# Patient Record
Sex: Male | Born: 1935 | Hispanic: No | Marital: Married | Smoking: Former smoker
Health system: Southern US, Community
[De-identification: ages and names within clinical notes are randomized; demographics above are authoritative.]

## PROBLEM LIST (undated history)

## (undated) ENCOUNTER — Emergency Department (HOSPITAL_COMMUNITY): Admission: EM | Discharge status: Absent without leave | Payer: Medicare Other

## (undated) DIAGNOSIS — M1990 Unspecified osteoarthritis, unspecified site: Secondary | ICD-10-CM

## (undated) DIAGNOSIS — G459 Transient cerebral ischemic attack, unspecified: Secondary | ICD-10-CM

## (undated) DIAGNOSIS — G4733 Obstructive sleep apnea (adult) (pediatric): Secondary | ICD-10-CM

## (undated) DIAGNOSIS — J449 Chronic obstructive pulmonary disease, unspecified: Secondary | ICD-10-CM

## (undated) DIAGNOSIS — J310 Chronic rhinitis: Secondary | ICD-10-CM

## (undated) DIAGNOSIS — B029 Zoster without complications: Secondary | ICD-10-CM

## (undated) DIAGNOSIS — E119 Type 2 diabetes mellitus without complications: Secondary | ICD-10-CM

## (undated) DIAGNOSIS — I2510 Atherosclerotic heart disease of native coronary artery without angina pectoris: Secondary | ICD-10-CM

## (undated) DIAGNOSIS — H903 Sensorineural hearing loss, bilateral: Secondary | ICD-10-CM

## (undated) DIAGNOSIS — J302 Other seasonal allergic rhinitis: Secondary | ICD-10-CM

## (undated) DIAGNOSIS — K219 Gastro-esophageal reflux disease without esophagitis: Secondary | ICD-10-CM

## (undated) DIAGNOSIS — K5790 Diverticulosis of intestine, part unspecified, without perforation or abscess without bleeding: Secondary | ICD-10-CM

## (undated) DIAGNOSIS — E785 Hyperlipidemia, unspecified: Secondary | ICD-10-CM

## (undated) DIAGNOSIS — K449 Diaphragmatic hernia without obstruction or gangrene: Secondary | ICD-10-CM

## (undated) DIAGNOSIS — N400 Enlarged prostate without lower urinary tract symptoms: Secondary | ICD-10-CM

## (undated) DIAGNOSIS — I639 Cerebral infarction, unspecified: Secondary | ICD-10-CM

## (undated) DIAGNOSIS — N3281 Overactive bladder: Secondary | ICD-10-CM

## (undated) DIAGNOSIS — I10 Essential (primary) hypertension: Secondary | ICD-10-CM

## (undated) DIAGNOSIS — D649 Anemia, unspecified: Secondary | ICD-10-CM

## (undated) HISTORY — DX: Unspecified osteoarthritis, unspecified site: M19.90

## (undated) HISTORY — DX: Diverticulosis of intestine, part unspecified, without perforation or abscess without bleeding: K57.90

## (undated) HISTORY — DX: Anemia, unspecified: D64.9

## (undated) HISTORY — PX: JOINT REPLACEMENT: SHX530

## (undated) HISTORY — DX: Atherosclerotic heart disease of native coronary artery without angina pectoris: I25.10

## (undated) HISTORY — DX: Chronic rhinitis: J31.0

## (undated) HISTORY — DX: Hyperlipidemia, unspecified: E78.5

## (undated) HISTORY — DX: Zoster without complications: B02.9

## (undated) HISTORY — PX: BACK SURGERY: SHX140

## (undated) HISTORY — DX: Obstructive sleep apnea (adult) (pediatric): G47.33

## (undated) HISTORY — DX: Overactive bladder: N32.81

## (undated) HISTORY — DX: Diaphragmatic hernia without obstruction or gangrene: K44.9

## (undated) HISTORY — DX: Benign prostatic hyperplasia without lower urinary tract symptoms: N40.0

## (undated) HISTORY — PX: OTHER SURGICAL HISTORY: SHX169

## (undated) HISTORY — DX: Type 2 diabetes mellitus without complications: E11.9

## (undated) HISTORY — DX: Chronic obstructive pulmonary disease, unspecified: J44.9

## (undated) HISTORY — DX: Sensorineural hearing loss, bilateral: H90.3

## (undated) HISTORY — DX: Transient cerebral ischemic attack, unspecified: G45.9

## (undated) HISTORY — DX: Gastro-esophageal reflux disease without esophagitis: K21.9

## (undated) HISTORY — DX: Other seasonal allergic rhinitis: J30.2

## (undated) HISTORY — PX: INNER EAR SURGERY: SHX679

## (undated) HISTORY — DX: Essential (primary) hypertension: I10

## (undated) HISTORY — DX: Cerebral infarction, unspecified: I63.9

---

## 2000-02-16 ENCOUNTER — Encounter: Admission: RE | Admit: 2000-02-16 | Discharge: 2000-02-16 | Payer: Self-pay | Admitting: Family Medicine

## 2000-02-16 ENCOUNTER — Encounter: Payer: Self-pay | Admitting: Family Medicine

## 2001-11-26 ENCOUNTER — Ambulatory Visit (HOSPITAL_COMMUNITY): Admission: RE | Admit: 2001-11-26 | Discharge: 2001-11-26 | Payer: Self-pay | Admitting: Orthopedic Surgery

## 2002-05-02 HISTORY — PX: OTHER SURGICAL HISTORY: SHX169

## 2002-08-19 ENCOUNTER — Ambulatory Visit: Admission: RE | Admit: 2002-08-19 | Discharge: 2002-08-19 | Payer: Self-pay | Admitting: Gastroenterology

## 2002-08-19 ENCOUNTER — Encounter: Payer: Self-pay | Admitting: Gastroenterology

## 2002-09-19 ENCOUNTER — Encounter: Admission: RE | Admit: 2002-09-19 | Discharge: 2002-09-19 | Payer: Self-pay | Admitting: Internal Medicine

## 2002-09-19 ENCOUNTER — Encounter (HOSPITAL_BASED_OUTPATIENT_CLINIC_OR_DEPARTMENT_OTHER): Payer: Self-pay | Admitting: Internal Medicine

## 2002-10-14 ENCOUNTER — Encounter: Payer: Self-pay | Admitting: Neurosurgery

## 2002-10-16 ENCOUNTER — Inpatient Hospital Stay (HOSPITAL_COMMUNITY): Admission: RE | Admit: 2002-10-16 | Discharge: 2002-10-18 | Payer: Self-pay | Admitting: Neurosurgery

## 2002-10-16 ENCOUNTER — Encounter: Payer: Self-pay | Admitting: Neurosurgery

## 2002-12-16 ENCOUNTER — Encounter (INDEPENDENT_AMBULATORY_CARE_PROVIDER_SITE_OTHER): Payer: Self-pay | Admitting: *Deleted

## 2002-12-16 ENCOUNTER — Ambulatory Visit (HOSPITAL_COMMUNITY): Admission: RE | Admit: 2002-12-16 | Discharge: 2002-12-16 | Payer: Self-pay | Admitting: Gastroenterology

## 2003-07-01 DIAGNOSIS — I2510 Atherosclerotic heart disease of native coronary artery without angina pectoris: Secondary | ICD-10-CM

## 2003-07-01 HISTORY — DX: Atherosclerotic heart disease of native coronary artery without angina pectoris: I25.10

## 2003-07-01 HISTORY — PX: CORONARY ANGIOPLASTY WITH STENT PLACEMENT: SHX49

## 2003-07-30 ENCOUNTER — Inpatient Hospital Stay (HOSPITAL_COMMUNITY): Admission: AD | Admit: 2003-07-30 | Discharge: 2003-08-01 | Payer: Self-pay | Admitting: Cardiovascular Disease

## 2004-05-02 HISTORY — PX: OTHER SURGICAL HISTORY: SHX169

## 2004-08-23 ENCOUNTER — Inpatient Hospital Stay (HOSPITAL_COMMUNITY): Admission: RE | Admit: 2004-08-23 | Discharge: 2004-08-27 | Payer: Self-pay | Admitting: Orthopedic Surgery

## 2004-12-31 ENCOUNTER — Encounter: Admission: RE | Admit: 2004-12-31 | Discharge: 2004-12-31 | Payer: Self-pay | Admitting: Internal Medicine

## 2006-07-12 ENCOUNTER — Ambulatory Visit: Payer: Self-pay | Admitting: Pulmonary Disease

## 2006-07-17 ENCOUNTER — Ambulatory Visit: Payer: Self-pay | Admitting: Pulmonary Disease

## 2006-07-17 ENCOUNTER — Ambulatory Visit (HOSPITAL_BASED_OUTPATIENT_CLINIC_OR_DEPARTMENT_OTHER): Admission: RE | Admit: 2006-07-17 | Discharge: 2006-07-17 | Payer: Self-pay | Admitting: Pulmonary Disease

## 2006-08-01 ENCOUNTER — Ambulatory Visit: Payer: Self-pay | Admitting: Pulmonary Disease

## 2006-08-02 ENCOUNTER — Ambulatory Visit: Payer: Self-pay | Admitting: Pulmonary Disease

## 2006-08-17 ENCOUNTER — Encounter (HOSPITAL_COMMUNITY): Admission: RE | Admit: 2006-08-17 | Discharge: 2006-11-15 | Payer: Self-pay | Admitting: Pulmonary Disease

## 2006-09-18 ENCOUNTER — Ambulatory Visit: Payer: Self-pay | Admitting: Pulmonary Disease

## 2006-11-09 ENCOUNTER — Ambulatory Visit: Payer: Self-pay | Admitting: Pulmonary Disease

## 2006-12-01 DIAGNOSIS — G459 Transient cerebral ischemic attack, unspecified: Secondary | ICD-10-CM

## 2006-12-01 HISTORY — DX: Transient cerebral ischemic attack, unspecified: G45.9

## 2006-12-06 ENCOUNTER — Encounter (HOSPITAL_BASED_OUTPATIENT_CLINIC_OR_DEPARTMENT_OTHER): Payer: Self-pay | Admitting: Internal Medicine

## 2006-12-06 ENCOUNTER — Inpatient Hospital Stay (HOSPITAL_COMMUNITY): Admission: AD | Admit: 2006-12-06 | Discharge: 2006-12-08 | Payer: Self-pay | Admitting: Internal Medicine

## 2006-12-07 ENCOUNTER — Encounter (HOSPITAL_BASED_OUTPATIENT_CLINIC_OR_DEPARTMENT_OTHER): Payer: Self-pay | Admitting: Internal Medicine

## 2007-01-17 ENCOUNTER — Ambulatory Visit: Payer: Self-pay | Admitting: Pulmonary Disease

## 2007-03-13 DIAGNOSIS — J449 Chronic obstructive pulmonary disease, unspecified: Secondary | ICD-10-CM | POA: Insufficient documentation

## 2007-03-13 DIAGNOSIS — G4733 Obstructive sleep apnea (adult) (pediatric): Secondary | ICD-10-CM | POA: Insufficient documentation

## 2007-04-02 ENCOUNTER — Ambulatory Visit: Payer: Self-pay | Admitting: Pulmonary Disease

## 2007-04-05 ENCOUNTER — Inpatient Hospital Stay (HOSPITAL_COMMUNITY): Admission: EM | Admit: 2007-04-05 | Discharge: 2007-04-06 | Payer: Self-pay | Admitting: Emergency Medicine

## 2007-07-16 ENCOUNTER — Ambulatory Visit: Payer: Self-pay | Admitting: Pulmonary Disease

## 2008-01-11 ENCOUNTER — Ambulatory Visit: Payer: Self-pay | Admitting: Pulmonary Disease

## 2009-03-31 DIAGNOSIS — D509 Iron deficiency anemia, unspecified: Secondary | ICD-10-CM | POA: Diagnosis present

## 2009-03-31 DIAGNOSIS — E782 Mixed hyperlipidemia: Secondary | ICD-10-CM | POA: Diagnosis present

## 2009-03-31 DIAGNOSIS — K219 Gastro-esophageal reflux disease without esophagitis: Secondary | ICD-10-CM | POA: Insufficient documentation

## 2009-06-09 ENCOUNTER — Ambulatory Visit: Payer: Self-pay | Admitting: Pulmonary Disease

## 2009-09-10 ENCOUNTER — Inpatient Hospital Stay (HOSPITAL_COMMUNITY): Admission: EM | Admit: 2009-09-10 | Discharge: 2009-09-12 | Payer: Self-pay | Admitting: Emergency Medicine

## 2009-09-11 ENCOUNTER — Encounter (INDEPENDENT_AMBULATORY_CARE_PROVIDER_SITE_OTHER): Payer: Self-pay | Admitting: Gastroenterology

## 2009-12-04 ENCOUNTER — Ambulatory Visit: Payer: Self-pay | Admitting: Surgery

## 2009-12-04 ENCOUNTER — Ambulatory Visit: Payer: Self-pay | Admitting: Pulmonary Disease

## 2010-05-27 ENCOUNTER — Ambulatory Visit: Payer: Self-pay | Admitting: Cardiovascular Disease

## 2010-06-01 NOTE — Assessment & Plan Note (Signed)
Summary: 6 month f/u ///kp   Primary Provider/Referring Provider:  Dr. Leanna Battles  CC:  6 month follow up, pt states breathing is doing good. pt states he has no cough, pt has no complaints today, Pt states he uses his cpap 2-3 days out of the week, 5-6 hours a night, and pt states he has no problems with cpap machine.  History of Present Illness: I saw [name] in f/u today for his severe COPD and moderate OSA on CPAP 7 cm H2O.  His breathing has been stable.  He has occasional cough with clear sputum.  He does not have much wheeze.  He still gets winded with strenuous exertion or bending.  He denies chest pain, fever, or leg swelling.  His breathing gets worse in hot weather.  He uses albuterol two times a day.  He remains on prednisone 2.5 mg once daily.  He has not been using CPAP on a consistent basis. He wants to know if he is a candidate for an oral appliance.  Preventive Screening-Counseling & Management  Alcohol-Tobacco     Smoking Status: quit     Packs/Day: 1.0     Year Started: 1945     Year Quit: 1990  Current Medications (verified): 1)  Symbicort 160-4.5 Mcg/act  Aero (Budesonide-Formoterol Fumarate) .... 2 Inhalations Two Times A Day 2)  Spiriva Handihaler 18 Mcg  Caps (Tiotropium Bromide Monohydrate) .Marland Kitchen.. 1 Capsule Once Daily 3)  Singulair 10 Mg  Tabs (Montelukast Sodium) .... Take 1 Tablet By Mouth Once A Day 4)  Prednisone 5 Mg  Tabs (Prednisone) .... 1/2 By Mouth Daily 5)  Theophylline Cr 200 Mg  Tb12 (Theophylline) .... Take 1 Tablet By Mouth Once A Day 6)  Loratadine 10 Mg  Tabs (Loratadine) .... Take 1 Tablet By Mouth Once A Day 7)  Albuterol Sulfate (2.5 Mg/42ml) 0.083%  Nebu (Albuterol Sulfate) .Marland Kitchen.. 1 Vial Two Times A Day 8)  Actos 15 Mg  Tabs (Pioglitazone Hcl) .... Take 1 Tablet By Mouth Once A Day 9)  Lipitor 20 Mg  Tabs (Atorvastatin Calcium) .... Take 1 Tablet By Mouth Once A Day 10)  Cardizem La 180 Mg  Tb24 (Diltiazem Hcl Coated Beads) .... Take 1  Tablet By Mouth Once A Day 11)  Furosemide 20 Mg  Tabs (Furosemide) .... Take 1 Tablet By Mouth Once A Day 12)  Ferrex 150 150 Mg  Caps (Polysaccharide Iron Complex) .... 2 Tabs Two Times A Day 13)  Metformin Hcl 500 Mg  Tb24 (Metformin Hcl) .... Take 1 Tablet By Mouth Once A Day 14)  Adult Aspirin Low Strength 81 Mg  Tbdp (Aspirin) .... Take 1 Tablet By Mouth Once A Day 15)  Meloxicam 15 Mg  Tabs (Meloxicam) .... Take 1 Tablet By Mouth Once A Day 16)  Nitrostat 0.4 Mg  Subl (Nitroglycerin) .... As Needed 17)  Plavix 75 Mg  Tabs (Clopidogrel Bisulfate) .Marland Kitchen.. 1 By Mouth Daily 18)  Micardis 80 Mg Tabs (Telmisartan) .Marland Kitchen.. 1 By Mouth Daily 19)  Centrum Silver  Tabs (Multiple Vitamins-Minerals) .Marland Kitchen.. 1 By Mouth Daily 20)  Pantoprazole Sodium 40 Mg Tbec (Pantoprazole Sodium) .Marland Kitchen.. 1 By Mouth Daily 21)  Vitamin C 500 Mg Tabs (Ascorbic Acid) .Marland Kitchen.. 1 By Mouth Daily 22)  Flomax 0.4 Mg Caps (Tamsulosin Hcl) .... Once Daily  Allergies: 1)  ! Sulfa  Past History:  Past Medical History: Reviewed history from 06/09/2009 and no changes required. TIA 08/08, Dr. Antony Contras OSA, PSG 03/08 AHI 23  COPD      - PFT 07/31/05 FEV1 1.30(54%), FVC 2.89(79%), FEV1% 45, TLC 4.98(91%), DLCO 55% CAD, s/p stent 2005, Dr. Mertie Moores  Diastolic dysfunction, Echo 07/08 EF 55 to 60% Rhinitis HTN DM DJD Dyslipidemia Hiatal hernia Hemorrhoids Diverticulosis Overactive bladder  Past Surgical History: Reviewed history from 01/11/2008 and no changes required. L5 laminectomy Dr. Sherwood Gambler 2004 Right total knee 2006, Dr. Wynelle Link  Social History: Packs/Day:  1.0  Vital Signs:  Patient profile:   77 year old male Height:      68 inches O2 Sat:      94 % on Room air Pulse rate:   91 / minute Resp:     97.7 per minute BP sitting:   122 / 48  Vitals Entered By: Charma Igo (December 04, 2009 1:41 PM)  O2 Flow:  Room air CC: 6 month follow up, pt states breathing is doing good. pt states he has no cough, pt has  no complaints today, Pt states he uses his cpap 2-3 days out of the week, 5-6 hours a night, pt states he has no problems with cpap machine Comments meds and allergies updated Phone number updated Charma Igo  December 04, 2009 1:42 PM    Physical Exam  General:  normal appearance and thin.   Nose:  no deformity, discharge, inflammation, or lesions Mouth:  no deformity or lesions Neck:  no LAN Lungs:  prolonged exhalation, no wheezing Heart:  regular rhythm and normal rate.   Extremities:  no edema   Impression & Recommendations:  Problem # 1:  CHRONIC OBSTRUCTIVE PULMONARY DISEASE, SEVERE (ICD-496) He is stable on his current regimen.  Will see if he can taper off prednisone.  If he has more problems with his breathing off prednisone, then I have advised he could try using daliresp (roflumilast) first.  He is to continue his other inhaler regimen.   Problem # 2:  OBSTRUCTIVE SLEEP APNEA (ICD-327.23) He has not been able to adjust to CPAP therapy.  He will check with his dentist to see if he is a candidate for an oral appliance.  I advised him that he will need to have further sleep testing after he is fitted for his oral appliance to document efficacy of the device.  Medications Added to Medication List This Visit: 1)  Prednisone 5 Mg Tabs (Prednisone) .... 1/2 by mouth every other day for 2 weeks, then stop prednisone 2)  Flomax 0.4 Mg Caps (Tamsulosin hcl) .... Once daily  Complete Medication List: 1)  Symbicort 160-4.5 Mcg/act Aero (Budesonide-formoterol fumarate) .... 2 inhalations two times a day 2)  Spiriva Handihaler 18 Mcg Caps (Tiotropium bromide monohydrate) .Marland Kitchen.. 1 capsule once daily 3)  Singulair 10 Mg Tabs (Montelukast sodium) .... Take 1 tablet by mouth once a day 4)  Prednisone 5 Mg Tabs (Prednisone) .... 1/2 by mouth every other day for 2 weeks, then stop prednisone 5)  Theophylline Cr 200 Mg Tb12 (Theophylline) .... Take 1 tablet by mouth once a day 6)  Loratadine 10  Mg Tabs (Loratadine) .... Take 1 tablet by mouth once a day 7)  Albuterol Sulfate (2.5 Mg/92ml) 0.083% Nebu (Albuterol sulfate) .Marland Kitchen.. 1 vial two times a day 8)  Actos 15 Mg Tabs (Pioglitazone hcl) .... Take 1 tablet by mouth once a day 9)  Lipitor 20 Mg Tabs (Atorvastatin calcium) .... Take 1 tablet by mouth once a day 10)  Cardizem La 180 Mg Tb24 (Diltiazem hcl coated beads) .... Take 1  tablet by mouth once a day 11)  Furosemide 20 Mg Tabs (Furosemide) .... Take 1 tablet by mouth once a day 12)  Ferrex 150 150 Mg Caps (Polysaccharide iron complex) .... 2 tabs two times a day 13)  Metformin Hcl 500 Mg Tb24 (Metformin hcl) .... Take 1 tablet by mouth once a day 14)  Adult Aspirin Low Strength 81 Mg Tbdp (Aspirin) .... Take 1 tablet by mouth once a day 15)  Meloxicam 15 Mg Tabs (Meloxicam) .... Take 1 tablet by mouth once a day 16)  Nitrostat 0.4 Mg Subl (Nitroglycerin) .... As needed 17)  Plavix 75 Mg Tabs (Clopidogrel bisulfate) .Marland Kitchen.. 1 by mouth daily 18)  Micardis 80 Mg Tabs (Telmisartan) .Marland Kitchen.. 1 by mouth daily 19)  Centrum Silver Tabs (Multiple vitamins-minerals) .Marland Kitchen.. 1 by mouth daily 20)  Pantoprazole Sodium 40 Mg Tbec (Pantoprazole sodium) .Marland Kitchen.. 1 by mouth daily 21)  Vitamin C 500 Mg Tabs (Ascorbic acid) .Marland Kitchen.. 1 by mouth daily 22)  Flomax 0.4 Mg Caps (Tamsulosin hcl) .... Once daily  Other Orders: Est. Patient Level III [id number])  Patient Instructions: 1)  check with your dentist about an oral appliance (mandibular advancement device) for sleep apnea 2)  Change prednisone to 2.5 mg every other day for 2 weeks, and if okay then stop prednisone 3)  Will decide if you are a candidate for Daliresp for treatment of you COPD 4)  Follow up in 6 months

## 2010-06-01 NOTE — Assessment & Plan Note (Signed)
Summary: rov/apc   Primary Provider/Referring Provider:  Dr. Leanna Battles  CC:  COPD. OSA. The patient states he is having no breathing problems..  History of Present Illness: I saw [name] in f/u today for his severe COPD and moderate OSA on CPAP 7 cm H2O.  I have not seen [name] since 2009.  He continues on his regimen of prednisone and theophylline with his inhalers.  He has been doing reasonably well.  He has occasional cough with clear sputum.  He gets occasional wheeze.  His breathing is usually not much of a problem, but he will get winded with heavy exertion.  He uses his CPAP a few days per week.  He will sometimes forget to use it, and will sometimes take it off in the middle of the night.  He is using a full face mask.  He sometimes feels the pressure is too much.   Current Medications (verified): 1)  Symbicort 160-4.5 Mcg/act  Aero (Budesonide-Formoterol Fumarate) .... 2 Inhalations Two Times A Day 2)  Spiriva Handihaler 18 Mcg  Caps (Tiotropium Bromide Monohydrate) .Marland Kitchen.. 1 Capsule Once Daily 3)  Singulair 10 Mg  Tabs (Montelukast Sodium) .... Take 1 Tablet By Mouth Once A Day 4)  Loratadine 10 Mg  Tabs (Loratadine) .... Take 1 Tablet By Mouth Once A Day 5)  Albuterol Sulfate (2.5 Mg/33ml) 0.083%  Nebu (Albuterol Sulfate) .Marland Kitchen.. 1 Vial Two Times A Day 6)  Theophylline Cr 200 Mg  Tb12 (Theophylline) .... Take 1 Tablet By Mouth Once A Day 7)  Prednisone 5 Mg  Tabs (Prednisone) .... 1/2 By Mouth Daily 8)  Actos 15 Mg  Tabs (Pioglitazone Hcl) .... Take 1 Tablet By Mouth Once A Day 9)  Lipitor 20 Mg  Tabs (Atorvastatin Calcium) .... Take 1 Tablet By Mouth Once A Day 10)  Cardizem La 180 Mg  Tb24 (Diltiazem Hcl Coated Beads) .... Take 1 Tablet By Mouth Once A Day 11)  Furosemide 20 Mg  Tabs (Furosemide) .... Take 1 Tablet By Mouth Once A Day 12)  Ferrex 150 150 Mg  Caps (Polysaccharide Iron Complex) .... 2 Tabs Two Times A Day 13)  Metformin Hcl 500 Mg  Tb24 (Metformin Hcl) ....  Take 1 Tablet By Mouth Once A Day 14)  Adult Aspirin Low Strength 81 Mg  Tbdp (Aspirin) .... Take 1 Tablet By Mouth Once A Day 15)  Meloxicam 15 Mg  Tabs (Meloxicam) .... Take 1 Tablet By Mouth Once A Day 16)  Metoclopramide Hcl 5 Mg  Tabs (Metoclopramide Hcl) .... Take 1 Tab By Mouth At Bedtime 17)  Nitrostat 0.4 Mg  Subl (Nitroglycerin) .... As Needed 18)  Plavix 75 Mg  Tabs (Clopidogrel Bisulfate) .Marland Kitchen.. 1 By Mouth Daily 19)  Micardis 80 Mg Tabs (Telmisartan) .Marland Kitchen.. 1 By Mouth Daily 20)  Centrum Silver  Tabs (Multiple Vitamins-Minerals) .Marland Kitchen.. 1 By Mouth Daily 21)  Pantoprazole Sodium 40 Mg Tbec (Pantoprazole Sodium) .Marland Kitchen.. 1 By Mouth Daily 22)  Avodart 0.5 Mg Caps (Dutasteride) .Marland Kitchen.. 1 By Mouth Daily 23)  Vitamin C 500 Mg Tabs (Ascorbic Acid) .Marland Kitchen.. 1 By Mouth Daily  Allergies (verified): 1)  ! Sulfa  Past History:  Past Medical History: TIA 08/08, Dr. Antony Contras OSA, PSG 03/08 AHI 23 COPD      - PFT 07/31/05 FEV1 1.30(54%), FVC 2.89(79%), FEV1% 45, TLC 4.98(91%), DLCO 55% CAD, s/p stent 2005, Dr. Mertie Moores  Diastolic dysfunction, Echo 07/08 EF 55 to 60% Rhinitis HTN DM DJD Dyslipidemia Hiatal hernia  Hemorrhoids Diverticulosis Overactive bladder  Past Surgical History: Reviewed history from 01/11/2008 and no changes required. L5 laminectomy Dr. Sherwood Gambler 2004 Right total knee 2006, Dr. Wynelle Link  Vital Signs:  Patient profile:   77 year old male Height:      68 inches (172.72 cm) Weight:      162 pounds (73.64 kg) BMI:     24.72 O2 Sat:      95 % on Room air Temp:     98.0 degrees F (36.67 degrees C) oral Pulse rate:   75 / minute BP sitting:   114 / 68  (left arm) Cuff size:   regular  Vitals Entered By: Francesca Jewett CMA (June 09, 2009 4:21 PM)  O2 Sat at Rest %:  95 O2 Flow:  Room air  Physical Exam  General:  normal appearance and thin.   Nose:  no deformity, discharge, inflammation, or lesions Mouth:  no deformity or lesions Neck:  no LAN Lungs:   prolonged exhalation, no wheezing Heart:  regular rhythm and normal rate.   Abdomen:  soft Extremities:  no edema   Impression & Recommendations:  Problem # 1:  CHRONIC OBSTRUCTIVE PULMONARY DISEASE, SEVERE (ICD-496) He is doing well on his current inhaler regimen with chronic prednisone and theophylline.  He reports having a recent chest xray from his primary.  I have asked him to forward a copy for my review.  Problem # 2:  OBSTRUCTIVE SLEEP APNEA (ICD-327.23) Will arrange for auto titration to determine if he needs adjustment in his pressure setting.  Medications Added to Medication List This Visit: 1)  Prednisone 5 Mg Tabs (Prednisone) .... 1/2 by mouth daily 2)  Micardis 80 Mg Tabs (Telmisartan) .Marland Kitchen.. 1 by mouth daily 3)  Centrum Silver Tabs (Multiple vitamins-minerals) .Marland Kitchen.. 1 by mouth daily 4)  Pantoprazole Sodium 40 Mg Tbec (Pantoprazole sodium) .Marland Kitchen.. 1 by mouth daily 5)  Avodart 0.5 Mg Caps (Dutasteride) .Marland Kitchen.. 1 by mouth daily 6)  Vitamin C 500 Mg Tabs (Ascorbic acid) .Marland Kitchen.. 1 by mouth daily  Complete Medication List: 1)  Symbicort 160-4.5 Mcg/act Aero (Budesonide-formoterol fumarate) .... 2 inhalations two times a day 2)  Spiriva Handihaler 18 Mcg Caps (Tiotropium bromide monohydrate) .Marland Kitchen.. 1 capsule once daily 3)  Singulair 10 Mg Tabs (Montelukast sodium) .... Take 1 tablet by mouth once a day 4)  Prednisone 5 Mg Tabs (Prednisone) .... 1/2 by mouth daily 5)  Theophylline Cr 200 Mg Tb12 (Theophylline) .... Take 1 tablet by mouth once a day 6)  Loratadine 10 Mg Tabs (Loratadine) .... Take 1 tablet by mouth once a day 7)  Albuterol Sulfate (2.5 Mg/63ml) 0.083% Nebu (Albuterol sulfate) .Marland Kitchen.. 1 vial two times a day 8)  Actos 15 Mg Tabs (Pioglitazone hcl) .... Take 1 tablet by mouth once a day 9)  Lipitor 20 Mg Tabs (Atorvastatin calcium) .... Take 1 tablet by mouth once a day 10)  Cardizem La 180 Mg Tb24 (Diltiazem hcl coated beads) .... Take 1 tablet by mouth once a day 11)  Furosemide  20 Mg Tabs (Furosemide) .... Take 1 tablet by mouth once a day 12)  Ferrex 150 150 Mg Caps (Polysaccharide iron complex) .... 2 tabs two times a day 13)  Metformin Hcl 500 Mg Tb24 (Metformin hcl) .... Take 1 tablet by mouth once a day 14)  Adult Aspirin Low Strength 81 Mg Tbdp (Aspirin) .... Take 1 tablet by mouth once a day 15)  Meloxicam 15 Mg Tabs (Meloxicam) .... Take 1 tablet  by mouth once a day 16)  Metoclopramide Hcl 5 Mg Tabs (Metoclopramide hcl) .... Take 1 tab by mouth at bedtime 17)  Nitrostat 0.4 Mg Subl (Nitroglycerin) .... As needed 18)  Plavix 75 Mg Tabs (Clopidogrel bisulfate) .Marland Kitchen.. 1 by mouth daily 19)  Micardis 80 Mg Tabs (Telmisartan) .Marland Kitchen.. 1 by mouth daily 20)  Centrum Silver Tabs (Multiple vitamins-minerals) .Marland Kitchen.. 1 by mouth daily 21)  Pantoprazole Sodium 40 Mg Tbec (Pantoprazole sodium) .Marland Kitchen.. 1 by mouth daily 22)  Avodart 0.5 Mg Caps (Dutasteride) .Marland Kitchen.. 1 by mouth daily 23)  Vitamin C 500 Mg Tabs (Ascorbic acid) .Marland Kitchen.. 1 by mouth daily  Other Orders: Est. Patient Level III [id number]) DME Referral (DME)  Patient Instructions: 1)  Will check CPAP pressure  2)  Follow up in 6 months   Immunization History:  Influenza Immunization History:    Influenza:  historical (02/13/2009)  Pneumovax Immunization History:    Pneumovax:  historical (02/13/2009)

## 2010-06-17 ENCOUNTER — Ambulatory Visit (INDEPENDENT_AMBULATORY_CARE_PROVIDER_SITE_OTHER): Payer: Medicare Other | Admitting: Pulmonary Disease

## 2010-06-17 ENCOUNTER — Telehealth (INDEPENDENT_AMBULATORY_CARE_PROVIDER_SITE_OTHER): Payer: Self-pay | Admitting: *Deleted

## 2010-06-17 ENCOUNTER — Encounter: Payer: Self-pay | Admitting: Pulmonary Disease

## 2010-06-17 DIAGNOSIS — J449 Chronic obstructive pulmonary disease, unspecified: Secondary | ICD-10-CM

## 2010-06-17 DIAGNOSIS — G4733 Obstructive sleep apnea (adult) (pediatric): Secondary | ICD-10-CM

## 2010-06-18 ENCOUNTER — Encounter: Payer: Self-pay | Admitting: Pulmonary Disease

## 2010-06-21 ENCOUNTER — Telehealth: Payer: Self-pay | Admitting: Pulmonary Disease

## 2010-06-23 NOTE — Progress Notes (Signed)
Summary: cpap supplier  Phone Note Call from Patient Call back at Home Phone [phone]   Caller: Patient Call For: sood Summary of Call: pt called w/ information for dr sood's nurse. he says that he uses [place] [facility] (cpap supplier). no call back needed per pt- just FYI as requested Initial call taken by: Cooper Render, CNA,  June 17, 2010 3:12 PM  Follow-up for Phone Call        info for dr sood    Maryann Conners [facility]  June 17, 2010 4:26 PM   Additional Follow-up for Phone Call Additional follow up Details #1::        Will send order to [facility] to arrange for CPAP mask fit and get CPAP download. Additional Follow-up by: [name] MD,  June 17, 2010 4:36 PM    Additional Follow-up for Phone Call Additional follow up Details #2::    Called, spoke with pt.  he is aware of above per VS and verbalized understanding. Follow-up by: Raymondo Band RN,  June 17, 2010 5:08 PM

## 2010-06-23 NOTE — Assessment & Plan Note (Signed)
Summary: rov//kp   Primary Provider/Referring Provider:  Dr. Leanna Battles  CC:  Follow up, c/o prod cough yellow in am, increase sob worse on am , and cpap compliant averages 5-6 hrs per night.  History of Present Illness: 77 yo male with severe COPD and moderate OSA on CPAP 7 cm H2O.  He has been using daliresp for about one month.  He initially had some trouble with diarrhea, but this resolved.  He feels this has helped.  He still has cough with clear to yellow sputum.  His wife says he is wheezing more.  He gets winded with strenuous exertion, but is okay at an even pace.  He does not do much activity.  He is using CPAP more.  He is not sure if he is getting enough pressure.  He has not received new supplies for some time.  He has been getting itching, and saw dermatology for this.   Preventive Screening-Counseling & Management  Alcohol-Tobacco     Smoking Status: quit     Packs/Day: 1.0     Year Started: 1945     Year Quit: 1990     Pack years: 23  Current Medications (verified): 1)  Symbicort 160-4.5 Mcg/act  Aero (Budesonide-Formoterol Fumarate) .... 2 Inhalations Two Times A Day 2)  Spiriva Handihaler 18 Mcg  Caps (Tiotropium Bromide Monohydrate) .Marland Kitchen.. 1 Capsule Once Daily 3)  Singulair 10 Mg  Tabs (Montelukast Sodium) .... Take 1 Tablet By Mouth Once A Day 4)  Loratadine 10 Mg  Tabs (Loratadine) .... Take 1 Tablet By Mouth Once A Day 5)  Albuterol Sulfate (2.5 Mg/45ml) 0.083%  Nebu (Albuterol Sulfate) .Marland Kitchen.. 1 Vial Two Times A Day 6)  Actos 15 Mg  Tabs (Pioglitazone Hcl) .... Take 1 Tablet By Mouth Once A Day 7)  Lipitor 20 Mg  Tabs (Atorvastatin Calcium) .... Take 1 Tablet By Mouth Once A Day 8)  Cardizem La 180 Mg  Tb24 (Diltiazem Hcl Coated Beads) .... Take 1 Tablet By Mouth Once A Day 9)  Furosemide 20 Mg  Tabs (Furosemide) .... Take 1 Tablet By Mouth Once A Day 10)  Ferrex 150 150 Mg  Caps (Polysaccharide Iron Complex) .... 2 Tabs Two Times A Day 11)  Adult Aspirin  Low Strength 81 Mg  Tbdp (Aspirin) .... Take 1 Tablet By Mouth Once A Day 12)  Nitrostat 0.4 Mg  Subl (Nitroglycerin) .... As Needed 13)  Plavix 75 Mg  Tabs (Clopidogrel Bisulfate) .Marland Kitchen.. 1 By Mouth Daily 14)  Micardis 80 Mg Tabs (Telmisartan) .Marland Kitchen.. 1 By Mouth Daily 15)  Centrum Silver  Tabs (Multiple Vitamins-Minerals) .Marland Kitchen.. 1 By Mouth Daily 16)  Pantoprazole Sodium 40 Mg Tbec (Pantoprazole Sodium) .Marland Kitchen.. 1 By Mouth Daily 17)  Vitamin C 500 Mg Tabs (Ascorbic Acid) .Marland Kitchen.. 1 By Mouth Daily 18)  Flomax 0.4 Mg Caps (Tamsulosin Hcl) .... Once Daily 19)  Daliresp 500 Mcg Tabs (Roflumilast) .Marland Kitchen.. 1 Once Daily 20)  Janumet 50-500 Mg Tabs (Sitagliptin-Metformin Hcl) .Marland Kitchen.. 1 Two Times A Day  Allergies (verified): 1)  ! Sulfa  Past History:  Past Medical History: Reviewed history from 06/09/2009 and no changes required. TIA 08/08, Dr. Antony Contras OSA, PSG 03/08 AHI 23 COPD      - PFT 07/31/05 FEV1 1.30(54%), FVC 2.89(79%), FEV1% 45, TLC 4.98(91%), DLCO 55% CAD, s/p stent 2005, Dr. Mertie Moores  Diastolic dysfunction, Echo 07/08 EF 55 to 60% Rhinitis HTN DM DJD Dyslipidemia Hiatal hernia Hemorrhoids Diverticulosis Overactive bladder  Past Surgical History: Reviewed  history from 01/11/2008 and no changes required. L5 laminectomy Dr. Sherwood Gambler 2004 Right total knee 2006, Dr. Wynelle Link  Vital Signs:  Patient profile:   77 year old male Height:      67 inches Weight:      147 pounds BMI:     23.11 O2 Sat:      95 % on Room air Temp:     97.9 degrees F oral Pulse rate:   93 / minute BP sitting:   104 / 48  (left arm)  Vitals Entered By: Stefani Dama RCP, LPN (February 16, [id number] 2:11 PM)  O2 Flow:  Room air CC: Follow up, c/o prod cough yellow in am, increase sob worse on am , cpap compliant averages 5-6 hrs per night Comments Medications reviewed with patient Stefani Dama RCP, LPN  February 16, [id number] 2:14 PM    Physical Exam  General:  normal appearance and thin.   Nose:  no  deformity, discharge, inflammation, or lesions Mouth:  no deformity or lesions Neck:  no JVD.   Lungs:  prolonged exhalation, b/l expiratory wheezing Heart:  regular rhythm and normal rate.   Extremities:  no edema Neurologic:  normal CN II-XII.   Cervical Nodes:  no significant adenopathy   Impression & Recommendations:  Problem # 1:  CHRONIC OBSTRUCTIVE PULMONARY DISEASE, SEVERE (ICD-496) He has recurrent symptoms of wheezing, dyspnea, and sputum production.  I will give him an additional taper of prednisone, and then determine whether he needs to restart chronic low dose prednisone again.  He is to continue daliresp.  He may need to change from symbicort to nebulized budesonide if he continues to have trouble.  He is to continue with spiriva.  Problem # 2:  OBSTRUCTIVE SLEEP APNEA (ICD-327.23)  He will contact me about which home care company he gets his CPAP supplies from.  Will arrange for mask refit, and get CPAP download.  Will then decide if he needs to have adjustment to his CPAP settings.  Medications Added to Medication List This Visit: 1)  Daliresp 500 Mcg Tabs (Roflumilast) .Marland Kitchen.. 1 once daily 2)  Janumet 50-500 Mg Tabs (Sitagliptin-metformin hcl) .Marland Kitchen.. 1 two times a day 3)  Prednisone 5 Mg Tabs (Prednisone) .... 4 pills for 2 days, 3 pills for 2 days, 2 pills for 2 days, 1 pill for 2 days, then 1 pill every other day for 4 doses  Complete Medication List: 1)  Symbicort 160-4.5 Mcg/act Aero (Budesonide-formoterol fumarate) .... 2 inhalations two times a day 2)  Spiriva Handihaler 18 Mcg Caps (Tiotropium bromide monohydrate) .Marland Kitchen.. 1 capsule once daily 3)  Singulair 10 Mg Tabs (Montelukast sodium) .... Take 1 tablet by mouth once a day 4)  Loratadine 10 Mg Tabs (Loratadine) .... Take 1 tablet by mouth once a day 5)  Albuterol Sulfate (2.5 Mg/54ml) 0.083% Nebu (Albuterol sulfate) .Marland Kitchen.. 1 vial two times a day 6)  Actos 15 Mg Tabs (Pioglitazone hcl) .... Take 1 tablet by mouth once a  day 7)  Lipitor 20 Mg Tabs (Atorvastatin calcium) .... Take 1 tablet by mouth once a day 8)  Cardizem La 180 Mg Tb24 (Diltiazem hcl coated beads) .... Take 1 tablet by mouth once a day 9)  Furosemide 20 Mg Tabs (Furosemide) .... Take 1 tablet by mouth once a day 10)  Ferrex 150 150 Mg Caps (Polysaccharide iron complex) .... 2 tabs two times a day 11)  Adult Aspirin Low Strength 81 Mg Tbdp (Aspirin) .... Take 1 tablet by mouth  once a day 12)  Nitrostat 0.4 Mg Subl (Nitroglycerin) .... As needed 13)  Plavix 75 Mg Tabs (Clopidogrel bisulfate) .Marland Kitchen.. 1 by mouth daily 14)  Micardis 80 Mg Tabs (Telmisartan) .Marland Kitchen.. 1 by mouth daily 15)  Centrum Silver Tabs (Multiple vitamins-minerals) .Marland Kitchen.. 1 by mouth daily 16)  Pantoprazole Sodium 40 Mg Tbec (Pantoprazole sodium) .Marland Kitchen.. 1 by mouth daily 17)  Vitamin C 500 Mg Tabs (Ascorbic acid) .Marland Kitchen.. 1 by mouth daily 18)  Flomax 0.4 Mg Caps (Tamsulosin hcl) .... Once daily 19)  Daliresp 500 Mcg Tabs (Roflumilast) .Marland Kitchen.. 1 once daily 20)  Janumet 50-500 Mg Tabs (Sitagliptin-metformin hcl) .Marland Kitchen.. 1 two times a day 21)  Prednisone 5 Mg Tabs (Prednisone) .... 4 pills for 2 days, 3 pills for 2 days, 2 pills for 2 days, 1 pill for 2 days, then 1 pill every other day for 4 doses  Other Orders: Est. Patient Level III [id number])  Patient Instructions: 1)  Prednisone 5 mg pills: 4 pills for 2 days, 3 pills for 2 days, 2 pills for 2 days, 1 pill for 2 days, then 1 pill every other day for 4 doses, then stop prednisone 2)  Call with name of your home care company for your CPAP machine 3)  Follow up in 6 months

## 2010-06-29 NOTE — Progress Notes (Signed)
Summary: Need new DME for CPAP  Phone Note From Other Clinic   Summary of Call: Received message from [facility] Patient.  [name] is no longer a patient with their service.  Will place a request to have him re-established with a DME for his OSA. Initial call taken by: [name] MD,  June 21, 2010 5:01 PM

## 2010-07-07 ENCOUNTER — Encounter: Payer: Self-pay | Admitting: Pulmonary Disease

## 2010-07-13 NOTE — Letter (Signed)
Summary: CMN for CPAP Supplies/[facility]  CMN for CPAP Supplies/[facility]   Imported By: Phillis Knack 07/09/2010 11:15:45  _____________________________________________________________________  External Attachment:    Type:   Image     Comment:   External Document

## 2010-07-20 LAB — CBC
Hemoglobin: 12.3 g/dL — ABNORMAL LOW (ref 13.0–17.0)
Hemoglobin: 14.2 g/dL (ref 13.0–17.0)
MCHC: 33.8 g/dL (ref 30.0–36.0)
MCHC: 33.9 g/dL (ref 30.0–36.0)
MCV: 83.7 fL (ref 78.0–100.0)
RBC: 4.32 MIL/uL (ref 4.22–5.81)
RBC: 5.02 MIL/uL (ref 4.22–5.81)
RDW: 13.9 % (ref 11.5–15.5)
RDW: 14 % (ref 11.5–15.5)
RDW: 14.2 % (ref 11.5–15.5)
WBC: 16.8 10*3/uL — ABNORMAL HIGH (ref 4.0–10.5)

## 2010-07-20 LAB — DIFFERENTIAL
Basophils Absolute: 0.1 10*3/uL (ref 0.0–0.1)
Basophils Relative: 1 % (ref 0–1)
Lymphocytes Relative: 4 % — ABNORMAL LOW (ref 12–46)
Monocytes Relative: 5 % (ref 3–12)
Neutro Abs: 14.1 10*3/uL — ABNORMAL HIGH (ref 1.7–7.7)
Neutrophils Relative %: 84 % — ABNORMAL HIGH (ref 43–77)

## 2010-07-20 LAB — COMPREHENSIVE METABOLIC PANEL
Alkaline Phosphatase: 90 U/L (ref 39–117)
Calcium: 10.2 mg/dL (ref 8.4–10.5)
Chloride: 100 mEq/L (ref 96–112)
Creatinine, Ser: 1.42 mg/dL (ref 0.4–1.5)
Glucose, Bld: 161 mg/dL — ABNORMAL HIGH (ref 70–99)
Potassium: 3.9 mEq/L (ref 3.5–5.1)
Sodium: 136 mEq/L (ref 135–145)
Total Bilirubin: 0.7 mg/dL (ref 0.3–1.2)
Total Protein: 6.7 g/dL (ref 6.0–8.3)

## 2010-07-20 LAB — BASIC METABOLIC PANEL
BUN: 15 mg/dL (ref 6–23)
CO2: 29 mEq/L (ref 19–32)
Calcium: 9.1 mg/dL (ref 8.4–10.5)
Creatinine, Ser: 1.03 mg/dL (ref 0.4–1.5)
GFR calc non Af Amer: >60 mL/min (ref >60)
Glucose, Bld: 171 mg/dL — ABNORMAL HIGH (ref 70–99)

## 2010-07-20 LAB — GLUCOSE, CAPILLARY
Glucose-Capillary: 118 mg/dL — ABNORMAL HIGH (ref 70–99)
Glucose-Capillary: 136 mg/dL — ABNORMAL HIGH (ref 70–99)
Glucose-Capillary: 154 mg/dL — ABNORMAL HIGH (ref 70–99)
Glucose-Capillary: 172 mg/dL — ABNORMAL HIGH (ref 70–99)
Glucose-Capillary: 187 mg/dL — ABNORMAL HIGH (ref 70–99)

## 2010-07-20 LAB — URINALYSIS, ROUTINE W REFLEX MICROSCOPIC
Hgb urine dipstick: NEGATIVE
Nitrite: NEGATIVE
Protein, ur: NEGATIVE mg/dL
Urobilinogen, UA: 0.2 mg/dL (ref 0.0–1.0)

## 2010-07-20 LAB — TYPE AND SCREEN
ABO/RH(D): B POS
Antibody Screen: NEGATIVE

## 2010-07-20 LAB — POCT CARDIAC MARKERS

## 2010-09-14 NOTE — Assessment & Plan Note (Signed)
[facility]                             PULMONARY OFFICE NOTE   NAME:AVVADerec, Herz                        MRN:          [id number]  DATE:01/17/2007                            DOB:          1928-07-07    I saw Mr. Mcgranahan today in followup for his moderate obstructive sleep  apnea and severe COPD.   Since his last visit with me he had what sounds like a TIA. He was  evaluated by Dr. Antony Contras for this and his workup per the patient  was essentially unremarkable for this. He says that his breathing is  reasonably stable at the present time although he was started on  prednisone at 5 mg a day recently. He continues to use his Symbicort,  Spiriva, Singulair, and theophylline as well as his nebulizer. He says  that his breathing seems to be worse in the morning. He uses his Spiriva  in the morning and he uses his nebulizers 3 times a day with the last  one being before he goes to bed. He also uses his Symbicort twice a day  with his last 2 puffs being before he goes to bed. With regards to his  sleep apnea, he says this does help some although sometimes he will wake  up in the middle of the night and the mask has come off, although he is  not sure exactly why that is. Otherwise, he does not have any problems  using his CPAP mask. He says his baseline peak flows are usually around  300 although last week they were running close to the 220 to 240.   CURRENT MEDICATIONS:  1. Albuterol nebulizer t.i.d.  2. Symbicort 160/4.5 two puffs b.i.d.  3. Spiriva 1 puff daily.  4. Actos 50 mg daily.  5. Singulair 10 mg daily.  6. Benicar 40 mg daily.  7. Lipitor 20 mg daily.  8. Aggrenox 25/200 q.h.s.  9. Loratadine 10 mg daily.  10.Cardizem LA 180 mg daily.  11.Lasix 20 mg daily.  12.Ferrex 300 mg daily.  13.Vitamin C 500 mg daily.  14.Theophylline 200 mg daily.  15.Centrum Silver once daily.  16.Metformin 500 mg daily.  17.Vesicare 5 mg daily.  18.Aspirin  81 mg daily.  19.Meloxicam.  20.Reglan 5 mg q.h.s.   PHYSICAL EXAMINATION:  VITAL SIGNS:  He is 149 pounds, temperature 98.1,  blood pressure 120/68, heart rate is 73. Oxygen saturation is 97% on  room air.  HEENT:  There is no sinus tenderness, no oral lesions.  HEART:  S1, S2.  CHEST:  Prolonged expiratory phase but no wheezing or rales.  ABDOMEN:  Soft, nontender,  EXTREMITIES:  No edema.   IMPRESSION:  1. Severe chronic obstructive pulmonary disease. He is essentially on      maximal medical therapy. He may also have an asthmatic component,      possibly with underlying allergic component. To further assess      this, I suggested that he undergo further allergy testing with a      RAST test as well as an IgE and a  CBC with differential. He would      like to have this arranged through his primary care physician.      Depending upon the results of this, it may be possible that he      could benefit from the addition of Xolair to his pulmonary regimen.      Otherwise, I am not sure if there is any further additional      therapies that would be of benefit for him. What I have also      advised him to do is try and see if he can alter the dose schedule      for his inhaler regimen to see if this helps better with his      nocturnal and early morning symptoms. I have also advised him that      he could try using his prednisone at 5 mg every other day to see if      he can maintain his symptoms status while reducing the adverse side      effects from his prednisone.  2. Obstructive sleep apnea. I have encouraged him to maintain his      compliance with his CPAP machine. I have also asked him to check to      see if he notices that his breathing is worse on the nights that      the CPAP machine comes off in the middle of the night and if this      is the case then many of his morning breathing problems may      actually be related to his sleep apnea.   I will followup with him in  approximately 2 months.     [name], MD  Electronically Signed    VS/MedQ  DD: 01/17/2007  DT: 01/18/2007  Job #: [id number]   cc:   Ermalene Searing. Philip Aspen, M.D.

## 2010-09-14 NOTE — Assessment & Plan Note (Signed)
[facility]                             PULMONARY OFFICE NOTE   NAME:AVVABuckley, Segovia                          MRN:          [id number]  DATE:09/05/2006                            DOB:          1928-06-18    I received the auto CPAP download for Mr. Kras from [facility]  Patient, this showed that he had an average daily usage of about four-  and-a-half hours with 17/18 days with use of his machine and that his  95th percentile pressures setting was 8 with the reduction in his apnea-  hypopnea index to 2.8.  I will therefore establish him on a fixed  pressure setting for his CPAP at 7, and then follow up with him to  monitor his clinical response.     [name], MD  Electronically Signed    VS/MedQ  DD: 09/05/2006  DT: 09/05/2006  Job #: [id number]   cc:   Ermalene Searing. Philip Aspen, M.D.

## 2010-09-14 NOTE — H&P (Signed)
[name], [name]                 ACCOUNT NO.:  [id number]   MEDICAL RECORD NO.:  [id number]          PATIENT TYPE:  INP   LOCATION:  [id number]                         FACILITY:  [facility]   PHYSICIAN:  Thayer Headings, M.D. DATE OF BIRTH:  [birth year]   DATE OF ADMISSION:  04/05/2007  DATE OF DISCHARGE:                              HISTORY & PHYSICAL   [name] is a 77 year old gentleman with a history of coronary artery  disease.  He is status post PTCA and stenting of his right coronary  artery.  He is admitted with episodes of chest pain.   [name] has a history of CAD and is status post PTCA and stenting of  his right coronary artery 3 years ago.  He had a stress Cardiolite study  last year which was unremarkable.   He had some intermittent episodes of chest discomfort, but they have all  been fairly atypical.  This morning he had sudden development of chest  pressure while eating.  The pain lasted for about 15 minutes.  He took  an old nitroglycerin, but did not get any relief.  The pain eventually  subside subsided on its own.  He does not think it was related to the  food he was eating and he did not feel like the food had gotten stuck.  He continues to work out, in fact worked out this past Monday and did  not have any episodes of chest pain.  He denies any syncope or  presyncope.  He denies any orthopnea or PND, nausea, vomiting,  diaphoresis.  There is no radiation of pain.   CURRENT MEDICATIONS:  1. Albuterol 0.83 mg/mL three to four times a day.  2. Symbicort 160 mg/4.5 mg twice a day.  3. Actos 15 mg a day.  4. Singulair 10 mg a day.  5. Nexium 40 mg once a day.  6. Benicar 40 mg once a day.  7. Lipitor 20 mg a day.  8. Aggrenox 25 mg/200 mg twice a day.  9. Loratadine 10 mg a day.  10.Cardizem LA 180 mg a day.  11.Furosemide 20 mg a day.  12.Ferrex 150 twice a day.  13.Vitamin C twice a day.  14.Theophylline 200 mg a day.  15.Prednisone 2.5 mg a day.  16.Spiriva 18 mcg at night.  17.Metoclopram 5 mg at night.   HE IS ALLERGIC TO SULFA.   PAST MEDICAL HISTORY:  1. Status post PTCA and stenting of his right coronary artery.  He      stented it using a 3 x 28-mm Taxus stent.  The area was post      dilated using a 3.25 x 18-mm Quantum Maverick inflated up to 16      atmospheres.  2. Hyperlipidemia.  3. Diabetes mellitus.  4. History of a question of TIA.  5. Anemia.  6. COPD.  7. Allergies.   SOCIAL HISTORY:  The patient used to smoke, but quit over 15 years ago.  He does not drink alcohol.   FAMILY HISTORY:  Unremarkable.   REVIEW OF SYSTEMS:  Reviewed and  is essentially negative, except as  noted in the HPI.   PHYSICAL EXAMINATION:  He is an elderly gentleman in no acute distress.  He is alert and oriented times three and his mood and affect are normal.  His temperature is 98.3.  His blood pressure is 152/70, heart rate 75.  HEENT: Exam reveals 2+ carotids, no bruits, no JVD, no thyromegaly.  LUNGS: Clear to auscultation.  HEART:  Regular rate, S1-S2.  ABDOMINAL EXAM:  Reveals good bowel sounds, nontender.  EXTREMITIES:  He has no clubbing, cyanosis or edema.  NEURO EXAM:  Nonfocal.   His EKG reveals normal sinus rhythm.  Has no ST or T-wave changes.  Laboratory data is pending.   IMPRESSION:  Chest pain.  The patient has a history of coronary artery  disease.  He had numerous minor episodes of chest pain and now presents  with episodes of chest tightness.   PLAN:  1. I am inclined to schedule him for heart catheterization.  2. We will collect cardiac enzymes.  3. We will not start heparin or Integrilin at this point, but we will      wait until his enzymes back.  4. All his other medical problems remain fairly stable.           ______________________________  Thayer Headings, M.D.     PJN/MEDQ  D:  04/05/2007  T:  04/05/2007  Job:  [id number]   cc:   Valetta Fuller, Dr.

## 2010-09-14 NOTE — Assessment & Plan Note (Signed)
[facility]                             PULMONARY OFFICE NOTE   NAME:[name], [name]                        MRN:          [id number]  DATE:09/18/2006                            DOB:          [birth year]    I saw [name] today in followup for his moderately obstructive sleep  apnea, central apnea with sleep onset, chronic rhinitis, and severe COPD  with emphysema.  He had resumed theophylline on the request of his  primary physician.  He says that his peak flows in the morning have been  around 180.  He has been noticing increasing shortness of breath for the  last shortness of breath for the last several days with wheezing.  He  says that the Symbicort and Spiriva seem to be helping some and he does  feel that since being restarted on the theophylline this has helped to  some degree as well.  He is currently using a nasal mask with his CPAP  machine and does not have any snoring.  He says that he has had  decreased nasal congestion and decreased coughing.  He is using his  nebulizer about 3 times a day.   CURRENT MEDICATIONS:  1. Albuterol nebulizer and Atrovent nebulizer 3 times per day.  2. Symbicort 160/4.5 two puffs b.i.d.  3. Spiriva 1 puff daily.  4. Actos 15 mg daily.  5. Singulair 10 mg nightly.  6. Nexium 40 mg daily.  7. Benicar 40 mg daily.  8. Lipitor 20 mg daily.  9. Plavix 75 mg daily.  10.Loratadine 10 mg daily.  11.Cardizem LA 180 mg daily.  12.Furosemide 20 mg daily.  13.Ferrex 150 mg b.i.d.  14.Vitamin C 500 mg b.i.d.  15.Theophylline 200 mg daily.  16.Metformin 500 mg daily.  17.VESIcare 5 mg daily.  18.Aspirin 81 mg daily.   PHYSICAL EXAMINATION:  He is 157 pounds, temperature is 97.7, blood  pressure 132/68, heart rate is 90, oxygen saturation is 95% on room air.  HEENT:  There was no sinus tenderness, there was a clear nasal  discharge, there was no oral lesions.  No lymphadenopathy.  HEART:  With S1-S2.  CHEST:   Decreased breath sounds, prolonged expiratory phase but no  wheezing.  ABDOMEN:  Thin, soft, nontender.  EXTREMITIES:  No edema.   IMPRESSION:  1. 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  I      will continue him on his current regimen of Symbicort and Spiriva,      theophylline, Singulair, and albuterol as needed.  I will also have      him undergo pulmonary rehab.  He does appear to have a slight      exacerbation and I will give him a tapering course of prednisone      over the next 6 days.  He may also have some degree of vocal cord      dysfunction and this will be further assessed at his next followup.  2. Chronic rhinitis.  He is to continue on his nasal regimen.  3. Moderate obstructive sleep apnea.  He  seems to be doing reasonably      well on his current setting of continuous positive airway pressure      at 7 cm of water.  4. I will follow up with him in approximately 6-8 weeks.     [name], MD  Electronically Signed    VS/MedQ  DD: 11/06/2006  DT: 11/06/2006  Job #: [id number]

## 2010-09-14 NOTE — Procedures (Signed)
DUPLEX DEEP VENOUS EXAM - LOWER EXTREMITY   INDICATION:  Left lower extremity edema.   HISTORY:  Edema:  Left.  Trauma/Surgery:  No.  Pain:  No.  PE:  No.  Previous DVT:  No.  Anticoagulants:  No.  Other:   DUPLEX EXAM:                CFV   SFV   PopV  PTV    GSV                R  L  R  L  R  L  R   L  R  L  Thrombosis    o  o     o     o      o     o  Spontaneous   +  +     +     +      +     +  Phasic        +  +     +     +      +     +  Augmentation  +  +     +     +      +     +  Compressible  +  +     +     +      +     +  Competent     +  +     +     +      +     +   Legend:  + - yes  o - no  p - partial  D - decreased   IMPRESSION:  1. Left lower extremity appears to be negative for deep venous      thrombosis.  2. Preliminary report called to Elby Showers at Dr. Shon Baton office.    _____________________________  Clayton Bibles. Leia Alf, MD   NT/MEDQ  D:  12/04/2009  T:  12/04/2009  Job:  [id number]

## 2010-09-14 NOTE — Discharge Summary (Signed)
[name], [name]                 ACCOUNT NO.:  [id number]   MEDICAL RECORD NO.:  [id number]          PATIENT TYPE:  INP   LOCATION:  [id number]                         FACILITY:  [facility]   PHYSICIAN:  Thayer Headings, M.D. DATE OF BIRTH:  [birth year]   DATE OF ADMISSION:  04/05/2007  DATE OF DISCHARGE:  04/06/2007                               DISCHARGE SUMMARY   DISCHARGE DIAGNOSES:  1. Chest pain - most likely noncardiac.  2. History of coronary artery disease - status post PTCA and stenting      of his right coronary artery.  3. Hypertension.  4. History of stroke.  5. History of COPD.  6. Diabetes mellitus.  7. Hyperlipidemia.   DISCHARGE MEDICATIONS:  1. Benicar 40 mg a day.  2. Aspirin 81 mg a day.  3. Aggrenox 25 mg/200 mg twice a day.  4. Prednisone 2.5 mg a day and as further directed by Dr. Philip Aspen.  5. Metformin 500 mg a day or as otherwise directed by Dr. Philip Aspen.  6. Actos 15 mg a day.  7. Nexium 40 mg a day.  8. Vesicare 5 mg once a day.  9. Meloxicam 15 mg once a day.  10.Ferrex 150 mg twice a day.  11.Vitamin C twice a day.  12.Spiriva 18 mcg once a day.  13.Nitrolingual spray as needed for chest pain.  14.Symbicort twice a day.  15.Albuterol nebulizer four times a day.  16.Singulair 10 mg a day.  17.Lipitor 20 mg a day.  18.Loratadine 10 mg a day.  19.Cardizem LA 180 mg a day.  20.Furosemide 20 mg a day.  21.Theophylline 200 mg once a day.  22.Metoclopramide 5 mg a day.   DISPOSITION:  The patient will see Dr. Acie Fredrickson on Monday for a stress  Cardiolite study.  He is to call if he has any further episodes of chest  pain.  We have written him a new prescription for Nitrolingual spray.   HISTORY OF PRESENT ILLNESS:  Mr. [name] is a 77 year old gentleman with a  history of coronary artery disease.  He is admitted through the  emergency room on December 4 with episodes of chest pain.   HOSPITAL COURSE:  CHEST PAIN.  The patient ruled out for myocardial  infarction with serial CPK and MBs.  His EKG was normal on admission and  was normal with a following.  He did not have any further episodes.  To  have some mild indigestion like sensation this in the middle of the  night and felt more like indigestion and only lasted for 10 seconds or  so.  We will ambulate the patient and will discharge him if he is  stable.  Will see him again as noted on Monday.  All his other medical  problems remained stable.           ______________________________  Thayer Headings, M.D.     PJN/MEDQ  D:  04/06/2007  T:  04/06/2007  Job:  [id number]   cc:   Ermalene Searing. Philip Aspen, M.D.

## 2010-09-14 NOTE — Assessment & Plan Note (Signed)
[facility]ALTHC[facility]                             PULMONARY OFFICE NOTE   NAME:[name], [name]                        MRN:          [id number]  DATE:04/02/2007                            DOB:          1929-01-21    PULMONARY FOLLOWUP VISIT   I saw [name] today in followup for his moderate obstructive sleep  apnea and severe COPD.  He says that his symptoms have been relatively  stable since his last visit with me.  He continues to use his nebulizer  at least 3 times a day, and says this does help.  He was to have further  laboratory tests to look into the possibility of an asthmatic component,  but I am not sure if he ever actually had this done.  He continues to  use prednisone at 5 mg every other day.  He says that he has not had any  problems with the use of his CPAP, although he says what happens is that  he sometimes will wake up in the middle of the night and find that it  has come off.  Then, when he does this, he will put it back on.   CURRENT MEDICATIONS:  1. Albuterol nebulizer t.i.d.  2. Symbicort 160/4.5 two puffs b.i.d.  3. Spiriva 1 puff daily.  4. Actos 50 mg daily.  5. Singulair 10 mg daily.  6. Benicar 40 mg daily.  7. Lipitor 20 mg daily.  8. Aggrenox 25/200 nightly.  9. Loratadine 10 mg daily.  10.Cardizem LA 180 mg daily.  11.Lasix 20 mg daily.  12.Ferrex 300 mg daily.  13.Vitamin C 500 mg daily.  14.Theophylline 200 mg daily.  15.Centrum Silver once daily.  16.Metformin 500 mg daily.  17.Vesicare 40 mg daily.  18.Aspirin 81 mg daily.  19.Prednisone 5 mg every other day.   PHYSICAL EXAM:  He is 155 pounds, temperature 98.1, blood pressure  116/58, heart rate is 80.  Oxygen saturation is 96% on room air.  HEENT:  There is no sinus tenderness.  No oral lesions.  No  lymphadenopathy.  HEART:  S1, S2.  CHEST:  There was prolonged expiratory phase, but no wheezing.  ABDOMEN:  Soft and nontender.  EXTREMITIES:  No edema.   IMPRESSION:  1. Severe chronic obstructive pulmonary disease.  I will continue him      on his current inhaler regimen of Spiriva, Symbicort, and      albuterol.  I have also asked him to look into having further      assessment with a RAST test, a serum IgE level, and a CBC with      differential to determine if in fact he may have an allergic      component and an asthmatic component to his disease, and if that is      the case, then possible consideration will be to start him on      Xolair.  In addition, I have also advised him to try and decrease      the dose of his prednisone to 2.5 mg every other  day with the hope      of eventually being able to taper him off of this completely.  2. Moderate obstructive sleep apnea.  I have encouraged him to      maintain his compliance with CPAP.  In addition, I have also      encouraged him to maintain a diet, exercise, and weight reduction      program.   I will follow up with him in about 4 to 6 months, but will call him when  I am able to review the results of his laboratory tests.     [name], MD  Electronically Signed    VS/MedQ  DD: 04/03/2007  DT: 04/03/2007  Job #: [id number]   cc:   Ermalene Searing. Philip Aspen, M.D.

## 2010-09-14 NOTE — Assessment & Plan Note (Signed)
[facility]                             PULMONARY OFFICE NOTE   NAME:[name], Robert DELOERA                        MRN:          [id number]  DATE:11/09/2006                            DOB:          06-Oct-1928    I saw [name] today in followup for his sleep apnea and COPD.  He is  doing reasonably well on his current regimen with regards to his COPD.  He says he has had some good days and some bad days.  He did reasonably  okay with his pulmonary rehab and [facility]  He says that he does get dyspneic if he tries to  increase the intensity level of his exercise, which he is doing mostly  on a treadmill, but that if he maintains a low intensity exercise level  he is able to tolerate this reasonably well.  He says that his peak  flows have been running in the 200s range whereas previously they were  300, but has been consistently 200.  He does have intermittent episodes  of coughing with clear sputum production.  He denies any significant  wheezing or chest pain.  He also has not had any problems with fevers or  sweats.  With regards to his sleep apnea, he says he has noticed a  difference as far as how he is sleeping at night as well as how he feels  during the day.  He will have episodes where he will inadvertently  remove his mask at night, but then when he wakes up he remembers to put  it back on.  Otherwise he is not having any difficulty with the dryness  in his nose or his mouth or nasal congestion.  His medication list was  reviewed.   PHYSICAL EXAMINATION:  He is 154 pounds, temperature is 98.2, blood  pressure is 112/54, heart rate is 98.  Oxygen saturation is 95% on room  air.  HEENT:  There is no sinus tenderness, no oral lesions, no  lymphadenopathy.  HEART:  S1, S2.  CHEST:  No wheezing or rales.  ABDOMEN:  Thin, soft, nontender.  EXTREMITIES:  No edema.   IMPRESSION:  1. Moderate obstructive  sleep apnea.  He seems to be doing quite well      on his current setting of CPAP of  7 cm of water and I would have      him continue on this.  2. Severe chronic obstructive pulmonary disease.  I would encourage      him to continue his exercise program.  I will continue him on his      current regimen of albuterol and Atrovent nebulizer as well as      Symbicort 160/4.5, Spiriva, Singulair and theophylline.  Again, I      will defer measurement of his theophylline levels to Dr. Philip Aspen.      I have advised him to monitor his peak flows and if he notices a      significant decline that he should seek further medical  attention.  3. I will follow up with him in approximately 4 to 6 months.     [name], MD  Electronically Signed   VS/MedQ  DD: 11/09/2006  DT: 11/10/2006  Job #: [id number]   cc:   Ermalene Searing. Philip Aspen, M.D.

## 2010-09-14 NOTE — H&P (Signed)
[name], [name]                 ACCOUNT NO.:  [id number]   MEDICAL RECORD NO.:  [id number]          PATIENT TYPE:  INP   LOCATION:  4710                         FACILITY:  [facility]   PHYSICIAN:  Ermalene Searing. Philip Aspen, M.D.DATE OF BIRTH:  1928-06-02   DATE OF ADMISSION:  12/06/2006  DATE OF DISCHARGE:                              HISTORY & PHYSICAL   CHIEF COMPLAINT:  Dizziness with vomiting.   HISTORY OF PRESENT ILLNESS:  The patient is a 77 year old man who is  well-known to me.  He felt fine earlier this morning as he had breakfast  with his son who is a physician with our practice.  After breakfast and  taking his usual medications, he developed marked lightheadedness  followed by nausea and vomiting.  He denied recent fever, chills, vision  change, headaches, chest pain, palpitations, constipation, or diarrhea.  He had been seen in our office on November 27, 2006 with vague abdominal  pain and a normal abdominal exam with a CBC showing white blood cell  count 15.6 with a left shift, so he was empirically started on Augmentin  for 10 days.  He also at that time had minimal rectal bleeding, so his  aspirin was on hold; however, he continued his Plavix treatment.   He presented to our office and had several episodes of bilious vomiting,  so he is admitted to the hospital for further evaluation.   PAST MEDICAL HISTORY:  1. Otherwise significant for asthma which is moderate and persistent.  2. Seasonal allergic rhinitis.  3. Gastroesophageal reflux disease with hiatal hernia.  4. In 2004 esophageal stricture dilatation.  5. Coronary artery disease status post 2005 PTCA and stent of the      right coronary artery.  6. Hyperlipidemia.  7. Diabetes mellitus, type 2 which has been under good control with      most recent hemoglobin A1c level 7.2% in May 2008.  8. Anemia with 2006 workup for GI blood loss that included EGD,      colonoscopy, and small-bowel follow-through unremarkable.  9.  Bilateral sensory neural hearing loss.  10.Osteoarthritis of the hips, knees, and lumbar spine.  11.Benign prostatic hypertrophy with overactive bladder.   MEDICATIONS PRIOR TO ADMISSION:  1. Spiriva 18 mcg inhalation daily.  2. Singulair 10 mg p.o. daily.  3. Albuterol nebulizer treatments as needed.  4. Symbicort 160/4.5 1 to 2 inhalations twice daily.  5. Theo-Dur 200 mg daily.  6. Actos 15 mg p.o. daily.  7. Glucophage XR 500 mg daily.  8. Lipitor 20 mg daily.  9. Nu-Iron 150 twice daily.  10.Nexium 40 mg daily.  11.Vesicare 5 mg daily.  12.Enteric-coated aspirin 325 mg daily on hold.  13.Plavix 75 mg p.o. daily.  14.Mobic 15 mg p.o. daily p.r.n. pain.  15.Claritin 10 mg p.o. daily.  16.Benicar 40 mg p.o. daily.  17.Lasix 20 mg p.o. daily.  18.Cardizem CD 180 mg p.o. daily.  19.Reglan 5-10 mg p.o. q.h.s. p.r.n. bloating.   ALLERGIES:  SULFA DRUGS have been associated with a rash.   DRUG INTOLERANCES:  Guaifenesin, labetalol has been associated  with low  blood pressures, and Flomax.   PAST SURGICAL HISTORY:  1. He had a remote right ear operation with probable metallic implant.  2. In 2002 he had functional sinus surgery.  3. In 2003 he had right knee arthroscopy.  4. In August 2048 colonoscopy.  5. In June 2004 he had L4-5 level laminectomy.  6. In March 2005 he had PTCA and stent procedure of the right coronary      artery.   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  He has three sons  and one daughter.   FAMILY HISTORY:  Notable for close relatives with diabetes mellitus and  asthma.   REVIEW OF SYSTEMS:  See history of present illness.   PHYSICAL EXAMINATION:  VITAL SIGNS:  Supine blood pressure was 142/72  while standing blood pressure was 122/62, pulse 90, respirations 20,  temperature 97.8.  GENERAL:  He is a well-nourished, well-developed man who was initially in  no apparent distress, but had several episodes of vomiting in our  office.  HEENT:  Pupils were equal, round and reactive to light.  Extraocular  movements were intact.  NECK:  Supple without jugular venous distention or carotid bruit.  CHEST:  Bilateral scattered wheezing which is his baseline exam.  CARDIOVASCULAR:  Heart had a regular rate and rhythm without significant  murmur or gallop.  ABDOMEN:  Normal bowel sounds and no hepatosplenomegaly or tenderness.  EXTREMITIES: Without cyanosis, clubbing, or edema.  NEUROLOGICAL EXAM:  He was alert and oriented x3, cranial nerves II-XII  were significant for decreased hearing bilaterally.  He had intact  finger-to-nose testing bilaterally.  He had a wide-based gait that  required standby assistance.  He was unable to do a tandem gait.  A  modified Nylen/Barany test was normal.   LABORATORY STUDIES:  White blood cell count 13.2 with 71% granulocytes,  hemoglobin 12, hematocrit 37, platelets 289.  Serum sodium 136,  potassium 3.8, chloride 100, carbon dioxide 24, BUN 18, creatinine 1.0,  glucose 166.  A CT scan of the head without contrast and EKG were  pending at the time of dictation.   IMPRESSION AND PLAN:  1. Dizziness:  This is of uncertain etiology.  His abrupt onset could      be due to an acute stroke.  His lack of vertigo argues against      benign positional vertigo.  A transient arrhythmia is possible.  He      is borderline orthostatic by blood pressure change with position      changes and his symptoms could be aggravated by his blood pressure      medications.  I plan to admit him to a telemetry bed and obtain a      CT scan of the head initially as noncontrast to rule out CNS bleed.      We will also check his 12-lead EKG and give him Zofran to help with      the nausea.  He will be continued on proton pump inhibitor      treatment.  A neurology consultant  will be asked to evaluate his      case.  2. Vomiting:  This may be secondary to his significant dizziness or      other CNS process.  Alternatively, this could  be due to a primary      GI process.  A urinary tract infection is a remote possibility.  We      will check a urinalysis and urine culture as well as abdomen x-rays      and a chest x-ray.  3. Diabetes mellitus, type 2:  This has been under fairly good      control.  While he has an acute illness with unclear intravascular      volume status, we will hold his Glucophage treatment.  We will add      sliding scale insulin as needed to try to maintain euglycemia.  4. Coronary artery disease:  This appears to be fairly stable with      lack of chest discomfort or worsening dyspnea on exertion.  Again,      we will check an EKG and also a troponin I test and CPK level.           ______________________________  Ermalene Searing. Philip Aspen, M.D.     DGP/MEDQ  D:  12/06/2006  T:  12/07/2006  Job:  [id number]   cc:   Thayer Headings, M.D.  Jeryl Columbia, M.D.  Nelida Gores, M.D.

## 2010-09-14 NOTE — Consult Note (Signed)
NAMEJONMARC, KOLKMAN                 ACCOUNT NO.:  [id number]   MEDICAL RECORD NO.:  [id number]          PATIENT TYPE:  INP   LOCATION:  4710                         FACILITY:  [facility]   PHYSICIAN:  Pramod P. Leonie Man, MD    DATE OF BIRTH:  06-25-1928   DATE OF CONSULTATION:  12/06/2006  DATE OF DISCHARGE:                                 CONSULTATION   REFERRING PHYSICIAN:  Leanna Battles, MD   REASON FOR REFERRAL:  Stroke.   HISTORY OF PRESENT ILLNESS:  Mr. Massaquoi is a 77 year old [place] male  who developed sudden onset of nausea with some dizziness and gait ataxia  starting at around 9:30 a.m. today.  The patient called his primary  physician, Dr. Leanna Battles, who evaluate the patient in his office  and did not find any focal deficits, but the patient was clearly off  balance and not able to walk well; hence, the patient was referred to  [facility] for further evaluation as a direct admit.  The  patient has not yet had a CT scan of the head.  He is feeling slightly  better with improved balance; the mild nausea persists.  He denies any  headache, slurred speech, blurred vision, diplopia or focal extremity  weakness and numbness.  There is no prior history of stroke, TIA,  seizures or significant neurological problems.   PAST MEDICAL HISTORY:  1. COPD.  2. Hypertension.  3. Diabetes.  4. Hyperlipidemia.  5. Sleep apnea.  6. Coronary artery disease.  7. Gastroesophageal reflux disease.   PAST SURGICAL HISTORY:  1. Lumbar laminectomy.  2. Knee surgery.  3. Right ear surgery 30 years ago.   SOCIAL HISTORY:  The patient is a retired Network engineer.  He used to smoke  for 40 pack-years, but quit 15 years ago.  He is independent in  activities of daily living.   FAMILY HISTORY:  Not significant for anybody with strokes or  neurological problems.   HOME MEDICATIONS:  Singulair, Lipitor, Nu-Iron, Vesicare, albuterol,  Claritin, Protonix, Benicar, Symbicort, Cardizem,  Reglan, Plavix,  aspirin, presently on hold, Theo-Dur, Ambien.   REVIEW OF SYSTEMS:  Positive only for some nausea and dizziness, gait  difficulties.  Negative chest pain, fever, cough, shortness of breath or  diarrhea.   PHYSICAL EXAM:  GENERAL:  Exam reveals an elderly Asian [place] male who  is comfortable, not in distress.  VITAL SIGNS:  He is afebrile at present with pulse rate of 91 per  minute, regular sinus, respiratory 22 per minute, blood pressure 160/90,  SATs of 96% on room air.  HEAD:  Nontraumatic.  NECK:  Supple without bruit.  EENT:  Exam is deferred, but he does have decreased hearing as per his  son.  CARDIAC:  Regular heart sounds.  LUNGS:  Clear to auscultation.  NEUROLOGICAL:  The patient is pleasant, awake, alert, cooperative.  There is no aphasia, apraxia or dysarthria.  He follows commands well.  Eye movements are full range without nystagmus.  There is mild left  nasolabial fold asymmetry.  Tongue is midline.  Palatal movements  are  normal.  Motor system exam reveals no upper extremity drift.  He has  slightly diminished fine finger movements on the left.  He does not  orbit the upper extremities; however, he has weakness of the left grip.  There is mild weakness of left hip flexors and ankle dorsiflexors, 4+/5.  Deep tendon reflexes are all brisk and easily elicited.  Plantars are  downgoing.  There is no objective sensory loss.  Finger-to-nose  coordination is accurate.  The patient is able to get out of a bed  without assistance.  He does not have significant truncal ataxia and not  lean in either direction.  He is slightly unsteady with standing on left  foot unsupported and has mild weakness of left ankle dorsiflexors when  he stands on his heels.   DATA REVIEW:  No CT scan or labs are available at the present time.   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   PLAN:  I would recommend a CT scan of the head and subsequently MRI scan  of the brain if it is feasible.  The patient does have some concern  about metal in his ear, so this need to be addressed before MRI is done.  Check carotid ultrasound and transcranial Doppler studies, 2-D  echocardiogram, fasting lipid profile, hemoglobin A1c and homocysteine.  Continue Plavix and replace the aspirin with Aggrenox because of  superiority in stroke prevention.  Physical and Occupational Therapy  consults.  I had a long discussion with the patient as well as his son,  Dr. Berneta Sages, about his condition, plan for evaluation and treatment  and answered questions.   Thank you for the referral.           ______________________________  Kathie Rhodes. Leonie Man, MD     PPS/MEDQ  D:  12/06/2006  T:  12/07/2006  Job:  [id number]

## 2010-09-17 NOTE — Op Note (Signed)
NAME:  [name], [name]                           ACCOUNT NO.:  [id number]   MEDICAL RECORD NO.:  [id number]                   PATIENT TYPE:  AMB   LOCATION:  DFTL                                 FACILITY:  [facility]   PHYSICIAN:  Jeryl Columbia, M.D.                 DATE OF BIRTH:  1928-11-23   DATE OF PROCEDURE:  08/19/2002  DATE OF DISCHARGE:                                 OPERATIVE REPORT   PROCEDURE:  Esophagogastroduodenoscopy with biopsy and Savary dilatation.   ENDOSCOPIST:  Jeryl Columbia, M.D.   INDICATIONS FOR PROCEDURE:  Upper tract symptoms, dysphagia, iron  deficiency.  A consent was signed after the risks, benefits, methods and  options were thoroughly discussed in the office.   MEDICINES USED:  Demerol 50 mg, Versed 2.5 mg.   DESCRIPTION OF PROCEDURE:  The video endoscope was inserted by direct  vision.  The esophagus was normal.  In the distal esophagus was a moderate-  sized hiatal hernia with a widely patent thin widely patent ring.  No signs  of Barrett's or esophagitis were seen.  The scope was passed into the  stomach.  _________ normal, antrum normal.  The pylorus into the duodenal  bulb pertinent for some bulb erosions.  Around the C-loop appeared normal,  the second portion of the duodenum.  The scope was withdrawn back to the  bulb.  A good look there confirmed the erosions.  Ruled out any significant  ulcer.  The scope was withdrawn back to the stomach and retroflexed.  The  angularis, cardia, and fundus were pertinent for the hiatal hernia being  confirmed in the cardia.  The lesser and greater curves were normal in  retroflexed visualization.  Straight visualization of the stomach was  normal.  The scope was then slowly withdrawn back to about 18 cm.  No  additional esophageal findings were seen.  The scope was then advanced to  the antrum and we looked in the bulb one more time, which confirmed the  above findings.  I took one biopsy of the antrum for the  CLO test to rule  out Helicobacter, and then the Savary wire was advanced into the antrum and  under fluoroscopy guidance the customary J-loop of the wire was confirmed.  While the scope was withdrawn, we made sure to keep the wire in the proper  location, and went ahead and proceeded with a 16 mm Savary dilatation over  the wire under fluoroscopic guidance in the customary fashion.  There was no  resistance or heme on the dilator.  Once the dilator was confirmed in the  stomach, the wire and the dilator were removed in tandem.  The patient tolerated the procedure well.  There was no obvious immediate  complication.   ENDOSCOPIC ASSESSMENT:  1. Moderate hiatal hernia with a thin widely patent ring.  2. Bulb erosions.  3. Otherwise normal  esophagogastroscopy, status post CLO biopsy.  Rule out     Helicobacter.  4. Savary dilatation under fluoroscopy to 16 mm only.  One dilator done     without resistance or heme.   PLAN:  Continue pump inhibitors, care with aspirin.  See how the dilation  works.  Possibly increase dilation to b.i.d.  Possibly will need more  aggressive dilatation in the future.  Possibly will need more aggressive  workup for  his iron deficiency.  Supposedly he has done guaiacs and has had a CBC, and  will need to get those results from his wife, and get Dr. Ermalene Searing.  Paterson's opinion, to see whether we need to proceed with a colonoscopy or  small bowel follow-through, just to be sure.                                                Jeryl Columbia, M.D.    MEM/MEDQ  D:  08/19/2002  T:  08/19/2002  Job:  [id number]   cc:   Ermalene Searing. Philip Aspen, M.D.  [address]  [place]  [place] [address]  Fax: [phone]

## 2010-09-17 NOTE — Op Note (Signed)
[name], [name]                 ACCOUNT NO.:  [id number]   MEDICAL RECORD NO.:  [id number]          PATIENT TYPE:  INP   LOCATION:  0006                         [facility]ACILITY:  [facility]   PHYSICIAN:  Gaynelle Arabian, M.D.    DATE OF BIRTH:  [birth year]   DATE OF PROCEDURE:  08/23/2004  DATE OF DISCHARGE:                                 OPERATIVE REPORT   PREOPERATIVE DIAGNOSIS:  Osteoarthritis, right knee.   POSTOPERATIVE DIAGNOSIS:  Osteoarthritis, right knee.   PROCEDURE:  Right total knee arthroplasty.   SURGEON:  Dr. Wynelle Link.   ASSISTANT:  Alexzandrew L. Dara Lords, P.A.   ANESTHESIA:  Spinal.   ESTIMATED BLOOD LOSS:  Minimal.   DRAINS:  Hemovac x1.   TOURNIQUET TIME:  41 minutes at 300 mmHg.   COMPLICATIONS:  None.   CONDITION:  Stable to recovery.   BRIEF CLINICAL NOTE:  [name] is a 77 year old male with severe end-stage  osteoarthritis of the right knee with pain intractable to nonoperative  management including injections.  He presents now for right total knee  arthroplasty.   PROCEDURE IN DETAIL:  After successful administration of spinal anesthetic,  a tourniquet was placed high on the right thigh and the right lower  extremity prepped and draped in the usual sterile fashion.  The extremity  was wrapped in Esmarch, the knee flexed and the tourniquet inflated to 300  mmHg.  A standard midline incision was made with a 10 blade through the  subcutaneous tissue to the level of the extensor mechanism.  A fresh blade  was used to make a medial parapatellar arthrotomy in the soft tissue over  the proximal medial tibia and subperiosteally elevated the joint line with a  knife and the semimembranosus bursa with a Cobb elevator, soft tissue over  the proximal lateral tibia was also elevated with attention being paid to  avoid the patellar tendon and tibial tubercle.  The patella was everted and  the knee flexed to 90 degrees.  The anterior cruciate ligament and posterior  cruciate ligament were removed.  A drill was used to create a starting hole  in the distal femur.  The canal was irrigated.  The 5 degree right valgus  alignment guide was placed and referencing off his posterior condyles,  rotation was marked in the block pin to remove 10 mm off the distal femur.  The distal femoral resection was made with an oscillating saw.  The sizing  block is placed and a size 3 is the most appropriate.  Rotation is marked  off the epicondylar axis.  Size 3 cutting block is placed and the anterior,  posterior and chamber cuts are made.   The tibia is subluxed forward and the menisci are removed.  Extramedullary  tibial alignment guides are placed referencing proximally at the medial  aspect of the tibial tubercle and distally along the second metatarsal axis  and tibial crest.  The block is pinned to remove 10 mm off the non deficient  lateral side.  The tibial resection is made with an oscillating saw.  This  barely got  to the base of the medial tibial defect, thus we took an  additional 2 mm to get down just below the medial defect.  A size 3 is the  most appropriate tibial component and the proximal tibia is repaired with a  modular drill and key hole punch for a size 3.  The femoral preparation is  completed with the intercondylar cut.   The size 3 mobile-bearing tibial trial with a size 3 posterior stabilized  femoral trial and a 12.5 mm posterior stabilized rotating platform trial are  placed.  Full extension is achieved with excellent varus and valgus balance  throughout and full range of motion.  The patella is everted and thickness  measured to be 25 mm.  Free hand resection is taken down to 14 mm, 38  template is placed, low holes were drilled, trial patella is placed and it  tracks normally.  The osteophytes were then removed off the posterior femur  with the trial in place.  All trials were removed and the cut bony surfaces  are prepared with pulsatile  lavage.  Cement is mixed and once ready for  implantation, the size 3 mobile bearing tibial tray, size 3 posterior  stabilized femur and 38 patella are cemented into place and the patella is  held with the clamp.  Trial 12.5 inserts are placed with the knee held in  full extension.  All extruded cement removed.  Once the cement is fully  hardened and a permanent 12.5 mm posterior stabilized rotating platform  insert is placed into the tibial tray.  The wound is copiously irrigated  with saline solution and the extensor mechanism closed over a Hemovac drain  with interrupted #1 PDS.  Flexion against gravity is 135 degrees.  The  tourniquet was released with a total time of 41 minutes and minor bleeding  stopped with cautery.  The subcutaneous was then closed with interrupted 2-0  Vicryl, the subcuticular with a running 4-0 Monocryl.  The incisions were  cleaned and dried and Steri-Strips and a bulky sterile dressing applied.  He  was then awakened, placed into a knee immobilizer and transported to  recovery in stable condition.      FA/MEDQ  D:  08/23/2004  T:  08/23/2004  Job:  [id number]

## 2010-09-17 NOTE — Op Note (Signed)
[name], [name]                           ACCOUNT NO.:  [id number]   MEDICAL RECORD NO.:  [id number]                   PATIENT TYPE:  AMB   LOCATION:  ENDO                                 FACILITY:  [facility]   PHYSICIAN:  Jeryl Columbia, M.D.                 DATE OF BIRTH:  1928-10-20   DATE OF PROCEDURE:  12/16/2002  DATE OF DISCHARGE:                                 OPERATIVE REPORT   PROCEDURE:  Colonoscopy.   INDICATIONS:  Patient with iron deficiency.  Consent was signed after risks,  benefits, methods, and options thoroughly discussed in the office.   MEDICINES USED:  Demerol 50 mg, Versed 5 mg.   DESCRIPTION OF PROCEDURE:  Rectal inspection was pertinent for external  hemorrhoids, small.  Digital exam was negative.  The video pediatric  adjustable colonoscope was inserted, fairly easily advanced around the colon  to the cecum.  This did require rolling him on his back and some abdominal  pressures.  At the approximate level of the hepatic flexure a tiny polyp was  seen on insertion and was hot biopsied on insertion.  Other than an occasion  left and right diverticulum, no other abnormalities were seen.  The scope  was inserted a short way into the terminal ileum, which was pertinent for a  rare erosion, probable aspirin-induced, and a few cold biopsies were  obtained.  The cecum was identified by the appendiceal orifice and the  ileocecal valve.  The scope was slowly withdrawn.  The prep was adequate.  There was some liquid stool that required washing and suctioning.  On slow  withdrawal through the colon we withdrew back to the polyp seen on  insertion, and possibly there was some residual polypoid tissue, which was  cold biopsied x2.  There was another tiny questionable transverse polyp,  which was cold biopsied x2 as well.  They were all put in the same  container.  The scope was slowly withdrawn.  In the mid-descending, two  other questionable polyps were seen and  each were hot biopsied x1 and put in  a third container.  The scope was further withdrawn.  Other than the  occasional left-sided diverticula, no other abnormalities were seen.  Anorectal pull-through and retroflexion confirmed some hemorrhoids.  The  scope was reinserted a short way up the left side of the colon, air was  suctioned, the scope removed.  The patient tolerated the procedure well.  There was no obvious immediate complication.   ENDOSCOPIC DIAGNOSES:  1. Internal-external hemorrhoids.  2. Left and right occasional diverticula.  3. Four questionable polyps, two in the descending hot biopsied, two in the     transverse, one hot and cold biopsied, the other cold biopsied.  4. Terminal ileum minimal erosions, rare, cold biopsied, probably aspirin-     induced.  5. Otherwise within normal limits to the cecum and terminal  ileum.    PLAN:  Await pathology to determine future colonic screening.  Happy to see  back p.r.n.  If he remains guaiac-positive or iron-deficient, would proceed  with a small bowel follow-through next.                                               Jeryl Columbia, M.D.    MEM/MEDQ  D:  12/16/2002  T:  12/16/2002  Job:  [id number]   cc:   Ermalene Searing. Philip Aspen, M.D.  [address]  [place]  [place] [address]  Fax: [phone]

## 2010-09-17 NOTE — Assessment & Plan Note (Signed)
[facility]                             PULMONARY OFFICE NOTE   NAME:AVVATimmy, [name]                        MRN:          [id number]  DATE:07/12/2006                            DOB:          1928/06/23    REASON FOR CONSULTATION:  Dyspnea.   HISTORY OF PRESENT ILLNESS:  Mr. Joung is a delightful 77 year old  Asian/Indian male who presents for evaluation of dyspnea of a few years'  duration.  The patient could not quantitate exactly.  Noted to have the  diagnosis of asthma since the onset of dyspnea.  He states that he has  had difficulties on and off over the years with triggers with asthma.  He did not initially note a particular situation that would trigger  this.  He thinks there may have been some seasonal involvement in making  his symptoms worse.  Albuterol makes his symptoms better.  Prior to two  to three months ago, he had not been on any continuous inhalation  therapy, but mostly albuterol p.r.n. and Singulair for nasal symptoms.  Over the last three to four months, however, he has noted increased  difficulties with dyspnea, which seem to be now not triggered by  anything in particular and remain almost constant.  He feels that maybe  Symbicort and Spiriva, which were added approximately at that time, have  made the symptoms somewhat better.  He, however, remains perplexed by  his ongoing dyspnea.  He denies any fevers, chills or sweats but has had  some dry cough on occasion.  He denies any other symptomatology and has  had no chest pain.   PAST MEDICAL HISTORY:  1. Hypertension.  2. History of coronary artery disease with a stent placement  by Dr.      Wonda Cheng Nahser in the past.  3. He has had a diagnosis of asthma.  4. He is also an adult onset diabetic.  5. He has issues with chronic nasal allergies and sinus congestion.   PAST SURGICAL HISTORY:  1. Knee joint replacement in April 2006.  2. A cardiac stent in 2005.  3.  Colonoscopy.  4. Several endoscopies.  5. Spinal surgery for degenerative joint disease in June 2004.  6. Sigmoidoscopy in the past.   ALLERGIES:  SULFA DRUGS.   CURRENT MEDICATIONS:  1. Albuterol nebulization treatment which he is using t.i.d.  2. Symbicort 160/4.5 mg, two inhalations b.i.d.  3. Spiriva, one capsule inhaled daily.  4. Actos 15 mg daily.  5. Singulair 10 mg daily.  6. Nexium, one capsule daily.  7. Benicar 40 mg daily.  8. Lipitor 20 mg daily.  9. Plavix 75 mg daily.  10.Loratadine 10 mg daily p.r.n.  11.Cardizem LA 180 mg daily.  12.Furosemide 20 mg daily.  13.Ferrex 150 mg b.i.d.  14.Vitamin C 500 mg b.i.d.  15.Theophylline 200 mg daily.  16.Metformin 500 mg daily.  17.Vesicare 5 mg daily.  18.Aspirin 81 mg daily.  19.Cetaphil p.r.n.  20.Nitrostat p.r.n.  21.Ciprodex p.r.n.  22.The patient also takes Reglan 5 mg at bedtime.  23.A fiber supplement daily.  24.The patient is also currently completing a prednisone taper.  He      states that prednisone does help his dyspnea.   SOCIAL HISTORY:  He smoked 1/2 pack of cigarettes per day for  approximately 32 years.  He quit approximately 18 years ago.  He drinks  just a small amount of alcohol occasionally.  He is married and lives  with his wife.  He is a retired Glass blower/designer.  He has not had  any recent exotic travel.   FAMILY HISTORY:  Remarkable for asthma in his father and brother.   REVIEW OF SYSTEMS:  Remarkable for dyspnea, as noted above.  Also a  cough, as noted above.  The patient does have also issues with  significant gastroesophageal reflux and allergies.  He also has a  sensory neural hearing loss, which is mild.  The patient does have a  history of daytime somnolence, fatigue and snoring as well.   PHYSICAL EXAMINATION:  VITAL SIGNS:  Blood pressure 132/64, pulse 97,  oxygen saturation 100% on room air, temperature 97.7 degrees.  GENERAL:  This is a very pleasant, moderately-obese  gentleman, who is in  no acute distress.  HEENT:  Unremarkable.  NECK:  Supple, no adenopathy noted, no jugular venous distention, no  thyromegaly.  LUNGS:  Clear.  HEART:  A regular rate and rhythm.  No murmurs, rubs or gallops heard.  ABDOMEN:  Slightly protuberant but otherwise no significant issues.  EXTREMITIES:  The patient has 2+ edema.  NEUROLOGIC:  Grossly nonfocal.   DATA:  We did perform spirometry, which showed that the patient has an  FEV-I of 1.91 liters, or 77% of predicted, with an FVC of 3.21 liters,  or 101% predicted, FEV-I/FVC ratio 60%.  The patient has also diminished  mid-flows manifested by an FEF of 2575, which is 24% of predicted.  These are consistent with obstructive airway disease with small airway  component, consistent with the patient's history of asthma.  He likely  does have some chronic obstructive pulmonary disease associated with it  as well.   We did review the chest x-ray attached that the patient brought from his  primary care physician, which shows that the patient has some  peribronchial cuffing and some mild chronic obstructive pulmonary  disease changes.   IMPRESSION:  1. Dyspnea, which I believe is multifactorial:  I am concerned with      the patient's edema and appearance of volume overload.  The patient      also has significant persistent bronchial obstruction, and in      addition he appears to have some issues with potential sleep apnea,      which may be aggravating potential diastolic dysfunction and      further aggravate his volume overload issues.  2. Volume overload, as manifested by edema:  In this regard, I am also      concerned about the patient being on Actos, versus potential      diastolic dysfunction.  3. Possible obstructive sleep apnea.   PLAN:  1. To obtain a full set of PFTs.  The patient will have these on      followup in three to four weeks' time. 2. The patient will also undergo a sleep study.  3. I did  recommend that the patient stop Singulair and theophylline      and consider having Actos removed from his medication list.  4. I do want the patient to remain on the Symbicort  and Spiriva.  We      did reiterate the proper use of these inhalers for the patient.  5. I would like the patient to follow up with my partner, Dr. [name], as I will be leaving the practice.  His issues of potential      sleep apnea and followup with full PFTs will be done at that time,      to determine if any other interventions are necessary.  6. The patient is to call us prior to his follow-up appointment,      should any difficulties arise.     Renold Don, MD  Electronically Signed    CLG/MedQ  DD: 07/14/2006  DT: 07/16/2006  Job #: [id number]   cc:   Ermalene Searing. Philip Aspen, M.D.

## 2010-09-17 NOTE — Assessment & Plan Note (Signed)
[name]                             PULMONARY OFFICE NOTE   NAME:[name], [name]                        MRN:          [id number]  DATE:08/02/2006                            DOB:          [birth year]    I met [name] today for evaluation of his sleep difficulties as well as  his dyspnea.   He was seen by Dr. Patsey Berthold initially on July 12, 2006, and there was a  concern that he had COPD, volume overload and possible obstructive sleep  apnea.   Since then, he had undergone an overnight polysomnogram July 17, 2006,  and this showed evidence for moderate obstructive sleep apnea with an  apnea/hypopnea index of 24 and oxygen saturation of 89%. He did have a  significant positional component to his sleep apnea. He was also noted  to have central apneic events with sleep onset.   In addition, he had undergone pulmonary function tests on August 01, 2006,  and his post-bronchodilator FEV1 to [facility] ratio was 45%. The FEV1 was 1.3  liters which was 54% of predicted. The FVC was 2.89 liters which was 79%  of predicted. There was a significant bronchodilator response. His total  lung capacity was 4.98 which was 91% of predicted. His diffusing  capacity was 9.5 which was 55% of predicted but corrected for lung  volumes was 104% of predicted.   He says that he had stopped exercising recently due to symptoms of  dyspnea, and as a result, he had gained approximately 10 pounds. He says  that his wife had noticed that over that time period he started snoring  again. He tends to breath more through his mouth, and he may grind his  sleep while he is asleep. He has difficulty sleeping on his back. He  wakes up 4 to 5 times during the night to use the bathroom. He will  typically go to sleep at around 11:00 at night. He has no trouble  falling asleep. He wakes up at about 7 in the morning but still feels  somewhat tired during the day. He will typically take a nap in  the  afternoon which he says seems to help. His Epworth score today is 2/24.  He denies having any headaches in the morning. There is no history of  sleep walking, sleep talking, nightmares or night terrors, although he  used to have a history of sleep walking as a child. He does occasionally  get a jerking sensation in his leg when he is sleeping but denies any  history of restless leg syndrome. He denies any history of sleep  hallucinations, sleep paralysis or cataplexy. He is not currently using  anything to help him fall asleep at night. He drinks 2 cups of coffee in  the morning and will occasionally have a cup of tea in the afternoon.   His past medical history is significant for:  1. Hypertension.  2. Coronary artery disease status post stenting with Dr. Mertie Moores.  3. Asthma.  4. Diabetes.  5. Chronic rhinitis.  Past surgical history is significant for:  1. Total knee replacement in April of 2006.  2. Cardiac stenting in 2005.  3. Colonoscopy, endoscopy.  4. Spinal surgery in 2004.  5. Sigmoidoscopy.   HE HAS AN ALLERGY TO SULFA MEDICATIONS.   CURRENT MEDICATIONS:  1. Albuterol 3 times daily by nebulizer.  2. Symbicort 160/4.5 two puffs b.i.d.  3. Spiriva 1 puff daily.  4. Actos 15 mg daily.  5. Singulair 10 mg daily.  6. Nexium 40 mg daily.  7. Benicar 40 mg daily.  8. Lipitor 20 mg daily.  9. Plavix 75 mg daily.  10.Loratadine 10 mg daily.  11.Cardizem LA 180 mg daily.  12.Lasix 20 mg daily.  13.Ferrex 150 two tablets daily.  14.Vitamin C 500 mg 2 tablets daily.  15.Theophylline 200 mg daily.  16.Metformin 500 mg daily.  17.Vesicare 5 mg daily.  18.Aspirin 81 mg daily.  19.Metoclopramide 5 mg every night.   FAMILY HISTORY:  Significant for his father and brother with asthma.   SOCIAL HISTORY:  He smoked a half a pack of cigarettes a day for  approximately 32 years, quit 18 years ago. Has occasional alcohol use.  He is married and lives with his  wife. 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   REVIEW OF SYSTEMS:  He complains of dyspnea on exertion. Has  intermittent episodes of coughing as well as rhinorrhea with post-nasal  drip.   PHYSICAL EXAMINATION:  He is 5 feet 6 inches tall, 160 pounds.  Temperature 97.9, blood pressure 124/62, heart rate is 95, oxygen  saturation 96% on room air.  HEENT:  Pupils are reactive. Extraocular muscles are intact. There is no  sinus tenderness. He has clear nasal discharge. He has a Mallampati II  airway with decreased AP diameter of the oropharynx. There is no  lymphadenopathy. No thyromegaly.  HEART:  Was S1 and S2. Regular rate and rhythm.  CHEST:  Decreased breath sounds, prolonged expiratory phase but no  wheezing.  ABDOMEN:  Soft, nontender, positive bowel sounds.  EXTREMITIES:  There was minimal ankle edema.  NEUROLOGICAL:  No focal deficits were appreciated.   IMPRESSION:  1. Moderate obstructive sleep apnea as demonstrated by an      apnea/hypopnea index of 23.8 and oxygen saturation of 89%. I have      reviewed the results of his sleep study with him. I discussed the      adverse consequences of untreated sleep apnea including the risk of      hypertension, coronary disease, cerebrovascular disease, diabetes      and arrythmias. I discussed the importance with him of diet,      exercise and weight reduction as well as the avoidance of alcohol      and sedatives. Driving precautions were discussed with him as well.      I reviewed various treatment options for sleep apnea including CPAP      therapy, oral appliance and surgical intervention. At this time, I      recommend that he undergo CPAP therapy. He has opted to undergo an      auto titration over a two-week period to determine the pressure      setting for his CPAP. If he has difficulty with this, then we could     certainly refer him back to sleep lab for an in-lab CPAP titration      study.  2. Central apneic events with  sleep onset. This can be a normal  phenomenon, although if he is having difficulty with sleep      initiation or sleep maintenance after control of his sleep apnea,      then further interventions may be necessary for this.  3. Chronic rhinitis. I have instructed him on the use of nasal      irrigation. I have also given him a sample and prescription for      Veramyst which he is to use 2 sprays in each nostril once daily for      the next several weeks to see if this improves his symptoms.  4. Severe obstructive disease with emphysematous component with      significant bronchodilator response based on review of his      pulmonary function tests. I have suggested that he continue on his      current dose of Symbicort and Spiriva and that he can use his      albuterol on an as-needed basis. I have advised him to again      discontinue the use of his theophylline. I will continue him on his      Singulair for now, but the plan would be to possibly discontinue      this depending upon his symptom status. Additionally, I will make      arrangements for him to undergo pulmonary rehabilitation program.   I will follow up with him in approximately 4 to 6 weeks.     [name], MD  Electronically Signed    VS/MedQ  DD: 08/02/2006  DT: 08/02/2006  Job #: [id number]   cc:   Renold Don, MD  Ermalene Searing. Philip Aspen, M.D.

## 2010-09-17 NOTE — Discharge Summary (Signed)
[name], [name]                 ACCOUNT NO.:  [id number]   MEDICAL RECORD NO.:  [id number]          PATIENT TYPE:  INP   LOCATION:  4710                         FACILITY:  [facility]   PHYSICIAN:  Ermalene Searing. Philip Aspen, M.D.DATE OF BIRTH:  Jan 24, 1929   DATE OF ADMISSION:  12/06/2006  DATE OF DISCHARGE:  12/08/2006                               DISCHARGE SUMMARY   PERTINENT FINDINGS:  The patient is a 77 year old man who is well known  to me.  He felt fine early this morning as he had breakfast with his  son, who is a physician with our practice.  After breakfast and taking  his usual medications, he developed marked lightheadedness followed by  nausea and vomiting.  He denied recent fever, chills, vision change,  headaches, chest pain, palpitations, constipation, or diarrhea.  He had  been seen in our office on November 27, 2006, with vague abdominal pain and  a normal abdomen exam with a CBC significant for white blood cell count  of 15.6 with a left shift, so he was empirically started on Augmentin  for 10 days.  He also had minimal rectal bleeding at that time, so his  aspirin was on hold; however, he continued his Plavix treatment.  At his  office evaluation, he had several episodes of bilious vomiting, so he  was admitted to the hospital for further evaluation.   PAST MEDICAL HISTORY:  1. Asthma, which is moderate and persistent.  2. Seasonal allergic rhinitis.  3. Gastroesophageal reflux disease with hiatal hernia.  4. In 2004, esophageal stricture dilatation.  5. Coronary artery disease status post 2005 PTCA and stent of the      right coronary artery.  6. Hyperlipidemia.  7. Diabetes mellitus, type 2, which has been under good control with      most recent hemoglobin A1c level 7.2% in May 2008.  8. Anemia with 2006 workup for GI blood loss that included EGD,      colonoscopy, and small-bowel follow-through that were unremarkable.  9. 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  10.Osteoarthritis of the hips, knees, and lumbar spine.  11.Benign prostatic hypertrophy with an overactive bladder.   MEDICATIONS PRIOR TO ADMISSION:  1. Spiriva 18 mcg inhalation once daily.  2. Singulair 10 mg daily.  3. Albuterol nebulizer treatments as needed.  4. Symbicort 160/4.5 one to two inhalations twice daily.  5. Theo-Dur 200 mg daily.  6. Actos 15 mg daily.  7. Glucophage XR 500 mg daily.  8. Lipitor 20 mg daily.  9. Nu-Iron 150, take 1 tablet twice daily.  10.Nexium 40 mg daily.  11.Vesicare 5 mg daily.  12.Enteric-coated aspirin 325 mg, on hold.  13.Plavix 75 mg daily.  14.Mobic 15 mg daily p.r.n. pain.  15.Claritin 10 mg daily.  16.Benicar 40 mg daily.  17.Lasix 20 mg daily.  18.Cardizem CD 180 mg daily.  19.Reglan 5-10 mg p.o. nightly p.r.n. bloating   ALLERGIES:  SULFA DRUGS.   DRUG INTOLERANCES:  1. GUAIFENESIN.  2. LABETALOL has been associated with very low blood pressures.  3. FLOMAX.   INITIAL PHYSICAL EXAM:  VITAL SIGNS:  Blood pressure 142/72, pulse 90,  respirations 20, temperature 97.8.  GENERAL:  He is a well-nourished, well-developed man who was initially  in no apparent distress, but subsequently had several episodes of  bilious vomiting.  HEENT: Exam was within normal limits. Extraocular movements were intact.  NECK:  Supple without jugular venous distension or carotid bruit.  CHEST:  Bilateral minimal scattered wheezing which is his baseline exam.  HEART:  Regular rate and rhythm without significant murmur or gallop.  ABDOMEN:  Normal bowel sounds and no hepatosplenomegaly or tenderness.  EXTREMITIES:  Without cyanosis, clubbing, or edema.  NEUROLOGICAL EXAM:  He is alert and oriented x3. Cranial nerves II-XII  were significant for decreased hearing bilaterally.  He had intact  finger-to-nose testing bilaterally.  He had a  wide-based gait that  required standby assistance.  He was unable to do a tandem gait.  A  modified Barany-Nylen test was normal.   INITIAL LABORATORY STUDIES:  White blood cell count 13.2 with 71%  granulocytes, hemoglobin 12, hematocrit 37, platelets 289.  Serum sodium  136, potassium 3.8, chloride 100, carbon dioxide 24, BUN 18, creatinine  1.0, glucose 166.   A CT scan of the head without contrast showed no acute abnormalities.   EKG showed normal sinus rhythm.   HOSPITAL COURSE:  The patient was admitted to a medical bed with  telemetry.  He was seen by neurology consultants who felt that his  symptoms were due to small right cortical or brainstem infarct from  small vessel disease.  Since he did have metallic artifact in the right  ear from previous surgery, he recommended a carotid ultrasound exam,  transcranial Doppler studies, 2-D echocardiogram, and a CT angiogram of  the hand.   Imaging studies showed the following.   CT scan of the head without contrast showed no acute changes with  postoperative changes in the right mastoid air cells and a tiny metallic  density.  There were also signs of chronic sinus disease.   A carotid ultrasound exam showed no significant ICA stenosis and  antegrade vertebral artery flow.  There was severe plaque in the right  internal carotid artery and minimal plaque in the left internal carotid  artery.   A transthoracic echocardiogram showed normal left ventricular systolic  function with ejection fraction 55-60%, no regional wall motion  abnormalities, and aortic valve sclerosis without significant stenosis.   A CT angiogram of the head and neck showed the following.  The aortic  arch and proximal great vessels were grossly unremarkable.  The common  carotid arteries and subclavian arteries were widely patent as well as  the vertebral arteries with the left dominant.  There was no evidence  for significant carotid bifurcation stenosis in  the cervical internal  carotid arteries bilaterally.  Within the head, both vertebral arteries  contributed to the formation of the basilar artery.  There was no  evidence for basilar artery stenosis.  The carotid siphons were widely  patent.  There was mild cavernous calcification which appeared  nonstenotic.  There was no visible intracranial stenotic disease and no  abnormal post contrast enhancement.  The overall impression was  unmarkable MR angiography of the intracranial circulation.   COMPLICATIONS:  None.   CONDITION ON DISCHARGE:  On the day of discharge, he had  been walking  his room and eating with no difficulty.  He continued to have mild  dyspnea on exertion.  Most recent vital signs include blood pressure  153/63, pulse 78, respirations 18, temperature 97.6, pulse oxygen  saturation 95% on room air. His chest has decreased and bilateral  scattered wheezing.  Heart has a regular rate and rhythm without  significant murmur or gallop.  Neurological exam:  He was alert and  oriented x4.  He was able to change from a sitting position to a  standing position and walk in his room with a turnaround independently.   Most recent laboratory tests include serum sodium 132, potassium 3.8,  BUN 14, creatinine 1.06, glucose 158.  Total bilirubin 2.3, alkaline  phosphatase 78, albumin 3.8. Hemoglobin A1c 7.0%.  Total cholesterol  101, triglycerides 47, HDL 45, LDL 47. BNP 65.  CK with CK-MBs were as  follows, 111/5.9 and 170/6.1 and 16/4.4.  Serial troponin I levels were  0.02, 0.01, and 0.01.   DISCHARGE DIAGNOSES:  1. Transient ischemic attack without residual neurologic deficits.  2. Asthma, moderate and persistent.  3. Diabetes mellitus, type 2, controlled.  4. Hyperlipidemia.  5. History of anemia.  6. Gastroesophageal reflux disease.  7. Overactive bladder.  8. Osteoarthritis.  9. Seasonal allergic rhinitis.  10.Hypertension, essential, controlled  11.Obstructive sleep  apnea.   DISCHARGE MEDICATIONS:  1. Ecotrin 81 mg p.o. daily.  2. Aggrenox one capsule p.o. nightly for 14 days, then twice daily.  3. Discontinue Plavix.  4. Prednisone 10 mg 1 tablet p.o. daily for 10 days.  5. Spiriva mcg daily.  6. Singulair 10 mg daily.  7. Symbicort 160/4.5 two puffs twice daily.  8. Albuterol nebulizer treatments four times daily as needed.  9. Theo-Dur 200 mg daily.  10.Actos 15 mg daily.  11.Glucophage XR 500 mg daily.  12.Lipitor 20 mg daily.  13.Nu-Iron 150 twice daily.  14.Nexium 40 mg daily.  15.Vesicare 5 mg daily.  16.Mobic 15 mg daily p.r.n. pain.  17.Claritin 10 mg daily p.r.n. rhinitis.  18.Benicar40 mg p.o. daily.  19.Lasix 20 mg p.o. daily.  20.Cardizem CD 180 mg daily.  21.Reglan 5-10 mg p.o. nightly p.r.n. bloating   DISPOSITION AND FOLLOWUP:  The patient was to be discharged to home.  He  should have a followup with Dr. Leanna Battles at [facility] within 1 week.  He should have a followup exam with Dr.  Antony Contras in approximately 1 month following discharge.           ______________________________  Ermalene Searing. Philip Aspen, M.D.     DGP/MEDQ  D:  12/12/2006  T:  12/13/2006  Job:  [id number]   cc:   Thayer Headings, M.D.  Pramod P. Leonie Man, MD

## 2010-09-17 NOTE — H&P (Signed)
[name], [name]                 ACCOUNT NO.:  [id number]   MEDICAL RECORD NO.:  [id number]          PATIENT TYPE:  INP   LOCATION:  NA                           FACILITY:  [facility]   PHYSICIAN:  Gaynelle Arabian, M.D.    DATE OF BIRTH:  1928-07-15   DATE OF ADMISSION:  08/23/2004  DATE OF DISCHARGE:                                HISTORY & PHYSICAL   CHIEF COMPLAINT:  Right knee pain.   HISTORY OF PRESENT ILLNESS:  The patient is a 77 year old male who has been  seen by Dr. Pilar Plate Aluisio for right knee pain. He has known history of end-  stage arthritis which has been intractable to nonoperative management  including injections. He has most recently undergone Synvisc injection which  has only helped for a little while. He continues to have a fair amount of  pain with his knee. It is felt that he has reached a point where the most  successful way of improving his pain and function would be a total knee  arthroplasty. The risks and benefits have been discussed with the patient  and he elected to proceed surgery.   ALLERGIES:  SULFA medication causes hives.   CURRENT MEDICATIONS:  1.  Pulmicort 200 mg 2 puffs q.d.  2.  Singulair 10 mg q.d.  3.  Nexium 40 mg q.d.  4.  Diovan/HCT 80/12.5 mg b.i.d.  5.  Mobic 15 mg q.d. (stopped prior to surgery).  6.  Lipitor 20 mg (stopped prior to surgery).  7.  Toprol XL 25 mg b.i.d.  8.  Fexofenadine 180 mg q.d.  9.  Vicodin p.r.n.  10. He also uses over-the-counter medication including glucosamine and      chondroitin and a fiber laxative supplement.   PAST MEDICAL HISTORY:  1.  Coronary arterial disease.  2.  Hypertension.  3.  History of asthma.  4.  Reflux disease.   PAST SURGICAL HISTORY:  1.  Cardiac catheterization with stenting in March of 2005.  2.  Spine surgery.  3.  Knee surgery in 2003.  4.  Colonoscopy.  5.  Right ear surgery approximately 20-30 years ago with a tenotomy of the      right ear drum.   SOCIAL HISTORY:   Married, retired Network engineer. Smoker about 15 years ago. No  alcohol. Has four children. The wife will be assisting with care after  surgery.   FAMILY HISTORY:  Both parents passed away when he was young. Father deceased  at age 82 with asthma. Has a brother with asthma.   REVIEW OF SYMPTOMS:  GENERAL:  No fevers, chills, night sweats.  NEUROLOGICAL:  No seizures, syncope, or paralysis. RESPIRATORY:  History of  asthma under good control. No shortness of breath, productive cough or  hemoptysis. CARDIOVASCULAR:  No chest pain, angina, or orthopnea. GI:  No  nausea, vomiting, diarrhea, or constipation. GU:  He does have some urinary  frequency. No dysuria, hematuria, discharge. MUSCULOSKELETAL:  Knee found in  the history of present illness.   PHYSICAL EXAMINATION:  VITAL SIGNS:  Pulse 56, respiration 12, blood  pressure 120/70.  GENERAL:  A 77 year old thin-framed male, slender build. He is alert,  oriented, cooperative, and pleasant at time of exam. Appears to be an  excellent historian. He is accompanied by his wife.  HEENT:  Normocephalic and atraumatic. Pupils are equal, round, and reactive.  Oropharynx clear. EOMs intact. He does have a hearing aide in the right ear.  NECK:  Supple. No carotid bruits.  CHEST:  Faint expiratory breeze in the right mid-pulmonary field. Otherwise  pulmonary fields are clear anterior and posterior chest walls.  HEART:  Regular rate and rhythm. No murmurs. Bradycardic rhythm with a pulse  at 56.  ABDOMEN:  Soft, flat, nontender. Bowel sounds are present.  RECTAL:  Not done, not pertinet to present illness.  BREASTS:  Not done, not pertinent to present illness.  GENITALIA:  Not done, not pertinent to present illness.  EXTREMITIES:  Right knee has no effusion. Range of motion 5 ot 120 degrees.  Significant varus malalignment deformity. No instabilities. Tender more  medial than lateral.   IMPRESSION:  1.  Osteoarthritis right knee.  2.  Coronary  arterial disease.  3.  Hypertension.  4.  Asthma.   PLAN:  The patient is admitted to [facility] to undergo a right  total knee arthroplasty. Surgery will be performed by Dr. Gaynelle Arabian. His  medical doctors are Dr. Ermalene Searing. [place] and Dr. Thayer Headings. Both  will be notified of the room on admission and will be consulted if needed  for medical assistance with the patient throughout the hospital course.      ALP/MEDQ  D:  08/22/2004  T:  08/22/2004  Job:  [id number]   cc:   Gaynelle Arabian, M.D.  [facility]  [address]  [place] [address]  Fax: [place]. Philip Aspen, M.D.  [address]  [place]  [place] [address]  Fax: [phone]   Thayer Headings, M.D.  [id number] [place]  [place] [address]  Fax: [phone]

## 2010-09-17 NOTE — Op Note (Signed)
[name], [name]                           ACCOUNT NO.:  [id number]   MEDICAL RECORD NO.:  [id number]                   PATIENT TYPE:  INP   LOCATION:  3006                                 FACILITY:  [facility]   PHYSICIAN:  Hosie Spangle, M.D.            DATE OF BIRTH:  November 06, 1928   DATE OF PROCEDURE:  10/16/2002  DATE OF DISCHARGE:                                 OPERATIVE REPORT   PREOPERATIVE DIAGNOSIS:  Lumbar stenosis.   POSTOPERATIVE DIAGNOSIS:  Lumbar stenosis.   OPERATION PERFORMED:  L4 and L5 lumbar laminectomy with microdissection.   SURGEON:  Hosie Spangle, M.D.   ANESTHESIA:  General endotracheal.   INDICATIONS FOR PROCEDURE:  The patient is a 77 year old man who presented  with neurogenic claudication as well as weakness in the distal left lower  extremity.  Myelogram and post-myelogram CT scan revealed significant lumbar  stenosis at L4-5 with possible disk herniation at L4-5.  A decision was made  to proceed with lumbar laminectomy and possible microdiskectomy.   DESCRIPTION OF PROCEDURE:  The patient was brought to the operating room and  placed under general endotracheal anesthesia.  The patient was turned to a  prone position and the lumbar region was prepped with DuraPrep and draped in  a sterile fashion.  The midline was infiltrated with local anesthetic with  epinephrine.  An x-ray was taken.  The L4-5 level identified and the midline  incision made, carried down through the subcutaneous tissue.  Bipolar  cautery and electrocautery were used to maintain hemostasis.  Dissection was  carried down to the lumbar fascia which was incised bilaterally and the  paraspinous muscles were dissected from the spinous processes and lamina in  a subperiosteal fashion.  A self-retaining retractor was placed.  An x-ray  was taken and the L4-5 interlaminar space was identified.  Laminectomy was  initiated with double action rongeur and continued with [facility] Max  drill and  Kerrison punches.  The operating microscope was draped and brought into the  field to provide additional magnification illumination and visualization and  the laminectomy itself was performed using microdissection and microsurgical  technique.  There was severe stenosis at the L4-5 level and particular care  was taken to avoid injury to the underlying thecal sac.  We were able to  perform the laminectomy without any dural injury.  There was marked ligament  thickening with partial calcification along with thickening and hypertrophy  of the facet complex.  We were able to decompress the thecal sac and the L4,  L5 and S1 nerve roots bilaterally.  We then carefully examined the left L4-5  epidural space and although there is certainly degenerative disk disease and  spondylosis, we did not find distinct disk herniation and it was felt that  good decompression of the thecal sac and nerve roots had been achieved and  therefore, no diskectomy was performed.  The  wound was irrigated with  bacitracin solution throughout the procedure but particularly at the end of  the procedure.  Once the decompression was completed, the edges of the bone  were smoothed off and waxed.  Gelfoam soaked in thrombin was placed in the  laminectomy defect.  Hemostasis was established.  We proceeded with closure.  The paraspinal muscles were reapproximated with interrupted undyed 1 Vicryl  sutures, the deep fascia closed with interrupted undyed 1 Vicryl sutures.  The subcutaneous and subcuticular layer were closed with interrupted  inverted 2-0 undyed Vicryl sutures.  The skin edges were reapproximated with  DermaBond.  The patient tolerated the procedure well.  The estimated  blood loss for this procedure was 145mL.  Sponge, needle and instrument  counts were correct.  Following surgery, the patient was to be turned back  to supine position, reversed from anesthetic, extubated and transferred to  the recovery  room for further care.                                                Hosie Spangle, M.D.    RWN/MEDQ  D:  10/16/2002  T:  10/16/2002  Job:  [id number]

## 2010-09-17 NOTE — Discharge Summary (Signed)
NAMEORY, DONISI                 ACCOUNT NO.:  [id number]   MEDICAL RECORD NO.:  [id number]          PATIENT TYPE:  INP   LOCATION:  0466                         FACILITY:  [facility]   PHYSICIAN:  Gaynelle Arabian, M.D.    DATE OF BIRTH:  25-Feb-1929   DATE OF ADMISSION:  08/23/2004  DATE OF DISCHARGE:  08/27/2004                                 DISCHARGE SUMMARY   ADMISSION DIAGNOSES:  1.  Osteoarthritis right knee.  2.  Coronary arterial disease.  3.  Hypertension.  4.  Asthma.   DISCHARGE DIAGNOSES:  1.  Osteoarthritis right knee status post right total knee arthroplasty.  2.  Mild postoperative blood loss anemia did not require transfusion.  3.  Postoperative hyponatremia improving.  4.  Hyperglycemia postop.  5.  Coronary arterial disease.  6.  Hypertension.  7.  Asthma.   PROCEDURE:  August 23, 2004 right total knee arthroplasty; surgeon, Gaynelle Arabian, M.D.; assistant, Arlee Muslim, P.A.-C.  Spinal anesthesia, minimal  blood loss, hemovac drain x1, tourniquet time 41 minutes at 300 mmHg.   CONSULTATIONS:  Medicine consult, Ermalene Searing. Philip Aspen, M.D.   BRIEF HISTORY:  [name] is a 77 year old male with severe end-stage  arthritis of the right knee. The pain is intractable to nonoperative  management including injections and now presents for right total knee  arthroplasty.   LABORATORY DATA:  CBC preop, hemoglobin of 12.9, hematocrit 39.4, white cell  count 9.0, differential within normal limits with the exception of elevated  eos of 9, postoperative hemoglobin 10.8, last noted H&H 9.7 and 29.2.  PT,  PTT preop 12.5 and 29 respectively and INR of 0.9. Serial pro times  followed. Last noted PT/INR 25.6 and 3.4. Chem panel on admission, elevated  potassium of 5.3, remaining chem panel within normal limits. Serial BMET's  are followed. Potassium came down to a normal level last noted at 4.8.  Sodium did drop from 137 down to 128 back up to 131. Glucose went up from  117 up to  172 back down to 151. Urinalysis preop negative. Followup UA small  hemoglobin, small leukocyte esterase, 0-2 red and 0-2 white cells. Blood  group type B positive, urine culture taken no growth.   Two view chest on August 17, 2004 no acute cardiopulmonary findings. Postop  chest x-ray on August 25, 2004 low volume chest film with vascular crowding  and bibasilar atelectasis.   HOSPITAL COURSE:  Admitted to [facility], taken to the OR and  underwent the above stated procedure without complications. The patient  tolerated the procedure well, later sent to the recovery room then the  orthopedic floor for continued postoperative care. Vital signs were  followed, a medical consult was called in to Ermalene Searing. Philip Aspen, M.D., the  patient's medical physician, to assist with medical management. The patient  did fairly well on the evening of surgery, not too bad on day one when  seen on rounds by Dr. Wynelle Link. Hemovac drain was pulled on day one. Started  to get up with physical therapy by day two. He had developed some  hiccups  after surgery but that had resolved by day two. Had a little bit of postop  fever likely due to atelectasis. Urine and chest x-ray was ordered as above.  Started to get up with physical therapy. By day three, the patient was  resting comfortably, was sleeping a little bit better, hiccups had improved.  He started ambulating a little bit further with physical therapy after  ambulating approximately 48 feet and then 100 feet later that day. He did  have some hyponatremia which was followed very closely by Dr. Philip Aspen and  also some hyperglycemia which was also noted and followed by Dr. Philip Aspen.  The incision continued to heal well. The patient progressed with his therapy  quite well. By day three, he was getting better each day, continued to work  with therapy and by day four on August 27, 2004, he had progressed well with  therapy tolerating his meds and was ready  to be discharged home.   PLAN:  1.  The patient was discharged home on August 27, 2004.  2.  Discharge diagnoses please see above.  3.  Discharge meds Coumadin, Percocet and Robaxin.  4.  Activity, full weightbearing and weightbearing as tolerated. Home health      PT and home health nursing, total knee protocol, may start showering.  5.  Followup on May 9, call for an appointment.  6.  Diet, cardiac diet.   DISPOSITION:  Home.   CONDITION ON DISCHARGE:  Improved.      ALP/MEDQ  D:  10/01/2004  T:  10/01/2004  Job:  [id number]   cc:   Ermalene Searing. Philip Aspen, M.D.  [address]  [place]  [place] [address]  Fax: [phone]   Thayer Headings, M.D.  [id number], [address] [place]  [place] [address]  Fax: [phone]

## 2010-09-17 NOTE — Consult Note (Signed)
[name], [name]                 ACCOUNT NO.:  [id number]   MEDICAL RECORD NO.:  [id number]          PATIENT TYPE:  INP   LOCATION:  0466                         FACILITY:  [facility]   PHYSICIAN:  Ermalene Searing. Philip Aspen, M.D.DATE OF BIRTH:  Jul 03, 1928   DATE OF CONSULTATION:  DATE OF DISCHARGE:                                   CONSULTATION   INDICATIONS FOR CONSULTATION:  Perioperative management of coronary artery  disease, asthma, and hypertension.   REQUESTING PHYSICIAN:  Gaynelle Arabian, M.D.   HISTORY OF PRESENT ILLNESS:  Patient is a 77 year old man with a history of  coronary artery disease.  In March, 2005, PTCA and stent placement in the  right coronary artery, asthma, hypertension, who today had an uncomplicated  right total knee replacement due to end-stage osteoarthritis.  At this time,  he complains of mild right knee pain but needs to be encouraged to take his  Dilaudid PCA medication by family members.  He denies shortness of breath,  productive cough, chest pain, nausea, or constipation.   PAST MEDICAL HISTORY:  1.  Asthma with no emergency room treatments or hospitalizations.  2.  Gastroesophageal reflux disease with a 2004 stricture dilatation.  3.  Osteoarthritis affecting the hips, lumbar spine, and right knee.  4.  Seasonal allergic rhinitis.  5.  Hyperlipidemia.  6.  Bilateral sensory neural hearing loss.  7.  Colon polyps with an August, 2004 colonoscopy showing adenomas.  8.  Coronary artery disease with a March, 2005 cardiac catheterization      showing normal left ventricular systolic function and one-vessel      coronary artery disease in the right coronary artery.  At that time,      angioplasty with stent placement was performed without complications.      He has not had recurrent symptoms of coronary artery disease.  9.  Hypertension.   MEDICATIONS PRIOR TO ADMISSION:  1.  Pulmicort 2 puffs daily.  2.  Singulair 10 mg daily.  3.  Nexium 40 mg daily.  4.  Diovan/HCT 80/12.5 once daily.  5.  Mobic 15 mg once daily.  6.  Lipitor 20 mg daily.  7.  Toprol XL 25 mg twice daily.  8.  Allegra 180 mg daily.  9.  Vicodin 1-2 tablets 3 times daily as needed for pain.  10. Nu-Iron 150 1 tab daily.  11. Vesicare 5 mg every p.m.  12. Ecotrin 325 mg daily.  13. Niaspan 500 mg q.p.m.  14. Nitrostat 0.4 mg sublingual p.r.n. chest pain.  15. Neomycin otic solution to ears p.r.n. irritation.  16. Albuterol metered dose inhaler 2 puffs 3 times daily as needed for      wheezing or dyspnea.   ALLERGIES:  1.  SULFA DRUGS.  2.  GUAIFENESIN.  3.  LABETALOL has been associated with severe hypotension.   PAST SURGICAL HISTORY:  1.  Remote right ear implant in 2003.  2.  Right knee arthroscopy in 2002.  3.  Functional sinus surgery in 2004.  4.  Left L4-5 laminectomy, March 2005.  5.  PTCA and stent  in the right coronary artery.   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 He has had tobacco use of 30-  pack-years that was stopped in 1991 and has no history of alcohol abuse.   FAMILY HISTORY:  His father died at age 34 of complications of asthma, and  his mother died with childbirth.  He had two brothers and one sister who  have passed away.  He has three sons and one daughter and multiple  grandchildren.   REVIEW OF SYSTEMS:  See history of present illness.   LABORATORY STUDIES:  EKG showed normal sinus rhythm.  Within normal limits.  An August 02, 2004 Adenosine Cardiolite test showed normal left ventricular  function with no evidence of ischemia.   Complete blood count showed a white blood cell count of 9, hemoglobin 12,  hematocrit 39, platelets 256.  Serum sodium 137, potassium 5.3, chloride  101, CO2 30, BUN 13, creatinine 1, glucose 117, serum albumin 3.7.   Chest x-ray showed no acute cardiopulmonary disease.   IMPRESSION/PLAN:  1.  Coronary artery  disease:  Stable on his current medical regimen, which      will be continued during his hospitalization.  2.  Asthma:  Stable on his outpatient medical regimen.  3.  Hypertension:  Well-controlled on his current medical regimen.  4.  Right knee pain:  Appropriate following his surgery today.  I will add      regular dosing of MiraLax to prevent constipation.  He has other agents      ordered as needed for constipation.   Thank you for allowing me to participate in the care of this delightful  gentleman.      DGP/MEDQ  D:  08/23/2004  T:  08/23/2004  Job:  [id number]

## 2010-09-17 NOTE — Procedures (Signed)
[name], [name]                 ACCOUNT NO.:  [id number]   MEDICAL RECORD NO.:  [id number]          PATIENT TYPE:  OUT   LOCATION:  SLEEP CENTER                 FACILITY:  [facility]   PHYSICIAN:  [name], MD        DATE OF BIRTH:  1928/09/16   DATE OF STUDY:  07/17/2006                            NOCTURNAL POLYSOMNOGRAM   REFERRING PHYSICIAN:  Renold Don, M.D.   INDICATION FOR STUDY:  This is an individual who has a history of heart  disease and hypertension as well as sleep disruption and symptoms of  excessive daytime sleepiness.  He is referred to the sleep lab for  evaluation of hypersomnia with obstructive sleep apnea.  Epworth score  is 1.   MEDICATIONS:  Albuterol, Symbicort, Spiriva, Actos, Singulair, Nexium,  Benicar, Lipitor, Plavix, loratadine, Cardizem LA, furosemide, Ferrex,  vitamin C, Theophylline, Centrum Silver.  The patient took a Reglan on  the night of the study.   SLEEP ARCHITECTURE:  Total recording time was 386 minutes.  Total sleep  time was 285 minutes.  Sleep efficiency was 74% which is reduced.  Sleep  latency was 43.5 minutes which is prolonged.  The patient appeared to  have central apneic events with sleep onset in addition to having  respiratory events which appeared to disrupt sleep initiation.  REM  latency was 68 minutes which is slightly reduced.  The patient slept  predominantly in the non-supine position.   RESPIRATORY DATA:  The average respiratory rate was 14.  The  apnea/hypopnea index was 23.8.  There were 2 central apneic events; the  remainder of the events were obstructive in nature.  The supine apnea  hypopnea index was 77, although he only had 7 minutes of supine sleep.  The non-supine apnea hypopnea index was 22.4.  The REM apnea hypopnea  index was 3.2, the non-REM apnea hypopnea index was 23.8.  Moderate  snoring was noted by the technician.   OXYGEN DATA:  The baseline oxygenation was 98%.  The oxygen saturation  nadir was 89%.  The patient spent a total of 377 minutes of test time  with an oxygen saturation between 91-100% and 3.1 minutes with an oxygen  saturation between 81-90%.   CARDIAC DATA:  The average heart rate was 70 and the rhythm strip showed  normal sinus rhythm   MOVEMENT PARASOMNIA:  The periodic limb movement index was 7.4.  The  patient had one bathroom trip.   IMPRESSION:  This study shows evidence for moderate obstructive sleep  apnea as demonstrated by an apnea hypopnea index of 22.8 and oxygen  saturation nadir of 89%.  Of note is that the patient had a reduction in  the amount of supine sleep and, therefore, the severity of his sleep  apnea may be underestimated.  Additionally, he had  central apneic events with sleep onset.  While this may be a normal  finding, clinical correlation would be necessary to determine if this is  contributing to his sleep disturbance.      [name], MD  Diplomat, American Board of Sleep Medicine  Electronically Signed  VS/MEDQ  D:  07/26/2006 10:22:45  T:  07/26/2006 11:09:19  Job:  [id number]   cc:   Renold Don, MD  [address] [place], [place] [address]

## 2010-09-17 NOTE — Cardiovascular Report (Signed)
NAMETABARI, GOHL                           ACCOUNT NO.:  [id number]   MEDICAL RECORD NO.:  [id number]                   PATIENT TYPE:  INP   LOCATION:  3708                                 FACILITY:  [facility]   PHYSICIAN:  Thayer Headings, M.D.              DATE OF BIRTH:  Sep 12, 1928   DATE OF PROCEDURE:  DATE OF DISCHARGE:                              CARDIAC CATHETERIZATION   PROCEDURE:  Cardiac catheterization and PTCA stent note.   INDICATIONS FOR PROCEDURE:  Mr. Diener is a 77 year old gentleman with no  significant past cardiac history.  He was admitted to the hospital yesterday  with symptoms of unstable angina.  His EKG revealed some mild ST-segment  depression in the inferior leads with a minimal amount of ST segment  elevation in the lateral leads.  He was scheduled for a heart  catheterization today for further evaluation.   PROCEDURES:  Left-heart catheterization with coronary angiography, with PTCA  and stenting of the right coronary artery.   DESCRIPTION OF PROCEDURE:  The right femoral artery was easily cannulated  using the modified Seldinger technique.   LV pressure was 162/17 with an aortic pressure of 162/73.   ANGIOGRAPHY:  The left main coronary artery is relatively smooth and normal.   The left anterior descending artery has minor luminal irregularities.  There  is a slight amount of calcium in the proximal aspect of the vessel.  The  remainder of the vessel has minor luminal irregularities, but no significant  stenosis.  The first diagonal vessel is moderate-sized vessel.  There is a  very small degree of stenosis in the mid segment of this vessel of about  25%.   The left circumflex artery is a moderate-to-large vessel.  It gives off a  high obtuse marginal artery.  There is a 20-30% stenosis in the proximal  aspect of this first OM, but no critical stenosis.   The circumflex continues around the AV groove.  There is 10-20% stenosis in  the proximal  circumflex vessel.   The right coronary artery is large and dominant.  There is an ulcerated  plaque in the mid segment.  This plaque seems to extend to between 15 and 20  mm in length.  There is TIMI grade 3 flow through this area.   LEFT VENTRICULOGRAM:  A left ventriculogram was performed in a 30 RAO  position.  It revealed overall well-preserved left ventricular systolic  function.  There is a very small area of hypokinesis in the inferior wall,  but the overall LV function is normal.  The EF is 65%.  There is no mitral  regurgitation.   PTCA:  The patient was given 300 mg of Plavix followed by a double bolus  Integrilin drip, and heparin (Wydase).  The right coronary artery was  engaged using the 7-French Judkins right 4 side hole guide.  The right  coronary artery was  wired using a BMW guidewire.  A 3.0 x20 mm Quantum Mavik  was positioned across the stenosis and was inflated up to 4 atmospheres for  17 seconds.  This resulted in some improvement of the stenosis, but with  slight extension of the dissection plane.  At this point, a 3.0 x28 mm Taxus  stent was positioned across the stenosis.  It was deployed at 14 atmospheres  for 30 seconds.  Post-dilatation was achieved using a 3.25 x 15 mm Quantum  Mavik.  It was positioned distally and was inflated up to 12 atmospheres,  and then was pulled back proximally and was inflated up to 16 atmospheres.  This resulted in a very nice angiographic lumen with a 0% residual stenosis.  There was a very small RV marginal branch which was occluded during the  procedure.  This was an expected result because of the severe plaque burden  and the tenuous flow that the patient started with.   COMPLICATIONS:  None.   CONCLUSIONS:  1. Single-vessel coronary artery disease involving the right coronary     artery.  2. Successful PTCA and stenting of the right coronary artery.  3. Well-preserved left ventricular systolic function.                                                Thayer Headings, M.D.    PJN/MEDQ  D:  07/31/2003  T:  08/01/2003  Job:  [id number]   cc:   Valetta Fuller, Dr.

## 2010-09-17 NOTE — Discharge Summary (Signed)
[name], [name]                           ACCOUNT NO.:  [id number]   MEDICAL RECORD NO.:  [id number]                   PATIENT TYPE:  INP   LOCATION:  6523                                 FACILITY:  [facility]   PHYSICIAN:  Thayer Headings, M.D.              DATE OF BIRTH:  [birth year]   DATE OF ADMISSION:  07/30/2003  DATE OF DISCHARGE:  08/01/2003                                 DISCHARGE SUMMARY   ADMITTING DIAGNOSIS:  Unstable angina.   DISCHARGE DIAGNOSES:  1. Coronary artery disease - status post percutaneous transluminal coronary     angioplasty and stenting of his right coronary artery.  2. Hyperlipidemia.   DISCHARGE MEDICATIONS:  1. Enteric-coated aspirin 325 mg daily.  2. Plavix 75 mg daily.  3. Nitroglycerin 0.4 mg sublingually as needed.  4. Lipitor 40 mg daily.  5. The patient may resume his other medications as directed by Dr.     Sharlett Iles.   HISTORY:  Mr. Ackerman is a 77 year old gentleman with a new onset of chest  pain.  He is admitted for further evaluation.  Please see dictated H&P for  further details.   HOSPITAL COURSE BY PROBLEMS:  Coronary artery disease.  The patient  underwent heart catheterization and was found to have a tight stenosis in  his mid-right coronary artery.  He underwent successful PTCA and stenting  and of his right coronary artery using a 3.0 x 28 mm TAXUS stent.  It was  post-dilated up to 16 atmospheres for 35 seconds.  The patient tolerated the  procedure quite well, and did not have any further episodes of chest pain.  He will be discharged on the above-noted medications and disposition.   FOLLOW UP:  Further follow up will be per Dr. Sharlett Iles.                                                Thayer Headings, M.D.    PJN/MEDQ  D:  08/20/2003  T:  08/20/2003  Job:  [id number]   cc:   Valetta Fuller, M.D.

## 2010-09-17 NOTE — H&P (Signed)
[name], [name]                           ACCOUNT NO.:  [id number]   MEDICAL RECORD NO.:  [id number]                   PATIENT TYPE:  INP   LOCATION:  3708                                 FACILITY:  [facility]   PHYSICIAN:  [name], M.D.              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  He is admitted to the hospital today with  episodes of unstable angina and an abnormal EKG.   [name] is a 77 year old gentleman with a history of borderline anemia,  gastroesophageal reflux, and seasonal allergies.  He was seen for a  Cardiolite scan approximately one and a half years ago which was found to be  negative.  He had no evidence of ischemia, and he had normal left  ventricular systolic function.  Over the past month or so, he has been  having some exertional episodes of throat tightness.  These episodes occur  with his normal activities.  He describes a tightness in his throat with  slight radiation down to his chest.  He really never had any episodes of  chest discomfort, although he states that he has some occasional chest  heaviness that feels like there is a slight weight on his chest.  He has  never had any episodes of shortness of breath. He denies any syncope or  presyncope, and he denies any diaphoresis.  He works out on [employer] or  a stationary bicycle on a regular basis, and only recently has started  having some episodes of throat tightness.   CURRENT MEDICATIONS:  1. Nexium once a day.  2. Pulmicort as needed.  3. MOBIC once a day.  4. Singulair once a day.  5. Ditropan once a day.  6. Nu-Iron once a day.   ALLERGIES:  He is allergic to SULFA.   PAST MEDICAL HISTORY:  1. History of asthma.  2. History of gastroesophageal reflux disease.  3. History of  anemia.   SOCIAL HISTORY:  The patient is a retired Pharmacist, hospital.  He used to smoke but  quite approximately 13 years ago.  He drinks alcohol only occasionally.  He  works out regularly on a Warden/ranger or a stationary bike.   FAMILY HISTORY:  His father died at age 25 due to lung disease.  His mother  died due to unknown causes.   REVIEW OF SYSTEMS:  Was reviewed and is essentially negative.   PHYSICAL EXAMINATION:  GENERAL:  He is an elderly gentleman in no acute  distress, alert and oriented  x 3, and his mood and affect are normal.  VITAL SIGNS:  His weight is 156.  His blood pressure is 150/80 with a heart  rate  of 64.  HEENT:  Reveals 2+ carotids.  He has no bruits.  There is no JVD.  No  thyromegaly.  LUNGS:  Clear to auscultation.  HEART:  Regular rate, S1 S2 with no murmurs, gallops or rubs.  ABDOMEN:  Reveals good bowel sounds.  It was nontender.  EXTREMITIES:  He has no clubbing, cyanosis or edema.  NEUROLOGIC:  Nonfocal.   His EKG, today, reveals a normal sinus rhythm.  He has slight ST-T wave  abnormalities in the inferior and lateral leads.  There is a very slight  hint of ST segment elevation in lead aVL.  These EKG changes are new from  his Cardiolite scan from October 2003.   [name] presents with some symptoms of unstable angina and has a resting  EKG abnormality.  This is very suspicious for an unstable acute coronary  syndrome.  We will admit him to the hospital for initiation of heparin and  nitroglycerin therapy.  We will anticipate heart catheterization tomorrow.  We have discussed the risks and benefits and options of heart  catheterization.  He understands and agrees to proceed.                                                [name], M.D.    PJN/MEDQ  D:  07/30/2003  T:  07/31/2003  Job:  [id number]   cc:   [name]. [name], M.D.  [address]  [place]  [place] [address]  Fax: [phone]

## 2010-11-03 ENCOUNTER — Other Ambulatory Visit: Payer: Self-pay | Admitting: Cardiovascular Disease

## 2010-11-04 ENCOUNTER — Other Ambulatory Visit: Payer: Self-pay | Admitting: *Deleted

## 2010-11-04 MED ORDER — CLOPIDOGREL BISULFATE 75 MG PO TABS: 75.0000 mg | ORAL_TABLET | Freq: Every day | ORAL | Status: DC

## 2010-11-04 NOTE — Telephone Encounter (Signed)
Fax received from pharmacy. Refill completed. Jodette Shaymus Eveleth RN  

## 2011-02-02 ENCOUNTER — Ambulatory Visit: Payer: Medicare Other | Admitting: Pulmonary Disease

## 2011-02-07 LAB — COMPREHENSIVE METABOLIC PANEL
ALT: 19
AST: 19
Albumin: 3.5
Alkaline Phosphatase: 71
Calcium: 9.3
GFR calc Af Amer: >60
Potassium: 4.4
Sodium: 139
Total Protein: 6.3

## 2011-02-07 LAB — DIFFERENTIAL
Eosinophils Absolute: 1.9 — ABNORMAL HIGH
Eosinophils Relative: 15 — ABNORMAL HIGH
Lymphs Abs: 1.4
Monocytes Absolute: 0.9
Monocytes Relative: 7
Neutrophils Relative %: 68

## 2011-02-07 LAB — CARDIAC PANEL(CRET KIN+CKTOT+MB+TROPI)
Relative Index: INVALID
Relative Index: INVALID
Total CK: 78
Troponin I: 0.01

## 2011-02-07 LAB — CK TOTAL AND CKMB (NOT AT ARMC)
CK, MB: 5.9 — ABNORMAL HIGH
Relative Index: 5.1 — ABNORMAL HIGH

## 2011-02-07 LAB — PROTIME-INR: INR: 1

## 2011-02-07 LAB — CBC
MCHC: 33.3
Platelets: 264
RDW: 15.3

## 2011-02-07 LAB — TROPONIN I: Troponin I: 0.02

## 2011-02-07 LAB — POCT CARDIAC MARKERS: Myoglobin, poc: 150

## 2011-02-11 ENCOUNTER — Ambulatory Visit: Payer: Medicare Other | Admitting: Pulmonary Disease

## 2011-02-14 LAB — URINALYSIS, ROUTINE W REFLEX MICROSCOPIC
Glucose, UA: NEGATIVE
Hgb urine dipstick: NEGATIVE
Ketones, ur: NEGATIVE
Protein, ur: NEGATIVE
Urobilinogen, UA: 0.2

## 2011-02-14 LAB — CARDIAC PANEL(CRET KIN+CKTOT+MB+TROPI)
CK, MB: 5.9 — ABNORMAL HIGH
CK, MB: 6.1 — ABNORMAL HIGH
Relative Index: 3.6 — ABNORMAL HIGH
Total CK: 111
Total CK: 170
Troponin I: 0.01
Troponin I: 0.02

## 2011-02-14 LAB — COMPREHENSIVE METABOLIC PANEL
ALT: 22
AST: 63 — ABNORMAL HIGH
Calcium: 9.5
Creatinine, Ser: 1.06
GFR calc Af Amer: >60
GFR calc non Af Amer: >60
Sodium: 132 — ABNORMAL LOW
Total Protein: 6.6

## 2011-02-14 LAB — URINE CULTURE: Culture: NO GROWTH

## 2011-02-14 LAB — LIPID PANEL
HDL: 45
Total CHOL/HDL Ratio: 2.2
Triglycerides: 47
VLDL: 9

## 2011-02-14 LAB — THEOPHYLLINE LEVEL: Theophylline Lvl: 3.4 — ABNORMAL LOW

## 2011-02-14 LAB — B-NATRIURETIC PEPTIDE (CONVERTED LAB): Pro B Natriuretic peptide (BNP): 65

## 2011-03-07 ENCOUNTER — Ambulatory Visit (INDEPENDENT_AMBULATORY_CARE_PROVIDER_SITE_OTHER): Payer: Medicare Other | Admitting: Pulmonary Disease

## 2011-03-07 ENCOUNTER — Encounter: Payer: Self-pay | Admitting: Pulmonary Disease

## 2011-03-07 DIAGNOSIS — G4733 Obstructive sleep apnea (adult) (pediatric): Secondary | ICD-10-CM

## 2011-03-07 DIAGNOSIS — J449 Chronic obstructive pulmonary disease, unspecified: Secondary | ICD-10-CM

## 2011-03-07 NOTE — Assessment & Plan Note (Signed)
Encouraged him to use his CPAP as much as he can while asleep.

## 2011-03-07 NOTE — Patient Instructions (Signed)
Follow up in 6 months 

## 2011-03-07 NOTE — Assessment & Plan Note (Signed)
He is stable on his current regimen.  I explained that there may be overlap in his regimen since he is using albuterol several times per day and is on daily prednisone.  As such I explained he could try stopping symbicort.  He was reluctant to do this.  He would prefer to continue his current regimen.

## 2011-03-07 NOTE — Progress Notes (Signed)
Chief Complaint  Patient presents with  . Follow-up    Pt states his breathing has been "fine". Pt states he tries to wear his cpap everynight. Pt states jus depends on the day. Pt denies any complaints today    History of Present Illness:  CC: [name] is a 77 y.o. male with severe COPD and moderate OSA on CPAP 7 cm H2O.  His breathing has been doing better since he started daliresp.  He still uses prednisone on a daily basis.  He is using albuterol 2 to 3 times per day.  He continues using symbicort and spiriva.  He is using CPAP about 4 to 5 hours on most nights.  He sometimes wakes up and forgets to put it back on.  He was started on valtrex recently for shingles.  This has been getting better.    Past Medical History  Diagnosis Date  . TIA (transient ischemic attack) 12/2006    Dr. Leonie Man  . OSA (obstructive sleep apnea)   . COPD (chronic obstructive pulmonary disease)   . CAD (coronary artery disease)     stent 2005 by Dr. Acie Fredrickson  . Rhinitis   . Hypertension   . DM (diabetes mellitus)   . DJD (degenerative joint disease)   . Dyslipidemia   . Hiatal hernia   . Hemorrhoids   . Diverticulosis   . Overactive bladder   . Shingles     Past Surgical History  Procedure Date  . L5 laminectomy 2004    Dr. Sherwood Gambler  . Right total knee 2006    Dr. Maureen Ralphs    Current Outpatient Prescriptions on File Prior to Visit  Medication Sig Dispense Refill  . albuterol (PROVENTIL) (2.5 MG/3ML) 0.083% nebulizer solution Take 2.5 mg by nebulization 2 (two) times daily.        Marland Kitchen aspirin 81 MG tablet Take 81 mg by mouth daily.        Marland Kitchen atorvastatin (LIPITOR) 20 MG tablet Take 20 mg by mouth daily.        . budesonide-formoterol (SYMBICORT) 160-4.5 MCG/ACT inhaler Inhale 2 puffs into the lungs 2 (two) times daily.        . clopidogrel (PLAVIX) 75 MG tablet Take 1 tablet (75 mg total) by mouth daily.  90 tablet  1  . furosemide (LASIX) 20 MG tablet Take 20 mg by  mouth daily.        . iron polysaccharides (NIFEREX) 150 MG capsule Take 300 mg by mouth 2 (two) times daily.        Marland Kitchen loratadine (CLARITIN) 10 MG tablet Take 10 mg by mouth daily.        . montelukast (SINGULAIR) 10 MG tablet Take 10 mg by mouth at bedtime.        . Multiple Vitamins-Minerals (CENTRUM SILVER PO) Take 1 tablet by mouth daily.        . nitroGLYCERIN (NITROSTAT) 0.4 MG SL tablet Place 0.4 mg under the tongue every 5 (five) minutes as needed.        . pantoprazole (PROTONIX) 40 MG tablet Take 40 mg by mouth 2 (two) times daily.       . roflumilast (DALIRESP) 500 MCG TABS tablet Take 500 mcg by mouth daily.        . sitaGLIPtan-metformin (JANUMET) 50-500 MG per tablet Take 1 tablet by mouth 2 (two) times daily with a meal.        . Tamsulosin HCl (FLOMAX) 0.4 MG CAPS  Take 0.4 mg by mouth daily after supper.        . telmisartan (MICARDIS) 80 MG tablet Take 80 mg by mouth daily.        Marland Kitchen tiotropium (SPIRIVA) 18 MCG inhalation capsule Place 18 mcg into inhaler and inhale daily.        . vitamin C (ASCORBIC ACID) 500 MG tablet Take 500 mg by mouth daily.          Allergies  Allergen Reactions  . Sulfonamide Derivatives     Physical Exam:  Blood pressure 108/58, pulse 84, temperature 97.8 F (36.6 C), temperature source Oral, height 5\' 6"  (1.676 m), weight 148 lb 9.6 oz (67.405 kg), SpO2 97.00%.  General - Thin HEENT - no sinus tenderness, no oral exudate, no LAN Cardiac - s1s2 regular, no murmur Chest - prolonged exhalation, no wheeze/rales Abdomen - soft, nontender Extremities - no edema Neurologic - normal strength, CN intact Psychiatric - normal mood, behavior   Assessment/Plan:  CHRONIC OBSTRUCTIVE PULMONARY DISEASE, SEVERE He is stable on his current regimen.  I explained that there may be overlap in his regimen since he is using albuterol several times per day and is on daily prednisone.  As such I explained he could try stopping symbicort.  He was reluctant to  do this.  He would prefer to continue his current regimen.  OBSTRUCTIVE SLEEP APNEA Encouraged him to use his CPAP as much as he can while asleep.     Outpatient Encounter Prescriptions as of 03/07/2011  Medication Sig Dispense Refill  . albuterol (PROVENTIL) (2.5 MG/3ML) 0.083% nebulizer solution Take 2.5 mg by nebulization 2 (two) times daily.        Marland Kitchen allopurinol (ZYLOPRIM) 100 MG tablet Take 100 mg by mouth daily.        Marland Kitchen aspirin 81 MG tablet Take 81 mg by mouth daily.        Marland Kitchen atorvastatin (LIPITOR) 20 MG tablet Take 20 mg by mouth daily.        . budesonide-formoterol (SYMBICORT) 160-4.5 MCG/ACT inhaler Inhale 2 puffs into the lungs 2 (two) times daily.        . cetaphil (CETAPHIL) cream As needed for itching       . ciprofloxacin-dexamethasone (CIPRODEX) otic suspension As needed       . clopidogrel (PLAVIX) 75 MG tablet Take 1 tablet (75 mg total) by mouth daily.  90 tablet  1  . diltiazem (TIAZAC) 360 MG 24 hr capsule Once a day      . doxycycline (VIBRAMYCIN) 100 MG capsule As needed for foot infection       . furosemide (LASIX) 20 MG tablet Take 20 mg by mouth daily.        Marland Kitchen gabapentin (NEURONTIN) 300 MG capsule Take 300 mg by mouth 2 (two) times daily.        Marland Kitchen HYDROcodone-acetaminophen (VICODIN) 5-500 MG per tablet As needed for pain       . hydrOXYzine (ATARAX/VISTARIL) 25 MG tablet Take 25 mg by mouth as needed.        . iron polysaccharides (NIFEREX) 150 MG capsule Take 300 mg by mouth 2 (two) times daily.        Marland Kitchen loratadine (CLARITIN) 10 MG tablet Take 10 mg by mouth daily.        . montelukast (SINGULAIR) 10 MG tablet Take 10 mg by mouth at bedtime.        . Multiple Vitamins-Minerals (CENTRUM SILVER PO) Take 1 tablet  by mouth daily.        . nitroGLYCERIN (NITROSTAT) 0.4 MG SL tablet Place 0.4 mg under the tongue every 5 (five) minutes as needed.        . pantoprazole (PROTONIX) 40 MG tablet Take 40 mg by mouth 2 (two) times daily.       . predniSONE (DELTASONE) 5  MG tablet Take 2.5 mg by mouth daily as needed.        . roflumilast (DALIRESP) 500 MCG TABS tablet Take 500 mcg by mouth daily.        . sitaGLIPtan-metformin (JANUMET) 50-500 MG per tablet Take 1 tablet by mouth 2 (two) times daily with a meal.        . Tamsulosin HCl (FLOMAX) 0.4 MG CAPS Take 0.4 mg by mouth daily after supper.        . telmisartan (MICARDIS) 80 MG tablet Take 80 mg by mouth daily.        Marland Kitchen tiotropium (SPIRIVA) 18 MCG inhalation capsule Place 18 mcg into inhaler and inhale daily.        . vitamin C (ASCORBIC ACID) 500 MG tablet Take 500 mg by mouth daily.        Marland Kitchen DISCONTD: diltiazem (CARDIZEM CD) 180 MG 24 hr capsule Take 180 mg by mouth daily.        Marland Kitchen DISCONTD: pioglitazone (ACTOS) 15 MG tablet Take 15 mg by mouth daily.          Nyra Anspaugh 03/07/2011, 3:18 PM

## 2011-05-15 ENCOUNTER — Other Ambulatory Visit: Payer: Self-pay | Admitting: Cardiovascular Disease

## 2011-05-16 NOTE — Telephone Encounter (Signed)
Pt needs appointment then refill can be made Fax Received. Refill Completed. [name] (M.A)

## 2011-07-11 ENCOUNTER — Encounter: Payer: Self-pay | Admitting: *Deleted

## 2011-09-16 ENCOUNTER — Telehealth: Payer: Self-pay | Admitting: Cardiovascular Disease

## 2011-09-16 ENCOUNTER — Encounter: Payer: Self-pay | Admitting: *Deleted

## 2011-09-16 NOTE — Telephone Encounter (Signed)
Pt having colonoscopy on 5-20 and is on plavix, which he just told dr Watt Climes about, they need an ok to go off the plavix for the  weekend , pls call asap

## 2011-09-19 ENCOUNTER — Other Ambulatory Visit: Payer: Self-pay | Admitting: Gastroenterology

## 2011-09-22 ENCOUNTER — Other Ambulatory Visit: Payer: Self-pay | Admitting: Cardiovascular Disease

## 2011-09-23 ENCOUNTER — Telehealth: Payer: Self-pay | Admitting: Cardiovascular Disease

## 2011-09-23 NOTE — Telephone Encounter (Signed)
..   Requested Prescriptions   Pending Prescriptions Disp Refills  . clopidogrel (PLAVIX) 75 MG tablet [Pharmacy Med Name: CLOPIDOGREL BISULFATE TABS 75MG ] 90 tablet 0    Sig: TAKE 1 TABLET DAILY (NEED APPOINTMENT FOR MORE REFILLS)  Patient has appointment in july

## 2011-09-23 NOTE — Telephone Encounter (Signed)
Refill clopidogrel 75mg  [facility]

## 2011-11-15 ENCOUNTER — Encounter: Payer: Self-pay | Admitting: Pulmonary Disease

## 2011-11-15 ENCOUNTER — Ambulatory Visit (INDEPENDENT_AMBULATORY_CARE_PROVIDER_SITE_OTHER): Payer: Medicare Other | Admitting: Pulmonary Disease

## 2011-11-15 ENCOUNTER — Ambulatory Visit: Payer: Medicare Other | Admitting: Cardiovascular Disease

## 2011-11-15 VITALS — BP 108/58 | HR 92 | Temp 97.1°F | Ht 66.0 in | Wt 146.4 lb

## 2011-11-15 DIAGNOSIS — G4733 Obstructive sleep apnea (adult) (pediatric): Secondary | ICD-10-CM

## 2011-11-15 DIAGNOSIS — J449 Chronic obstructive pulmonary disease, unspecified: Secondary | ICD-10-CM

## 2011-11-15 NOTE — Patient Instructions (Signed)
Follow up in one year Call if help needed sooner

## 2011-11-15 NOTE — Assessment & Plan Note (Signed)
Encouraged him to use his CPAP as much as he can while asleep.  He is compliant and reports benefit.  He will check with his DME about when he is due for a new machine and new supplies.

## 2011-11-15 NOTE — Assessment & Plan Note (Signed)
He has been relatively stable over his past few visits.  He can follow up in one year to maintain relationship with pulmonary, but advised him to call if he needs help sooner.

## 2011-11-15 NOTE — Progress Notes (Signed)
Chief Complaint  Patient presents with  . COPD    Pt states his breathing has been fine. ocassional cough. denies any wheezing, chest tx  . Sleep Apnea    He states he wears his cpap machine everynight x 6-7 hrs a night. denies any problems with mask/machine    History of Present Illness:  CC: [name] is a 78 y.o. male with severe COPD and moderate OSA on CPAP 7 cm H2O.  He has been doing okay.  He had some trouble last month and had to increase his prednisone up to 10 mg daily.  He is now back to 5 mg daily.  He uses albuterol 2 to 3 times per day.  He goes to the [facility] on a regular basis.  He gets occasional cough with clear sputum.  He does not get wheezing much.  He is using CPAP and feels this helps.  He has trouble taking this with him when he travels, but he is not travelling as much as before.    Past Medical History  Diagnosis Date  . TIA (transient ischemic attack) 12/2006    Dr. Leonie Man  . OSA (obstructive sleep apnea)   . COPD (chronic obstructive pulmonary disease)   . Rhinitis   . Hypertension   . DM (diabetes mellitus)   . DJD (degenerative joint disease)   . Dyslipidemia   . Hiatal hernia   . Hemorrhoids   . Diverticulosis   . Overactive bladder   . Shingles   . Asthma   . Seasonal allergies   . GERD (gastroesophageal reflux disease)   . Anemia   . Bilateral sensorineural hearing loss   . Osteoarthritis   . Benign prostatic hypertrophy   . CAD (coronary artery disease) 07/2003    single vessel CAD post stent of RCA, by Dr. Acie Fredrickson  . Stroke     Past Surgical History  Procedure Date  . L5 laminectomy 2004    Dr. Sherwood Gambler  . Right total knee 2006    Dr. Maureen Ralphs  . Coronary angioplasty with stent placement 07/2003    Est. EF of 65% with stent to the RCA, by Dr. Acie Fredrickson  . Inner ear surgery     Current Outpatient Prescriptions on File Prior to Visit  Medication Sig Dispense Refill  . albuterol (PROVENTIL) (2.5 MG/3ML) 0.083%  nebulizer solution Take 2.5 mg by nebulization 2 (two) times daily.        Marland Kitchen allopurinol (ZYLOPRIM) 100 MG tablet Take 100 mg by mouth daily.        Marland Kitchen aspirin 81 MG tablet Take 81 mg by mouth daily.        Marland Kitchen atorvastatin (LIPITOR) 20 MG tablet Take 20 mg by mouth daily.        . budesonide-formoterol (SYMBICORT) 160-4.5 MCG/ACT inhaler Inhale 2 puffs into the lungs 2 (two) times daily.        . cetaphil (CETAPHIL) cream As needed for itching       . ciprofloxacin-dexamethasone (CIPRODEX) otic suspension As needed       . clopidogrel (PLAVIX) 75 MG tablet TAKE 1 TABLET DAILY (NEED APPOINTMENT FOR MORE REFILLS)  90 tablet  0  . doxycycline (VIBRAMYCIN) 100 MG capsule As needed for foot infection       . furosemide (LASIX) 20 MG tablet Take 20 mg by mouth daily.        Marland Kitchen HYDROcodone-acetaminophen (VICODIN) 5-500 MG per tablet As needed for pain       .  hydrOXYzine (ATARAX/VISTARIL) 25 MG tablet Take 25 mg by mouth as needed.        . iron polysaccharides (NIFEREX) 150 MG capsule Take 150 mg by mouth daily.       Marland Kitchen loratadine (CLARITIN) 10 MG tablet Take 10 mg by mouth daily.        . montelukast (SINGULAIR) 10 MG tablet Take 10 mg by mouth at bedtime.        . nitroGLYCERIN (NITROSTAT) 0.4 MG SL tablet Place 0.4 mg under the tongue every 5 (five) minutes as needed.        . pantoprazole (PROTONIX) 40 MG tablet Take 40 mg by mouth 2 (two) times daily.       . predniSONE (DELTASONE) 5 MG tablet 2.5 daily      . roflumilast (DALIRESP) 500 MCG TABS tablet Take 500 mcg by mouth daily.        . sitaGLIPtan-metformin (JANUMET) 50-500 MG per tablet Take 1 tablet by mouth 2 (two) times daily with a meal.        . Tamsulosin HCl (FLOMAX) 0.4 MG CAPS Take 0.4 mg by mouth daily after supper.        . telmisartan (MICARDIS) 80 MG tablet Take 80 mg by mouth daily.        Marland Kitchen tiotropium (SPIRIVA) 18 MCG inhalation capsule Place 18 mcg into inhaler and inhale daily.        . vitamin C (ASCORBIC ACID) 500 MG tablet  Take 500 mg by mouth daily.          Allergies  Allergen Reactions  . Sulfonamide Derivatives     Physical Exam:  Blood pressure 108/58, pulse 92, temperature 97.1 F (36.2 C), temperature source Oral, height 5\' 6"  (1.676 m), weight 146 lb 6.4 oz (66.407 kg), SpO2 97.00%.  General - Thin HEENT - no sinus tenderness, no oral exudate, no LAN Cardiac - s1s2 regular, no murmur Chest - prolonged exhalation, no wheeze/rales Abdomen - soft, nontender Extremities - no edema Neurologic - normal strength, CN intact Psychiatric - normal mood, behavior   Assessment/Plan:    Outpatient Encounter Prescriptions as of 11/15/2011  Medication Sig Dispense Refill  . albuterol (PROVENTIL) (2.5 MG/3ML) 0.083% nebulizer solution Take 2.5 mg by nebulization 2 (two) times daily.        Marland Kitchen allopurinol (ZYLOPRIM) 100 MG tablet Take 100 mg by mouth daily.        Marland Kitchen aspirin 81 MG tablet Take 81 mg by mouth daily.        Marland Kitchen atorvastatin (LIPITOR) 20 MG tablet Take 20 mg by mouth daily.        . budesonide-formoterol (SYMBICORT) 160-4.5 MCG/ACT inhaler Inhale 2 puffs into the lungs 2 (two) times daily.        . cetaphil (CETAPHIL) cream As needed for itching       . ciprofloxacin-dexamethasone (CIPRODEX) otic suspension As needed       . clopidogrel (PLAVIX) 75 MG tablet TAKE 1 TABLET DAILY (NEED APPOINTMENT FOR MORE REFILLS)  90 tablet  0  . diltiazem (DILTZAC) 120 MG 24 hr capsule Take 120 mg by mouth daily.      Marland Kitchen doxycycline (VIBRAMYCIN) 100 MG capsule As needed for foot infection       . furosemide (LASIX) 20 MG tablet Take 20 mg by mouth daily.        Marland Kitchen HYDROcodone-acetaminophen (VICODIN) 5-500 MG per tablet As needed for pain       . hydrOXYzine (  ATARAX/VISTARIL) 25 MG tablet Take 25 mg by mouth as needed.        . iron polysaccharides (NIFEREX) 150 MG capsule Take 150 mg by mouth daily.       Marland Kitchen loratadine (CLARITIN) 10 MG tablet Take 10 mg by mouth daily.        . montelukast (SINGULAIR) 10 MG  tablet Take 10 mg by mouth at bedtime.        . nitroGLYCERIN (NITROSTAT) 0.4 MG SL tablet Place 0.4 mg under the tongue every 5 (five) minutes as needed.        . pantoprazole (PROTONIX) 40 MG tablet Take 40 mg by mouth 2 (two) times daily.       . predniSONE (DELTASONE) 5 MG tablet 2.5 daily      . roflumilast (DALIRESP) 500 MCG TABS tablet Take 500 mcg by mouth daily.        . sitaGLIPtan-metformin (JANUMET) 50-500 MG per tablet Take 1 tablet by mouth 2 (two) times daily with a meal.        . Tamsulosin HCl (FLOMAX) 0.4 MG CAPS Take 0.4 mg by mouth daily after supper.        . telmisartan (MICARDIS) 80 MG tablet Take 80 mg by mouth daily.        Marland Kitchen tiotropium (SPIRIVA) 18 MCG inhalation capsule Place 18 mcg into inhaler and inhale daily.        . vitamin C (ASCORBIC ACID) 500 MG tablet Take 500 mg by mouth daily.        Marland Kitchen DISCONTD: diltiazem (TIAZAC) 360 MG 24 hr capsule Once a day      . DISCONTD: gabapentin (NEURONTIN) 300 MG capsule Take 300 mg by mouth 2 (two) times daily.        Marland Kitchen DISCONTD: Multiple Vitamins-Minerals (CENTRUM SILVER PO) Take 1 tablet by mouth daily.          Coraline Talwar 11/15/2011, 12:06 PM

## 2011-11-29 ENCOUNTER — Other Ambulatory Visit: Payer: Self-pay | Admitting: Cardiovascular Disease

## 2012-01-03 ENCOUNTER — Ambulatory Visit (INDEPENDENT_AMBULATORY_CARE_PROVIDER_SITE_OTHER): Payer: Medicare Other | Admitting: Cardiovascular Disease

## 2012-01-03 ENCOUNTER — Encounter: Payer: Self-pay | Admitting: Cardiovascular Disease

## 2012-01-03 VITALS — BP 132/60 | HR 89 | Ht 66.0 in | Wt 146.6 lb

## 2012-01-03 DIAGNOSIS — I2510 Atherosclerotic heart disease of native coronary artery without angina pectoris: Secondary | ICD-10-CM | POA: Insufficient documentation

## 2012-01-03 DIAGNOSIS — I25810 Atherosclerosis of coronary artery bypass graft(s) without angina pectoris: Secondary | ICD-10-CM

## 2012-01-03 DIAGNOSIS — E785 Hyperlipidemia, unspecified: Secondary | ICD-10-CM

## 2012-01-03 LAB — LIPID PANEL
Cholesterol: 109 mg/dL (ref 0–200)
LDL Cholesterol: 40 mg/dL (ref 0–99)
Total CHOL/HDL Ratio: 2
VLDL: 8.8 mg/dL (ref 0.0–40.0)

## 2012-01-03 LAB — BASIC METABOLIC PANEL
BUN: 22 mg/dL (ref 6–23)
CO2: 27 mEq/L (ref 19–32)
Chloride: 102 mEq/L (ref 96–112)
Creatinine, Ser: 1.3 mg/dL (ref 0.4–1.5)
Glucose, Bld: 79 mg/dL (ref 70–99)
Potassium: 4.9 mEq/L (ref 3.5–5.1)

## 2012-01-03 LAB — HEPATIC FUNCTION PANEL
Alkaline Phosphatase: 80 U/L (ref 39–117)
Bilirubin, Direct: 0.1 mg/dL (ref 0.0–0.3)
Total Protein: 6.3 g/dL (ref 6.0–8.3)

## 2012-01-03 MED ORDER — DILTIAZEM HCL ER BEADS 180 MG PO CP24: 180.0000 mg | ORAL_CAPSULE | Freq: Every day | ORAL | Status: DC

## 2012-01-03 NOTE — Progress Notes (Signed)
[name] Date of Birth  Mar 03, 1929       [facility]    [employer] [address], [place], [place]  [address]   [name], Robert  [phone]     [phone]   Fax  [phone]    Fax [phone]  Problem List: 1. CAD - s/p PCI  2005, mid RCA 2. COPD 3. Hyperlipidemia 4. Diabetes Mellitus    History of Present Illness:  [name] is doing well.  He has not had any cardiac problems.  He is exercising regularly without angina or shortness of breath.  Current Outpatient Prescriptions on File Prior to Visit  Medication Sig Dispense Refill  . albuterol (PROVENTIL) (2.5 MG/3ML) 0.083% nebulizer solution Take 0.83 mg by nebulization every 4 (four) hours as needed.       Marland Kitchen allopurinol (ZYLOPRIM) 100 MG tablet Take 100 mg by mouth daily.        Marland Kitchen aspirin 81 MG tablet Take 81 mg by mouth daily.        Marland Kitchen atorvastatin (LIPITOR) 20 MG tablet Take 20 mg by mouth daily.        . budesonide-formoterol (SYMBICORT) 160-4.5 MCG/ACT inhaler Inhale 2 puffs into the lungs 2 (two) times daily.        . cetaphil (CETAPHIL) cream As needed for itching       . ciprofloxacin-dexamethasone (CIPRODEX) otic suspension As needed       . clopidogrel (PLAVIX) 75 MG tablet TAKE 1 TABLET DAILY (NEED APPOINTMENT FOR MORE REFILLS)  90 tablet  0  . diltiazem (DILTZAC) 120 MG 24 hr capsule Take 120 mg by mouth daily.      Marland Kitchen doxycycline (VIBRAMYCIN) 100 MG capsule As needed for foot infection       . furosemide (LASIX) 20 MG tablet Take 20 mg by mouth daily.        Marland Kitchen HYDROcodone-acetaminophen (VICODIN) 5-500 MG per tablet As needed for pain       . hydrOXYzine (ATARAX/VISTARIL) 25 MG tablet Take 25 mg by mouth as needed.        . iron polysaccharides (NIFEREX) 150 MG capsule Take 150 mg by mouth daily.       Marland Kitchen loratadine (CLARITIN) 10 MG tablet Take 10 mg by mouth daily.        . montelukast (SINGULAIR) 10 MG tablet Take 10 mg by mouth at  bedtime.        . nitroGLYCERIN (NITROSTAT) 0.4 MG SL tablet Place 0.4 mg under the tongue every 5 (five) minutes as needed.        . pantoprazole (PROTONIX) 40 MG tablet Take 40 mg by mouth 2 (two) times daily.       . predniSONE (DELTASONE) 5 MG tablet 2.5 daily      . roflumilast (DALIRESP) 500 MCG TABS tablet Take 500 mcg by mouth daily.        . sitaGLIPtan-metformin (JANUMET) 50-500 MG per tablet Take 1 tablet by mouth 2 (two) times daily with a meal.        . solifenacin (VESICARE) 5 MG tablet Take 10 mg by mouth daily.      . Tamsulosin HCl (FLOMAX) 0.4 MG CAPS Take 0.4 mg by mouth daily after supper.        . telmisartan (MICARDIS) 80 MG tablet Take 80 mg by mouth daily.        Marland Kitchen tiotropium (SPIRIVA) 18 MCG  inhalation capsule Place 18 mcg into inhaler and inhale daily.        . vitamin C (ASCORBIC ACID) 500 MG tablet Take 500 mg by mouth daily.          Allergies  Allergen Reactions  . Sulfonamide Derivatives     Past Medical History  Diagnosis Date  . TIA (transient ischemic attack) 12/2006    Dr. Leonie Man  . OSA (obstructive sleep apnea)   . COPD (chronic obstructive pulmonary disease)   . Rhinitis   . Hypertension   . DM (diabetes mellitus)   . DJD (degenerative joint disease)   . Dyslipidemia   . Hiatal hernia   . Hemorrhoids   . Diverticulosis   . Overactive bladder   . Shingles   . Asthma   . Seasonal allergies   . GERD (gastroesophageal reflux disease)   . Anemia   . Bilateral sensorineural hearing loss   . Osteoarthritis   . Benign prostatic hypertrophy   . CAD (coronary artery disease) 07/2003    single vessel CAD post stent of RCA, by Dr. Acie Fredrickson  . Stroke     Past Surgical History  Procedure Date  . L5 laminectomy 2004    Dr. Sherwood Gambler  . Right total knee 2006    Dr. Maureen Ralphs  . Coronary angioplasty with stent placement 07/2003    Est. EF of 65% with stent to the RCA, by Dr. Acie Fredrickson  . Inner ear surgery     History  Smoking status  . Former Smoker  -- 1.0 packs/day for 43 years  . Types: Cigarettes  . Quit date: 05/02/1988  Smokeless tobacco  . Not on file    History  Alcohol Use: Not on file    Family History  Problem Relation Age of Onset  . Lung disease Father   . Lung disease Brother   . Asthma Father   . Asthma Brother   . Sudden death Mother     unknown causes  . Sudden death Brother 27    unknown causes  . Sudden death Sister     unknown causes  . Sudden death Sister     unknown causes    Reviw of Systems:  Reviewed in the HPI.  All other systems are negative.  Physical Exam: Blood pressure 132/60, pulse 89, height 5\' 6"  (1.676 m), weight 146 lb 9.6 oz (66.497 kg). General: Well developed, well nourished, in no acute distress.  Head: Normocephalic, atraumatic, sclera non-icteric, mucus membranes are moist,   Neck: Supple. Carotids are 2 + without bruits. No JVD  Lungs: Clear bilaterally to auscultation.  Heart: regular rate.  normal  S1 S2. No murmurs, gallops or rubs.  Abdomen: Soft, non-tender, non-distended with normal bowel sounds. No hepatomegaly. No rebound/guarding. No masses.  Msk:  Strength and tone are normal  Extremities: No clubbing or cyanosis. No edema.  Distal pedal pulses are 2+ and equal bilaterally.  Neuro: Alert and oriented X 3. Moves all extremities spontaneously.  Psych:  Responds to questions appropriately with a normal affect.  ECG: Sept. 3, 2013 - NSR, No ST or T wave changes.  He has small inferior Q waves.  Assessment / Plan:

## 2012-01-03 NOTE — Patient Instructions (Addendum)
Your physician wants you to follow-up in: 1 year You will receive a reminder letter in the mail two months in advance. If you don't receive a letter, please call our office to schedule the follow-up appointment.  Your physician has recommended you make the following change in your medication:   INCREASE DILTIAZEM TO 180 MG DAILY/YOU NEED TO CALL us IN 1 WEEK WITH BP AND PULSE RATE [phone] ASK FOR JODETTE RN  Your physician recommends that you return for a FASTING lipid profile: TODAY

## 2012-01-03 NOTE — Assessment & Plan Note (Addendum)
[name]  is doing well had any episodes of chest pain. We'll increase his diltiazem to 180 mg a day for his mild tachycardia.  He has significant asthma and I do not think he will tolerate beta blockers.    Will check fasting lipids today and again in 1 year.

## 2012-01-30 ENCOUNTER — Telehealth: Payer: Self-pay | Admitting: *Deleted

## 2012-01-30 NOTE — Telephone Encounter (Signed)
Asked pt to call back with bp/p readings, pt in shower and wife will have him call back to give readings.

## 2012-02-09 NOTE — Telephone Encounter (Signed)
Pt asked to call back with readings x 2, no return call.

## 2012-02-27 ENCOUNTER — Other Ambulatory Visit: Payer: Self-pay | Admitting: Cardiovascular Disease

## 2012-07-25 ENCOUNTER — Encounter: Payer: Self-pay | Admitting: Cardiovascular Disease

## 2012-11-13 ENCOUNTER — Ambulatory Visit (INDEPENDENT_AMBULATORY_CARE_PROVIDER_SITE_OTHER): Payer: Medicare PPO | Admitting: Pulmonary Disease

## 2012-11-13 ENCOUNTER — Encounter: Payer: Self-pay | Admitting: Pulmonary Disease

## 2012-11-13 ENCOUNTER — Telehealth: Payer: Self-pay | Admitting: Pulmonary Disease

## 2012-11-13 VITALS — BP 120/50 | HR 80 | Temp 98.4°F | Ht 66.0 in | Wt 140.6 lb

## 2012-11-13 DIAGNOSIS — J449 Chronic obstructive pulmonary disease, unspecified: Secondary | ICD-10-CM

## 2012-11-13 DIAGNOSIS — G4733 Obstructive sleep apnea (adult) (pediatric): Secondary | ICD-10-CM

## 2012-11-13 DIAGNOSIS — J4489 Other specified chronic obstructive pulmonary disease: Secondary | ICD-10-CM

## 2012-11-13 NOTE — Telephone Encounter (Signed)
appt set for today at 1:30. Hugo Bing, CMA

## 2012-11-13 NOTE — Telephone Encounter (Signed)
Please see if he can come this afternoon.  Okay to double book visit.

## 2012-11-13 NOTE — Assessment & Plan Note (Signed)
Advised him to monitor his peak flow at different times of the day.  Explained how the absolute number is not as important as the trend, and how his values can fluctuate depending on time of day.  Will continue his current regimen.  Discussed repeating chest xray and PFT >> he would like to defer these tests for now.    I do not feel he needs antibiotics at this time.  He can continue additional dose of prednisone as needed, and advised he could try pretreatment with albuterol before exercise.

## 2012-11-13 NOTE — Telephone Encounter (Signed)
i spoke with pt. He c/o increase SOB, cough in the AM w/ occasional clear phlem, at times he has chest tx. Per pt on peak flow he is getting about 200. He takes pred 5 mg daily. Pt has appt scheduled for 11/15/12 but wants to see VS only and would like to be worked in sooner. Please advise Dr. Halford Chessman thanks  Allergies  Allergen Reactions  . Sulfonamide Derivatives

## 2012-11-13 NOTE — Patient Instructions (Signed)
Will arrange for new CPAP mask Check your peak flow at different times of the day Call if symptoms get worse Follow up in 1 year

## 2012-11-13 NOTE — Assessment & Plan Note (Signed)
Will have him fitted with ResMed airfit mask.  Encouraged him to try using CPAP more, and explained how this could also provide secondary benefit for his COPD also.

## 2012-11-13 NOTE — Progress Notes (Signed)
Chief Complaint  Patient presents with  . Acute Visit    pt reports having SOB,diff breathing worsening this AM- also c/o occasional cough with chest tightness and wheezing    History of Present Illness:  CC: [name] is a 79 y.o. male with severe COPD and moderate OSA on CPAP 7 cm H2O.  He has been getting intermittent symptoms of wheeze and cough with clear sputum.  This sometimes happens when he exercises.  He still is able to talk on treadmill for 45 minutes at 3 miles per hour.  He checked his peak flow, and it was only 200.  About 1 or 2 years ago it used to be 300.  He denies fever, sinus congestion, or sore throat.  He remains on prednisone, and does take extra dose as needed.  He has not been using his CPAP consistently.  He has trouble with mask fit.  PSG 07/2006 AHI 23 He  has a past medical history of TIA (transient ischemic attack) (12/2006); OSA (obstructive sleep apnea); COPD (chronic obstructive pulmonary disease); Rhinitis; Hypertension; DM (diabetes mellitus); DJD (degenerative joint disease); Dyslipidemia; Hiatal hernia; Hemorrhoids; Diverticulosis; Overactive bladder; Shingles; Asthma; Seasonal allergies; GERD (gastroesophageal reflux disease); Anemia; Bilateral sensorineural hearing loss; Osteoarthritis; Benign prostatic hypertrophy; CAD (coronary artery disease) (07/2003); and Stroke.  He  has past surgical history that includes L5 laminectomy (2004); right total knee (2006); Coronary angioplasty with stent (07/2003); and Inner ear surgery.   Current Outpatient Prescriptions on File Prior to Visit  Medication Sig Dispense Refill  . albuterol (PROVENTIL) (2.5 MG/3ML) 0.083% nebulizer solution Take 0.83 mg by nebulization every 4 (four) hours as needed.       Marland Kitchen allopurinol (ZYLOPRIM) 100 MG tablet Take 100 mg by mouth daily.        Marland Kitchen aspirin 81 MG tablet Take 81 mg by mouth daily.        Marland Kitchen atorvastatin (LIPITOR) 20 MG tablet Take 20 mg by mouth  daily.        . budesonide-formoterol (SYMBICORT) 160-4.5 MCG/ACT inhaler Inhale 2 puffs into the lungs 2 (two) times daily.        . cetaphil (CETAPHIL) cream As needed for itching       . clopidogrel (PLAVIX) 75 MG tablet TAKE 1 TABLET DAILY (NEED APPOINTMENT FOR MORE REFILLS)  90 tablet  1  . diltiazem (DILTZAC) 180 MG 24 hr capsule Take 1 capsule (180 mg total) by mouth daily.  30 capsule  11  . furosemide (LASIX) 20 MG tablet Take 20 mg by mouth daily.        Marland Kitchen HYDROcodone-acetaminophen (VICODIN) 5-500 MG per tablet As needed for pain       . hydrOXYzine (ATARAX/VISTARIL) 25 MG tablet Take 25 mg by mouth as needed.        . iron polysaccharides (NIFEREX) 150 MG capsule Take 150 mg by mouth daily.       Marland Kitchen loratadine (CLARITIN) 10 MG tablet Take 10 mg by mouth daily.        . montelukast (SINGULAIR) 10 MG tablet Take 10 mg by mouth at bedtime.        . nitroGLYCERIN (NITROSTAT) 0.4 MG SL tablet Place 0.4 mg under the tongue every 5 (five) minutes as needed.        . pantoprazole (PROTONIX) 40 MG tablet Take 40 mg by mouth 2 (two) times daily.       . predniSONE (DELTASONE) 5 MG tablet 2.5 daily      .  roflumilast (DALIRESP) 500 MCG TABS tablet Take 500 mcg by mouth daily.        . sitaGLIPtan-metformin (JANUMET) 50-500 MG per tablet Take 1 tablet by mouth 2 (two) times daily with a meal.        . solifenacin (VESICARE) 5 MG tablet Take 10 mg by mouth daily.      . Tamsulosin HCl (FLOMAX) 0.4 MG CAPS Take 0.4 mg by mouth daily after supper.        . telmisartan (MICARDIS) 80 MG tablet Take 80 mg by mouth daily.        Marland Kitchen tiotropium (SPIRIVA) 18 MCG inhalation capsule Place 18 mcg into inhaler and inhale daily.         No current facility-administered medications on file prior to visit.    Allergies  Allergen Reactions  . Sulfonamide Derivatives     Physical Exam:  General - Thin HEENT - no sinus tenderness, no oral exudate, no LAN Cardiac - s1s2 regular, no murmur Chest - prolonged  exhalation, no wheeze/rales Abdomen - soft, nontender Extremities - no edema Neurologic - normal strength, CN intact Psychiatric - normal mood, behavior   Assessment/Plan:  [name], MD [place] 11/13/2012, 1:39 PM Pager:  [phone] After 3pm call: [phone]

## 2012-11-14 ENCOUNTER — Telehealth: Payer: Self-pay | Admitting: Pulmonary Disease

## 2012-11-14 NOTE — Telephone Encounter (Signed)
ATC patient x3 at home with no answer no option to leave VM, spoke with receptionist at work # she states no one is employed there by that name Need to ensure he is not coming in tomorrow 11/15/12 w VS Patient just seen 7/15 for acute visit and instructed to follow in 1 year. Will cancel appt and await call back from patient

## 2012-11-15 ENCOUNTER — Ambulatory Visit: Payer: Medicare Other | Admitting: Pulmonary Disease

## 2012-11-23 NOTE — Telephone Encounter (Signed)
Returning call can be reached at [phone].[name]

## 2012-11-23 NOTE — Telephone Encounter (Signed)
Patient returning call.

## 2012-11-23 NOTE — Telephone Encounter (Signed)
ATC pt NA NO VM WCB

## 2012-11-23 NOTE — Telephone Encounter (Signed)
I spoke with spouse. This is old message. Will sign off.

## 2013-01-04 ENCOUNTER — Observation Stay (HOSPITAL_COMMUNITY): Payer: Medicare PPO

## 2013-01-04 ENCOUNTER — Observation Stay (HOSPITAL_COMMUNITY)
Admission: AD | Admit: 2013-01-04 | Discharge: 2013-01-05 | Disposition: A | Discharge status: Home / Self Care | Payer: Medicare PPO | Source: Ambulatory Visit | Attending: Internal Medicine | Admitting: Internal Medicine

## 2013-01-04 ENCOUNTER — Encounter (HOSPITAL_COMMUNITY): Payer: Self-pay | Admitting: Internal Medicine

## 2013-01-04 DIAGNOSIS — E1142 Type 2 diabetes mellitus with diabetic polyneuropathy: Secondary | ICD-10-CM | POA: Insufficient documentation

## 2013-01-04 DIAGNOSIS — J441 Chronic obstructive pulmonary disease with (acute) exacerbation: Secondary | ICD-10-CM | POA: Insufficient documentation

## 2013-01-04 DIAGNOSIS — R0609 Other forms of dyspnea: Secondary | ICD-10-CM | POA: Insufficient documentation

## 2013-01-04 DIAGNOSIS — I2510 Atherosclerotic heart disease of native coronary artery without angina pectoris: Secondary | ICD-10-CM | POA: Diagnosis present

## 2013-01-04 DIAGNOSIS — I10 Essential (primary) hypertension: Secondary | ICD-10-CM | POA: Insufficient documentation

## 2013-01-04 DIAGNOSIS — E1149 Type 2 diabetes mellitus with other diabetic neurological complication: Secondary | ICD-10-CM

## 2013-01-04 DIAGNOSIS — G4733 Obstructive sleep apnea (adult) (pediatric): Secondary | ICD-10-CM | POA: Diagnosis present

## 2013-01-04 DIAGNOSIS — R079 Chest pain, unspecified: Principal | ICD-10-CM

## 2013-01-04 DIAGNOSIS — J449 Chronic obstructive pulmonary disease, unspecified: Secondary | ICD-10-CM | POA: Diagnosis present

## 2013-01-04 DIAGNOSIS — Z79899 Other long term (current) drug therapy: Secondary | ICD-10-CM | POA: Insufficient documentation

## 2013-01-04 DIAGNOSIS — R0600 Dyspnea, unspecified: Secondary | ICD-10-CM

## 2013-01-04 DIAGNOSIS — R0989 Other specified symptoms and signs involving the circulatory and respiratory systems: Secondary | ICD-10-CM | POA: Insufficient documentation

## 2013-01-04 LAB — GLUCOSE, CAPILLARY
Glucose-Capillary: 162 mg/dL — ABNORMAL HIGH (ref 70–99)
Glucose-Capillary: 243 mg/dL — ABNORMAL HIGH (ref 70–99)

## 2013-01-04 LAB — CBC
MCH: 29.6 pg (ref 26.0–34.0)
MCV: 85.2 fL (ref 78.0–100.0)
Platelets: 233 10*3/uL (ref 150–400)
RBC: 4.87 MIL/uL (ref 4.22–5.81)
RDW: 14.1 % (ref 11.5–15.5)
WBC: 12.1 10*3/uL — ABNORMAL HIGH (ref 4.0–10.5)

## 2013-01-04 LAB — COMPREHENSIVE METABOLIC PANEL
AST: 13 U/L (ref 0–37)
Albumin: 3.8 g/dL (ref 3.5–5.2)
Alkaline Phosphatase: 47 U/L (ref 39–117)
BUN: 15 mg/dL (ref 6–23)
CO2: 23 mEq/L (ref 19–32)
Chloride: 102 mEq/L (ref 96–112)
Creatinine, Ser: 0.94 mg/dL (ref 0.50–1.35)
GFR calc non Af Amer: 76 mL/min — ABNORMAL LOW (ref >90)
Potassium: 4.2 mEq/L (ref 3.5–5.1)
Total Bilirubin: 0.4 mg/dL (ref 0.3–1.2)

## 2013-01-04 LAB — TROPONIN I: Troponin I: <0.3 ng/mL (ref <0.30)

## 2013-01-04 MED ORDER — DILTIAZEM HCL ER BEADS 120 MG PO CP24: 180.0000 mg | ORAL_CAPSULE | Freq: Every day | ORAL | Status: DC

## 2013-01-04 MED ORDER — HEPARIN (PORCINE) IN NACL 100-0.45 UNIT/ML-% IJ SOLN
800.0000 [IU]/h | INTRAMUSCULAR | Status: DC
  Administered 2013-01-04: 800 [IU]/h via INTRAVENOUS
  Filled 2013-01-04: qty 250

## 2013-01-04 MED ORDER — PANTOPRAZOLE SODIUM 40 MG PO TBEC
40.0000 mg | DELAYED_RELEASE_TABLET | Freq: Two times a day (BID) | ORAL | Status: DC
  Administered 2013-01-04 – 2013-01-05 (×2): 40 mg via ORAL
  Filled 2013-01-04 (×2): qty 1

## 2013-01-04 MED ORDER — ROFLUMILAST 500 MCG PO TABS
500.0000 ug | ORAL_TABLET | Freq: Every day | ORAL | Status: DC
  Administered 2013-01-04 – 2013-01-05 (×2): 500 ug via ORAL
  Filled 2013-01-04 (×4): qty 1

## 2013-01-04 MED ORDER — ASPIRIN EC 81 MG PO TBEC
81.0000 mg | DELAYED_RELEASE_TABLET | Freq: Every day | ORAL | Status: DC
  Administered 2013-01-04 – 2013-01-05 (×2): 81 mg via ORAL
  Filled 2013-01-04 (×2): qty 1

## 2013-01-04 MED ORDER — LORATADINE 10 MG PO TABS
10.0000 mg | ORAL_TABLET | Freq: Every day | ORAL | Status: DC
  Administered 2013-01-05: 10 mg via ORAL
  Filled 2013-01-04: qty 1

## 2013-01-04 MED ORDER — FUROSEMIDE 20 MG PO TABS
20.0000 mg | ORAL_TABLET | Freq: Every day | ORAL | Status: DC
  Administered 2013-01-05: 20 mg via ORAL
  Filled 2013-01-04: qty 1

## 2013-01-04 MED ORDER — POLYSACCHARIDE IRON COMPLEX 150 MG PO CAPS
150.0000 mg | ORAL_CAPSULE | Freq: Every day | ORAL | Status: DC
  Administered 2013-01-04 – 2013-01-05 (×2): 150 mg via ORAL
  Filled 2013-01-04 (×2): qty 1

## 2013-01-04 MED ORDER — ASPIRIN 81 MG PO TABS
81.0000 mg | ORAL_TABLET | Freq: Every day | ORAL | Status: DC
Start: 2013-01-04 — End: 2013-01-04

## 2013-01-04 MED ORDER — ACETAMINOPHEN 650 MG RE SUPP: 650.0000 mg | Freq: Four times a day (QID) | RECTAL | Status: DC | PRN

## 2013-01-04 MED ORDER — SODIUM CHLORIDE 0.9 % IJ SOLN
3.0000 mL | Freq: Two times a day (BID) | INTRAMUSCULAR | Status: DC
  Administered 2013-01-04: 3 mL via INTRAVENOUS

## 2013-01-04 MED ORDER — MORPHINE SULFATE 2 MG/ML IJ SOLN: 2.0000 mg | INTRAMUSCULAR | Status: DC | PRN

## 2013-01-04 MED ORDER — TAMSULOSIN HCL 0.4 MG PO CAPS
0.4000 mg | ORAL_CAPSULE | Freq: Every day | ORAL | Status: DC
  Administered 2013-01-04: 0.4 mg via ORAL
  Filled 2013-01-04 (×2): qty 1

## 2013-01-04 MED ORDER — INSULIN ASPART 100 UNIT/ML ~~LOC~~ SOLN
0.0000 [IU] | Freq: Every day | SUBCUTANEOUS | Status: DC
  Administered 2013-01-04: 2 [IU] via SUBCUTANEOUS

## 2013-01-04 MED ORDER — HEPARIN BOLUS VIA INFUSION
3900.0000 [IU] | Freq: Once | INTRAVENOUS | Status: AC
  Administered 2013-01-04: 3900 [IU] via INTRAVENOUS
  Filled 2013-01-04: qty 3900

## 2013-01-04 MED ORDER — CLOPIDOGREL BISULFATE 75 MG PO TABS
75.0000 mg | ORAL_TABLET | Freq: Every day | ORAL | Status: DC
  Administered 2013-01-05: 75 mg via ORAL
  Filled 2013-01-04: qty 1

## 2013-01-04 MED ORDER — INSULIN ASPART 100 UNIT/ML ~~LOC~~ SOLN
0.0000 [IU] | Freq: Three times a day (TID) | SUBCUTANEOUS | Status: DC
  Administered 2013-01-04: 3 [IU] via SUBCUTANEOUS
  Administered 2013-01-05: 11 [IU] via SUBCUTANEOUS

## 2013-01-04 MED ORDER — POTASSIUM CHLORIDE IN NACL 20-0.9 MEQ/L-% IV SOLN
INTRAVENOUS | Status: DC
  Administered 2013-01-04: 20 mL/h via INTRAVENOUS
  Filled 2013-01-04: qty 1000

## 2013-01-04 MED ORDER — ACETAMINOPHEN 325 MG PO TABS
650.0000 mg | ORAL_TABLET | Freq: Four times a day (QID) | ORAL | Status: DC | PRN
  Administered 2013-01-04: 325 mg via ORAL
  Filled 2013-01-04: qty 2

## 2013-01-04 MED ORDER — MONTELUKAST SODIUM 10 MG PO TABS
10.0000 mg | ORAL_TABLET | Freq: Every day | ORAL | Status: DC
  Filled 2013-01-04: qty 1

## 2013-01-04 MED ORDER — ATORVASTATIN CALCIUM 20 MG PO TABS
20.0000 mg | ORAL_TABLET | Freq: Every day | ORAL | Status: DC
  Administered 2013-01-05: 20 mg via ORAL
  Filled 2013-01-04: qty 1

## 2013-01-04 MED ORDER — NITROGLYCERIN 0.4 MG SL SUBL: 0.4000 mg | SUBLINGUAL_TABLET | SUBLINGUAL | Status: DC | PRN

## 2013-01-04 MED ORDER — HYDROXYZINE HCL 25 MG PO TABS: 25.0000 mg | ORAL_TABLET | ORAL | Status: DC | PRN

## 2013-01-04 MED ORDER — IRBESARTAN 75 MG PO TABS
75.0000 mg | ORAL_TABLET | Freq: Every day | ORAL | Status: DC
  Administered 2013-01-05: 75 mg via ORAL
  Filled 2013-01-04: qty 1

## 2013-01-04 MED ORDER — HYDROCODONE-ACETAMINOPHEN 5-325 MG PO TABS: 1.0000 | ORAL_TABLET | ORAL | Status: DC | PRN

## 2013-01-04 MED ORDER — OXYCODONE HCL 5 MG PO TABS: 5.0000 mg | ORAL_TABLET | ORAL | Status: DC | PRN

## 2013-01-04 MED ORDER — METHYLPREDNISOLONE SODIUM SUCC 40 MG IJ SOLR
40.0000 mg | Freq: Four times a day (QID) | INTRAMUSCULAR | Status: DC
  Administered 2013-01-04 – 2013-01-05 (×4): 40 mg via INTRAVENOUS
  Filled 2013-01-04 (×8): qty 1

## 2013-01-04 MED ORDER — DARIFENACIN HYDROBROMIDE ER 7.5 MG PO TB24
7.5000 mg | ORAL_TABLET | Freq: Every day | ORAL | Status: DC
  Administered 2013-01-05: 7.5 mg via ORAL
  Filled 2013-01-04: qty 1

## 2013-01-04 MED ORDER — BUDESONIDE-FORMOTEROL FUMARATE 160-4.5 MCG/ACT IN AERO
2.0000 | INHALATION_SPRAY | Freq: Two times a day (BID) | RESPIRATORY_TRACT | Status: DC
  Administered 2013-01-04 – 2013-01-05 (×2): 2 via RESPIRATORY_TRACT
  Filled 2013-01-04: qty 6

## 2013-01-04 MED ORDER — ALBUTEROL SULFATE (5 MG/ML) 0.5% IN NEBU: 2.5000 mg | INHALATION_SOLUTION | RESPIRATORY_TRACT | Status: DC | PRN

## 2013-01-04 MED ORDER — ALLOPURINOL 100 MG PO TABS
100.0000 mg | ORAL_TABLET | Freq: Every day | ORAL | Status: DC
  Administered 2013-01-04 – 2013-01-05 (×2): 100 mg via ORAL
  Filled 2013-01-04 (×2): qty 1

## 2013-01-04 MED ORDER — DILTIAZEM HCL ER COATED BEADS 180 MG PO CP24
180.0000 mg | ORAL_CAPSULE | Freq: Every day | ORAL | Status: DC
  Administered 2013-01-05: 180 mg via ORAL
  Filled 2013-01-04: qty 1

## 2013-01-04 MED ORDER — TIOTROPIUM BROMIDE MONOHYDRATE 18 MCG IN CAPS
18.0000 ug | ORAL_CAPSULE | Freq: Every day | RESPIRATORY_TRACT | Status: DC
  Administered 2013-01-05: 18 ug via RESPIRATORY_TRACT
  Filled 2013-01-04: qty 5

## 2013-01-04 NOTE — Progress Notes (Signed)
ANTICOAGULATION CONSULT NOTE - Initial Consult  Pharmacy Consult for heparin Indication: chest pain/ACS  Allergies  Allergen Reactions  . Ciprofloxacin Hcl Other (See Comments)    Unknown   . Sulfonamide Derivatives     Unknown     Patient Measurements: Height: 5\' 5"  (165.1 cm) Weight: 143 lb (64.864 kg) IBW/kg (Calculated) : 61.5 Heparin Dosing Weight: 64.9kg  Vital Signs: Temp: 98.2 F (36.8 C) (09/05 1320) Temp src: Oral (09/05 1320) BP: 153/74 mmHg (09/05 1320) Pulse Rate: 85 (09/05 1320)  Labs: No results found for this basename: HGB, HCT, PLT, APTT, LABPROT, INR, HEPARINUNFRC, CREATININE, CKTOTAL, CKMB, TROPONINI,  in the last 72 hours  Estimated Creatinine Clearance: 38.8 ml/min (by C-G formula based on Cr of 1.3).   Medical History: Past Medical History  Diagnosis Date  . TIA (transient ischemic attack) 12/2006    Dr. Leonie Man  . OSA (obstructive sleep apnea)   . COPD (chronic obstructive pulmonary disease)   . Rhinitis   . Hypertension   . DM (diabetes mellitus)   . DJD (degenerative joint disease)   . Dyslipidemia   . Hiatal hernia   . Hemorrhoids   . Diverticulosis   . Overactive bladder   . Shingles   . Asthma   . Seasonal allergies   . GERD (gastroesophageal reflux disease)   . Anemia   . Bilateral sensorineural hearing loss   . Osteoarthritis   . Benign prostatic hypertrophy   . CAD (coronary artery disease) 07/2003    single vessel CAD post stent of RCA, by Dr. Acie Fredrickson  . Stroke     Medications:  Prescriptions prior to admission  Medication Sig Dispense Refill  . albuterol (PROVENTIL) (2.5 MG/3ML) 0.083% nebulizer solution Take 0.83 mg by nebulization every 4 (four) hours as needed.       Marland Kitchen allopurinol (ZYLOPRIM) 100 MG tablet Take 100 mg by mouth daily.       Marland Kitchen aspirin 81 MG tablet Take 81 mg by mouth daily.       Marland Kitchen atorvastatin (LIPITOR) 20 MG tablet Take 20 mg by mouth daily.        . budesonide-formoterol (SYMBICORT) 160-4.5 MCG/ACT  inhaler Inhale 2 puffs into the lungs 2 (two) times daily.        . clopidogrel (PLAVIX) 75 MG tablet TAKE 1 TABLET DAILY (NEED APPOINTMENT FOR MORE REFILLS)  90 tablet  1  . diltiazem (DILTZAC) 180 MG 24 hr capsule Take 1 capsule (180 mg total) by mouth daily.  30 capsule  11  . furosemide (LASIX) 20 MG tablet Take 20 mg by mouth daily.        . iron polysaccharides (NIFEREX) 150 MG capsule Take 150 mg by mouth daily.       Marland Kitchen loratadine (CLARITIN) 10 MG tablet Take 10 mg by mouth daily.        . montelukast (SINGULAIR) 10 MG tablet Take 10 mg by mouth at bedtime.        . pantoprazole (PROTONIX) 40 MG tablet Take 40 mg by mouth 2 (two) times daily.       . predniSONE (DELTASONE) 5 MG tablet 2.5 daily      . roflumilast (DALIRESP) 500 MCG TABS tablet Take 500 mcg by mouth daily.        . sitaGLIPtan-metformin (JANUMET) 50-500 MG per tablet Take 1 tablet by mouth 2 (two) times daily with a meal.        . solifenacin (VESICARE) 5 MG tablet Take 10  mg by mouth daily.      . Tamsulosin HCl (FLOMAX) 0.4 MG CAPS Take 0.4 mg by mouth daily after supper.        . telmisartan (MICARDIS) 80 MG tablet Take 80 mg by mouth daily.        Marland Kitchen tiotropium (SPIRIVA) 18 MCG inhalation capsule Place 18 mcg into inhaler and inhale daily.        . cetaphil (CETAPHIL) cream Apply 1 application topically as needed (itching). As needed for itching      . HYDROcodone-acetaminophen (VICODIN) 5-500 MG per tablet As needed for pain       . hydrOXYzine (ATARAX/VISTARIL) 25 MG tablet Take 25 mg by mouth as needed for itching or anxiety.       . nitroGLYCERIN (NITROSTAT) 0.4 MG SL tablet Place 0.4 mg under the tongue every 5 (five) minutes as needed.          Assessment: 83 yom to start IV heparin for ACS/CP. Baseline labs are pending. He is not on any anticoagulation PTA.   Goal of Therapy:  Heparin level 0.3-0.7 units/ml Monitor platelets by anticoagulation protocol: Yes   Plan:  1. Heparin bolus 3900 units/hr 2.  Heparin gtt 800 units/hr 3. Check an 8 hour heparin level 4. Daily heparin level and CBC 5. F/u baseline labs when available  Ralpheal Zappone, Rande Lawman 01/04/2013,3:22 PM

## 2013-01-04 NOTE — H&P (Signed)
[name] is an 79 y.o. male.   Chief Complaint: chest pain and difficulty breathing HPI:  Patient is an 79 year old man who is well known to me as a service his primary care physician.  He has a long-standing history of moderately severe emphysema that requires low dose prednisone as well as multiple inhaler medications.  Over the past 2 weeks she's had gradually increasing wheezing and dyspnea on exertion, now present with walking at a faster pace.  Last night while in bed he awoke with substernal chest discomfort that was mild and gradually improved while he was sleeping.  Today he still has mild left-sided chest discomfort that is not clearly pleuritic.  He has not had productive cough, fever, chills, or leg edema.  He has a history of coronary artery disease that required right coronary artery stent procedure.  He presented in the past with dyspnea as his angina equivalent.  He has no history of DVT or pulmonary embolism.  His medical history is otherwise significant for transient ischemic attack, obstructive sleep apnea, hypertension,hyperlipidemia, and diabetes mellitus, type II. Workup in our office included a 12-lead EKG that did not show acute findings and a chest x-ray that again showed no evidence of acute cardiopulmonary disease.  He had a peak flow test in the office that measured 130, so he was given Depo-Medrol 120 mg IM and admitted to the hospital for further evaluation, specifically to rule out non-ST elevation myocardial infarction versus unstable angina.   Past Medical History  Diagnosis Date  . TIA (transient ischemic attack) 12/2006    Dr. Leonie Man  . OSA (obstructive sleep apnea)   . COPD (chronic obstructive pulmonary disease)   . Rhinitis   . Hypertension   . DM (diabetes mellitus)   . DJD (degenerative joint disease)   . Dyslipidemia   . Hiatal hernia   . Hemorrhoids   . Diverticulosis   . Overactive bladder   . Shingles   . Asthma   . Seasonal allergies   . GERD  (gastroesophageal reflux disease)   . Anemia   . Bilateral sensorineural hearing loss   . Osteoarthritis   . Benign prostatic hypertrophy   . CAD (coronary artery disease) 07/2003    single vessel CAD post stent of RCA, by Dr. Acie Fredrickson  . Stroke     Medications Prior to Admission  Medication Sig Dispense Refill  . albuterol (PROVENTIL) (2.5 MG/3ML) 0.083% nebulizer solution Take 0.83 mg by nebulization every 4 (four) hours as needed.       Marland Kitchen allopurinol (ZYLOPRIM) 100 MG tablet Take 100 mg by mouth daily.       Marland Kitchen aspirin 81 MG tablet Take 81 mg by mouth daily.       Marland Kitchen atorvastatin (LIPITOR) 20 MG tablet Take 20 mg by mouth daily.        . budesonide-formoterol (SYMBICORT) 160-4.5 MCG/ACT inhaler Inhale 2 puffs into the lungs 2 (two) times daily.        . clopidogrel (PLAVIX) 75 MG tablet TAKE 1 TABLET DAILY (NEED APPOINTMENT FOR MORE REFILLS)  90 tablet  1  . diltiazem (DILTZAC) 180 MG 24 hr capsule Take 1 capsule (180 mg total) by mouth daily.  30 capsule  11  . furosemide (LASIX) 20 MG tablet Take 20 mg by mouth daily.        . iron polysaccharides (NIFEREX) 150 MG capsule Take 150 mg by mouth daily.       Marland Kitchen loratadine (CLARITIN) 10 MG  tablet Take 10 mg by mouth daily.        . montelukast (SINGULAIR) 10 MG tablet Take 10 mg by mouth at bedtime.        . pantoprazole (PROTONIX) 40 MG tablet Take 40 mg by mouth 2 (two) times daily.       . predniSONE (DELTASONE) 5 MG tablet 2.5 daily      . roflumilast (DALIRESP) 500 MCG TABS tablet Take 500 mcg by mouth daily.        . sitaGLIPtan-metformin (JANUMET) 50-500 MG per tablet Take 1 tablet by mouth 2 (two) times daily with a meal.        . solifenacin (VESICARE) 5 MG tablet Take 10 mg by mouth daily.      . Tamsulosin HCl (FLOMAX) 0.4 MG CAPS Take 0.4 mg by mouth daily after supper.        . telmisartan (MICARDIS) 80 MG tablet Take 80 mg by mouth daily.        Marland Kitchen tiotropium (SPIRIVA) 18 MCG inhalation capsule Place 18 mcg into inhaler and  inhale daily.        . cetaphil (CETAPHIL) cream Apply 1 application topically as needed (itching). As needed for itching      . HYDROcodone-acetaminophen (VICODIN) 5-500 MG per tablet As needed for pain       . hydrOXYzine (ATARAX/VISTARIL) 25 MG tablet Take 25 mg by mouth as needed for itching or anxiety.       . nitroGLYCERIN (NITROSTAT) 0.4 MG SL tablet Place 0.4 mg under the tongue every 5 (five) minutes as needed.          ADDITIONAL HOME MEDICATIONS: no additional medications  PHYSICIANS INVOLVED IN CARE: Janie Morning (PCP), Liam Rogers (card), [name] (pulmonary), Pramad Sethi (neuorolgy)  Past Surgical History  Procedure Laterality Date  . L5 laminectomy  2004    Dr. Sherwood Gambler  . Right total knee  2006    Dr. Maureen Ralphs  . Coronary angioplasty with stent placement  07/2003    Est. EF of 65% with stent to the RCA, by Dr. Acie Fredrickson  . Inner ear surgery      Family History  Problem Relation Age of Onset  . Lung disease Father   . Lung disease Brother   . Asthma Father   . Asthma Brother   . Sudden death Mother     unknown causes  . Sudden death Brother 1    unknown causes  . Sudden death Sister     unknown causes  . Sudden death Sister     unknown causes     Social History:  reports that he quit smoking about 24 years ago. His smoking use included Cigarettes. He has a 43 pack-year smoking history. He has never used smokeless tobacco. He reports that  drinks alcohol. He reports that he does not use illicit drugs.  Allergies:  Allergies  Allergen Reactions  . Ciprofloxacin Hcl Other (See Comments)    Unknown   . Sulfonamide Derivatives     Unknown      ROS: anemia, arthritis, diabetes, emphysema, Heart disease, high blood pressure, shortness of breath and obstructive sleep apnea, mild forgetfulness, hearing loss  PHYSICAL EXAM: Blood pressure 153/74, pulse 85, temperature 98.2 F (36.8 C), temperature source Oral, resp. rate 21, height 5\' 5"  (1.651 m), weight  64.864 kg (143 lb), SpO2 98.00%. In general, he is a well-nourished well-developed man of [place] descent, who had audible wheezing on exam with mild dyspnea  with conversation.  HEENT exam was within normal limits, neck was supple and had jugular venous distention halfway to the mandible when sitting upright, chest had bilateral scattered wheezing with mild use of accessory muscles of respiration, heart had a regular rate and rhythm without significant murmur or gallop, abdomen had normal bowel sounds and no tenderness, extremities were without cyanosis, clubbing, or edema.  He was alert and well oriented with a normal affect and able to move all extremities well.  Results for orders placed during the hospital encounter of 01/04/13 (from the past 48 hour(s))  GLUCOSE, CAPILLARY     Status: Abnormal   Collection Time    01/04/13  1:33 PM      Result Value Range   Glucose-Capillary 100 (*) 70 - 99 mg/dL   Comment 1 Documented in Chart     Comment 2 Notify RN    PROTIME-INR     Status: None   Collection Time    01/04/13  3:05 PM      Result Value Range   Prothrombin Time 13.1  11.6 - 15.2 seconds   INR 1.01  0.00 - 1.49  CBC     Status: Abnormal   Collection Time    01/04/13  3:18 PM      Result Value Range   WBC 12.1 (*) 4.0 - 10.5 K/uL   RBC 4.87  4.22 - 5.81 MIL/uL   Hemoglobin 14.4  13.0 - 17.0 g/dL   HCT 41.5  39.0 - 52.0 %   MCV 85.2  78.0 - 100.0 fL   MCH 29.6  26.0 - 34.0 pg   MCHC 34.7  30.0 - 36.0 g/dL   RDW 14.1  11.5 - 15.5 %   Platelets 233  150 - 400 K/uL   No results found. 12-lead EKG in the office showed the following: Normal sinus rhythm, nonspecific lateral lead T-wave abnormalities  Chest x-ray (PA and lateral) in the office showed the following: No acute cardiopulmonary disease with hyperexpanded lungs (preliminary reading)  Peak airflow readings in the office: 130 L/m  Assessment/Plan #1 Chest Pain:  His chest pain is most likely from an exacerbation of COPD  and less likely from non-ST elevation myocardial infarction or unstable angina.  It is also unlikely that his chest pain is from gastroesophageal reflux or a chest wall syndrome.  We will start him on heparin IV and he has nitroglycerin sublingual as needed.  We will check serial cardiac isoenzymes and request a cardiologist consultation given his history of known coronary artery disease with multiple risk factors. #2 Dyspnea on Exertion: Most likely from COPD exacerbation.  We will continue treatment with high-dose IV corticosteroids and frequent nebulizers. I expect that he will need a prednisone taper as an outpatient. #3 Diabetes Mellitus, Type II: Stable with hemoglobin A1c levels running around 7.0-7.5%.  His blood glucose levels are likely to rise significantly on IV corticosteroids so he will be on sliding scale insulin as needed.  Samyrah Bruster G 01/04/2013, 4:04 PM

## 2013-01-04 NOTE — Consult Note (Signed)
Patient ID: [name] MRN: [id number] DOB/AGE: July 22, 1928 79 y.o.  Admit date: 01/04/2013 Referring Physician: Philip Aspen Primary Physician: Leanna Battles Primary Cardiologist: Nahser Reason for Consultation: Chest pain and SOB  HPI: 79 yo male with history of CAD s/p RCA stent in 2005, DM, HTN, TIA, OSA, COPD, HLD, hiatal hernia, GERD, OA.  He is quite active, walking on a treadmill for 30 mins most days of the weeky and also using light weights for an additional 15-30 minutes.  Over the past 2 weeks he's had gradually increasing wheezing and dyspnea on exertion, now present with walking at a faster pace.  He walked on the treadmill yesterday morning and afterward didn't feel like weight training so he went home.  Sometime around noon, he noted mild left chest tightness.  He didn't think too much of it but it persisted all day yesterday.  He fell asleep with it last night and it was present when he awoke this AM.  Due to persistent discomfort, he saw his PCP today.  A 12 lead ECG apparently was non-acute and CXR showed no active cardiopulm dzs.  Peak flow was 130.  He was treated with IM steroid and admitted to [facility] to r/o MI.  Despite prolonged, ongoing mild c/p, his initial troponin was normal. We've been asked to eval.  Past Medical History  Diagnosis Date  . TIA (transient ischemic attack) 12/2006    Dr. Leonie Man  . OSA (obstructive sleep apnea)   . COPD (chronic obstructive pulmonary disease)   . Rhinitis   . Hypertension   . DM (diabetes mellitus)   . DJD (degenerative joint disease)   . Dyslipidemia   . Hiatal hernia   . Hemorrhoids   . Diverticulosis   . Overactive bladder   . Shingles   . Asthma   . Seasonal allergies   . GERD (gastroesophageal reflux disease)   . Anemia   . Bilateral sensorineural hearing loss   . Osteoarthritis   . Benign prostatic hypertrophy   . CAD (coronary artery disease) 07/2003    single vessel CAD post stent of RCA, by Dr. Acie Fredrickson  . Stroke       Family History  Problem Relation Age of Onset  . Lung disease Father   . Lung disease Brother   . Asthma Father   . Asthma Brother   . Sudden death Mother     unknown causes  . Sudden death Brother 49    unknown causes  . Sudden death Sister     unknown causes  . Sudden death Sister     unknown causes    History   Social History  . Marital Status: Married    Spouse Name: N/A    Number of Children: N/A  . Years of Education: N/A   Occupational History  . retired professor    Social History Main Topics  . Smoking status: Former Smoker -- 1.00 packs/day for 43 years    Types: Cigarettes    Quit date: 05/02/1988  . Smokeless tobacco: Never Used  . Alcohol Use: Yes     Comment: RARE  . Drug Use: No  . Sexual Activity: Not on file   Other Topics Concern  . Not on file   Social History Narrative  . No narrative on file    Past Surgical History  Procedure Laterality Date  . L5 laminectomy  2004    Dr. Sherwood Gambler  . Right total knee  2006    Dr.  Alusio  . Coronary angioplasty with stent placement  07/2003    Est. EF of 65% with stent to the RCA, by Dr. Acie Fredrickson  . Inner ear surgery      Allergies  Allergen Reactions  . Ciprofloxacin Hcl Other (See Comments)    Unknown   . Sulfonamide Derivatives     Unknown     Prescriptions prior to admission  Medication Sig Dispense Refill  . albuterol (PROVENTIL) (2.5 MG/3ML) 0.083% nebulizer solution Take 0.83 mg by nebulization every 4 (four) hours as needed.       Marland Kitchen allopurinol (ZYLOPRIM) 100 MG tablet Take 100 mg by mouth daily.       Marland Kitchen aspirin 81 MG tablet Take 81 mg by mouth daily.       Marland Kitchen atorvastatin (LIPITOR) 20 MG tablet Take 20 mg by mouth daily.        . budesonide-formoterol (SYMBICORT) 160-4.5 MCG/ACT inhaler Inhale 2 puffs into the lungs 2 (two) times daily.        . clopidogrel (PLAVIX) 75 MG tablet TAKE 1 TABLET DAILY (NEED APPOINTMENT FOR MORE REFILLS)  90 tablet  1  . diltiazem (DILTZAC) 180 MG 24 hr  capsule Take 1 capsule (180 mg total) by mouth daily.  30 capsule  11  . furosemide (LASIX) 20 MG tablet Take 20 mg by mouth daily.        . iron polysaccharides (NIFEREX) 150 MG capsule Take 150 mg by mouth daily.       Marland Kitchen loratadine (CLARITIN) 10 MG tablet Take 10 mg by mouth daily.        . montelukast (SINGULAIR) 10 MG tablet Take 10 mg by mouth at bedtime.        . pantoprazole (PROTONIX) 40 MG tablet Take 40 mg by mouth 2 (two) times daily.       . predniSONE (DELTASONE) 5 MG tablet 2.5 daily      . roflumilast (DALIRESP) 500 MCG TABS tablet Take 500 mcg by mouth daily.        . sitaGLIPtan-metformin (JANUMET) 50-500 MG per tablet Take 1 tablet by mouth 2 (two) times daily with a meal.        . solifenacin (VESICARE) 5 MG tablet Take 10 mg by mouth daily.      . Tamsulosin HCl (FLOMAX) 0.4 MG CAPS Take 0.4 mg by mouth daily after supper.        . telmisartan (MICARDIS) 80 MG tablet Take 80 mg by mouth daily.        Marland Kitchen tiotropium (SPIRIVA) 18 MCG inhalation capsule Place 18 mcg into inhaler and inhale daily.        . cetaphil (CETAPHIL) cream Apply 1 application topically as needed (itching). As needed for itching      . HYDROcodone-acetaminophen (VICODIN) 5-500 MG per tablet As needed for pain       . hydrOXYzine (ATARAX/VISTARIL) 25 MG tablet Take 25 mg by mouth as needed for itching or anxiety.       . nitroGLYCERIN (NITROSTAT) 0.4 MG SL tablet Place 0.4 mg under the tongue every 5 (five) minutes as needed.          Review of systems complete and found to be negative unless listed above    Physical Exam: Blood pressure 153/74, pulse 85, temperature 98.2 F (36.8 C), temperature source Oral, resp. rate 21, height 5\' 5"  (1.651 m), weight 143 lb (64.864 kg), SpO2 98.00%.  General: Well developed, well nourished, NAD  HEENT:  OP clear, mucus membranes moist  SKIN: warm, dry. No rashes.  Neuro: No focal deficits  Musculoskeletal: Muscle strength 5/5 all ext  Psychiatric: Mood and affect  normal  Neck: No JVD, no carotid bruits, no thyromegaly, no lymphadenopathy.  Lungs:Clear bilaterally, scatt rhonchi, and exp wheezing. No crackles  Cardiovascular: Regular rate and rhythm. No murmurs, gallops or rubs.  Abdomen:Soft. Bowel sounds present. Non-tender.  Extremities: No lower extremity edema. Pulses are 2 + in the bilateral DP/PT.   Labs:   Lab Results  Component Value Date   WBC 12.1* 01/04/2013   HGB 14.4 01/04/2013   HCT 41.5 01/04/2013   MCV 85.2 01/04/2013   PLT 233 01/04/2013     Recent Labs Lab 01/04/13 1518  NA 140  K 4.2  CL 102  CO2 23  BUN 15  CREATININE 0.94  CALCIUM 10.6*  PROT 6.8  BILITOT 0.4  ALKPHOS 47  ALT 16  AST 13  GLUCOSE 128*   Lab Results  Component Value Date             TROPONINI <0.30 01/04/2013    Cardiac cath 07/31/03: The left main coronary artery is relatively smooth and normal.  The left anterior descending artery has minor luminal irregularities. There  is a slight amount of calcium in the proximal aspect of the vessel. The  remainder of the vessel has minor luminal irregularities, but no significant  stenosis. The first diagonal vessel is moderate-sized vessel. There is a  very small degree of stenosis in the mid segment of this vessel of about  25%.  The left circumflex artery is a moderate-to-large vessel. It gives off a  high obtuse marginal artery. There is a 20-30% stenosis in the proximal  aspect of this first OM, but no critical stenosis.  The circumflex continues around the AV groove. There is 10-20% stenosis in  the proximal circumflex vessel.  The right coronary artery is large and dominant. There is an ulcerated  plaque in the mid segment. This plaque seems to extend to between 15 and 20  mm in length. There is TIMI grade 3 flow through this area.  LEFT VENTRICULOGRAM: A left ventriculogram was performed in a 30 RAO  position. It revealed overall well-preserved left ventricular systolic  function. There is a  very small area of hypokinesis in the inferior wall,  but the overall LV function is normal. The EF is 65%. There is no mitral  regurgitation.  PTCA: The patient was given 300 mg of Plavix followed by a double bolus  Integrilin drip, and heparin (Wydase). The right coronary artery was  engaged using the 7-French Judkins right 4 side hole guide. The right  coronary artery was wired using a BMW guidewire. A 3.0 x20 mm Quantum Mavik  was positioned across the stenosis and was inflated up to 4 atmospheres for  17 seconds. This resulted in some improvement of the stenosis, but with  slight extension of the dissection plane. At this point, a 3.0 x28 mm Taxus  stent was positioned across the stenosis. It was deployed at 14 atmospheres  for 30 seconds. Post-dilatation was achieved using a 3.25 x 15 mm Quantum  Mavik. It was positioned distally and was inflated up to 12 atmospheres,  and then was pulled back proximally and was inflated up to 16 atmospheres.  This resulted in a very nice angiographic lumen with a 0% residual stenosis.  There was a very small RV marginal branch which was occluded during the  procedure. This was an expected result because of the severe plaque burden  and the tenuous flow that the patient started with.    Radiology:  EKG:  ASSESSMENT AND PLAN: 79 yo male with history of COPD and CAD admitted with worsening dyspnea and wheezing with mild chest pressure.   1. CAD/chest pain: He is known to have a stent in his mid RCA (1st generation DES-Taxus) placed in 2005. EKG does not show ischemic changes. He has had no recent exertional angina. First troponin is negative. His presentation is most consistent with COPD exacerbation but agree with following serial cardiac markers tonight. We will follow with you this weekend and make further recs regarding need for ischemic evaluation.   Signed: Murray Hodgkins, NP 01/04/2013, 5:00 PM Patient seen and examined. I agree with the  assessment and plan as detailed above. See also my additional thoughts below. I have reviewed all of the information carefully and discussed this fully with Mr. Sharolyn Douglas.   This very pleasant gentleman is the father of [name]. He has known coronary disease and known lung disease. His symptoms at this time are probably  related to his pulmonary status. However because he had slight localized chest discomfort,   we are making sure that there is not any cardiac basis for his current problem. He is on IV heparin. This can be stopped if his troponins come back normal. We will follow him with you. As of this evening, no further cardiac workup is planned other than his enzymes and followup EKG.  Dola Argyle, MD, [facility] 01/04/2013 6:08 PM

## 2013-01-04 NOTE — Progress Notes (Signed)
Utilization review completed.  

## 2013-01-05 LAB — CBC
HCT: 39.1 % (ref 39.0–52.0)
Hemoglobin: 13.8 g/dL (ref 13.0–17.0)
MCHC: 35.3 g/dL (ref 30.0–36.0)
RBC: 4.68 MIL/uL (ref 4.22–5.81)
WBC: 10.9 10*3/uL — ABNORMAL HIGH (ref 4.0–10.5)

## 2013-01-05 LAB — GLUCOSE, CAPILLARY
Glucose-Capillary: 316 mg/dL — ABNORMAL HIGH (ref 70–99)
Glucose-Capillary: 322 mg/dL — ABNORMAL HIGH (ref 70–99)

## 2013-01-05 LAB — BASIC METABOLIC PANEL
Chloride: 100 mEq/L (ref 96–112)
GFR calc Af Amer: 87 mL/min — ABNORMAL LOW (ref >90)
GFR calc non Af Amer: 75 mL/min — ABNORMAL LOW (ref >90)
Potassium: 4.2 mEq/L (ref 3.5–5.1)
Sodium: 136 mEq/L (ref 135–145)

## 2013-01-05 MED ORDER — PREDNISONE 5 MG PO TABS: 10.0000 mg | ORAL_TABLET | Freq: Every day | ORAL | Status: DC

## 2013-01-05 NOTE — Progress Notes (Signed)
Subjective:  The patient feels well this morning.  No further chest discomfort.  His repeat electrocardiogram this morning is normal.  Serial troponins are normal.  Telemetry shows normal sinus rhythm with occasional PVC.  Objective:  Vital Signs in the last 24 hours: Temp:  [98.1 F (36.7 C)-98.8 F (37.1 C)] 98.1 F (36.7 C) (09/06 0500) Pulse Rate:  [85-86] 86 (09/06 0500) Resp:  [20-21] 20 (09/06 0500) BP: (149-153)/(63-74) 149/71 mmHg (09/06 0500) SpO2:  [94 %-98 %] 97 % (09/06 0500) Weight:  [143 lb (64.864 kg)] 143 lb (64.864 kg) (09/05 1320)  Intake/Output from previous day: 09/05 0701 - 09/06 0700 In: 560 [P.O.:560] Out: -  Intake/Output from this shift:    . allopurinol  100 mg Oral Daily  . aspirin EC  81 mg Oral Daily  . atorvastatin  20 mg Oral Daily  . budesonide-formoterol  2 puff Inhalation BID  . clopidogrel  75 mg Oral Q breakfast  . darifenacin  7.5 mg Oral Daily  . diltiazem  180 mg Oral Daily  . furosemide  20 mg Oral Daily  . insulin aspart  0-15 Units Subcutaneous TID WC  . insulin aspart  0-5 Units Subcutaneous QHS  . irbesartan  75 mg Oral Daily  . iron polysaccharides  150 mg Oral Daily  . loratadine  10 mg Oral Daily  . methylPREDNISolone (SOLU-MEDROL) injection  40 mg Intravenous Q6H  . montelukast  10 mg Oral QHS  . pantoprazole  40 mg Oral BID  . roflumilast  500 mcg Oral Daily  . sodium chloride  3 mL Intravenous Q12H  . tamsulosin  0.4 mg Oral QPC supper  . tiotropium  18 mcg Inhalation Daily   . 0.9 % NaCl with KCl 20 mEq / L 20 mL/hr (01/04/13 1624)  . heparin 800 Units/hr (01/04/13 1724)    Physical Exam: The patient appears to be in no distress.  Head and neck exam reveals that the pupils are equal and reactive.  The extraocular movements are full.  There is no scleral icterus.  Mouth and pharynx are benign.  No lymphadenopathy.  No carotid bruits.  The jugular venous pressure is normal.  Thyroid is not enlarged or  tender.  Chest is clear to percussion and auscultation.  No rales.  Very mild expiratory wheeze. Expansion of the chest is symmetrical.  Heart reveals no abnormal lift or heave.  First and second heart sounds are normal.  There is no murmur gallop rub or click.  The abdomen is soft and nontender.  Bowel sounds are normoactive.  There is no hepatosplenomegaly or mass.  There are no abdominal bruits.  Extremities reveal no phlebitis or edema.  Pedal pulses are good.  There is no cyanosis or clubbing.  Neurologic exam is normal strength and no lateralizing weakness.  No sensory deficits.  Integument reveals no rash  Lab Results:  Recent Labs  01/04/13 1518 01/05/13 0432  WBC 12.1* 10.9*  HGB 14.4 13.8  PLT 233 235    Recent Labs  01/04/13 1518 01/05/13 0432  NA 140 136  K 4.2 4.2  CL 102 100  CO2 23 23  GLUCOSE 128* 286*  BUN 15 20  CREATININE 0.94 0.97    Recent Labs  01/04/13 2200 01/05/13 0432  TROPONINI <0.30 <0.30   Hepatic Function Panel  Recent Labs  01/04/13 1518  PROT 6.8  ALBUMIN 3.8  AST 13  ALT 16  ALKPHOS 47  BILITOT 0.4   No results  found for this basename: CHOL,  in the last 72 hours No results found for this basename: PROTIME,  in the last 72 hours  Imaging: X-ray Chest Pa And Lateral   01/04/2013   *RADIOLOGY REPORT*  Clinical Data: Chest pain.  CHEST - 2 VIEW  Comparison: 04/05/2007  Findings: Two views of the chest were obtained.  Lungs are clear bilaterally. Heart and mediastinum are within normal limits.  There may be a coronary stent along the anterior aspect of the heart. Mild degenerative changes in the thoracic spine.  The trachea is midline.  IMPRESSION: No acute cardiopulmonary disease.   Original Report Authenticated By: Markus Daft, M.D.    Cardiac Studies: Telemetry normal  Assessment/Plan:  1.  Coronary artery disease with history of Taxus stent to his mid RCA in 2005.  No evidence  this admission for myocardial ischemia or  damage. 2.  chest tightness and dyspnea likely secondary to mild COPD exacerbation  Plan: No further inpatient cardiac workup planned.  Will stop IV heparin.  Okay for discharge today from cardiac standpoint with followup with Dr. Acie Fredrickson in the office.   LOS: 1 day    Darlin Coco 01/05/2013, 8:35 AM

## 2013-01-05 NOTE — Progress Notes (Signed)
ANTICOAGULATION CONSULT NOTE - [place] for heparin Indication: chest pain/ACS  Allergies  Allergen Reactions  . Ciprofloxacin Hcl Other (See Comments)    Unknown   . Sulfonamide Derivatives     Unknown     Patient Measurements: Height: 5\' 5"  (165.1 cm) Weight: 143 lb (64.864 kg) IBW/kg (Calculated) : 61.5 Heparin Dosing Weight: 64.9kg  Vital Signs: Temp: 98.8 F (37.1 C) (09/05 2100) BP: 150/63 mmHg (09/05 2100) Pulse Rate: 86 (09/05 2100)  Labs:  Recent Labs  01/04/13 1505 01/04/13 1518 01/04/13 2200 01/05/13 0022  HGB  --  14.4  --   --   HCT  --  41.5  --   --   PLT  --  233  --   --   LABPROT 13.1  --   --   --   INR 1.01  --   --   --   HEPARINUNFRC  --   --   --  0.51  CREATININE  --  0.94  --   --   TROPONINI  --  <0.30 <0.30  --     Estimated Creatinine Clearance: 53.6 ml/min (by C-G formula based on Cr of 0.94).  Assessment: 83 yom on IV heparin for ACS/CP. Heparin level therapeutic. No bleeding noted. Plan to stop IV heparin if troponins come back wnl per caridology note. Troponin negative x 2. Baseline CBC, INR wnl.  Goal of Therapy:  Heparin level 0.3-0.7 units/ml Monitor platelets by anticoagulation protocol: Yes   Plan:  1. Continue Heparin gtt 800 units/hr 2. Daily heparin level and CBC 3. F/u troponin and ability to d/c heparin  Sherlon Handing, PharmD, BCPS Clinical pharmacist, pager [phone] 01/05/2013,1:55 AM

## 2013-01-05 NOTE — Progress Notes (Signed)
Utilization Review completed.  

## 2013-01-05 NOTE — Discharge Summary (Signed)
Physician Discharge Summary  Patient ID: [name] MRN: [id number] DOB/AGE: [birth year] 79 y.o.  Admit date: 01/04/2013 Discharge date: 01/05/2013  Admission Diagnoses:  Chest pain  Discharge Diagnoses:  Principal Problem:   Chest pain Active Problems:   OBSTRUCTIVE SLEEP APNEA   CHRONIC OBSTRUCTIVE PULMONARY DISEASE, SEVERE, with acute exacerbation   Coronary artery disease   Dyspnea   Type II or unspecified type diabetes mellitus with neurological manifestations, not stated as uncontrolled(250.60)   Discharged Condition: good  Hospital Course: [name] is a pleasant 79 yo male with multiple medical problems.  He presented with several days of increased cough, chest congestion and increased wheezing. He also had some left sided chest pains.  He was admitted.   Serial cardiac enzymes were normal.  His CP resolved.   ECG was unchanged.  He was treated with albuterol and IV corticosteroids.  He felt symptomatically improved on the day following admission.  Cardiology felt that there was no new acute cardiac problem.  He discharged home to complete further therapy for COPD exacerbation in stable condition.  Consults: cardiology  Significant Diagnostic Studies: none  Treatments: IV heparin gtt, IV corticosteroids,  Nebulizer treatments  Discharge Exam:  Blood pressure 149/71, pulse 86, temperature 98.1 F (36.7 C), temperature source Oral, resp. rate 20, height 5\' 5"  (1.651 m), weight 64.864 kg (143 lb), SpO2 97.00%.  Physical Exam:Age appropriate gentleman sitting on the side of the bed in NAD. He is A and O times 4. He is grossly Neurologically intact.  Lungs reveal bronchial breath sounds but there is good air movement bilaterally and no W/R/R at the time of examination.  Heart is RRR with no m/r/g.  Abd is soft, nt, nd, no mass or hsm.  NO C/C/E.  Disposition:discharge to home  Discharge Orders   Future Orders Complete By Expires   Diet - low sodium heart healthy  As  directed    Discharge instructions  As directed    Comments:     Follow up with dr. Acie Fredrickson in one week and follow up with Dr. Philip Aspen in one week Check your blood sugars at home twice daily  (before breakfast and supper). Keep a log of these readings. Call if blood sugar is staying over 200. Also take one align otc probiotic pill daily for 3 weeks. Also take azithromycin 500 mg one daily for 3 days (see written script).   Increase activity slowly  As directed        Medication List         albuterol (2.5 MG/3ML) 0.083% nebulizer solution  Commonly known as:  PROVENTIL  Take 0.83 mg by nebulization every 4 (four) hours as needed.     allopurinol 100 MG tablet  Commonly known as:  ZYLOPRIM  Take 100 mg by mouth daily.     aspirin 81 MG tablet  Take 81 mg by mouth daily.     atorvastatin 20 MG tablet  Commonly known as:  LIPITOR  Take 20 mg by mouth daily.     budesonide-formoterol 160-4.5 MCG/ACT inhaler  Commonly known as:  SYMBICORT  Inhale 2 puffs into the lungs 2 (two) times daily.     cetaphil cream  Apply 1 application topically as needed (itching). As needed for itching     clopidogrel 75 MG tablet  Commonly known as:  PLAVIX  TAKE 1 TABLET DAILY (NEED APPOINTMENT FOR MORE REFILLS)     diltiazem 180 MG 24 hr capsule  Commonly known as:  DILTZAC  Take 1 capsule (180 mg total) by mouth daily.     furosemide 20 MG tablet  Commonly known as:  LASIX  Take 20 mg by mouth daily.     HYDROcodone-acetaminophen 5-500 MG per tablet  Commonly known as:  VICODIN  As needed for pain     hydrOXYzine 25 MG tablet  Commonly known as:  ATARAX/VISTARIL  Take 25 mg by mouth as needed for itching or anxiety.     iron polysaccharides 150 MG capsule  Commonly known as:  NIFEREX  Take 150 mg by mouth daily.     loratadine 10 MG tablet  Commonly known as:  CLARITIN  Take 10 mg by mouth daily.     montelukast 10 MG tablet  Commonly known as:  SINGULAIR  Take 10 mg by  mouth at bedtime.     nitroGLYCERIN 0.4 MG SL tablet  Commonly known as:  NITROSTAT  Place 0.4 mg under the tongue every 5 (five) minutes as needed.     pantoprazole 40 MG tablet  Commonly known as:  PROTONIX  Take 40 mg by mouth 2 (two) times daily.     predniSONE 5 MG tablet  Commonly known as:  DELTASONE  - Take 2 tablets (10 mg total) by mouth daily. Actually, you are to take a prednisone taper as outlined on the written prescription that I have given to you.  - Take 40 mg daily for 2 days, then take 30 mg daily for 2 days, then take 20 mg daily for two days, then take 10 mg daily for two days then stop     roflumilast 500 MCG Tabs tablet  Commonly known as:  DALIRESP  Take 500 mcg by mouth daily.     sitaGLIPtin-metformin 50-500 MG per tablet  Commonly known as:  JANUMET  Take 1 tablet by mouth 2 (two) times daily with a meal.     solifenacin 5 MG tablet  Commonly known as:  VESICARE  Take 10 mg by mouth daily.     tamsulosin 0.4 MG Caps capsule  Commonly known as:  FLOMAX  Take 0.4 mg by mouth daily after supper.     telmisartan 80 MG tablet  Commonly known as:  MICARDIS  Take 80 mg by mouth daily.     tiotropium 18 MCG inhalation capsule  Commonly known as:  SPIRIVA  Place 18 mcg into inhaler and inhale daily.      Azithromycin 500 mg one daily for 3 days. Align one daily otc for 3 weeks.   SignedCrist Infante A 01/05/2013, 10:23 AM

## 2013-01-30 ENCOUNTER — Other Ambulatory Visit (HOSPITAL_COMMUNITY): Payer: Self-pay | Admitting: Internal Medicine

## 2013-01-30 ENCOUNTER — Ambulatory Visit (HOSPITAL_COMMUNITY): Admission: RE | Admit: 2013-01-30 | Payer: Medicare PPO | Source: Ambulatory Visit

## 2013-01-30 ENCOUNTER — Ambulatory Visit (HOSPITAL_COMMUNITY)
Admission: RE | Admit: 2013-01-30 | Discharge: 2013-01-30 | Disposition: A | Discharge status: Home / Self Care | Payer: Medicare PPO | Source: Ambulatory Visit | Attending: Internal Medicine | Admitting: Internal Medicine

## 2013-01-30 DIAGNOSIS — R609 Edema, unspecified: Secondary | ICD-10-CM

## 2013-02-24 ENCOUNTER — Encounter (HOSPITAL_COMMUNITY): Payer: Self-pay | Admitting: Emergency Medicine

## 2013-02-24 ENCOUNTER — Inpatient Hospital Stay (HOSPITAL_COMMUNITY)
Admission: EM | Admit: 2013-02-24 | Discharge: 2013-03-01 | DRG: 194 | Disposition: A | Discharge status: Home / Self Care | Payer: Medicare PPO | Attending: Internal Medicine | Admitting: Internal Medicine

## 2013-02-24 ENCOUNTER — Emergency Department (HOSPITAL_COMMUNITY): Payer: Medicare PPO

## 2013-02-24 DIAGNOSIS — E785 Hyperlipidemia, unspecified: Secondary | ICD-10-CM | POA: Diagnosis present

## 2013-02-24 DIAGNOSIS — J189 Pneumonia, unspecified organism: Principal | ICD-10-CM | POA: Diagnosis present

## 2013-02-24 DIAGNOSIS — Z9861 Coronary angioplasty status: Secondary | ICD-10-CM

## 2013-02-24 DIAGNOSIS — J449 Chronic obstructive pulmonary disease, unspecified: Secondary | ICD-10-CM | POA: Diagnosis present

## 2013-02-24 DIAGNOSIS — I959 Hypotension, unspecified: Secondary | ICD-10-CM | POA: Diagnosis present

## 2013-02-24 DIAGNOSIS — R5381 Other malaise: Secondary | ICD-10-CM | POA: Diagnosis present

## 2013-02-24 DIAGNOSIS — R0902 Hypoxemia: Secondary | ICD-10-CM | POA: Diagnosis present

## 2013-02-24 DIAGNOSIS — I10 Essential (primary) hypertension: Secondary | ICD-10-CM | POA: Diagnosis present

## 2013-02-24 DIAGNOSIS — E1149 Type 2 diabetes mellitus with other diabetic neurological complication: Secondary | ICD-10-CM | POA: Diagnosis present

## 2013-02-24 DIAGNOSIS — I509 Heart failure, unspecified: Secondary | ICD-10-CM | POA: Diagnosis present

## 2013-02-24 DIAGNOSIS — E2749 Other adrenocortical insufficiency: Secondary | ICD-10-CM | POA: Diagnosis present

## 2013-02-24 DIAGNOSIS — J438 Other emphysema: Secondary | ICD-10-CM | POA: Diagnosis present

## 2013-02-24 DIAGNOSIS — Z8673 Personal history of transient ischemic attack (TIA), and cerebral infarction without residual deficits: Secondary | ICD-10-CM

## 2013-02-24 DIAGNOSIS — Z87891 Personal history of nicotine dependence: Secondary | ICD-10-CM

## 2013-02-24 DIAGNOSIS — K219 Gastro-esophageal reflux disease without esophagitis: Secondary | ICD-10-CM | POA: Diagnosis present

## 2013-02-24 DIAGNOSIS — B370 Candidal stomatitis: Secondary | ICD-10-CM | POA: Diagnosis not present

## 2013-02-24 DIAGNOSIS — I2510 Atherosclerotic heart disease of native coronary artery without angina pectoris: Secondary | ICD-10-CM | POA: Diagnosis present

## 2013-02-24 DIAGNOSIS — R351 Nocturia: Secondary | ICD-10-CM | POA: Diagnosis present

## 2013-02-24 DIAGNOSIS — G4733 Obstructive sleep apnea (adult) (pediatric): Secondary | ICD-10-CM | POA: Diagnosis present

## 2013-02-24 LAB — CBC WITH DIFFERENTIAL/PLATELET
Eosinophils Relative: 1 % (ref 0–5)
HCT: 31.3 % — ABNORMAL LOW (ref 39.0–52.0)
Lymphs Abs: 1.3 10*3/uL (ref 0.7–4.0)
MCV: 84.6 fL (ref 78.0–100.0)
Monocytes Relative: 8 % (ref 3–12)
Platelets: 195 10*3/uL (ref 150–400)
WBC Morphology: INCREASED

## 2013-02-24 LAB — URINALYSIS, ROUTINE W REFLEX MICROSCOPIC
Bilirubin Urine: NEGATIVE
Ketones, ur: NEGATIVE mg/dL
Nitrite: NEGATIVE
Protein, ur: NEGATIVE mg/dL

## 2013-02-24 LAB — COMPREHENSIVE METABOLIC PANEL
ALT: 10 U/L (ref 0–53)
AST: 10 U/L (ref 0–37)
Alkaline Phosphatase: 61 U/L (ref 39–117)
CO2: 24 mEq/L (ref 19–32)
Chloride: 101 mEq/L (ref 96–112)
Creatinine, Ser: 1.82 mg/dL — ABNORMAL HIGH (ref 0.50–1.35)
GFR calc non Af Amer: 33 mL/min — ABNORMAL LOW (ref >90)
Total Bilirubin: 0.5 mg/dL (ref 0.3–1.2)

## 2013-02-24 LAB — GLUCOSE, CAPILLARY

## 2013-02-24 LAB — URINE MICROSCOPIC-ADD ON

## 2013-02-24 MED ORDER — SODIUM CHLORIDE 0.9 % IV BOLUS (SEPSIS)
1000.0000 mL | Freq: Once | INTRAVENOUS | Status: AC
  Administered 2013-02-24: 1000 mL via INTRAVENOUS

## 2013-02-24 MED ORDER — TAMSULOSIN HCL 0.4 MG PO CAPS
0.4000 mg | ORAL_CAPSULE | Freq: Every evening | ORAL | Status: DC
  Administered 2013-02-24 – 2013-03-01 (×6): 0.4 mg via ORAL
  Filled 2013-02-24 (×6): qty 1

## 2013-02-24 MED ORDER — INSULIN ASPART 100 UNIT/ML ~~LOC~~ SOLN
3.0000 [IU] | Freq: Three times a day (TID) | SUBCUTANEOUS | Status: DC
  Administered 2013-02-24: 3 [IU] via SUBCUTANEOUS
  Administered 2013-02-25: 14:00:00 via SUBCUTANEOUS
  Administered 2013-02-25: 3 [IU] via SUBCUTANEOUS
  Administered 2013-02-25: 19:00:00 via SUBCUTANEOUS
  Administered 2013-02-26 – 2013-02-28 (×9): 3 [IU] via SUBCUTANEOUS

## 2013-02-24 MED ORDER — ROFLUMILAST 500 MCG PO TABS
500.0000 ug | ORAL_TABLET | Freq: Every day | ORAL | Status: DC
  Administered 2013-02-24 – 2013-03-01 (×6): 500 ug via ORAL
  Filled 2013-02-24 (×6): qty 1

## 2013-02-24 MED ORDER — GLIMEPIRIDE 2 MG PO TABS
2.0000 mg | ORAL_TABLET | Freq: Every day | ORAL | Status: DC
  Administered 2013-02-25 – 2013-02-28 (×4): 2 mg via ORAL
  Filled 2013-02-24 (×8): qty 1

## 2013-02-24 MED ORDER — DEXTROSE 5 % IV SOLN
1.0000 g | INTRAVENOUS | Status: DC
  Administered 2013-02-25 – 2013-03-01 (×5): 1 g via INTRAVENOUS
  Filled 2013-02-24 (×5): qty 10

## 2013-02-24 MED ORDER — MONTELUKAST SODIUM 10 MG PO TABS
10.0000 mg | ORAL_TABLET | Freq: Every day | ORAL | Status: DC
  Administered 2013-02-24 – 2013-02-28 (×5): 10 mg via ORAL
  Filled 2013-02-24 (×6): qty 1

## 2013-02-24 MED ORDER — SODIUM CHLORIDE 0.9 % IV SOLN: INTRAVENOUS | Status: AC

## 2013-02-24 MED ORDER — ZOLPIDEM TARTRATE 5 MG PO TABS: 5.0000 mg | ORAL_TABLET | Freq: Every evening | ORAL | Status: DC | PRN

## 2013-02-24 MED ORDER — HYDROCODONE-ACETAMINOPHEN 5-325 MG PO TABS: 1.0000 | ORAL_TABLET | ORAL | Status: DC | PRN

## 2013-02-24 MED ORDER — PROMETHAZINE HCL 25 MG PO TABS: 12.5000 mg | ORAL_TABLET | Freq: Four times a day (QID) | ORAL | Status: DC | PRN

## 2013-02-24 MED ORDER — DEXTROSE 5 % IV SOLN
1.0000 g | Freq: Once | INTRAVENOUS | Status: AC
  Administered 2013-02-24: 1 g via INTRAVENOUS
  Filled 2013-02-24: qty 10

## 2013-02-24 MED ORDER — PANTOPRAZOLE SODIUM 40 MG PO TBEC
40.0000 mg | DELAYED_RELEASE_TABLET | Freq: Two times a day (BID) | ORAL | Status: DC
  Administered 2013-02-24 – 2013-03-01 (×10): 40 mg via ORAL
  Filled 2013-02-24 (×11): qty 1

## 2013-02-24 MED ORDER — INSULIN DETEMIR 100 UNIT/ML ~~LOC~~ SOLN
15.0000 [IU] | Freq: Every day | SUBCUTANEOUS | Status: DC
  Administered 2013-02-24: 15 [IU] via SUBCUTANEOUS
  Filled 2013-02-24: qty 0.15

## 2013-02-24 MED ORDER — ALUM & MAG HYDROXIDE-SIMETH 200-200-20 MG/5ML PO SUSP: 30.0000 mL | Freq: Four times a day (QID) | ORAL | Status: DC | PRN

## 2013-02-24 MED ORDER — ACETAMINOPHEN 650 MG RE SUPP: 650.0000 mg | Freq: Four times a day (QID) | RECTAL | Status: DC | PRN

## 2013-02-24 MED ORDER — ROSUVASTATIN CALCIUM 10 MG PO TABS
10.0000 mg | ORAL_TABLET | Freq: Every day | ORAL | Status: DC
  Administered 2013-02-25 – 2013-03-01 (×5): 10 mg via ORAL
  Filled 2013-02-24 (×6): qty 1

## 2013-02-24 MED ORDER — DEXTROSE 5 % IV SOLN
500.0000 mg | Freq: Once | INTRAVENOUS | Status: AC
  Administered 2013-02-24: 500 mg via INTRAVENOUS

## 2013-02-24 MED ORDER — INSULIN ASPART 100 UNIT/ML ~~LOC~~ SOLN
0.0000 [IU] | Freq: Every day | SUBCUTANEOUS | Status: DC
  Administered 2013-02-24: 4 [IU] via SUBCUTANEOUS
  Administered 2013-02-25: 2 [IU] via SUBCUTANEOUS
  Administered 2013-02-27: 3 [IU] via SUBCUTANEOUS

## 2013-02-24 MED ORDER — POTASSIUM CHLORIDE IN NACL 20-0.9 MEQ/L-% IV SOLN
INTRAVENOUS | Status: DC
  Administered 2013-02-24: 1000 mL via INTRAVENOUS
  Administered 2013-02-25 – 2013-02-28 (×5): via INTRAVENOUS
  Filled 2013-02-24 (×7): qty 1000

## 2013-02-24 MED ORDER — ALBUTEROL SULFATE (5 MG/ML) 0.5% IN NEBU
2.5000 mg | INHALATION_SOLUTION | RESPIRATORY_TRACT | Status: DC
  Administered 2013-02-24 – 2013-02-25 (×7): 2.5 mg via RESPIRATORY_TRACT
  Filled 2013-02-24 (×8): qty 0.5

## 2013-02-24 MED ORDER — BUDESONIDE-FORMOTEROL FUMARATE 160-4.5 MCG/ACT IN AERO
2.0000 | INHALATION_SPRAY | Freq: Two times a day (BID) | RESPIRATORY_TRACT | Status: DC
  Administered 2013-02-24 – 2013-03-01 (×10): 2 via RESPIRATORY_TRACT
  Filled 2013-02-24: qty 6

## 2013-02-24 MED ORDER — METHYLPREDNISOLONE SODIUM SUCC 125 MG IJ SOLR
60.0000 mg | Freq: Four times a day (QID) | INTRAMUSCULAR | Status: DC
  Administered 2013-02-24 – 2013-02-25 (×4): 60 mg via INTRAVENOUS
  Administered 2013-02-25: 20:00:00 via INTRAVENOUS
  Administered 2013-02-25 – 2013-02-26 (×3): 60 mg via INTRAVENOUS
  Filled 2013-02-24 (×13): qty 0.96

## 2013-02-24 MED ORDER — ACETYLCYSTEINE 20 % IN SOLN
4.0000 mL | Freq: Three times a day (TID) | RESPIRATORY_TRACT | Status: AC
  Administered 2013-02-24 – 2013-02-26 (×6): 4 mL via RESPIRATORY_TRACT
  Filled 2013-02-24 (×6): qty 4

## 2013-02-24 MED ORDER — ACETAMINOPHEN 325 MG PO TABS: 650.0000 mg | ORAL_TABLET | Freq: Four times a day (QID) | ORAL | Status: DC | PRN

## 2013-02-24 MED ORDER — POLYSACCHARIDE IRON COMPLEX 150 MG PO CAPS
150.0000 mg | ORAL_CAPSULE | Freq: Every day | ORAL | Status: DC
  Administered 2013-02-24 – 2013-03-01 (×6): 150 mg via ORAL
  Filled 2013-02-24 (×7): qty 1

## 2013-02-24 MED ORDER — DILTIAZEM HCL ER 60 MG PO CP12
60.0000 mg | ORAL_CAPSULE | Freq: Two times a day (BID) | ORAL | Status: DC
  Administered 2013-02-24 – 2013-03-01 (×10): 60 mg via ORAL
  Filled 2013-02-24 (×11): qty 1

## 2013-02-24 MED ORDER — CETAPHIL EX CREA: 1.0000 "application " | TOPICAL_CREAM | CUTANEOUS | Status: DC | PRN

## 2013-02-24 MED ORDER — ASPIRIN 81 MG PO CHEW
81.0000 mg | CHEWABLE_TABLET | Freq: Every day | ORAL | Status: DC
  Administered 2013-02-24 – 2013-03-01 (×6): 81 mg via ORAL
  Filled 2013-02-24 (×6): qty 1

## 2013-02-24 MED ORDER — TIOTROPIUM BROMIDE MONOHYDRATE 18 MCG IN CAPS
18.0000 ug | ORAL_CAPSULE | Freq: Every day | RESPIRATORY_TRACT | Status: DC
  Administered 2013-02-24 – 2013-03-01 (×5): 18 ug via RESPIRATORY_TRACT
  Filled 2013-02-24: qty 5

## 2013-02-24 MED ORDER — INSULIN ASPART 100 UNIT/ML ~~LOC~~ SOLN
0.0000 [IU] | Freq: Three times a day (TID) | SUBCUTANEOUS | Status: DC
  Administered 2013-02-24: 11 [IU] via SUBCUTANEOUS
  Administered 2013-02-25: 7 [IU] via SUBCUTANEOUS
  Administered 2013-02-25 – 2013-02-26 (×3): 15 [IU] via SUBCUTANEOUS
  Administered 2013-02-26: 3 [IU] via SUBCUTANEOUS
  Administered 2013-02-26: 11 [IU] via SUBCUTANEOUS
  Administered 2013-02-27: 4 [IU] via SUBCUTANEOUS
  Administered 2013-02-27: 3 [IU] via SUBCUTANEOUS
  Administered 2013-02-27 – 2013-02-28 (×3): 4 [IU] via SUBCUTANEOUS
  Administered 2013-02-28: 11 [IU] via SUBCUTANEOUS

## 2013-02-24 MED ORDER — CLOPIDOGREL BISULFATE 75 MG PO TABS
75.0000 mg | ORAL_TABLET | Freq: Every day | ORAL | Status: DC
  Administered 2013-02-24 – 2013-03-01 (×6): 75 mg via ORAL
  Filled 2013-02-24 (×7): qty 1

## 2013-02-24 MED ORDER — CETAPHIL MOISTURIZING EX LOTN
TOPICAL_LOTION | CUTANEOUS | Status: DC | PRN
  Filled 2013-02-24: qty 473

## 2013-02-24 MED ORDER — METHYLPREDNISOLONE SODIUM SUCC 125 MG IJ SOLR
125.0000 mg | Freq: Once | INTRAMUSCULAR | Status: AC
  Administered 2013-02-24: 125 mg via INTRAVENOUS
  Filled 2013-02-24: qty 2

## 2013-02-24 MED ORDER — SODIUM CHLORIDE 0.9 % IJ SOLN
3.0000 mL | Freq: Two times a day (BID) | INTRAMUSCULAR | Status: DC
  Administered 2013-02-24: 14:00:00 via INTRAVENOUS
  Administered 2013-02-25 – 2013-02-28 (×3): 3 mL via INTRAVENOUS

## 2013-02-24 MED ORDER — DARIFENACIN HYDROBROMIDE ER 7.5 MG PO TB24
7.5000 mg | ORAL_TABLET | Freq: Every day | ORAL | Status: DC
  Administered 2013-02-24 – 2013-03-01 (×6): 7.5 mg via ORAL
  Filled 2013-02-24 (×6): qty 1

## 2013-02-24 MED ORDER — SODIUM CHLORIDE 0.9 % IV SOLN
Freq: Once | INTRAVENOUS | Status: AC
  Administered 2013-02-24: 12:00:00 via INTRAVENOUS

## 2013-02-24 MED ORDER — ATORVASTATIN CALCIUM 20 MG PO TABS
20.0000 mg | ORAL_TABLET | Freq: Every evening | ORAL | Status: DC
  Administered 2013-02-24: 20 mg via ORAL
  Filled 2013-02-24: qty 1

## 2013-02-24 MED ORDER — ENOXAPARIN SODIUM 30 MG/0.3ML ~~LOC~~ SOLN
30.0000 mg | SUBCUTANEOUS | Status: DC
Start: 2013-02-24 — End: 2013-02-25
  Administered 2013-02-24: 30 mg via SUBCUTANEOUS
  Filled 2013-02-24 (×2): qty 0.3

## 2013-02-24 MED ORDER — ALLOPURINOL 100 MG PO TABS
100.0000 mg | ORAL_TABLET | Freq: Every day | ORAL | Status: DC
  Administered 2013-02-24 – 2013-03-01 (×6): 100 mg via ORAL
  Filled 2013-02-24 (×6): qty 1

## 2013-02-24 MED ORDER — DEXTROSE 5 % IV SOLN
500.0000 mg | INTRAVENOUS | Status: DC
  Administered 2013-02-25 – 2013-02-26 (×2): 500 mg via INTRAVENOUS
  Filled 2013-02-24 (×3): qty 500

## 2013-02-24 MED ORDER — NITROGLYCERIN 0.4 MG SL SUBL: 0.4000 mg | SUBLINGUAL_TABLET | SUBLINGUAL | Status: DC | PRN

## 2013-02-24 MED ORDER — VANCOMYCIN HCL IN DEXTROSE 1-5 GM/200ML-% IV SOLN
1000.0000 mg | INTRAVENOUS | Status: DC
  Administered 2013-02-24: 1000 mg via INTRAVENOUS
  Filled 2013-02-24 (×2): qty 200

## 2013-02-24 MED ORDER — BISACODYL 5 MG PO TBEC: 5.0000 mg | DELAYED_RELEASE_TABLET | Freq: Every day | ORAL | Status: DC | PRN

## 2013-02-24 NOTE — Progress Notes (Signed)
Pt has already received Lipitor at 1800. Crestor was not given tonight.

## 2013-02-24 NOTE — ED Provider Notes (Signed)
CSN: [id number]     Arrival date & time    History   First MD Initiated Contact with Patient 02/24/13 0910     Chief Complaint  Patient presents with  . Weakness  . Fever    HPI Patient presents with concerns of fatigue, somnolence, fever. Symptoms have been progressive for the past 2 days. Patient initially had right shoulder pain, this has.  Prior to my evaluation. Patient denies confusion, disorientation, nausea, vomiting, diarrhea, bowel or bladder changes. Patient was hospitalized one month ago for COPD exacerbation.  He states that since discharge he has been generally well.  He continues to take 2.5 mg prednisone daily.  Past Medical History  Diagnosis Date  . TIA (transient ischemic attack) 12/2006    Dr. Leonie Man  . OSA (obstructive sleep apnea)   . COPD (chronic obstructive pulmonary disease)   . Rhinitis   . Hypertension   . DM (diabetes mellitus)   . DJD (degenerative joint disease)   . Dyslipidemia   . Hiatal hernia   . Hemorrhoids   . Diverticulosis   . Overactive bladder   . Shingles   . Asthma   . Seasonal allergies   . GERD (gastroesophageal reflux disease)   . Anemia   . Bilateral sensorineural hearing loss   . Osteoarthritis   . Benign prostatic hypertrophy   . CAD (coronary artery disease) 07/2003    a.  3.0 x 28 mm Taxus DES in the mid RCA in 2005  . Stroke    Past Surgical History  Procedure Laterality Date  . L5 laminectomy  2004    Dr. Sherwood Gambler  . Right total knee  2006    Dr. Maureen Ralphs  . Coronary angioplasty with stent placement  07/2003    Est. EF of 65% with stent to the RCA, by Dr. Acie Fredrickson  . Inner ear surgery     Family History  Problem Relation Age of Onset  . Lung disease Father   . Lung disease Brother   . Asthma Father   . Asthma Brother   . Sudden death Mother     unknown causes  . Sudden death Brother 68    unknown causes  . Sudden death Sister     unknown causes  . Sudden death Sister     unknown causes   History   Substance Use Topics  . Smoking status: Former Smoker -- 1.00 packs/day for 43 years    Types: Cigarettes    Quit date: 05/02/1988  . Smokeless tobacco: Never Used  . Alcohol Use: Yes     Comment: RARE    Review of Systems  Constitutional:       Per HPI, otherwise negative  HENT:       Per HPI, otherwise negative  Respiratory:       Per HPI, otherwise negative  Cardiovascular:       Per HPI, otherwise negative  Gastrointestinal: Negative for vomiting.  Endocrine:       Negative aside from HPI  Genitourinary:       Neg aside from HPI   Musculoskeletal:       Per HPI, otherwise negative  Skin: Negative.   Neurological: Negative for syncope.    Allergies  Ciprofloxacin hcl and Sulfonamide derivatives  Home Medications   Current Outpatient Rx  Name  Route  Sig  Dispense  Refill  . albuterol (PROVENTIL) (2.5 MG/3ML) 0.083% nebulizer solution   Nebulization   Take 0.83 mg by nebulization every  4 (four) hours as needed.          Marland Kitchen allopurinol (ZYLOPRIM) 100 MG tablet   Oral   Take 100 mg by mouth daily.          Marland Kitchen aspirin 81 MG tablet   Oral   Take 81 mg by mouth daily.          Marland Kitchen atorvastatin (LIPITOR) 20 MG tablet   Oral   Take 20 mg by mouth daily.           . budesonide-formoterol (SYMBICORT) 160-4.5 MCG/ACT inhaler   Inhalation   Inhale 2 puffs into the lungs 2 (two) times daily.           . cetaphil (CETAPHIL) cream   Topical   Apply 1 application topically as needed (itching). As needed for itching         . clopidogrel (PLAVIX) 75 MG tablet      TAKE 1 TABLET DAILY (NEED APPOINTMENT FOR MORE REFILLS)   90 tablet   1   . diltiazem (DILTZAC) 180 MG 24 hr capsule   Oral   Take 1 capsule (180 mg total) by mouth daily.   30 capsule   11     INCREASED DOSE, MAY HAVE 90 DAY SUPPLY   . furosemide (LASIX) 20 MG tablet   Oral   Take 20 mg by mouth daily.           Marland Kitchen HYDROcodone-acetaminophen (VICODIN) 5-500 MG per tablet      As  needed for pain          . hydrOXYzine (ATARAX/VISTARIL) 25 MG tablet   Oral   Take 25 mg by mouth as needed for itching or anxiety.          . iron polysaccharides (NIFEREX) 150 MG capsule   Oral   Take 150 mg by mouth daily.          Marland Kitchen loratadine (CLARITIN) 10 MG tablet   Oral   Take 10 mg by mouth daily.           . montelukast (SINGULAIR) 10 MG tablet   Oral   Take 10 mg by mouth at bedtime.           . nitroGLYCERIN (NITROSTAT) 0.4 MG SL tablet   Sublingual   Place 0.4 mg under the tongue every 5 (five) minutes as needed.           . pantoprazole (PROTONIX) 40 MG tablet   Oral   Take 40 mg by mouth 2 (two) times daily.          . predniSONE (DELTASONE) 5 MG tablet   Oral   Take 2 tablets (10 mg total) by mouth daily. Actually, you are to take a prednisone taper as outlined on the written prescription that I have given to you. Take 40 mg daily for 2 days, then take 30 mg daily for 2 days, then take 20 mg daily for two days, then take 10 mg daily for two days then stop   20 tablet   0   . roflumilast (DALIRESP) 500 MCG TABS tablet   Oral   Take 500 mcg by mouth daily.           . sitaGLIPtan-metformin (JANUMET) 50-500 MG per tablet   Oral   Take 1 tablet by mouth 2 (two) times daily with a meal.           . solifenacin (VESICARE) 5 MG  tablet   Oral   Take 10 mg by mouth daily.         . Tamsulosin HCl (FLOMAX) 0.4 MG CAPS   Oral   Take 0.4 mg by mouth daily after supper.           . telmisartan (MICARDIS) 80 MG tablet   Oral   Take 80 mg by mouth daily.           Marland Kitchen tiotropium (SPIRIVA) 18 MCG inhalation capsule   Inhalation   Place 18 mcg into inhaler and inhale daily.            SpO2 94% Physical Exam  Nursing note and vitals reviewed. Constitutional: He is oriented to person, place, and time. He appears well-developed. He appears ill. No distress.  HENT:  Head: Normocephalic and atraumatic.  Eyes: Conjunctivae and EOM are  normal.  Cardiovascular: Normal rate and regular rhythm.   Pulmonary/Chest: Effort normal. No stridor. No respiratory distress.  Abdominal: He exhibits no distension.  Musculoskeletal: He exhibits no edema.  Neurological: He is alert and oriented to person, place, and time.  Skin: Skin is warm and dry.  Poor skin turgor  Psychiatric: He has a normal mood and affect.    ED Course  Procedures (including critical care time) Labs Review Labs Reviewed - No data to display Imaging Review No results found.  O2- 93%ra, abn  Cardiac: 70 sr, nml    EKG Interpretation     Ventricular Rate:  94 PR Interval:  179 QRS Duration: 84 QT Interval:  356 QTC Calculation: 445 R Axis:   63 Text Interpretation:  Sinus rhythm Artifact Normal ECG           Patient hypotensive on initial exam - no Hx of CHF.  IVF started empirically.  11:02 AM Patient and family aware of all results.  We discussed the x-ray findings.  The patient remains comfortable-appearing.  With his hypertension, no evidence of pneumonia, COPD, he will be admitted. MDM  This patient presents with an generalized complaints, dyspnea, concern of fever.  On exam he is awake and alert, but hypotensive, hypoxic on initial exam. Patient improved with fluids, steroids.  Patient's evaluation is notable for demonstration of right lower lobe pneumonia.  Given his hypoxia, hypotension, he was admitted for further evaluation and management.    Carmin Muskrat, MD 02/24/13 1105

## 2013-02-24 NOTE — H&P (Addendum)
[name] is an 79 y.o. male.   Chief Complaint: weak and having trouble breathing HPI:  Patient is an 79 year old man of [place] descent who is well-known to me. I serve as his primary care physician. He has severe emphysema and that requires multiple inhaled medications as well as prednisone at 2.5-5 mg every day. He was in his usual state of fairly good health until yesterday when he developed a fever of greater than 100F. His son is a physician and assessed him at his home and indicated that he was not with significant dyspnea at that time. We started him on Levaquin 500 mg by mouth daily and his prednisone dose was increased to 10 mg daily. Overnight his condition worsened and in the morning he had a temperature of greater than [id number], with systolic blood pressure 60 when sitting up, and dizziness that precluded walking, so EMS was called and he was transported to the emergency room for evaluation. In the emergency room his workup was most significant for a chest x-ray with right lower lobe pneumonia, moderate leukocytosis, and initial hypotension that improved with high dose solumedrol and IV fluids. Currently, he has pursed lip breathing with sitting at 30 degree elevation of the head of bed, and has an occasional nonproductive without pleuritic chest discomfort. He denies recent angina sxs, abdominal pain, nausea, or painful urination.    Past Medical History  Diagnosis Date  . TIA (transient ischemic attack) 12/2006    Dr. Leonie Man  . OSA (obstructive sleep apnea)   . COPD (chronic obstructive pulmonary disease)   . Rhinitis   . Hypertension   . DM (diabetes mellitus)   . DJD (degenerative joint disease)   . Dyslipidemia   . Hiatal hernia   . Hemorrhoids   . Diverticulosis   . Overactive bladder   . Shingles   . Asthma   . Seasonal allergies   . GERD (gastroesophageal reflux disease)   . Anemia   . Bilateral sensorineural hearing loss   . Osteoarthritis   . Benign prostatic  hypertrophy   . CAD (coronary artery disease) 07/2003    a.  3.0 x 28 mm Taxus DES in the mid RCA in 2005  . Stroke     Medications Prior to Admission  Medication Sig Dispense Refill  . albuterol (PROVENTIL) (2.5 MG/3ML) 0.083% nebulizer solution Take 0.83 mg by nebulization every 4 (four) hours as needed.       Marland Kitchen allopurinol (ZYLOPRIM) 100 MG tablet Take 100 mg by mouth daily.       Marland Kitchen aspirin 81 MG tablet Take 81 mg by mouth daily.       Marland Kitchen atorvastatin (LIPITOR) 20 MG tablet Take 20 mg by mouth daily.        . budesonide-formoterol (SYMBICORT) 160-4.5 MCG/ACT inhaler Inhale 2 puffs into the lungs 2 (two) times daily.        . cetaphil (CETAPHIL) cream Apply 1 application topically as needed (itching). As needed for itching      . clopidogrel (PLAVIX) 75 MG tablet Take 75 mg by mouth daily.      Marland Kitchen diltiazem (TIAZAC) 120 MG 24 hr capsule Take 120 mg by mouth daily.      . furosemide (LASIX) 20 MG tablet Take 20 mg by mouth daily.        Marland Kitchen glimepiride (AMARYL) 2 MG tablet Take 2 mg by mouth daily before breakfast.      . HYDROcodone-acetaminophen (VICODIN) 5-500 MG per tablet  As needed for pain       . iron polysaccharides (NIFEREX) 150 MG capsule Take 150 mg by mouth daily.       Marland Kitchen levofloxacin (LEVAQUIN) 500 MG tablet Take 500 mg by mouth daily. 10 days      . montelukast (SINGULAIR) 10 MG tablet Take 10 mg by mouth at bedtime.        . nitroGLYCERIN (NITROSTAT) 0.4 MG SL tablet Place 0.4 mg under the tongue every 5 (five) minutes as needed.        . pantoprazole (PROTONIX) 40 MG tablet Take 40 mg by mouth 2 (two) times daily.       . predniSONE (DELTASONE) 5 MG tablet Take 2.5 mg by mouth daily.      . roflumilast (DALIRESP) 500 MCG TABS tablet Take 500 mcg by mouth daily.        . sitaGLIPtan-metformin (JANUMET) 50-500 MG per tablet Take 1 tablet by mouth 2 (two) times daily with a meal.        . solifenacin (VESICARE) 5 MG tablet Take 10 mg by mouth daily.      . Tamsulosin HCl  (FLOMAX) 0.4 MG CAPS Take 0.4 mg by mouth daily after supper.        . telmisartan (MICARDIS) 80 MG tablet Take 80 mg by mouth daily.       Marland Kitchen tiotropium (SPIRIVA) 18 MCG inhalation capsule Place 18 mcg into inhaler and inhale daily.        . vitamin C (ASCORBIC ACID) 500 MG tablet Take 500 mg by mouth daily.        ADDITIONAL HOME MEDICATIONS: no additional home meds  PHYSICIANS INVOLVED IN CARE: Leanna Battles (PCP), Liam Rogers (card),  Gaynelle Arabian (ortho), Jetta Lout (nsurg)  Past Surgical History  Procedure Laterality Date  . L5 laminectomy  2004    Dr. Sherwood Gambler  . Right total knee  2006    Dr. Maureen Ralphs  . Coronary angioplasty with stent placement  07/2003    Est. EF of 65% with stent to the RCA, by Dr. Acie Fredrickson  . Inner ear surgery    . Back surgery    . Joint replacement      Family History  Problem Relation Age of Onset  . Lung disease Father   . Lung disease Brother   . Asthma Father   . Asthma Brother   . Sudden death Mother     unknown causes  . Sudden death Brother 1    unknown causes  . Sudden death Sister     unknown causes  . Sudden death Sister     unknown causes     Social History:  reports that he quit smoking about 24 years ago. His smoking use included Cigarettes. He has a 43 pack-year smoking history. He has never used smokeless tobacco. He reports that he drinks alcohol. He reports that he does not use illicit drugs.  Allergies:  Allergies  Allergen Reactions  . Ciprofloxacin Hcl Other (See Comments)    Unknown   . Penicillins     unknown  . Sulfonamide Derivatives     Unknown      ROS: anemia, diabetes, emphysema, heart attack, high blood pressure, kidney disease, shortness of breath and stroke  PHYSICAL EXAM: Blood pressure 94/40, pulse 89, temperature 98.6 F (37 C), temperature source Oral, resp. rate 19, SpO2 97.00%. In general, he is an elderly man who had pursed lip breathing while sitting at 30 elevation head  of bed, he had  moderately decreased hearing bilaterally, HEENT exam was within normal limits, neck was supple without jugular venous distention or carotid bruit, chest had crackles throughout the posterior upper to thirds of the right hemithorax, abdomen had normal bowel sounds and no hepatosplenomegaly or tenderness, extremities were without cyanosis, clubbing, or edema. He was alert and well oriented and was able to give a good history. He could move all extremities well.  Results for orders placed during the hospital encounter of 02/24/13 (from the past 48 hour(s))  CBC WITH DIFFERENTIAL     Status: Abnormal   Collection Time    02/24/13 10:10 AM      Result Value Range   WBC 18.7 (*) 4.0 - 10.5 K/uL   RBC 3.70 (*) 4.22 - 5.81 MIL/uL   Hemoglobin 10.7 (*) 13.0 - 17.0 g/dL   HCT 31.3 (*) 39.0 - 52.0 %   MCV 84.6  78.0 - 100.0 fL   MCH 28.9  26.0 - 34.0 pg   MCHC 34.2  30.0 - 36.0 g/dL   RDW 14.1  11.5 - 15.5 %   Platelets 195  150 - 400 K/uL   Neutrophils Relative % 84 (*) 43 - 77 %   Lymphocytes Relative 7 (*) 12 - 46 %   Monocytes Relative 8  3 - 12 %   Eosinophils Relative 1  0 - 5 %   Basophils Relative 0  0 - 1 %   Neutro Abs 15.7 (*) 1.7 - 7.7 K/uL   Lymphs Abs 1.3  0.7 - 4.0 K/uL   Monocytes Absolute 1.5 (*) 0.1 - 1.0 K/uL   Eosinophils Absolute 0.2  0.0 - 0.7 K/uL   Basophils Absolute 0.0  0.0 - 0.1 K/uL   WBC Morphology INCREASED BANDS (>20% BANDS)    COMPREHENSIVE METABOLIC PANEL     Status: Abnormal   Collection Time    02/24/13 10:10 AM      Result Value Range   Sodium 134 (*) 135 - 145 mEq/L   Potassium 4.2  3.5 - 5.1 mEq/L   Chloride 101  96 - 112 mEq/L   CO2 24  19 - 32 mEq/L   Glucose, Bld 51 (*) 70 - 99 mg/dL   BUN 29 (*) 6 - 23 mg/dL   Creatinine, Ser 1.82 (*) 0.50 - 1.35 mg/dL   Calcium 8.5  8.4 - 10.5 mg/dL   Total Protein 4.7 (*) 6.0 - 8.3 g/dL   Albumin 2.4 (*) 3.5 - 5.2 g/dL   AST 10  0 - 37 U/L   ALT 10  0 - 53 U/L   Alkaline Phosphatase 61  39 - 117 U/L   Total  Bilirubin 0.5  0.3 - 1.2 mg/dL   GFR calc non Af Amer 33 (*) >90 mL/min   GFR calc Af Amer 38 (*) >90 mL/min   Comment: (NOTE)     The eGFR has been calculated using the CKD EPI equation.     This calculation has not been validated in all clinical situations.     eGFR's persistently <90 mL/min signify possible Chronic Kidney     Disease.  PRO B NATRIURETIC PEPTIDE     Status: Abnormal   Collection Time    02/24/13 10:10 AM      Result Value Range   Pro B Natriuretic peptide (BNP) 522.8 (*) 0 - 450 pg/mL  TROPONIN I     Status: None   Collection Time    02/24/13  10:10 AM      Result Value Range   Troponin I <0.30  <0.30 ng/mL   Comment:            Due to the release kinetics of cTnI,     a negative result within the first hours     of the onset of symptoms does not rule out     myocardial infarction with certainty.     If myocardial infarction is still suspected,     repeat the test at appropriate intervals.  POCT I-STAT TROPONIN I     Status: None   Collection Time    02/24/13 10:16 AM      Result Value Range   Troponin i, poc 0.00  0.00 - 0.08 ng/mL   Comment 3            Comment: Due to the release kinetics of cTnI,     a negative result within the first hours     of the onset of symptoms does not rule out     myocardial infarction with certainty.     If myocardial infarction is still suspected,     repeat the test at appropriate intervals.   Dg Chest 2 View  02/24/2013   CLINICAL DATA:  Shortness of breath and fever  EXAM: CHEST  2 VIEW  COMPARISON:  01/04/2013  FINDINGS: Heart size and vascular pattern are normal. Left lung is clear. There is airspace opacification in the right lower lobe with air bronchograms present.  IMPRESSION: Right lower lobe pneumonia   Electronically Signed   By: Skipper Cliche M.D.   On: 02/24/2013 10:50     Assessment/Plan #1 Community Acquired Pneumonia: He clearly has a right lower lobe pneumonia. It is concerning that he is not able to  raise any sputum, likely because of his severe emphysema. We will broaden his antibiotic coverage using a combination of Rocephin, Zithromax, and vancomycin, and in addition we'll add Mucomyst nebulizers to his frequent albuterol nebulizer treatments. Lastly, he will be continued on moderate dose IV corticosteroids for his emphysema as well as his presumed iatrogenic adrenal insufficiency.  #2 COPD and Acute Respiratory Failure: Severe and should improve with high-dose albuterol nebulizers, Spiriva, and moderate dose IV corticosteroids. #3 Diabetes Mellitus, type 2:  Clinically stable and his blood glucose levels will rise significantly, so he will be on insulin resistant sliding scale insulin as needed. #4 Coronary Artery Disease:  stable with no recent angina symptoms. We will restart Cardizem when his blood pressure improves.   Soniya Ashraf G 02/24/2013, 1:07 PM

## 2013-02-24 NOTE — ED Notes (Signed)
Bed: [id number] Expected date:  Expected time:  Means of arrival:  Comments: EMS-fever/weakness

## 2013-02-24 NOTE — ED Notes (Signed)
Per EMS pt comes from home c/o weakness and fever that started yesterday. Pt BP 86/44 and positive for orthostatic changes in BP.  Pt had Tylenol 1000mg  at 0800 today. Pt does have short term memory loss and at normal baseline.

## 2013-02-24 NOTE — Progress Notes (Signed)
ANTIBIOTIC CONSULT NOTE - INITIAL  Pharmacy Consult for Vancomycin Indication: treat for HCAP  Allergies  Allergen Reactions  . Ciprofloxacin Hcl Other (See Comments)    Unknown   . Penicillins     unknown  . Sulfonamide Derivatives     Unknown    Patient Measurements: Height: 5\' 6"  (167.6 cm) Weight: 143 lb (64.864 kg) IBW/kg (Calculated) : 63.8  Vital Signs: Temp: 97 F (36.1 C) (10/26 1312) Temp src: Oral (10/26 1312) BP: 108/67 mmHg (10/26 1312) Pulse Rate: 94 (10/26 1312) Intake/Output from previous day:   Intake/Output from this shift:    Labs:  Recent Labs  02/24/13 1010  WBC 18.7*  HGB 10.7*  PLT 195  CREATININE 1.82*   Estimated Creatinine Clearance: 28.7 ml/min (by C-G formula based on Cr of 1.82). No results found for this basename: VANCOTROUGH, VANCOPEAK, VANCORANDOM, GENTTROUGH, GENTPEAK, GENTRANDOM, TOBRATROUGH, TOBRAPEAK, TOBRARND, AMIKACINPEAK, AMIKACINTROU, AMIKACIN,  in the last 72 hours   Microbiology: No results found for this or any previous visit (from the past 720 hour(s)).  Medical History: Past Medical History  Diagnosis Date  . TIA (transient ischemic attack) 12/2006    Dr. Leonie Man  . OSA (obstructive sleep apnea)   . COPD (chronic obstructive pulmonary disease)   . Rhinitis   . Hypertension   . DM (diabetes mellitus)   . DJD (degenerative joint disease)   . Dyslipidemia   . Hiatal hernia   . Hemorrhoids   . Diverticulosis   . Overactive bladder   . Shingles   . Asthma   . Seasonal allergies   . GERD (gastroesophageal reflux disease)   . Anemia   . Bilateral sensorineural hearing loss   . Osteoarthritis   . Benign prostatic hypertrophy   . CAD (coronary artery disease) 07/2003    a.  3.0 x 28 mm Taxus DES in the mid RCA in 2005  . Stroke    Medications:  Anti-infectives   Start     Dose/Rate Route Frequency Ordered Stop   02/25/13 1200  azithromycin (ZITHROMAX) 500 mg in dextrose 5 % 250 mL IVPB     500 mg 250 mL/hr  over 60 Minutes Intravenous Every 24 hours 02/24/13 1314     02/25/13 1200  cefTRIAXone (ROCEPHIN) 1 g in dextrose 5 % 50 mL IVPB     1 g 100 mL/hr over 30 Minutes Intravenous Every 24 hours 02/24/13 1314     02/24/13 1115  azithromycin (ZITHROMAX) 500 mg in dextrose 5 % 250 mL IVPB     500 mg 250 mL/hr over 60 Minutes Intravenous  Once 02/24/13 1102 02/24/13 1354   02/24/13 1115  cefTRIAXone (ROCEPHIN) 1 g in dextrose 5 % 50 mL IVPB     1 g 100 mL/hr over 30 Minutes Intravenous  Once 02/24/13 1102 02/24/13 1203     Assessment: 5 yoM admit with PNA, seen as outpt 10/25, started on Levaquin and increased Prednisone. Worsening sx, to ED per EMS.Previous admit 9/5-9/6 for chest pain. Treat with Azithromycin, Rocephin, add Vancomycin; rule out HCAP  Day 1 Azithromycin, Rocephin, Vancomycin  SCr elevated 1.82, Cl ~ 32 normalized  Sputum culture ordered  Goal of Therapy:  Vancomycin trough level 15-20 mcg/ml  Plan:   Vancomycin 1gm q24 hr  Azithromycin, Rocephin are not renally excreted, no adjustment necessary  Narrow antibiotic treatment when able  Minda Ditto PharmD Pager [phone] 02/24/2013, 2:06 PM

## 2013-02-25 LAB — GLUCOSE, CAPILLARY
Glucose-Capillary: 242 mg/dL — ABNORMAL HIGH (ref 70–99)
Glucose-Capillary: 411 mg/dL — ABNORMAL HIGH (ref 70–99)
Glucose-Capillary: 329 mg/dL — ABNORMAL HIGH (ref 70–99)
Glucose-Capillary: 251 mg/dL — ABNORMAL HIGH (ref 70–99)

## 2013-02-25 LAB — CBC
HCT: 37.1 % — ABNORMAL LOW (ref 39.0–52.0)
Hemoglobin: 12.6 g/dL — ABNORMAL LOW (ref 13.0–17.0)
MCH: 28.6 pg (ref 26.0–34.0)
MCHC: 34 g/dL (ref 30.0–36.0)
MCV: 84.1 fL (ref 78.0–100.0)
RDW: 14.3 % (ref 11.5–15.5)
WBC: 31.9 10*3/uL — ABNORMAL HIGH (ref 4.0–10.5)

## 2013-02-25 LAB — COMPREHENSIVE METABOLIC PANEL
ALT: 14 U/L (ref 0–53)
AST: 16 U/L (ref 0–37)
Alkaline Phosphatase: 75 U/L (ref 39–117)
BUN: 27 mg/dL — ABNORMAL HIGH (ref 6–23)
Calcium: 9.4 mg/dL (ref 8.4–10.5)
Chloride: 103 mEq/L (ref 96–112)
Glucose, Bld: 275 mg/dL — ABNORMAL HIGH (ref 70–99)
Potassium: 4.4 mEq/L (ref 3.5–5.1)
Sodium: 136 mEq/L (ref 135–145)
Total Protein: 6 g/dL (ref 6.0–8.3)

## 2013-02-25 MED ORDER — ALBUTEROL SULFATE (5 MG/ML) 0.5% IN NEBU
2.5000 mg | INHALATION_SOLUTION | Freq: Three times a day (TID) | RESPIRATORY_TRACT | Status: DC
  Administered 2013-02-26 – 2013-03-01 (×10): 2.5 mg via RESPIRATORY_TRACT
  Filled 2013-02-25 (×11): qty 0.5

## 2013-02-25 MED ORDER — ENOXAPARIN SODIUM 40 MG/0.4ML ~~LOC~~ SOLN
40.0000 mg | SUBCUTANEOUS | Status: DC
  Administered 2013-02-25 – 2013-02-28 (×4): 40 mg via SUBCUTANEOUS
  Filled 2013-02-25 (×5): qty 0.4

## 2013-02-25 MED ORDER — ALBUTEROL SULFATE (5 MG/ML) 0.5% IN NEBU
2.5000 mg | INHALATION_SOLUTION | RESPIRATORY_TRACT | Status: DC | PRN
  Administered 2013-02-27 (×2): 2.5 mg via RESPIRATORY_TRACT
  Filled 2013-02-25 (×2): qty 0.5

## 2013-02-25 MED ORDER — INSULIN DETEMIR 100 UNIT/ML ~~LOC~~ SOLN
15.0000 [IU] | Freq: Two times a day (BID) | SUBCUTANEOUS | Status: DC
  Administered 2013-02-25 – 2013-02-26 (×4): 15 [IU] via SUBCUTANEOUS
  Filled 2013-02-25 (×5): qty 0.15

## 2013-02-25 MED ORDER — VANCOMYCIN HCL 500 MG IV SOLR
500.0000 mg | Freq: Two times a day (BID) | INTRAVENOUS | Status: DC
  Administered 2013-02-25 – 2013-02-28 (×6): 500 mg via INTRAVENOUS
  Filled 2013-02-25 (×7): qty 500

## 2013-02-25 NOTE — Progress Notes (Signed)
Pt does not wish to wear CPAP mask tonight. Encouraged pt to call RT if needing any assistance placing mask on. No distress noted at this time. Family at bedside

## 2013-02-25 NOTE — Progress Notes (Signed)
ANTIBIOTIC CONSULT NOTE - Follow-up  Pharmacy Consult for Vancomycin Indication: Pneumonia  Allergies  Allergen Reactions  . Ciprofloxacin Hcl Other (See Comments)    Unknown   . Penicillins     unknown  . Sulfonamide Derivatives     Unknown    Patient Measurements: Height: 5\' 6"  (167.6 cm) Weight: 143 lb (64.864 kg) IBW/kg (Calculated) : 63.8  Vital Signs: Temp: 98.3 F (36.8 C) (10/27 0600) Temp src: Oral (10/27 0600) BP: 121/76 mmHg (10/27 0600) Pulse Rate: 96 (10/27 0600) Intake/Output from previous day: 10/26 0701 - 10/27 0700 In: 1456.3 [P.O.:660; I.V.:596.3; IV Piggyback:200] Out: 520 [Urine:520] Intake/Output from this shift: Total I/O In: 3 [I.V.:3] Out: -   Labs:  Recent Labs  02/24/13 1010 02/25/13 0545  WBC 18.7* 31.9*  HGB 10.7* 12.6*  PLT 195 190  CREATININE 1.82* 1.16   Estimated Creatinine Clearance: 45.1 ml/min (by C-G formula based on Cr of 1.16). No results found for this basename: VANCOTROUGH, VANCOPEAK, VANCORANDOM, GENTTROUGH, GENTPEAK, GENTRANDOM, TOBRATROUGH, TOBRAPEAK, TOBRARND, AMIKACINPEAK, AMIKACINTROU, AMIKACIN,  in the last 72 hours   Microbiology: No results found for this or any previous visit (from the past 720 hour(s)).  Medical History: Past Medical History  Diagnosis Date  . TIA (transient ischemic attack) 12/2006    Dr. Leonie Man  . OSA (obstructive sleep apnea)   . COPD (chronic obstructive pulmonary disease)   . Rhinitis   . Hypertension   . DM (diabetes mellitus)   . DJD (degenerative joint disease)   . Dyslipidemia   . Hiatal hernia   . Hemorrhoids   . Diverticulosis   . Overactive bladder   . Shingles   . Asthma   . Seasonal allergies   . GERD (gastroesophageal reflux disease)   . Anemia   . Bilateral sensorineural hearing loss   . Osteoarthritis   . Benign prostatic hypertrophy   . CAD (coronary artery disease) 07/2003    a.  3.0 x 28 mm Taxus DES in the mid RCA in 2005  . Stroke    Medications:   Anti-infectives   Start     Dose/Rate Route Frequency Ordered Stop   02/25/13 1200  azithromycin (ZITHROMAX) 500 mg in dextrose 5 % 250 mL IVPB     500 mg 250 mL/hr over 60 Minutes Intravenous Every 24 hours 02/24/13 1314     02/25/13 1200  cefTRIAXone (ROCEPHIN) 1 g in dextrose 5 % 50 mL IVPB     1 g 100 mL/hr over 30 Minutes Intravenous Every 24 hours 02/24/13 1314     02/25/13 1200  vancomycin (VANCOCIN) 500 mg in sodium chloride 0.9 % 100 mL IVPB     500 mg 100 mL/hr over 60 Minutes Intravenous Every 12 hours 02/25/13 1021     02/24/13 1500  vancomycin (VANCOCIN) IVPB 1000 mg/200 mL premix  Status:  Discontinued     1,000 mg 200 mL/hr over 60 Minutes Intravenous Every 24 hours 02/24/13 1405 02/25/13 1021   02/24/13 1115  azithromycin (ZITHROMAX) 500 mg in dextrose 5 % 250 mL IVPB     500 mg 250 mL/hr over 60 Minutes Intravenous  Once 02/24/13 1102 02/24/13 1354   02/24/13 1115  cefTRIAXone (ROCEPHIN) 1 g in dextrose 5 % 50 mL IVPB     1 g 100 mL/hr over 30 Minutes Intravenous  Once 02/24/13 1102 02/24/13 1203     Assessment: 79 y/o M on D#2 empiric ceftriaxone 1 gram IV q24h / azithromycin 500 mg IV q24h /  vancomycin 1 gram IV q24h for pneumonia. SCr has improved (1.16,  CrCl ~ 45 mL/min)  Goal of Therapy:  Vancomycin trough level 15-20 mcg/ml Eradication of infection  Plan:   Change vancomycin to 500 mg IV q12h per nomogram  Continue present azithromycin and ceftriaxone dosages.  Await culture results for potential de-escalation of therapy.  Clayburn Pert, PharmD, BCPS Pager: [phone] 02/25/2013  10:27 AM

## 2013-02-25 NOTE — Progress Notes (Signed)
Pt will place on mask when ready. Pt encouraged to call RT if needing any assistance with placing CPAP mask on. No distress noted. Family at bedside.

## 2013-02-25 NOTE — Progress Notes (Signed)
Inpatient Diabetes Program Recommendations  AACE/ADA: New Consensus Statement on Inpatient Glycemic Control (2013)  Target Ranges:  Prepandial:   less than 140 mg/dL      Peak postprandial:   less than 180 mg/dL (1-2 hours)      Critically ill patients:  140 - 180 mg/dL   Reason for Visit: Hyperglycemia  Results for EDELL, CLEMENTI (MRN [id number]) as of 02/25/2013 12:39  Ref. Range 02/24/2013 16:43 02/24/2013 22:38 02/25/2013 07:37 02/25/2013 11:27  Glucose-Capillary Latest Range: 70-99 mg/dL 270 (H) 319 (H) 242 (H) 411 (H)  Steroid-induced hyperglycemia. Pt eating well.  Inpatient Diabetes Program Recommendations Insulin - Meal Coverage: Increase Novolog to 4 units tidwc for meal coverage insulin if pt eats >50% meal HgbA1C: Check HgbA1C to assess glycemic control prior to hospitalization  Will continue to follow.  Thank you. Lorenda Peck, RD, LDN, CDE Inpatient Diabetes Coordinator [phone]

## 2013-02-25 NOTE — Progress Notes (Signed)
Subjective: Dyspnea is much improved, no longer lightheaded, appetite is good  Objective: Vital signs in last 24 hours: Temp:  [97 F (36.1 C)-98.6 F (37 C)] 98.3 F (36.8 C) (10/27 0600) Pulse Rate:  [76-100] 96 (10/27 0600) Resp:  [16-19] 18 (10/27 0600) BP: (81-125)/(40-76) 121/76 mmHg (10/27 0600) SpO2:  [92 %-100 %] 96 % (10/27 0717) FiO2 (%):  [2 %] 2 % (10/26 1400) Weight:  [64.864 kg (143 lb)] 64.864 kg (143 lb) (10/26 1312) Weight change:    Intake/Output from previous day: 10/26 0701 - 10/27 0700 In: 1456.3 [P.O.:660; I.V.:596.3; IV Piggyback:200] Out: 520 [Urine:520]   General appearance: alert, cooperative, mild distress and has frequent cough Resp: crackles lower 1/2 right hemithorax, improved Cardio: regular rate and rhythm, S1, S2 normal, no murmur, click, rub or gallop Extremities: bilateral trace leg edema  Lab Results:  Recent Labs  02/24/13 1010 02/25/13 0545  WBC 18.7* 31.9*  HGB 10.7* 12.6*  HCT 31.3* 37.1*  PLT 195 190   BMET  Recent Labs  02/24/13 1010 02/25/13 0545  NA 134* 136  K 4.2 4.4  CL 101 103  CO2 24 24  GLUCOSE 51* 275*  BUN 29* 27*  CREATININE 1.82* 1.16  CALCIUM 8.5 9.4   CMET CMP     Component Value Date/Time   NA 136 02/25/2013 0545   K 4.4 02/25/2013 0545   CL 103 02/25/2013 0545   CO2 24 02/25/2013 0545   GLUCOSE 275* 02/25/2013 0545   BUN 27* 02/25/2013 0545   CREATININE 1.16 02/25/2013 0545   CALCIUM 9.4 02/25/2013 0545   PROT 6.0 02/25/2013 0545   ALBUMIN 2.6* 02/25/2013 0545   AST 16 02/25/2013 0545   ALT 14 02/25/2013 0545   ALKPHOS 75 02/25/2013 0545   BILITOT 0.2* 02/25/2013 0545   GFRNONAA 57* 02/25/2013 0545   GFRAA 66* 02/25/2013 0545    CBG (last 3)   Recent Labs  02/24/13 1643 02/24/13 2238 02/25/13 0737  GLUCAP 270* 319* 242*    INR RESULTS:   Lab Results  Component Value Date   INR 1.01 01/04/2013   INR 1.0 04/05/2007     Studies/Results: Dg Chest 2 View  02/24/2013    CLINICAL DATA:  Shortness of breath and fever  EXAM: CHEST  2 VIEW  COMPARISON:  01/04/2013  FINDINGS: Heart size and vascular pattern are normal. Left lung is clear. There is airspace opacification in the right lower lobe with air bronchograms present.  IMPRESSION: Right lower lobe pneumonia   Electronically Signed   By: Skipper Cliche M.D.   On: 02/24/2013 10:50    Medications: I have reviewed the patient's current medications.  Assessment/Plan: #1 Pneumonia: much improved, awaiting start of mucomyst nebs, continue broad spectrum antibiotics #2 DM2: sugars too high so insulin dose will be increased #3 CAD: stable and tolerated restarting diltiazem   LOS: 1 day   [name] G 02/25/2013, 7:56 AM

## 2013-02-26 ENCOUNTER — Inpatient Hospital Stay (HOSPITAL_COMMUNITY): Payer: Medicare PPO

## 2013-02-26 LAB — GLUCOSE, CAPILLARY
Glucose-Capillary: 304 mg/dL — ABNORMAL HIGH (ref 70–99)
Glucose-Capillary: 269 mg/dL — ABNORMAL HIGH (ref 70–99)
Glucose-Capillary: 168 mg/dL — ABNORMAL HIGH (ref 70–99)

## 2013-02-26 MED ORDER — METHYLPREDNISOLONE SODIUM SUCC 40 MG IJ SOLR
40.0000 mg | Freq: Four times a day (QID) | INTRAMUSCULAR | Status: DC
  Administered 2013-02-26 – 2013-02-27 (×4): 40 mg via INTRAVENOUS
  Filled 2013-02-26 (×8): qty 1

## 2013-02-26 NOTE — Clinical Documentation Improvement (Signed)
THIS DOCUMENT IS NOT A PERMANENT PART OF THE MEDICAL RECORD  Please update your documentation with the medical record to reflect your response to this query. If you need help knowing how to do this please call [phone].  02/26/13  Dear Dr.Deep Bonawitz Rolley Sims,  In a better effort to capture your patient's severity of illness, reflect appropriate length of stay and utilization of resources, a review of the patient medical record has revealed the following indicators.    Based on your clinical judgment, please clarify and document in a progress note and/or discharge summary the clinical condition associated with the following supporting information:  In responding to this query please exercise your independent judgment.  The fact that a query is asked, does not imply that any particular answer is desired or expected.  Possible Clinical Conditions?  _______Acute Respiratory Failure _______Acute on Chronic Respiratory Failure _______Chronic Respiratory Failure _______Acute Respiratory Insufficiency _______Acute Respiratory Insufficiency following surgery or trauma _______Other Condition________________ _______Cannot Clinically Determine    Supporting Information:  Risk Factors: Hx COPD,  OSA,   Signs&Symptoms: ED notes: "hypotensive, hypoxic on initial exam. Given his hypoxia, hypotension, he was admitted for further evaluation and management"  H&P: "has severe emphysema and that requires multiple inhaled medications as well as prednisone at 2.5-5 mg every day;   pursed lip breathing with sitting at 30 degree elevation of the head of bed;  chest had crackles throughout the posterior upper to thirds of the right hemithorax COPD: Severe and should improve with high-dose albuterol nebulizers, Spiriva, and moderate dose IV corticosteroids.       Lab:Pro B Natriuretic peptide (BNP) 522.8 (*)    Radiology:CHEST 2 VIEW             IMPRESSION:  Right lower lobe pneumonia    Treatment: combination of Rocephin, Zithromax, and vancomycin,  add Mucomyst nebulizers to his frequent albuterol nebulizer treatments  Oxygen: 2 liters/[place]  CPAP   O2 Mode: ([place], mask, Ventimask, Non rebreather mask, BiPAP, Vent)  Respiratory Treatment: albuterol (PROVENTIL) (2.5 MG/3ML) 0.083% nebulizer solution  Nebulization every 4 (four) hours as needed.       You may use possible, probable, or suspect with inpatient documentation. possible, probable, suspected diagnoses MUST be documented at the time of discharge  Reviewed: additional documentation in the medical record  Thank You,  [place] Documentation Specialist: [place]

## 2013-02-26 NOTE — Progress Notes (Signed)
Subjective: Breathing is improved, having dry cough, appetite is excellent, no diarrhea  Objective: Vital signs in last 24 hours: Temp:  [97.9 F (36.6 C)-98.1 F (36.7 C)] 97.9 F (36.6 C) (10/28 0547) Pulse Rate:  [73-102] 101 (10/28 0547) Resp:  [18-20] 18 (10/28 0547) BP: (126-148)/(61-71) 138/61 mmHg (10/28 0547) SpO2:  [96 %-100 %] 96 % (10/28 0547) Weight change:    Intake/Output from previous day: 10/27 0701 - 10/28 0700 In: 3305.5 [P.O.:1200; I.V.:1905.5; IV Piggyback:200] Out: -    General appearance: alert, cooperative and no distress Resp: minimal right posterior base crackles Cardio: regular rate and rhythm GI: soft, non-tender; bowel sounds normal; no masses,  no organomegaly Extremities: bilateral trace leg edema  Lab Results:  Recent Labs  02/24/13 1010 02/25/13 0545  WBC 18.7* 31.9*  HGB 10.7* 12.6*  HCT 31.3* 37.1*  PLT 195 190   BMET  Recent Labs  02/24/13 1010 02/25/13 0545 02/25/13 1250  NA 134* 136  --   K 4.2 4.4  --   CL 101 103  --   CO2 24 24  --   GLUCOSE 51* 275* 345*  BUN 29* 27*  --   CREATININE 1.82* 1.16  --   CALCIUM 8.5 9.4  --    CMET CMP     Component Value Date/Time   NA 136 02/25/2013 0545   K 4.4 02/25/2013 0545   CL 103 02/25/2013 0545   CO2 24 02/25/2013 0545   GLUCOSE 345* 02/25/2013 1250   BUN 27* 02/25/2013 0545   CREATININE 1.16 02/25/2013 0545   CALCIUM 9.4 02/25/2013 0545   PROT 6.0 02/25/2013 0545   ALBUMIN 2.6* 02/25/2013 0545   AST 16 02/25/2013 0545   ALT 14 02/25/2013 0545   ALKPHOS 75 02/25/2013 0545   BILITOT 0.2* 02/25/2013 0545   GFRNONAA 57* 02/25/2013 0545   GFRAA 66* 02/25/2013 0545    CBG (last 3)   Recent Labs  02/25/13 1614 02/25/13 2212 02/26/13 0739  GLUCAP 329* 251* 150*    INR RESULTS:   Lab Results  Component Value Date   INR 1.01 01/04/2013   INR 1.0 04/05/2007     Studies/Results: Dg Chest 2 View  02/24/2013   CLINICAL DATA:  Shortness of breath and fever   EXAM: CHEST  2 VIEW  COMPARISON:  01/04/2013  FINDINGS: Heart size and vascular pattern are normal. Left lung is clear. There is airspace opacification in the right lower lobe with air bronchograms present.  IMPRESSION: Right lower lobe pneumonia   Electronically Signed   By: Skipper Cliche M.D.   On: 02/24/2013 10:50    Medications: I have reviewed the patient's current medications.  Assessment/Plan: #1 Pneumonia: clinically improved and we will recheck a chest X-ray today #2 Adrenal insufficiency: stable and will reduce dose of solumedrol today #3 DM2: sugars high and should improve with decreasing solumedrol   LOS: 2 days   Hung Rhinesmith G 02/26/2013, 8:26 AM

## 2013-02-26 NOTE — Progress Notes (Signed)
Met with [name] and wife at bedside. Explained [place] [facility] services. They report that they are already enrolled in the [place] [employer] program and that they believe that their needs are being met through the [facility] program. Explained that [place] [facility] could assist as well and could send a nurse out periodically to visit with patient as well. They pleasantly declined [facility] Care Management at this time. Left [facility] Care Management brochure at bedside for them to call in the future in case they change their mind.  Marthenia Rolling, MSN-Ed, RN,BSN- [facility] [phone]

## 2013-02-26 NOTE — Progress Notes (Signed)
Spoke with patient regarding cpap.  Pt said he doesn't want to wear it tonight.  Wife confirms this at bedside.  Pt and wife were advised that RT is available all night should he change his mind and need further assistance.  Pt has brought his cpap unit from home.

## 2013-02-27 LAB — BASIC METABOLIC PANEL
BUN: 28 mg/dL — ABNORMAL HIGH (ref 6–23)
CO2: 19 mEq/L (ref 19–32)
Calcium: 8.8 mg/dL (ref 8.4–10.5)
Chloride: 111 mEq/L (ref 96–112)
Creatinine, Ser: 0.7 mg/dL (ref 0.50–1.35)
GFR calc non Af Amer: 86 mL/min — ABNORMAL LOW (ref >90)
Potassium: 3.7 mEq/L (ref 3.5–5.1)

## 2013-02-27 LAB — CBC
HCT: 33.6 % — ABNORMAL LOW (ref 39.0–52.0)
MCH: 28 pg (ref 26.0–34.0)
MCV: 82 fL (ref 78.0–100.0)
Platelets: 191 10*3/uL (ref 150–400)
RBC: 4.1 MIL/uL — ABNORMAL LOW (ref 4.22–5.81)
RDW: 14.3 % (ref 11.5–15.5)
WBC: 18 10*3/uL — ABNORMAL HIGH (ref 4.0–10.5)

## 2013-02-27 LAB — GLUCOSE, CAPILLARY
Glucose-Capillary: 159 mg/dL — ABNORMAL HIGH (ref 70–99)
Glucose-Capillary: 256 mg/dL — ABNORMAL HIGH (ref 70–99)

## 2013-02-27 MED ORDER — INSULIN DETEMIR 100 UNIT/ML ~~LOC~~ SOLN
20.0000 [IU] | Freq: Two times a day (BID) | SUBCUTANEOUS | Status: DC
  Administered 2013-02-27 – 2013-02-28 (×4): 20 [IU] via SUBCUTANEOUS
  Filled 2013-02-27 (×5): qty 0.2

## 2013-02-27 MED ORDER — METHYLPREDNISOLONE SODIUM SUCC 125 MG IJ SOLR
80.0000 mg | Freq: Four times a day (QID) | INTRAMUSCULAR | Status: DC
  Administered 2013-02-27 – 2013-03-01 (×8): 80 mg via INTRAVENOUS
  Filled 2013-02-27 (×12): qty 1.28

## 2013-02-27 NOTE — Progress Notes (Signed)
Patient has his home CPAP machine at bedside. Wife is in room with assistance. Both are aware to call for further assistance from RT if needed. Patient has not been using his machine during the nights due to being awake most of the night and "up and down" per the wife.

## 2013-02-27 NOTE — Progress Notes (Signed)
Called to patient room for patient wife, who requests that the patient have a breathing treatment. Long periods of apnea noted with classic signs of OSA. Awakened patient and he agrees to take hhn. He also agrees to try his home CPAP. VSS at this time, and is on home CPAP with home full face mask and 3L O2 bleed in via canula under the mask. Patint prefers to have canula on with supplemental O2 in the event that he does take off the CPAP. Mask adjusted to minimize leak.

## 2013-02-27 NOTE — Evaluation (Signed)
Pts o2 saturation was 98 on roomair He walked the full length of the hall His o2 saturation was 95 on room air. His saturation was 96 when his o2 was relaced.He tolerated the walk very well.

## 2013-02-27 NOTE — Progress Notes (Signed)
Subjective: Having some wheezing and dyspnea with minimal activity today  Objective: Vital signs in last 24 hours: Temp:  [97.4 F (36.3 C)-98.2 F (36.8 C)] 97.4 F (36.3 C) (10/29 0553) Pulse Rate:  [92-100] 96 (10/29 0553) Resp:  [20] 20 (10/29 0553) BP: (126-161)/(60-72) 161/72 mmHg (10/29 0553) SpO2:  [96 %-100 %] 98 % (10/29 0553) Weight change:    Intake/Output from previous day: 10/28 0701 - 10/29 0700 In: 2210 [P.O.:960; I.V.:1250] Out: -    General appearance: alert, cooperative and mild distress Resp: bilateral mild scattered wheezing and right base crackles, no use of accessory muscles of respiration Cardio: regular rate and rhythm GI: soft, non-tender; bowel sounds normal; no masses,  no organomegaly Extremities: extremities normal, atraumatic, no cyanosis or edema  Lab Results:  Recent Labs  02/25/13 0545 02/27/13 0620  WBC 31.9* 18.0*  HGB 12.6* 11.5*  HCT 37.1* 33.6*  PLT 190 191   BMET  Recent Labs  02/25/13 0545 02/25/13 1250 02/27/13 0620  NA 136  --  140  K 4.4  --  3.7  CL 103  --  111  CO2 24  --  19  GLUCOSE 275* 345* 197*  BUN 27*  --  28*  CREATININE 1.16  --  0.70  CALCIUM 9.4  --  8.8   CMET CMP     Component Value Date/Time   NA 140 02/27/2013 0620   K 3.7 02/27/2013 0620   CL 111 02/27/2013 0620   CO2 19 02/27/2013 0620   GLUCOSE 197* 02/27/2013 0620   BUN 28* 02/27/2013 0620   CREATININE 0.70 02/27/2013 0620   CALCIUM 8.8 02/27/2013 0620   PROT 6.0 02/25/2013 0545   ALBUMIN 2.6* 02/25/2013 0545   AST 16 02/25/2013 0545   ALT 14 02/25/2013 0545   ALKPHOS 75 02/25/2013 0545   BILITOT 0.2* 02/25/2013 0545   GFRNONAA 86* 02/27/2013 0620   GFRAA >90 02/27/2013 0620    CBG (last 3)   Recent Labs  02/26/13 1117 02/26/13 1619 02/26/13 2305  GLUCAP 304* 269* 168*    INR RESULTS:   Lab Results  Component Value Date   INR 1.01 01/04/2013   INR 1.0 04/05/2007     Studies/Results: Dg Chest 2  View  02/26/2013   CLINICAL DATA:  Cough followup pneumonia  EXAM: CHEST  2 VIEW  COMPARISON:  02/24/2013  FINDINGS: Cardiac shadow is stable. The left lung remains clear. Persistent infiltrate is noted in the right lower lobe although the degree of aeration has improved when compared with the prior exam. Degenerative change of the thoracic spine is noted. No other focal abnormality is seen.  IMPRESSION: Persistent but improving right lower lobe infiltrate.   Electronically Signed   By: Inez Catalina M.D.   On: 02/26/2013 09:35    Medications: I have reviewed the patient's current medications.  Assessment/Plan: #1 Pneumonia: improving however having dyspnea due to reduced steroid dose. Will increase steroids and check oxygen sats at rest, with walking, and then after [place] oxygen applied #2 DM2: sugars a bit too high, so insulin will be increased   LOS: 3 days   Aztlan Coll G 02/27/2013, 8:08 AM

## 2013-02-28 ENCOUNTER — Inpatient Hospital Stay (HOSPITAL_COMMUNITY): Payer: Medicare PPO

## 2013-02-28 LAB — GLUCOSE, CAPILLARY
Glucose-Capillary: 166 mg/dL — ABNORMAL HIGH (ref 70–99)
Glucose-Capillary: 182 mg/dL — ABNORMAL HIGH (ref 70–99)

## 2013-02-28 LAB — CBC
HCT: 34.7 % — ABNORMAL LOW (ref 39.0–52.0)
Hemoglobin: 12.1 g/dL — ABNORMAL LOW (ref 13.0–17.0)
MCV: 81.3 fL (ref 78.0–100.0)
RDW: 14.5 % (ref 11.5–15.5)
WBC: 12.5 10*3/uL — ABNORMAL HIGH (ref 4.0–10.5)

## 2013-02-28 MED ORDER — NYSTATIN 100000 UNIT/ML MT SUSP
5.0000 mL | Freq: Four times a day (QID) | OROMUCOSAL | Status: DC
  Administered 2013-02-28 – 2013-03-01 (×7): 500000 [IU] via ORAL
  Filled 2013-02-28 (×8): qty 5

## 2013-02-28 MED ORDER — MAGIC MOUTHWASH
5.0000 mL | Freq: Three times a day (TID) | ORAL | Status: DC | PRN
  Filled 2013-02-28: qty 5

## 2013-02-28 MED ORDER — IRBESARTAN 150 MG PO TABS
150.0000 mg | ORAL_TABLET | Freq: Every day | ORAL | Status: DC
  Administered 2013-02-28 – 2013-03-01 (×2): 150 mg via ORAL
  Filled 2013-02-28 (×2): qty 1

## 2013-02-28 MED ORDER — FUROSEMIDE 40 MG PO TABS
40.0000 mg | ORAL_TABLET | Freq: Every day | ORAL | Status: DC
  Administered 2013-02-28: 40 mg via ORAL
  Filled 2013-02-28 (×2): qty 1

## 2013-02-28 MED ORDER — VANCOMYCIN HCL IN DEXTROSE 750-5 MG/150ML-% IV SOLN
750.0000 mg | Freq: Two times a day (BID) | INTRAVENOUS | Status: DC
  Administered 2013-02-28 – 2013-03-01 (×3): 750 mg via INTRAVENOUS
  Filled 2013-02-28 (×5): qty 150

## 2013-02-28 NOTE — Progress Notes (Signed)
Subjective: Did not tolerate CPAP last night, kept mask on only for about 15 minutes, has sore throat today and continued mild dyspnea with conversation.  Objective: Vital signs in last 24 hours: Temp:  [97.6 F (36.4 C)-98 F (36.7 C)] 97.7 F (36.5 C) (10/30 0505) Pulse Rate:  [93-97] 97 (10/30 0505) Resp:  [20-22] 22 (10/30 0505) BP: (164-178)/(67-93) 172/79 mmHg (10/30 0505) SpO2:  [97 %-100 %] 97 % (10/30 0505) Weight change:    Intake/Output from previous day: 10/29 0701 - 10/30 0700 In: 1200 [P.O.:600; I.V.:400; IV Piggyback:200] Out: -    General appearance: alert, cooperative and mild distress Resp: clear to auscultation bilaterally Cardio: regular rate and rhythm GI: soft, non-tender; bowel sounds normal; no masses,  no organomegaly Extremities: extremities normal, atraumatic, no cyanosis or edema  Lab Results:  Recent Labs  02/27/13 0620  WBC 18.0*  HGB 11.5*  HCT 33.6*  PLT 191   BMET  Recent Labs  02/25/13 1250 02/27/13 0620  NA  --  140  K  --  3.7  CL  --  111  CO2  --  19  GLUCOSE 345* 197*  BUN  --  28*  CREATININE  --  0.70  CALCIUM  --  8.8   CMET CMP     Component Value Date/Time   NA 140 02/27/2013 0620   K 3.7 02/27/2013 0620   CL 111 02/27/2013 0620   CO2 19 02/27/2013 0620   GLUCOSE 197* 02/27/2013 0620   BUN 28* 02/27/2013 0620   CREATININE 0.70 02/27/2013 0620   CALCIUM 8.8 02/27/2013 0620   PROT 6.0 02/25/2013 0545   ALBUMIN 2.6* 02/25/2013 0545   AST 16 02/25/2013 0545   ALT 14 02/25/2013 0545   ALKPHOS 75 02/25/2013 0545   BILITOT 0.2* 02/25/2013 0545   GFRNONAA 86* 02/27/2013 0620   GFRAA >90 02/27/2013 0620    CBG (last 3)   Recent Labs  02/27/13 1108 02/27/13 1647 02/27/13 2107  GLUCAP 192* 159* 256*    INR RESULTS:   Lab Results  Component Value Date   INR 1.01 01/04/2013   INR 1.0 04/05/2007     Studies/Results: Dg Chest 2 View  02/26/2013   CLINICAL DATA:  Cough followup pneumonia  EXAM:  CHEST  2 VIEW  COMPARISON:  02/24/2013  FINDINGS: Cardiac shadow is stable. The left lung remains clear. Persistent infiltrate is noted in the right lower lobe although the degree of aeration has improved when compared with the prior exam. Degenerative change of the thoracic spine is noted. No other focal abnormality is seen.  IMPRESSION: Persistent but improving right lower lobe infiltrate.   Electronically Signed   By: Inez Catalina M.D.   On: 02/26/2013 09:35    Medications: I have reviewed the patient's current medications.  Assessment/Plan: #1 Dyspnea: due to COPD with RLL pneumonia and slowly improving. Will check BNP and Chest X-ray today. #2 Sore Throat: possibly due to thrush from antibiotics, so will add nystatin and magic mouthwash prn #3 DM2: stable on SSI #4 HTN; BP is consistently high so will restart ARB  #5 Obstructive Sleep Apnea: intolerant of his full facemask so will ask RT to try nasal pillows  LOS: 4 days   [name] 02/28/2013, 7:50 AM

## 2013-02-28 NOTE — Progress Notes (Signed)
ANTIBIOTIC CONSULT NOTE - FOLLOW UP  Pharmacy Consult for Vancomycin Indication: pneumonia  Allergies  Allergen Reactions  . Ciprofloxacin Hcl Other (See Comments)    Unknown   . Penicillins     unknown  . Sulfonamide Derivatives     Unknown     Patient Measurements: Height: 5\' 6"  (167.6 cm) Weight: 143 lb (64.864 kg) IBW/kg (Calculated) : 63.8 Adjusted Body Weight:   Vital Signs: Temp: 97.7 F (36.5 C) (10/30 0505) Temp src: Oral (10/30 0505) BP: 172/79 mmHg (10/30 0505) Pulse Rate: 97 (10/30 0505) Intake/Output from previous day: 10/29 0701 - 10/30 0700 In: 1200 [P.O.:600; I.V.:400; IV Piggyback:200] Out: -  Intake/Output from this shift:    Labs:  Recent Labs  02/27/13 0620  WBC 18.0*  HGB 11.5*  PLT 191  CREATININE 0.70   Estimated Creatinine Clearance: 65.4 ml/min (by C-G formula based on Cr of 0.7).  Recent Labs  02/27/13 2310  [place] 9.0*     Microbiology: No results found for this or any previous visit (from the past 720 hour(s)).  Anti-infectives   Start     Dose/Rate Route Frequency Ordered Stop   02/28/13 0800  vancomycin (VANCOCIN) IVPB 750 mg/150 ml premix     750 mg 150 mL/hr over 60 Minutes Intravenous Every 12 hours 02/28/13 0551     02/25/13 1200  azithromycin (ZITHROMAX) 500 mg in dextrose 5 % 250 mL IVPB  Status:  Discontinued     500 mg 250 mL/hr over 60 Minutes Intravenous Every 24 hours 02/24/13 1314 02/27/13 0814   02/25/13 1200  cefTRIAXone (ROCEPHIN) 1 g in dextrose 5 % 50 mL IVPB     1 g 100 mL/hr over 30 Minutes Intravenous Every 24 hours 02/24/13 1314     02/25/13 1200  vancomycin (VANCOCIN) 500 mg in sodium chloride 0.9 % 100 mL IVPB  Status:  Discontinued     500 mg 100 mL/hr over 60 Minutes Intravenous Every 12 hours 02/25/13 1021 02/28/13 0550   02/24/13 1500  vancomycin (VANCOCIN) IVPB 1000 mg/200 mL premix  Status:  Discontinued     1,000 mg 200 mL/hr over 60 Minutes Intravenous Every 24 hours 02/24/13 1405  02/25/13 1021   02/24/13 1115  azithromycin (ZITHROMAX) 500 mg in dextrose 5 % 250 mL IVPB     500 mg 250 mL/hr over 60 Minutes Intravenous  Once 02/24/13 1102 02/24/13 1354   02/24/13 1115  cefTRIAXone (ROCEPHIN) 1 g in dextrose 5 % 50 mL IVPB     1 g 100 mL/hr over 30 Minutes Intravenous  Once 02/24/13 1102 02/24/13 1203      Assessment: Patient with low vancomycin level.    Goal of Therapy:  Vancomycin trough level 15-20 mcg/ml  Plan:  Measure antibiotic drug levels at steady state Follow up culture results Change vancomycin to 750mg  iv q12hr  [name], Shea Stakes [place] 02/28/2013,5:53 AM

## 2013-02-28 NOTE — Progress Notes (Signed)
Patient noted to have not tolerated his home CPAP mask/machine well last night. Previous to last night, he has refused to attempt it during this admission. He is a mouth breather, and therefore is not a candidate for a nasal mask without the use of a chin strap. Dr. Philip Aspen notes read and patient/family informed that the hospital does not supply nasal pillows. This type of apparatus must come from the home health care that supplies his CPAP. Also, since he is noted to be a mouth-breather, he would need a chin strap with nasal pillows too. The patient states that he does not tolerate his home mask because his night time movement is constricted and not "free". Any apparatus that is used will have some confinement of his face/head movement during the night. Special pillows are available at select locations in the community that can help to alleviate the movement of the mask on his face during position changes in the night. This information was also explained to the patient and family. He has agreed to attempt his home mask again tonight, but does not think he will do any better than usual. He and his wife both report that this intolerance of the mask is not new. He has not tolerated it well at home either. They are both aware that RT is available in the night if further assistance is needed.

## 2013-02-28 NOTE — Progress Notes (Signed)
Assisted patient with CPAP application. He is using his home equipment with a full face mask. VSS with no supplemental oxygen at this time. He is aware that RT is available during the night if needed.

## 2013-03-01 DIAGNOSIS — I519 Heart disease, unspecified: Secondary | ICD-10-CM

## 2013-03-01 LAB — GLUCOSE, CAPILLARY
Glucose-Capillary: 177 mg/dL — ABNORMAL HIGH (ref 70–99)
Glucose-Capillary: 85 mg/dL (ref 70–99)

## 2013-03-01 MED ORDER — NYSTATIN 100000 UNIT/ML MT SUSP: 5.0000 mL | Freq: Four times a day (QID) | OROMUCOSAL | Status: DC

## 2013-03-01 MED ORDER — INSULIN ASPART 100 UNIT/ML ~~LOC~~ SOLN: 0.0000 [IU] | Freq: Three times a day (TID) | SUBCUTANEOUS | Status: DC

## 2013-03-01 MED ORDER — INSULIN ASPART 100 UNIT/ML ~~LOC~~ SOLN: 0.0000 [IU] | Freq: Every day | SUBCUTANEOUS | Status: DC

## 2013-03-01 MED ORDER — PREDNISONE (PAK) 10 MG PO TABS: ORAL_TABLET | ORAL | Status: DC

## 2013-03-01 MED ORDER — DOXYCYCLINE HYCLATE 50 MG PO CAPS: 100.0000 mg | ORAL_CAPSULE | Freq: Two times a day (BID) | ORAL | Status: DC

## 2013-03-01 MED ORDER — INSULIN ASPART 100 UNIT/ML ~~LOC~~ SOLN
0.0000 [IU] | Freq: Three times a day (TID) | SUBCUTANEOUS | Status: DC
  Administered 2013-03-01: 11 [IU] via SUBCUTANEOUS
  Administered 2013-03-01: 8 [IU] via SUBCUTANEOUS

## 2013-03-01 NOTE — Progress Notes (Signed)
Subjective: Bothered by nocturia every 30 min last night (after lasix dose), breathing is much improved today.  Objective: Vital signs in last 24 hours: Temp:  [97.5 F (36.4 C)-98 F (36.7 C)] 97.5 F (36.4 C) (10/31 0600) Pulse Rate:  [93-99] 93 (10/31 0600) Resp:  [18-20] 18 (10/31 0600) BP: (149-166)/(66-84) 149/68 mmHg (10/31 0600) SpO2:  [97 %-98 %] 98 % (10/30 2055) Weight change:    Intake/Output from previous day: 10/30 0701 - 10/31 0700 In: 1786.8 [I.V.:1486.8; IV Piggyback:300] Out: -    General appearance: alert, cooperative and no distress Resp: clear to auscultation bilaterally and with hyperexpanded lungs Cardio: regular rate and rhythm and with a 1/6 SEM GI: soft, non-tender; bowel sounds normal; no masses,  no organomegaly Extremities: bilateral trace leg edema  Lab Results:  Recent Labs  02/27/13 0620 02/28/13 0837  WBC 18.0* 12.5*  HGB 11.5* 12.1*  HCT 33.6* 34.7*  PLT 191 181   BMET  Recent Labs  02/27/13 0620  NA 140  K 3.7  CL 111  CO2 19  GLUCOSE 197*  BUN 28*  CREATININE 0.70  CALCIUM 8.8   CMET CMP     Component Value Date/Time   NA 140 02/27/2013 0620   K 3.7 02/27/2013 0620   CL 111 02/27/2013 0620   CO2 19 02/27/2013 0620   GLUCOSE 197* 02/27/2013 0620   BUN 28* 02/27/2013 0620   CREATININE 0.70 02/27/2013 0620   CALCIUM 8.8 02/27/2013 0620   PROT 6.0 02/25/2013 0545   ALBUMIN 2.6* 02/25/2013 0545   AST 16 02/25/2013 0545   ALT 14 02/25/2013 0545   ALKPHOS 75 02/25/2013 0545   BILITOT 0.2* 02/25/2013 0545   GFRNONAA 86* 02/27/2013 0620   GFRAA >90 02/27/2013 0620    CBG (last 3)   Recent Labs  02/28/13 0801 02/28/13 1155 02/28/13 2054  GLUCAP 166* 286* 182*    INR RESULTS:   Lab Results  Component Value Date   INR 1.01 01/04/2013   INR 1.0 04/05/2007     Studies/Results: Dg Chest 2 View  02/28/2013   CLINICAL DATA:  Right lower lobe pneumonia. Dyspnea.  EXAM: CHEST  2 VIEW  COMPARISON:  Multiple  exams, including 02/26/2013  FINDINGS: Improvement in right lower lobe airspace opacity observed. Airspace opacity is not completely cleared.  Scarring or subsegmental atelectasis in the left lower lobe. Mild cardiomegaly persists.  IMPRESSION: 1. Improved but not resolved right lower lobe pneumonia. Followup chest radiography is recommended in 4 weeks time to ensure resolution and exclude underlying malignancy. 2. Scarring or subsegmental atelectasis in the left lower lobe. 3. Stable mild cardiomegaly.   Electronically Signed   By: Sherryl Barters M.D.   On: 02/28/2013 08:56    Medications: I have reviewed the patient's current medications.  Assessment/Plan: #1 Dyspnea: improved and from COPD with pneumonia and mild CHF. Will check echo today, adjust meds and possibly discharge this afternoon #2 DM2: stable and will need less insulin upon discharge   LOS: 5 days   Philmore Lepore G 03/01/2013, 8:05 AM

## 2013-03-01 NOTE — Progress Notes (Signed)
*  PRELIMINARY RESULTS* Echocardiogram 2D Echocardiogram has been performed.  Leavy Cella 03/01/2013, 2:36 PM

## 2013-03-01 NOTE — Progress Notes (Signed)
Inpatient Diabetes Program Recommendations  AACE/ADA: New Consensus Statement on Inpatient Glycemic Control (2013)  Target Ranges:  Prepandial:   less than 140 mg/dL      Peak postprandial:   less than 180 mg/dL (1-2 hours)      Critically ill patients:  140 - 180 mg/dL   Reason for Visit: Hyperglycemia with Hypoglycemia   Results for [name], [name] (MRN [id number]) as of 03/01/2013 15:50  Ref. Range 02/28/2013 11:55 02/28/2013 15:50 02/28/2013 20:54 03/01/2013 07:52 03/01/2013 08:08 03/01/2013 08:30 03/01/2013 11:23  Glucose-Capillary Latest Range: 70-99 mg/dL 286 (H) 177 (H) 182 (H) 49 (L) 59 (L) 85 289 (H)   Blood sugars very labile. May benefit from reduction in basal insulin - Lantus to 10 units QHS Consider addition of meal coverage insulin while on steroids - Novolog 3 units tidwc  Will continue to follow. Thank you. Lorenda Peck, RD, LDN, CDE Inpatient Diabetes Coordinator [phone]

## 2013-03-01 NOTE — Progress Notes (Signed)
Patient discharged to home. Left unit in wheelchair pushed by nurse tech accompanied by wife and son, who is in to transport pt to home. Left in good condition. Vwilliams,rn.

## 2013-03-01 NOTE — Progress Notes (Signed)
Pt given discharge instructions with wife at bedside. No concerns voiced. Encouraged pt and wife to stop by the pharmacy and pick up meds, which prescriptions were electronically sent by MD. Acknowledged same. Pt and wife are in room awaiting ride to be transported to home. Vwilliams,rn.

## 2013-03-01 NOTE — Discharge Summary (Addendum)
Physician Discharge Summary  Patient ID: [name] MRN: [id number] DOB/AGE: [birth year] 79 y.o.  Admit date: 02/24/2013 Discharge date: 03/01/2013   Discharge Diagnoses:  Principal Problem:   Community acquired pneumonia Active Problems:   Coronary artery disease   OBSTRUCTIVE SLEEP APNEA   CHRONIC OBSTRUCTIVE PULMONARY DISEASE, SEVERE   Type II or unspecified type diabetes mellitus with neurological manifestations, not stated as uncontrolled(250.60)   Iatrogenic adrenal insufficiency   Discharged Condition: good  Hospital Course:   Patient is an 79 year old man of [place] descent who is well-known to me. I serve as his primary care physician. He has severe emphysema and that requires multiple inhaled medications as well as prednisone at 2.5-5 mg every day. He was in his usual state of fairly good health until yesterday when he developed a fever of greater than 100F. His son is a physician, and he assessed him at his home and indicated that he was not with significant dyspnea at that time. We started him on Levaquin 500 mg by mouth daily and his prednisone dose was increased to 10 mg daily. Overnight his condition worsened and in the morning he had a temperature of greater than [id number], with systolic blood pressure 60 when sitting up, and dizziness that precluded walking, so EMS was called and he was transported to the emergency room for evaluation. In the emergency room his workup was most significant for a chest x-ray with right lower lobe pneumonia, moderate leukocytosis, and initial hypotension that improved with high dose solumedrol and IV fluids. At the time of his initial evaluation he had pursed lip breathing with sitting at 30 degree elevation of the head of bed, and had an occasional nonproductive cough without pleuritic chest discomfort. He denies recent angina sxs, abdominal pain, nausea, or painful urination.   He was admitted to the hospital and started on high-dose  albuterol nebulizers with IV corticosteroids as well as broad-spectrum antibiotics with Rocephin, IV Zithromax, and IV vancomycin.  He did well with this with slow improvement in his symptoms.  Serial chest x-ray showed gradually improving appearance of his right lower lobe pneumonia.  He also had a BNP test done that was elevated greater than 2000, so he is briefly treated with Lasix which was associated with improved dyspnea.  He also had a transthoracic echocardiogram done on the day of discharge with results pending at the time of discharge.  He had difficulty using CPAP with his full facemask for known obstructive sleep apnea, and consideration will be given for a trial of nasal pillows as an outpatient.  On the day of discharge he was not having dyspnea at rest off of oxygen.  He had been eating and drinking well and was feeling tired from significant nocturia associated with Lasix.  While in the hospital his pulse oximetry was checked at rest, then after walking in the hallways and his oxygen saturation levels remain greater than 92%.  He had acute respiratory failure that slowly improved with treatment. He had no procedures done during his hospitalization and there were no complications associated with his hospitalization.  Consults: None  Significant Diagnostic Studies:  Dg Chest 2 View  02/28/2013   CLINICAL DATA:  Right lower lobe pneumonia. Dyspnea.  EXAM: CHEST  2 VIEW  COMPARISON:  Multiple exams, including 02/26/2013  FINDINGS: Improvement in right lower lobe airspace opacity observed. Airspace opacity is not completely cleared.  Scarring or subsegmental atelectasis in the left lower lobe. Mild cardiomegaly persists.  IMPRESSION: 1. Improved  but not resolved right lower lobe pneumonia. Followup chest radiography is recommended in 4 weeks time to ensure resolution and exclude underlying malignancy. 2. Scarring or subsegmental atelectasis in the left lower lobe. 3. Stable mild cardiomegaly.    Electronically Signed   By: Sherryl Barters M.D.   On: 02/28/2013 08:56    Labs: Lab Results  Component Value Date   WBC 12.5* 02/28/2013   HGB 12.1* 02/28/2013   HCT 34.7* 02/28/2013   MCV 81.3 02/28/2013   PLT 181 02/28/2013     Recent Labs Lab 02/25/13 0545  02/27/13 0620  NA 136  --  140  K 4.4  --  3.7  CL 103  --  111  CO2 24  --  19  BUN 27*  --  28*  CREATININE 1.16  --  0.70  CALCIUM 9.4  --  8.8  PROT 6.0  --   --   BILITOT 0.2*  --   --   ALKPHOS 75  --   --   ALT 14  --   --   AST 16  --   --   GLUCOSE 275*  < > 197*  < > = values in this interval not displayed.     Lab Results  Component Value Date   INR 1.01 01/04/2013   INR 1.0 04/05/2007     No results found for this or any previous visit (from the past 240 hour(s)).    Discharge Exam: Blood pressure 171/84, pulse 109, temperature 97.9 F (36.6 C), temperature source Oral, resp. rate 16, height 5\' 6"  (1.676 m), weight 64.864 kg (143 lb), SpO2 95.00%.  Physical Exam: In general, he is an elderly man who was in no apparent distress while sitting upright in bed on room air.  HEENT exam was within normal limits, neck had jugular venous distention halfway to the angle of his jaw one sitting upright, chest had clear, hyperexpanded lungs, heart had a regular rate and rhythm with a systolic ejection murmur grade 2/6 at the left sternal border, abdomen had normal bowel sounds and no tenderness, he had bilateral trace leg edema.  He was alert and well oriented with mildly decreased hearing despite hearing aids.   He was able to use all extremities well.  Disposition:he will be discharged from the hospital in the company of his wife.  While he is on moderate dose prednisone he will have sliding scale insulin available as needed and his son plans on assisting with sliding scale insulin during this time period.  On Monday he should call my nurse, DJ, to schedule a follow-up visit later this coming week.  Note that the  process of discharge required 40 minutes. At his follow-up visit we will check results of the echocardiogram that was done today.  Discharge Orders   Future Orders Complete By Expires   Call MD for:  As directed    Comments:     Call for fever, chills, worsening breathing, blood sugars consistently over 350 or less than 70, or any other concerning symptoms.   Diet - low sodium heart healthy  As directed    Discharge instructions  As directed    Comments:     Check blood sugars with each meal and give insulin by the sliding scale as needed. Blood sugars will drop as dose of prednisone is decreased.   Increase activity slowly  As directed        Medication List    STOP taking  these medications       predniSONE 5 MG tablet  Commonly known as:  DELTASONE  Replaced by:  predniSONE 10 MG tablet      TAKE these medications       albuterol (2.5 MG/3ML) 0.083% nebulizer solution  Commonly known as:  PROVENTIL  Take 0.83 mg by nebulization every 4 (four) hours as needed.     allopurinol 100 MG tablet  Commonly known as:  ZYLOPRIM  Take 100 mg by mouth daily.     aspirin 81 MG tablet  Take 81 mg by mouth daily.     atorvastatin 20 MG tablet  Commonly known as:  LIPITOR  Take 20 mg by mouth daily.     budesonide-formoterol 160-4.5 MCG/ACT inhaler  Commonly known as:  SYMBICORT  Inhale 2 puffs into the lungs 2 (two) times daily.     cetaphil cream  Apply 1 application topically as needed (itching). As needed for itching     clopidogrel 75 MG tablet  Commonly known as:  PLAVIX  Take 75 mg by mouth daily.     diltiazem 120 MG 24 hr capsule  Commonly known as:  TIAZAC  Take 120 mg by mouth daily.     doxycycline 50 MG capsule  Commonly known as:  VIBRAMYCIN  Take 2 capsules (100 mg total) by mouth 2 (two) times daily.     furosemide 20 MG tablet  Commonly known as:  LASIX  Take 20 mg by mouth daily.     glimepiride 2 MG tablet  Commonly known as:  AMARYL  Take 2 mg  by mouth daily before breakfast.     HYDROcodone-acetaminophen 5-500 MG per tablet  Commonly known as:  VICODIN  As needed for pain     insulin aspart 100 UNIT/ML injection  Commonly known as:  novoLOG  Inject 0-15 Units into the skin 3 (three) times daily with meals.     iron polysaccharides 150 MG capsule  Commonly known as:  NIFEREX  Take 150 mg by mouth daily.     levofloxacin 500 MG tablet  Commonly known as:  LEVAQUIN  Take 500 mg by mouth daily. 10 days     montelukast 10 MG tablet  Commonly known as:  SINGULAIR  Take 10 mg by mouth at bedtime.     nitroGLYCERIN 0.4 MG SL tablet  Commonly known as:  NITROSTAT  Place 0.4 mg under the tongue every 5 (five) minutes as needed.     nystatin 100000 UNIT/ML suspension  Commonly known as:  MYCOSTATIN  Take 5 mLs (500,000 Units total) by mouth 4 (four) times daily.     pantoprazole 40 MG tablet  Commonly known as:  PROTONIX  Take 40 mg by mouth 2 (two) times daily.     predniSONE 10 MG tablet  Commonly known as:  STERAPRED UNI-PAK  Take 4 tablets daily for 3 days, then 3 tablets daily for 3 days, then 2 tablets daily for 3 days, then 1 tablet daily for 3 days     roflumilast 500 MCG Tabs tablet  Commonly known as:  DALIRESP  Take 500 mcg by mouth daily.     sitaGLIPtin-metformin 50-500 MG per tablet  Commonly known as:  JANUMET  Take 1 tablet by mouth 2 (two) times daily with a meal.     solifenacin 5 MG tablet  Commonly known as:  VESICARE  Take 10 mg by mouth daily.     tamsulosin 0.4 MG Caps capsule  Commonly  known as:  FLOMAX  Take 0.4 mg by mouth daily after supper.     telmisartan 80 MG tablet  Commonly known as:  MICARDIS  Take 80 mg by mouth daily.     tiotropium 18 MCG inhalation capsule  Commonly known as:  SPIRIVA  Place 18 mcg into inhaler and inhale daily.     vitamin C 500 MG tablet  Commonly known as:  ASCORBIC ACID  Take 500 mg by mouth daily.           Follow-up Information    Follow up with Donnajean Lopes, MD. Schedule an appointment as soon as possible for a visit in 1 week.   Specialty:  Internal Medicine   Contact information:   [facility] [place] [address] [phone]       Signed: Donnajean Lopes 03/01/2013, 5:24 PM

## 2013-03-07 ENCOUNTER — Other Ambulatory Visit: Payer: Self-pay

## 2013-03-18 ENCOUNTER — Encounter: Payer: Self-pay | Admitting: Pulmonary Disease

## 2013-03-18 ENCOUNTER — Ambulatory Visit (INDEPENDENT_AMBULATORY_CARE_PROVIDER_SITE_OTHER): Payer: Medicare PPO | Admitting: Pulmonary Disease

## 2013-03-18 VITALS — BP 124/54 | HR 84 | Ht 66.0 in | Wt 148.0 lb

## 2013-03-18 DIAGNOSIS — J189 Pneumonia, unspecified organism: Secondary | ICD-10-CM

## 2013-03-18 DIAGNOSIS — J449 Chronic obstructive pulmonary disease, unspecified: Secondary | ICD-10-CM

## 2013-03-18 DIAGNOSIS — G4733 Obstructive sleep apnea (adult) (pediatric): Secondary | ICD-10-CM

## 2013-03-18 DIAGNOSIS — J4489 Other specified chronic obstructive pulmonary disease: Secondary | ICD-10-CM

## 2013-03-18 NOTE — Progress Notes (Signed)
Chief Complaint  Patient presents with  . Sleep Apnea    Currently not using CPAP due to discomfort. Wondering if nasal pillows would help.    History of Present Illness:  CC: [name] is a 79 y.o. male with severe COPD and moderate OSA on CPAP 7 cm H2O.  He was treated for pneumonia in October, 2014.  He is slowly recovering.  He is back to baseline dose of 5 mg prednisone daily.  His breathing has been okay.  He is not having much cough, wheeze, sputum, or chest pain.  He is trying to get back to his usual exercise routine.  His main problem today is related to CPAP use.  He has not been using his CPAP consistently.  He has trouble with mask fit.  He can fall asleep with his CPAP, but then the mask will come off during the night.  TESTS: PFT 07/31/05 >> FEV1 1.30 (54%), FEV1% 45, TLC 4.89 (91%), DLCO 55% PSG 07/2006 >> AHI 23 Echo 03/01/13 >> EF 65 to [id number], grade 1 diastolic dysfx  He  has a past medical history of TIA (transient ischemic attack) (12/2006); OSA (obstructive sleep apnea); COPD (chronic obstructive pulmonary disease); Rhinitis; Hypertension; DM (diabetes mellitus); DJD (degenerative joint disease); Dyslipidemia; Hiatal hernia; Hemorrhoids; Diverticulosis; Overactive bladder; Shingles; Asthma; Seasonal allergies; GERD (gastroesophageal reflux disease); Anemia; Bilateral sensorineural hearing loss; Osteoarthritis; Benign prostatic hypertrophy; CAD (coronary artery disease) (07/2003); and Stroke.  He  has past surgical history that includes L5 laminectomy (2004); right total knee (2006); Coronary angioplasty with stent (07/2003); Inner ear surgery; Back surgery; and Joint replacement.   Current Outpatient Prescriptions on File Prior to Visit  Medication Sig Dispense Refill  . albuterol (PROVENTIL) (2.5 MG/3ML) 0.083% nebulizer solution Take 0.83 mg by nebulization every 4 (four) hours as needed.       Marland Kitchen allopurinol (ZYLOPRIM) 100 MG tablet Take 100 mg  by mouth daily.       Marland Kitchen aspirin 81 MG tablet Take 81 mg by mouth daily.       Marland Kitchen atorvastatin (LIPITOR) 20 MG tablet Take 20 mg by mouth daily.        . budesonide-formoterol (SYMBICORT) 160-4.5 MCG/ACT inhaler Inhale 2 puffs into the lungs 2 (two) times daily.        . cetaphil (CETAPHIL) cream Apply 1 application topically as needed (itching). As needed for itching      . clopidogrel (PLAVIX) 75 MG tablet Take 75 mg by mouth daily.      Marland Kitchen diltiazem (TIAZAC) 120 MG 24 hr capsule Take 120 mg by mouth daily.      . furosemide (LASIX) 20 MG tablet Take 20 mg by mouth daily.        Marland Kitchen glimepiride (AMARYL) 2 MG tablet Take 2 mg by mouth daily before breakfast.      . HYDROcodone-acetaminophen (VICODIN) 5-500 MG per tablet As needed for pain       . insulin aspart (NOVOLOG) 100 UNIT/ML injection Inject 0-15 Units into the skin 3 (three) times daily with meals.  1 vial  12  . iron polysaccharides (NIFEREX) 150 MG capsule Take 150 mg by mouth daily.       . montelukast (SINGULAIR) 10 MG tablet Take 10 mg by mouth at bedtime.        . nitroGLYCERIN (NITROSTAT) 0.4 MG SL tablet Place 0.4 mg under the tongue every 5 (five) minutes as needed.        Marland Kitchen  nystatin (MYCOSTATIN) 100000 UNIT/ML suspension Take 5 mLs (500,000 Units total) by mouth 4 (four) times daily.  60 mL  0  . pantoprazole (PROTONIX) 40 MG tablet Take 40 mg by mouth 2 (two) times daily.       . roflumilast (DALIRESP) 500 MCG TABS tablet Take 500 mcg by mouth daily.        . sitaGLIPtan-metformin (JANUMET) 50-500 MG per tablet Take 1 tablet by mouth 2 (two) times daily with a meal.        . solifenacin (VESICARE) 5 MG tablet Take 10 mg by mouth daily.      . Tamsulosin HCl (FLOMAX) 0.4 MG CAPS Take 0.4 mg by mouth daily after supper.        . telmisartan (MICARDIS) 80 MG tablet Take 80 mg by mouth daily.       Marland Kitchen tiotropium (SPIRIVA) 18 MCG inhalation capsule Place 18 mcg into inhaler and inhale daily.        . vitamin C (ASCORBIC ACID) 500 MG  tablet Take 500 mg by mouth daily.       No current facility-administered medications on file prior to visit.    Allergies  Allergen Reactions  . Ciprofloxacin Hcl Other (See Comments)    Unknown   . Penicillins     unknown  . Sulfonamide Derivatives     Unknown     Physical Exam:  General - Thin HEENT - no sinus tenderness, no oral exudate, no LAN Cardiac - s1s2 regular, no murmur Chest - prolonged exhalation, no wheeze/rales Abdomen - soft, nontender Extremities - no edema Neurologic - normal strength, CN intact Psychiatric - normal mood, behavior   02/24/2013    CLINICAL DATA:  Shortness of breath and fever   EXAM: CHEST  2 VIEW   COMPARISON:  01/04/2013   FINDINGS:  Heart size and vascular pattern are normal. Left lung is clear. There is airspace opacification in the right lower lobe with air bronchograms present.   IMPRESSION:  Right lower lobe pneumonia    Electronically Signed   By: Skipper Cliche M.D.   On: 02/24/2013 10:50     Assessment/Plan:  Robert Mires, MD [place] 03/25/2013, 8:33 AM Pager:  [phone] After 3pm call: [phone]

## 2013-03-18 NOTE — Patient Instructions (Signed)
Will arrange for new CPAP mask >> call if this is not working better Follow up in 6 months

## 2013-03-25 NOTE — Assessment & Plan Note (Signed)
Clinically improved after recent episode of PNA in October 2014.  He will need f/u CXR to ensure clearing of infiltrate.  He would like to have this arranged through his PCP.

## 2013-03-25 NOTE — Assessment & Plan Note (Signed)
He is to continue spiriva, symbicort, daliresp, singulair, and prednisone.  He is to continue prn albuterol.  Will discuss at next visit whether he is getting benefit from continued use of singulair, and whether to d/c ICS from inhaler regimen since he is on chronic prednisone.

## 2013-03-25 NOTE — Assessment & Plan Note (Signed)
Will arrange for CPAP mask refit >> advised him to call if this does not improve his tolerance of CPAP.

## 2014-09-24 ENCOUNTER — Emergency Department (HOSPITAL_COMMUNITY)
Admission: EM | Admit: 2014-09-24 | Discharge: 2014-09-24 | Disposition: A | Discharge status: Home / Self Care | Payer: Medicare PPO | Attending: Emergency Medicine | Admitting: Emergency Medicine

## 2014-09-24 ENCOUNTER — Encounter (HOSPITAL_COMMUNITY): Payer: Self-pay | Admitting: Emergency Medicine

## 2014-09-24 ENCOUNTER — Emergency Department (HOSPITAL_COMMUNITY): Payer: Medicare PPO

## 2014-09-24 DIAGNOSIS — Z8673 Personal history of transient ischemic attack (TIA), and cerebral infarction without residual deficits: Secondary | ICD-10-CM | POA: Diagnosis not present

## 2014-09-24 DIAGNOSIS — Z8619 Personal history of other infectious and parasitic diseases: Secondary | ICD-10-CM | POA: Diagnosis not present

## 2014-09-24 DIAGNOSIS — J449 Chronic obstructive pulmonary disease, unspecified: Secondary | ICD-10-CM | POA: Insufficient documentation

## 2014-09-24 DIAGNOSIS — Z880 Allergy status to penicillin: Secondary | ICD-10-CM | POA: Insufficient documentation

## 2014-09-24 DIAGNOSIS — Y9389 Activity, other specified: Secondary | ICD-10-CM | POA: Diagnosis not present

## 2014-09-24 DIAGNOSIS — Z79899 Other long term (current) drug therapy: Secondary | ICD-10-CM | POA: Diagnosis not present

## 2014-09-24 DIAGNOSIS — Z7902 Long term (current) use of antithrombotics/antiplatelets: Secondary | ICD-10-CM | POA: Insufficient documentation

## 2014-09-24 DIAGNOSIS — E785 Hyperlipidemia, unspecified: Secondary | ICD-10-CM | POA: Insufficient documentation

## 2014-09-24 DIAGNOSIS — I10 Essential (primary) hypertension: Secondary | ICD-10-CM | POA: Diagnosis not present

## 2014-09-24 DIAGNOSIS — Z862 Personal history of diseases of the blood and blood-forming organs and certain disorders involving the immune mechanism: Secondary | ICD-10-CM | POA: Insufficient documentation

## 2014-09-24 DIAGNOSIS — N400 Enlarged prostate without lower urinary tract symptoms: Secondary | ICD-10-CM | POA: Diagnosis not present

## 2014-09-24 DIAGNOSIS — E119 Type 2 diabetes mellitus without complications: Secondary | ICD-10-CM | POA: Insufficient documentation

## 2014-09-24 DIAGNOSIS — Z7952 Long term (current) use of systemic steroids: Secondary | ICD-10-CM | POA: Diagnosis not present

## 2014-09-24 DIAGNOSIS — Y998 Other external cause status: Secondary | ICD-10-CM | POA: Diagnosis not present

## 2014-09-24 DIAGNOSIS — I2510 Atherosclerotic heart disease of native coronary artery without angina pectoris: Secondary | ICD-10-CM | POA: Diagnosis not present

## 2014-09-24 DIAGNOSIS — H903 Sensorineural hearing loss, bilateral: Secondary | ICD-10-CM | POA: Diagnosis not present

## 2014-09-24 DIAGNOSIS — Y92410 Unspecified street and highway as the place of occurrence of the external cause: Secondary | ICD-10-CM | POA: Insufficient documentation

## 2014-09-24 DIAGNOSIS — Z7982 Long term (current) use of aspirin: Secondary | ICD-10-CM | POA: Diagnosis not present

## 2014-09-24 DIAGNOSIS — Z794 Long term (current) use of insulin: Secondary | ICD-10-CM | POA: Diagnosis not present

## 2014-09-24 DIAGNOSIS — Z7951 Long term (current) use of inhaled steroids: Secondary | ICD-10-CM | POA: Diagnosis not present

## 2014-09-24 DIAGNOSIS — G454 Transient global amnesia: Secondary | ICD-10-CM | POA: Insufficient documentation

## 2014-09-24 DIAGNOSIS — Z043 Encounter for examination and observation following other accident: Secondary | ICD-10-CM | POA: Diagnosis not present

## 2014-09-24 DIAGNOSIS — R6889 Other general symptoms and signs: Secondary | ICD-10-CM | POA: Diagnosis not present

## 2014-09-24 DIAGNOSIS — Z87891 Personal history of nicotine dependence: Secondary | ICD-10-CM | POA: Insufficient documentation

## 2014-09-24 DIAGNOSIS — K219 Gastro-esophageal reflux disease without esophagitis: Secondary | ICD-10-CM | POA: Diagnosis not present

## 2014-09-24 DIAGNOSIS — R4182 Altered mental status, unspecified: Secondary | ICD-10-CM | POA: Diagnosis present

## 2014-09-24 DIAGNOSIS — F4489 Other dissociative and conversion disorders: Secondary | ICD-10-CM | POA: Diagnosis not present

## 2014-09-24 DIAGNOSIS — M1990 Unspecified osteoarthritis, unspecified site: Secondary | ICD-10-CM | POA: Insufficient documentation

## 2014-09-24 LAB — COMPREHENSIVE METABOLIC PANEL
ALT: 27 U/L (ref 17–63)
AST: 20 U/L (ref 15–41)
Albumin: 3.7 g/dL (ref 3.5–5.0)
Alkaline Phosphatase: 48 U/L (ref 38–126)
Anion gap: 9 (ref 5–15)
BILIRUBIN TOTAL: 0.8 mg/dL (ref 0.3–1.2)
BUN: 19 mg/dL (ref 6–20)
CALCIUM: 9 mg/dL (ref 8.9–10.3)
CO2: 25 mmol/L (ref 22–32)
Chloride: 106 mmol/L (ref 101–111)
Creatinine, Ser: 1.03 mg/dL (ref 0.61–1.24)
GFR calc Af Amer: >60 mL/min (ref >60)
Glucose, Bld: 123 mg/dL — ABNORMAL HIGH (ref 65–99)
Potassium: 4.7 mmol/L (ref 3.5–5.1)
SODIUM: 140 mmol/L (ref 135–145)
TOTAL PROTEIN: 6 g/dL — AB (ref 6.5–8.1)

## 2014-09-24 LAB — DIFFERENTIAL
Basophils Absolute: 0.1 10*3/uL (ref 0.0–0.1)
Basophils Relative: 0 % (ref 0–1)
EOS ABS: 1.1 10*3/uL — AB (ref 0.0–0.7)
EOS PCT: 10 % — AB (ref 0–5)
Lymphocytes Relative: 9 % — ABNORMAL LOW (ref 12–46)
Lymphs Abs: 1 10*3/uL (ref 0.7–4.0)
Monocytes Absolute: 0.7 10*3/uL (ref 0.1–1.0)
Monocytes Relative: 6 % (ref 3–12)
NEUTROS PCT: 75 % (ref 43–77)
Neutro Abs: 8.4 10*3/uL — ABNORMAL HIGH (ref 1.7–7.7)

## 2014-09-24 LAB — I-STAT TROPONIN, ED: Troponin i, poc: 0.01 ng/mL (ref 0.00–0.08)

## 2014-09-24 LAB — URINALYSIS, ROUTINE W REFLEX MICROSCOPIC
Bilirubin Urine: NEGATIVE
Glucose, UA: NEGATIVE mg/dL
HGB URINE DIPSTICK: NEGATIVE
Ketones, ur: NEGATIVE mg/dL
NITRITE: NEGATIVE
Protein, ur: NEGATIVE mg/dL
SPECIFIC GRAVITY, URINE: 1.012 (ref 1.005–1.030)
UROBILINOGEN UA: 0.2 mg/dL (ref 0.0–1.0)
pH: 7 (ref 5.0–8.0)

## 2014-09-24 LAB — CBC
HCT: 40.3 % (ref 39.0–52.0)
HEMOGLOBIN: 13.3 g/dL (ref 13.0–17.0)
MCH: 27.5 pg (ref 26.0–34.0)
MCHC: 33 g/dL (ref 30.0–36.0)
MCV: 83.4 fL (ref 78.0–100.0)
PLATELETS: 198 10*3/uL (ref 150–400)
RBC: 4.83 MIL/uL (ref 4.22–5.81)
RDW: 13.8 % (ref 11.5–15.5)
WBC: 11.2 10*3/uL — AB (ref 4.0–10.5)

## 2014-09-24 LAB — PROTIME-INR
INR: 1.05 (ref 0.00–1.49)
Prothrombin Time: 13.9 seconds (ref 11.6–15.2)

## 2014-09-24 LAB — APTT: aPTT: 26 seconds (ref 24–37)

## 2014-09-24 LAB — URINE MICROSCOPIC-ADD ON

## 2014-09-24 NOTE — ED Notes (Signed)
Bed: WA20 Expected date:  Expected time:  Means of arrival:  Comments: EMS 
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
## 2014-09-24 NOTE — Discharge Instructions (Signed)
Transient Global Amnesia Your exam shows you may have a rare problem that causes temporary amnesia, an inability to remember what has happened in the past several hours or day. Transient global amnesia (TGA) means you cannot remember recent events, even though you may look and act normally. There are no physical problems in TGA; your vision, strength, coordination, and sensations are all normal. TGA occurs most often in older patients, and in patients with high blood pressure. The exact cause of TGA is not known, although it is thought to be due to vascular disease in your brain. There is usually a complete return to normal memory capacity after an episode is over. About 20-30% of patients with TGA will have more than one episode, and some studies show a slight increased risk for stroke. Although no special treatment is needed, taking up to one adult aspirin daily reduces the risk of having a stroke. You should consider taking aspirin daily if you are not allergic to it. Medical evaluation may require specialized scans to check for stroke or other brain problems, an EEG (brain wave test), or blood tests. Avoid alcohol or any sedating medicines until you are completely recovered. Call your doctor right away if your memory is not fully recovered after 24 hours, or if you have any other serious problems including:  Severe headache, nausea, vomiting, fever, or other symptoms of an infection.  Weakness, numbness, difficulty with movement, or incoordination.  Blurred or double vision, unusual sleepiness, seizures, or fainting. Document Released: 05/26/2004 Document Revised: 07/11/2011 Document Reviewed: 04/18/2005 [facility] Patient Information 2015 [place], [place]. This information is not intended to replace advice given to you by your health care provider. Make sure you discuss any questions you have with your health care provider.

## 2014-09-26 DIAGNOSIS — G309 Alzheimer's disease, unspecified: Secondary | ICD-10-CM | POA: Insufficient documentation

## 2014-09-26 DIAGNOSIS — F0280 Dementia in other diseases classified elsewhere without behavioral disturbance: Secondary | ICD-10-CM | POA: Insufficient documentation

## 2014-10-31 DIAGNOSIS — G4733 Obstructive sleep apnea (adult) (pediatric): Secondary | ICD-10-CM | POA: Diagnosis not present

## 2014-10-31 DIAGNOSIS — Z6826 Body mass index (BMI) 26.0-26.9, adult: Secondary | ICD-10-CM | POA: Diagnosis not present

## 2014-10-31 DIAGNOSIS — Z9861 Coronary angioplasty status: Secondary | ICD-10-CM | POA: Diagnosis not present

## 2014-10-31 DIAGNOSIS — E1151 Type 2 diabetes mellitus with diabetic peripheral angiopathy without gangrene: Secondary | ICD-10-CM | POA: Diagnosis not present

## 2014-10-31 DIAGNOSIS — G309 Alzheimer's disease, unspecified: Secondary | ICD-10-CM | POA: Diagnosis not present

## 2014-10-31 DIAGNOSIS — I739 Peripheral vascular disease, unspecified: Secondary | ICD-10-CM | POA: Diagnosis not present

## 2014-12-15 DIAGNOSIS — E1151 Type 2 diabetes mellitus with diabetic peripheral angiopathy without gangrene: Secondary | ICD-10-CM | POA: Diagnosis not present

## 2014-12-15 DIAGNOSIS — J45909 Unspecified asthma, uncomplicated: Secondary | ICD-10-CM | POA: Diagnosis not present

## 2014-12-15 DIAGNOSIS — H1130 Conjunctival hemorrhage, unspecified eye: Secondary | ICD-10-CM | POA: Diagnosis not present

## 2014-12-15 DIAGNOSIS — Z1389 Encounter for screening for other disorder: Secondary | ICD-10-CM | POA: Diagnosis not present

## 2014-12-15 DIAGNOSIS — Z6829 Body mass index (BMI) 29.0-29.9, adult: Secondary | ICD-10-CM | POA: Diagnosis not present

## 2014-12-15 DIAGNOSIS — I739 Peripheral vascular disease, unspecified: Secondary | ICD-10-CM | POA: Diagnosis not present

## 2015-03-18 DIAGNOSIS — E1151 Type 2 diabetes mellitus with diabetic peripheral angiopathy without gangrene: Secondary | ICD-10-CM | POA: Diagnosis not present

## 2015-03-18 DIAGNOSIS — R8299 Other abnormal findings in urine: Secondary | ICD-10-CM | POA: Diagnosis not present

## 2015-03-18 DIAGNOSIS — M109 Gout, unspecified: Secondary | ICD-10-CM | POA: Diagnosis not present

## 2015-03-18 DIAGNOSIS — E782 Mixed hyperlipidemia: Secondary | ICD-10-CM | POA: Diagnosis not present

## 2015-03-18 DIAGNOSIS — I10 Essential (primary) hypertension: Secondary | ICD-10-CM | POA: Diagnosis not present

## 2015-03-18 DIAGNOSIS — Z125 Encounter for screening for malignant neoplasm of prostate: Secondary | ICD-10-CM | POA: Diagnosis not present

## 2015-03-25 DIAGNOSIS — I739 Peripheral vascular disease, unspecified: Secondary | ICD-10-CM | POA: Diagnosis not present

## 2015-03-25 DIAGNOSIS — Z0000 Encounter for general adult medical examination without abnormal findings: Secondary | ICD-10-CM | POA: Diagnosis not present

## 2015-03-25 DIAGNOSIS — J45909 Unspecified asthma, uncomplicated: Secondary | ICD-10-CM | POA: Diagnosis not present

## 2015-03-25 DIAGNOSIS — Z9861 Coronary angioplasty status: Secondary | ICD-10-CM | POA: Diagnosis not present

## 2015-03-25 DIAGNOSIS — E1151 Type 2 diabetes mellitus with diabetic peripheral angiopathy without gangrene: Secondary | ICD-10-CM | POA: Diagnosis not present

## 2015-03-25 DIAGNOSIS — H1130 Conjunctival hemorrhage, unspecified eye: Secondary | ICD-10-CM | POA: Diagnosis not present

## 2015-03-25 DIAGNOSIS — G309 Alzheimer's disease, unspecified: Secondary | ICD-10-CM | POA: Diagnosis not present

## 2015-03-25 DIAGNOSIS — M109 Gout, unspecified: Secondary | ICD-10-CM | POA: Diagnosis not present

## 2015-03-25 DIAGNOSIS — E782 Mixed hyperlipidemia: Secondary | ICD-10-CM | POA: Diagnosis not present

## 2015-04-04 DIAGNOSIS — G4733 Obstructive sleep apnea (adult) (pediatric): Secondary | ICD-10-CM | POA: Diagnosis not present

## 2015-04-15 ENCOUNTER — Ambulatory Visit: Payer: Medicare PPO | Admitting: Neurology

## 2015-04-22 ENCOUNTER — Encounter: Payer: Self-pay | Admitting: Neurology

## 2015-04-22 ENCOUNTER — Ambulatory Visit (INDEPENDENT_AMBULATORY_CARE_PROVIDER_SITE_OTHER): Payer: Medicare PPO | Admitting: Neurology

## 2015-04-22 ENCOUNTER — Encounter: Payer: Self-pay | Admitting: *Deleted

## 2015-04-22 VITALS — BP 148/75 | HR 89 | Ht 66.0 in | Wt 163.8 lb

## 2015-04-22 DIAGNOSIS — F0280 Dementia in other diseases classified elsewhere without behavioral disturbance: Secondary | ICD-10-CM | POA: Diagnosis not present

## 2015-04-22 DIAGNOSIS — G309 Alzheimer's disease, unspecified: Secondary | ICD-10-CM | POA: Diagnosis not present

## 2015-04-22 DIAGNOSIS — F0390 Unspecified dementia without behavioral disturbance: Secondary | ICD-10-CM | POA: Diagnosis not present
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
## 2015-04-22 NOTE — Patient Instructions (Signed)
Remember to drink plenty of fluid, eat healthy meals and do not skip any meals. Try to eat protein with a every meal and eat a healthy snack such as fruit or nuts in between meals. Try to keep a regular sleep-wake schedule and try to exercise daily, particularly in the form of walking, 20-30 minutes a day, if you can.   As far as your medications are concerned, I would like to suggest: aricept and Namenda  As far as diagnostic testing: labs  I would like to see you back in 6 months, sooner if we need to. Please call us with any interim questions, concerns, problems, updates or refill requests.    Our phone number is [phone]. We also have an after hours call service for urgent matters and there is a physician on-call for urgent questions. For any emergencies you know to call 911 or go to the nearest emergency room

## 2015-04-25 LAB — THYROID PANEL WITH TSH
Free Thyroxine Index: 2.5 (ref 1.2–4.9)
T3 Uptake Ratio: 30 % (ref 24–39)
T4, Total: 8.3 ug/dL (ref 4.5–12.0)
TSH: 2.84 u[IU]/mL (ref 0.450–4.500)

## 2015-04-25 LAB — METHYLMALONIC ACID, SERUM: Methylmalonic Acid: 202 nmol/L (ref 0–378)

## 2015-04-25 LAB — B12 AND FOLATE PANEL

## 2015-04-29 ENCOUNTER — Telehealth: Payer: Self-pay | Admitting: *Deleted

## 2015-04-29 NOTE — Telephone Encounter (Signed)
I spoke to pt and let him know that the lab results were normal.   He verbalized understanding.

## 2015-04-29 NOTE — Telephone Encounter (Signed)
-----   Message from Melvenia Beam, MD sent at 04/25/2015  8:38 AM EST ----- Labs normal thanks

## 2015-06-10 ENCOUNTER — Encounter: Payer: Self-pay | Admitting: Internal Medicine

## 2015-06-10 ENCOUNTER — Encounter: Payer: Self-pay | Admitting: Neurology

## 2015-07-15 ENCOUNTER — Encounter: Payer: Self-pay | Admitting: *Deleted

## 2015-07-15 NOTE — Progress Notes (Signed)
Faxed Dr Jaynee Eagles office note from 04/22/15 as requested to Dr Philip Aspen, MD. Received confirmation. Fax: [phone].

## 2015-10-07 ENCOUNTER — Encounter: Payer: Self-pay | Admitting: Neurology

## 2015-10-07 ENCOUNTER — Ambulatory Visit (INDEPENDENT_AMBULATORY_CARE_PROVIDER_SITE_OTHER): Payer: Medicare Other | Admitting: Neurology

## 2015-10-07 VITALS — BP 168/87 | HR 101 | Ht 66.0 in | Wt 162.8 lb

## 2015-10-07 DIAGNOSIS — G309 Alzheimer's disease, unspecified: Secondary | ICD-10-CM

## 2015-10-07 DIAGNOSIS — F0280 Dementia in other diseases classified elsewhere without behavioral disturbance: Secondary | ICD-10-CM

## 2015-10-07 NOTE — Patient Instructions (Signed)
Remember to drink plenty of fluid, eat healthy meals and do not skip any meals. Try to eat protein with a every meal and eat a healthy snack such as fruit or nuts in between meals. Try to keep a regular sleep-wake schedule and try to exercise daily, particularly in the form of walking, 20-30 minutes a day, if you can.   As far as your medications are concerned, I would like to suggest: Increase Aricept to 10mg  a day Vitamin D supplementation 1000-2000D3 daily  I would like to see you back in 6 - 9 months, sooner if we need to. Please call us with any interim questions, concerns, problems, updates or refill requests.   Our phone number is [phone]. We also have an after hours call service for urgent matters and there is a physician on-call for urgent questions. For any emergencies you know to call 911 or go to the nearest emergency room
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
## 2015-10-10 DIAGNOSIS — F0280 Dementia in other diseases classified elsewhere without behavioral disturbance: Secondary | ICD-10-CM | POA: Insufficient documentation

## 2015-10-10 DIAGNOSIS — G309 Alzheimer's disease, unspecified: Principal | ICD-10-CM

## 2015-10-11 ENCOUNTER — Telehealth: Payer: Self-pay | Admitting: Neurology

## 2015-10-11 NOTE — Telephone Encounter (Signed)
I called patient's son, Einar Grad. He couldn't make it to the last appointment. His brother who was there said he wanted to talk to me. I left a message, left my cell phone number to get back to me as needed. thanks

## 2015-12-15 ENCOUNTER — Other Ambulatory Visit: Payer: Self-pay | Admitting: Urology

## 2016-02-22 NOTE — Patient Instructions (Addendum)
[name]  02/22/2016   Your procedure is scheduled on: 02-25-16  Report to [facility] Main  Entrance take [facility]  elevators to 3rd floor to  [place] at   0700 AM.  Call this number if you have problems the morning of surgery [phone]  Cpap use -bring mask/tubing.  Remember: ONLY 1 PERSON MAY GO WITH YOU TO SHORT STAY TO GET  READY MORNING OF YOUR SURGERY.  Do not eat food or drink liquids :After Midnight.     Take these medicines the morning of surgery with A SIP OF WATER:  Amlodipine. Atorvastatin. Loratadine. Pantoprazole. Montelukast. Prednisone. Inhalers-usual. Ask MD on Plavix use. DO NOT TAKE ANY DIABETIC MEDICATIONS DAY OF YOUR SURGERY                               You may not have any metal on your body including hair pins and              piercings  Do not wear jewelry, make-up, lotions, powders or perfumes, deodorant             Do not wear nail polish.  Do not shave  48 hours prior to surgery.              Men may shave face and neck.   Do not bring valuables to the hospital. [place].  Contacts, dentures or bridgework may not be worn into surgery.  Leave suitcase in the car. After surgery it may be brought to your room.     Patients discharged the day of surgery will not be allowed to drive home.  Name and phone number of your driver:Robert Creson,MD -son [phone] cell  How to Manage Your Diabetes Before and After Surgery  Why is it important to control my blood sugar before and after surgery? . Improving blood sugar levels before and after surgery helps healing and can limit problems. . A way of improving blood sugar control is eating a healthy diet by: o  Eating less sugar and carbohydrates o  Increasing activity/exercise o  Talking with your doctor about reaching your blood sugar goals . High blood sugars (greater than 180 mg/dL) can raise your risk of infections and  slow your recovery, so you will need to focus on controlling your diabetes during the weeks before surgery. . Make sure that the doctor who takes care of your diabetes knows about your planned surgery including the date and location.  How do I manage my blood sugar before surgery? . Check your blood sugar at least 4 times a day, starting 2 days before surgery, to make sure that the level is not too high or low. o Check your blood sugar the morning of your surgery when you wake up and every 2 hours until you get to the Short Stay unit. . If your blood sugar is less than 70 mg/dL, you will need to treat for low blood sugar: o Do not take insulin. o Treat a low blood sugar (less than 70 mg/dL) with  cup of clear juice (cranberry or apple), 4 glucose tablets, OR glucose gel. o Recheck blood sugar in 15 minutes after treatment (to make sure it is greater  than 70 mg/dL). If your blood sugar is not greater than 70 mg/dL on recheck, call [phone] for further instructions. . Report your blood sugar to the short stay nurse when you get to Short Stay.  . If you are admitted to the hospital after surgery: o Your blood sugar will be checked by the staff and you will probably be given insulin after surgery (instead of oral diabetes medicines) to make sure you have good blood sugar levels. o The goal for blood sugar control after surgery is 80-180 mg/dL.   WHAT DO I DO ABOUT MY DIABETES MEDICATION? IF YOU HAVE AN INSULIN PUMP BRING YOUR OWN INSULIN PUMP SUPPLIES TO THE HOSPITAL.   Marland Kitchen Do not take oral diabetes medicines (pills) the morning of surgery.(Take Janumet only PM before surgery if eating normal).  . THE NIGHT BEFORE SURGERY, take __________ units of ___________insulin. NONE.      . THE MORNING OF SURGERY, take _____________ units of __________insulin.   NONE.  . The day of surgery, do not take other diabetes injectables, including Byetta (exenatide), Bydureon (exenatide ER), Victoza  (liraglutide), or Trulicity (dulaglutide).  . If your CBG is greater than 220 mg/dL, you may take  of your sliding scale (correction) dose of insulin.  Patient Signature:  Date:   Nurse Signature:  Date:   Reviewed and Endorsed by [facility] Patient Education Committee, August 2015  Special Instructions: N/A              Please read over the following fact sheets you were given: _____________________________________________________________________             [facility] - Preparing for Surgery Before surgery, you can play an important role.  Because skin is not sterile, your skin needs to be as free of germs as possible.  You can reduce the number of germs on your skin by washing with CHG (chlorahexidine gluconate) soap before surgery.  CHG is an antiseptic cleaner which kills germs and bonds with the skin to continue killing germs even after washing. Please DO NOT use if you have an allergy to CHG or antibacterial soaps.  If your skin becomes reddened/irritated stop using the CHG and inform your nurse when you arrive at Short Stay. Do not shave (including legs and underarms) for at least 48 hours prior to the first CHG shower.  You may shave your face/neck. Please follow these instructions carefully:  1.  Shower with CHG Soap the night before surgery and the  morning of Surgery.  2.  If you choose to wash your hair, wash your hair first as usual with your  normal  shampoo.  3.  After you shampoo, rinse your hair and body thoroughly to remove the  shampoo.                           4.  Use CHG as you would any other liquid soap.  You can apply chg directly  to the skin and wash                       Gently with a scrungie or clean washcloth.  5.  Apply the CHG Soap to your body ONLY FROM THE NECK DOWN.   Do not use on face/ open                           Wound or open  sores. Avoid contact with eyes, ears mouth and genitals (private parts).                       Wash face,  Genitals  (private parts) with your normal soap.             6.  Wash thoroughly, paying special attention to the area where your surgery  will be performed.  7.  Thoroughly rinse your body with warm water from the neck down.  8.  DO NOT shower/wash with your normal soap after using and rinsing off  the CHG Soap.                9.  Pat yourself dry with a clean towel.            10.  Wear clean pajamas.            11.  Place clean sheets on your bed the night of your first shower and do not  sleep with pets. Day of Surgery : Do not apply any lotions/deodorants the morning of surgery.  Please wear clean clothes to the hospital/surgery center.  FAILURE TO FOLLOW THESE INSTRUCTIONS MAY RESULT IN THE CANCELLATION OF YOUR SURGERY NURSE SIGNATURE__________________________________  ________________________________________________________________________

## 2016-02-23 ENCOUNTER — Encounter (HOSPITAL_COMMUNITY)
Admission: RE | Admit: 2016-02-23 | Discharge: 2016-02-23 | Disposition: A | Discharge status: Home / Self Care | Payer: Medicare Other | Source: Ambulatory Visit | Attending: Urology | Admitting: Urology

## 2016-02-23 ENCOUNTER — Encounter (HOSPITAL_COMMUNITY): Payer: Self-pay

## 2016-02-23 DIAGNOSIS — E119 Type 2 diabetes mellitus without complications: Secondary | ICD-10-CM | POA: Diagnosis not present

## 2016-02-23 DIAGNOSIS — I10 Essential (primary) hypertension: Secondary | ICD-10-CM | POA: Diagnosis not present

## 2016-02-23 DIAGNOSIS — D291 Benign neoplasm of prostate: Secondary | ICD-10-CM | POA: Diagnosis not present

## 2016-02-23 DIAGNOSIS — Z01818 Encounter for other preprocedural examination: Secondary | ICD-10-CM | POA: Insufficient documentation

## 2016-02-23 LAB — CBC
HCT: 36.1 % — ABNORMAL LOW (ref 39.0–52.0)
Hemoglobin: 12.1 g/dL — ABNORMAL LOW (ref 13.0–17.0)
MCH: 27.4 pg (ref 26.0–34.0)
MCHC: 33.5 g/dL (ref 30.0–36.0)
MCV: 81.7 fL (ref 78.0–100.0)
PLATELETS: 198 10*3/uL (ref 150–400)
RBC: 4.42 MIL/uL (ref 4.22–5.81)
RDW: 13.3 % (ref 11.5–15.5)
WBC: 10.4 10*3/uL (ref 4.0–10.5)

## 2016-02-23 LAB — BASIC METABOLIC PANEL
Anion gap: 12 (ref 5–15)
BUN: 18 mg/dL (ref 6–20)
CALCIUM: 9.5 mg/dL (ref 8.9–10.3)
CHLORIDE: 104 mmol/L (ref 101–111)
CO2: 23 mmol/L (ref 22–32)
CREATININE: 1.36 mg/dL — AB (ref 0.61–1.24)
GFR calc non Af Amer: 46 mL/min — ABNORMAL LOW (ref >60)
GFR, EST AFRICAN AMERICAN: 53 mL/min — AB (ref >60)
Glucose, Bld: 215 mg/dL — ABNORMAL HIGH (ref 65–99)
Potassium: 3.9 mmol/L (ref 3.5–5.1)
SODIUM: 139 mmol/L (ref 135–145)

## 2016-02-23 LAB — GLUCOSE, CAPILLARY: GLUCOSE-CAPILLARY: 218 mg/dL — AB (ref 65–99)

## 2016-02-24 LAB — HEMOGLOBIN A1C
HEMOGLOBIN A1C: 8.3 % — AB (ref 4.8–5.6)
Mean Plasma Glucose: 192 mg/dL

## 2016-02-24 NOTE — Pre-Procedure Instructions (Signed)
EKG done today. Clearance 02-12-16 /LOV notes with chart. CXR 06-09-14 with chart. Hgb A1C level = 7.9(10-26-15) with chart.

## 2016-02-24 NOTE — Progress Notes (Signed)
02-25-16 0850 Hgb A1C = 8.3, viewable in Epic.

## 2016-02-25 ENCOUNTER — Ambulatory Visit (HOSPITAL_COMMUNITY): Payer: Medicare Other | Admitting: Anesthesiology

## 2016-02-25 ENCOUNTER — Encounter (HOSPITAL_COMMUNITY)
Admission: RE | Disposition: A | Discharge status: Home / Self Care | Payer: Self-pay | Source: Ambulatory Visit | Attending: Urology

## 2016-02-25 ENCOUNTER — Encounter (HOSPITAL_COMMUNITY): Payer: Self-pay | Admitting: *Deleted

## 2016-02-25 ENCOUNTER — Ambulatory Visit (HOSPITAL_COMMUNITY)
Admission: RE | Admit: 2016-02-25 | Discharge: 2016-02-25 | Disposition: A | Discharge status: Home / Self Care | Payer: Medicare Other | Source: Ambulatory Visit | Attending: Urology | Admitting: Urology

## 2016-02-25 DIAGNOSIS — Z7984 Long term (current) use of oral hypoglycemic drugs: Secondary | ICD-10-CM | POA: Insufficient documentation

## 2016-02-25 DIAGNOSIS — N138 Other obstructive and reflux uropathy: Secondary | ICD-10-CM | POA: Insufficient documentation

## 2016-02-25 DIAGNOSIS — J449 Chronic obstructive pulmonary disease, unspecified: Secondary | ICD-10-CM | POA: Diagnosis not present

## 2016-02-25 DIAGNOSIS — Z881 Allergy status to other antibiotic agents status: Secondary | ICD-10-CM | POA: Insufficient documentation

## 2016-02-25 DIAGNOSIS — Z882 Allergy status to sulfonamides status: Secondary | ICD-10-CM | POA: Insufficient documentation

## 2016-02-25 DIAGNOSIS — Z8673 Personal history of transient ischemic attack (TIA), and cerebral infarction without residual deficits: Secondary | ICD-10-CM | POA: Diagnosis not present

## 2016-02-25 DIAGNOSIS — Z888 Allergy status to other drugs, medicaments and biological substances status: Secondary | ICD-10-CM | POA: Diagnosis not present

## 2016-02-25 DIAGNOSIS — D649 Anemia, unspecified: Secondary | ICD-10-CM | POA: Insufficient documentation

## 2016-02-25 DIAGNOSIS — K449 Diaphragmatic hernia without obstruction or gangrene: Secondary | ICD-10-CM | POA: Diagnosis not present

## 2016-02-25 DIAGNOSIS — G4733 Obstructive sleep apnea (adult) (pediatric): Secondary | ICD-10-CM | POA: Insufficient documentation

## 2016-02-25 DIAGNOSIS — R3915 Urgency of urination: Secondary | ICD-10-CM | POA: Diagnosis not present

## 2016-02-25 DIAGNOSIS — E119 Type 2 diabetes mellitus without complications: Secondary | ICD-10-CM | POA: Diagnosis not present

## 2016-02-25 DIAGNOSIS — N401 Enlarged prostate with lower urinary tract symptoms: Secondary | ICD-10-CM | POA: Diagnosis not present

## 2016-02-25 DIAGNOSIS — M1990 Unspecified osteoarthritis, unspecified site: Secondary | ICD-10-CM | POA: Diagnosis not present

## 2016-02-25 DIAGNOSIS — K219 Gastro-esophageal reflux disease without esophagitis: Secondary | ICD-10-CM | POA: Diagnosis not present

## 2016-02-25 DIAGNOSIS — Z7982 Long term (current) use of aspirin: Secondary | ICD-10-CM | POA: Insufficient documentation

## 2016-02-25 DIAGNOSIS — Z880 Allergy status to penicillin: Secondary | ICD-10-CM | POA: Insufficient documentation

## 2016-02-25 DIAGNOSIS — Z87891 Personal history of nicotine dependence: Secondary | ICD-10-CM | POA: Diagnosis not present

## 2016-02-25 DIAGNOSIS — I2510 Atherosclerotic heart disease of native coronary artery without angina pectoris: Secondary | ICD-10-CM | POA: Insufficient documentation

## 2016-02-25 DIAGNOSIS — N3281 Overactive bladder: Secondary | ICD-10-CM | POA: Diagnosis not present

## 2016-02-25 DIAGNOSIS — E785 Hyperlipidemia, unspecified: Secondary | ICD-10-CM | POA: Diagnosis not present

## 2016-02-25 DIAGNOSIS — I10 Essential (primary) hypertension: Secondary | ICD-10-CM | POA: Diagnosis not present

## 2016-02-25 DIAGNOSIS — Z955 Presence of coronary angioplasty implant and graft: Secondary | ICD-10-CM | POA: Insufficient documentation

## 2016-02-25 HISTORY — PX: CYSTOSCOPY WITH INSERTION OF UROLIFT: SHX6678

## 2016-02-25 LAB — GLUCOSE, CAPILLARY: GLUCOSE-CAPILLARY: 137 mg/dL — AB (ref 65–99)

## 2016-02-25 SURGERY — CYSTOSCOPY WITH INSERTION OF UROLIFT
Anesthesia: General

## 2016-02-25 MED ORDER — LACTATED RINGERS IV SOLN
INTRAVENOUS | Status: DC
  Administered 2016-02-25: 1000 mL via INTRAVENOUS

## 2016-02-25 MED ORDER — LIDOCAINE 2% (20 MG/ML) 5 ML SYRINGE
INTRAMUSCULAR | Status: AC
  Filled 2016-02-25: qty 5

## 2016-02-25 MED ORDER — CLOPIDOGREL BISULFATE 75 MG PO TABS: 75.0000 mg | ORAL_TABLET | Freq: Every day | ORAL | 0 refills | Status: DC

## 2016-02-25 MED ORDER — CEPHALEXIN 500 MG PO CAPS: 500.0000 mg | ORAL_CAPSULE | Freq: Two times a day (BID) | ORAL | 0 refills | Status: DC

## 2016-02-25 MED ORDER — SODIUM CHLORIDE 0.9 % IR SOLN
Status: DC | PRN
  Administered 2016-02-25: 3000 mL via INTRAVESICAL

## 2016-02-25 MED ORDER — STERILE WATER FOR IRRIGATION IR SOLN
Status: DC | PRN
  Administered 2016-02-25: 2000 mL

## 2016-02-25 MED ORDER — ONDANSETRON HCL 4 MG/2ML IJ SOLN
INTRAMUSCULAR | Status: AC
  Filled 2016-02-25: qty 2

## 2016-02-25 MED ORDER — PROMETHAZINE HCL 25 MG/ML IJ SOLN: 6.2500 mg | INTRAMUSCULAR | Status: DC | PRN

## 2016-02-25 MED ORDER — FENTANYL CITRATE (PF) 100 MCG/2ML IJ SOLN
INTRAMUSCULAR | Status: AC
  Filled 2016-02-25: qty 2

## 2016-02-25 MED ORDER — PROPOFOL 10 MG/ML IV BOLUS
INTRAVENOUS | Status: DC | PRN
  Administered 2016-02-25: 10 mg via INTRAVENOUS
  Administered 2016-02-25: 30 mg via INTRAVENOUS
  Administered 2016-02-25: 20 mg via INTRAVENOUS

## 2016-02-25 MED ORDER — CEFAZOLIN SODIUM-DEXTROSE 2-4 GM/100ML-% IV SOLN
2.0000 g | INTRAVENOUS | Status: AC
  Administered 2016-02-25: 2 g via INTRAVENOUS

## 2016-02-25 MED ORDER — PROPOFOL 500 MG/50ML IV EMUL
INTRAVENOUS | Status: DC | PRN
Start: 2016-02-25 — End: 2016-02-25
  Administered 2016-02-25: 50 ug/kg/min via INTRAVENOUS

## 2016-02-25 MED ORDER — FENTANYL CITRATE (PF) 100 MCG/2ML IJ SOLN
INTRAMUSCULAR | Status: DC | PRN
  Administered 2016-02-25: 50 ug via INTRAVENOUS
  Administered 2016-02-25: 25 ug via INTRAVENOUS

## 2016-02-25 MED ORDER — PROPOFOL 10 MG/ML IV BOLUS
INTRAVENOUS | Status: AC
  Filled 2016-02-25: qty 20

## 2016-02-25 MED ORDER — CEFAZOLIN SODIUM-DEXTROSE 2-4 GM/100ML-% IV SOLN
INTRAVENOUS | Status: AC
  Filled 2016-02-25: qty 100

## 2016-02-25 MED ORDER — FENTANYL CITRATE (PF) 100 MCG/2ML IJ SOLN
25.0000 ug | INTRAMUSCULAR | Status: DC | PRN
  Administered 2016-02-25: 50 ug via INTRAVENOUS

## 2016-02-25 MED ORDER — TRAMADOL HCL 50 MG PO TABS: 50.0000 mg | ORAL_TABLET | Freq: Four times a day (QID) | ORAL | 0 refills | Status: DC | PRN

## 2016-02-25 SURGICAL SUPPLY — 11 items
BAG URINE DRAINAGE (UROLOGICAL SUPPLIES) ×2 IMPLANT
BAG URO CATCHER STRL LF (MISCELLANEOUS) ×2 IMPLANT
CATH FOLEY 2WAY SLVR  5CC 16FR (CATHETERS) ×1
CATH FOLEY 2WAY SLVR 5CC 16FR (CATHETERS) ×1 IMPLANT
GLOVE BIO SURGEON STRL SZ8 (GLOVE) ×2 IMPLANT
GOWN STRL REUS W/TWL XL LVL3 (GOWN DISPOSABLE) ×4 IMPLANT
HOLDER FOLEY CATH W/STRAP (MISCELLANEOUS) ×2 IMPLANT
MANIFOLD NEPTUNE II (INSTRUMENTS) ×2 IMPLANT
PACK CYSTO (CUSTOM PROCEDURE TRAY) ×2 IMPLANT
SYSTEM UROLIFT (Male Continence) ×12 IMPLANT
TUBING CONNECTING 10 (TUBING) ×2 IMPLANT

## 2016-02-25 NOTE — Anesthesia Preprocedure Evaluation (Addendum)
Anesthesia Evaluation  Patient identified by MRN, date of birth, ID band Patient awake    Reviewed: Allergy & Precautions, NPO status , Patient's Chart, lab work & pertinent test results  Airway Mallampati: II  TM Distance: >3 FB Neck ROM: Full    Dental no notable dental hx.    Pulmonary asthma , sleep apnea , pneumonia, COPD, former smoker,    Pulmonary exam normal breath sounds clear to auscultation       Cardiovascular hypertension, + CAD  Normal cardiovascular exam Rhythm:Regular Rate:Normal     Neuro/Psych PSYCHIATRIC DISORDERS TIA Neuromuscular disease CVA    GI/Hepatic Neg liver ROS, hiatal hernia, GERD  ,  Endo/Other  diabetes  Renal/GU negative Renal ROS  negative genitourinary   Musculoskeletal  (+) Arthritis ,   Abdominal   Peds negative pediatric ROS (+)  Hematology negative hematology ROS (+) anemia ,   Anesthesia Other Findings   Reproductive/Obstetrics negative OB ROS                             Anesthesia Physical Anesthesia Plan  ASA: III  Anesthesia Plan: MAC   Post-op Pain Management:    Induction: Intravenous  Airway Management Planned: Natural Airway  Additional Equipment:   Intra-op Plan:   Post-operative Plan:   Informed Consent: I have reviewed the patients History and Physical, chart, labs and discussed the procedure including the risks, benefits and alternatives for the proposed anesthesia with the patient or authorized representative who has indicated his/her understanding and acceptance.   Dental advisory given  Plan Discussed with: CRNA  Anesthesia Plan Comments: (Posted choice.  On recent plavix, so no spinal candidate. Plan MAC with GA backup. Discussed with Mr. Alligood and his son [name].)       Anesthesia Quick Evaluation

## 2016-02-25 NOTE — Anesthesia Postprocedure Evaluation (Signed)
Anesthesia Post Note  Patient: [name]  Procedure(s) Performed: Procedure(s) (LRB): CYSTOSCOPY WITH INSERTION OF UROLIFT x6 (N/A)  Patient location during evaluation: PACU Anesthesia Type: MAC Level of consciousness: awake and alert Pain management: pain level controlled Vital Signs Assessment: post-procedure vital signs reviewed and stable Respiratory status: spontaneous breathing, nonlabored ventilation, respiratory function stable and patient connected to nasal cannula oxygen Cardiovascular status: stable and blood pressure returned to baseline Anesthetic complications: no    Last Vitals:  Vitals:   02/25/16 0954 02/25/16 1044  BP: (!) 150/71 (!) 160/82  Pulse:    Resp: 16   Temp: 36.6 C 36.5 C    Last Pain:  Vitals:   02/25/16 0721  TempSrc: Oral                 Tangie Stay J

## 2016-02-25 NOTE — Discharge Instructions (Signed)
Foley Catheter Care, Adult    A Foley catheter is a soft, flexible tube. This tube is placed into your bladder to drain pee (urine). If you go home with this catheter in place, follow the instructions below.  TAKING CARE OF THE CATHETER  1. Wash your hands with soap and water.  2. Put soap and water on a clean washcloth.  ¨ Clean the skin where the tube goes into your body.  § Clean away from the tube site.  § Never wipe toward the tube.  § Clean the area using a circular motion.  ¨ Remove all the soap. Pat the area dry with a clean towel. For males, reposition the skin that covers the end of the penis (foreskin).  3. Attach the tube to your leg with tape or a leg strap. Do not stretch the tube tight. If you are using tape, remove any stickiness left behind by past tape you used.  4. Keep the drainage bag below your hips. Keep it off the floor.  5. Check your tube during the day. Make sure it is working and draining. Make sure the tube does not curl, twist, or bend.  6. Do not pull on the tube or try to take it out.  TAKING CARE OF THE DRAINAGE BAGS    You will have a large overnight drainage bag and a small leg bag. You may wear the overnight bag any time. Never wear the small bag at night. Follow the directions below.  Emptying the Drainage Bag  Empty your drainage bag when it is ?-½ full or at least 2-3 times a day.  1. Wash your hands with soap and water.  2. Keep the drainage bag below your hips.  3. Hold the dirty bag over the toilet or clean container.  4. Open the pour spout at the bottom of the bag. Empty the pee into the toilet or container. Do not let the pour spout touch anything.  5. Clean the pour spout with a gauze pad or cotton ball that has rubbing alcohol on it.  6. Close the pour spout.  7. Attach the bag to your leg with tape or a leg strap.  8. Wash your hands well.  Changing the Drainage Bag  Change your bag once a month or sooner if it starts to smell or look dirty.   1. Wash your hands with  soap and water.  2. Pinch the rubber tube so that pee does not spill out.  3. Disconnect the catheter tube from the drainage tube at the connection valve. Do not let the tubes touch anything.  4. Clean the end of the catheter tube with an alcohol wipe. Clean the end of a the drainage tube with a different alcohol wipe.  5. Connect the catheter tube to the drainage tube of the clean drainage bag.  6. Attach the new bag to the leg with tape or a leg strap. Avoid attaching the new bag too tightly.  7. Wash your hands well.  Cleaning the Drainage Bag  1. Wash your hands with soap and water.  2. Wash the bag in warm, soapy water.  3. Rinse the bag with warm water.  4. Fill the bag with a mixture of white vinegar and water (1 cup vinegar to 1 quart warm water [.2 liter vinegar to 1 liter warm water]). Close the bag and soak it for 30 minutes in the solution.  5. Rinse the bag with warm water.  6.   Hang the bag to dry with the pour spout open and hanging downward.  7. Store the clean bag (once it is dry) in a clean plastic bag.  8. Wash your hands well.  PREVENT INFECTION  · Wash your hands before and after touching your tube.  · Take showers every day. Wash the skin where the tube enters your body. Do not take baths. Replace wet leg straps with dry ones, if this applies.  · Do not use powders, sprays, or lotions on the genital area. Only use creams, lotions, or ointments as told by your doctor.  · For females, wipe from front to back after going to the bathroom.  · Drink enough fluids to keep your pee clear or pale yellow unless you are told not to have too much fluid (fluid restriction).  · Do not let the drainage bag or tubing touch or lie on the floor.  · Wear cotton underwear to keep the area dry.  GET HELP IF:  · Your pee is cloudy or smells unusually bad.  · Your tube becomes clogged.  · You are not draining pee into the bag or your bladder feels full.  · Your tube starts to leak.  GET HELP RIGHT AWAY IF:  · You have  pain, puffiness (swelling), redness, or yellowish-white fluid (pus) where the tube enters the body.  · You have pain in the belly (abdomen), legs, lower back, or bladder.  · You have a fever.  · You see blood fill the tube, or your pee is pink or red.  · You feel sick to your stomach (nauseous), throw up (vomit), or have chills.  · Your tube gets pulled out.  MAKE SURE YOU:   · Understand these instructions.  · Will watch your condition.  · Will get help right away if you are not doing well or get worse.     This information is not intended to replace advice given to you by your health care provider. Make sure you discuss any questions you have with your health care provider.     Document Released: 08/13/2012 Document Revised: 05/09/2014 Document Reviewed: 08/13/2012  Elsevier Interactive Patient Education ©2016 [employer]   

## 2016-02-25 NOTE — Transfer of Care (Signed)
Immediate Anesthesia Transfer of Care Note  Patient: [name]  Procedure(s) Performed: Procedure(s) with comments: CYSTOSCOPY WITH INSERTION OF UROLIFT x6 (N/A) - 1 HOUR    Patient Location: PACU  Anesthesia Type:MAC  Level of Consciousness: awake, alert  and oriented  Airway & Oxygen Therapy: Patient Spontanous Breathing and Patient connected to face mask oxygen  Post-op Assessment: Report given to RN and Post -op Vital signs reviewed and stable  Post vital signs: Reviewed and stable  Last Vitals:  Vitals:   02/25/16 0721  BP: (!) 137/58  Pulse: 96  Resp: 18  Temp: 36.9 C    Last Pain:  Vitals:   02/25/16 0721  TempSrc: Oral      Patients Stated Pain Goal: 3 (78/71/83 6725)  Complications: No apparent anesthesia complications

## 2016-02-25 NOTE — Op Note (Signed)
   PREOPERATIVE DIAGNOSIS: Benign prostatic hypertrophy with bladder outlet obstruction. Urinary urgency  POSTOPERATIVE DIAGNOSIS: Benign prostatic hypertrophy with bladder outlet obstruction. Urinary urgency  PROCEDURE: Cystoscopy with implantation of UroLift devices, 6 implants.  SURGEON: Nicolette Bang, M.D.  RESIDENT: Cleotis Lema, M.D.  ANESTHESIA:  MAC  ANTIBIOTICS: Ancef  SPECIMEN: None.  DRAINS: A 16-French Foley catheter.  BLOOD LOSS: Minimal.  COMPLICATIONS: None.  INDICATIONS:The Patient is an 82 year old white male with BPH and bladder outlet obstruction. He has failed medical therapy and has elected UroLift for definitive treatment.  FINDINGS OF PROCEDURE: He was taken to the operating room where a MAC anesthetic was induced. He was placed in lithotomy position and was fitted with PAS hose. His perineum and genitalia were prepped with chlorhexidine, and he was draped in usual sterile fashion.  Cystoscopy was performed using the UroLift scope and 0 degree lens. Examination revealed a normal urethra. The external sphincter was intact. Prostatic urethra was approximately 6 cm in length with lateral lobe enlargement. There was also little bit of bladder neck elevation. Inspection of bladder revealed mild-to-moderate trabeculation with no tumors, stones, or inflammation. No cellules or diverticula were noted. Ureteral orifices were in their normal anatomic position effluxing clear urine.  After initial cystoscopy, the visual obturator was replaced with the first UroLift device. This was turned to the 9 o'clock position and pulled back to the veru and then slightly advanced. Pressure was then applied to the right lateral lobe and the UroLift device was deployed.  The second UroLift device was then inserted and applied to the left lateral lobe at 3 o'clock and deployed in the mid prostatic urethra. After this, there was still some apparent  obstruction closer to the bladder neck. So a second level of UroLift your left device was applied between the mid urethra and the proximal urethra providing further patency to the prostatic urethra.  So it was thought that a Foley catheter was indicated. The scope was removed and a 16-French Foley catheter was inserted without difficulty. The balloon was filled with 10 mL sterile fluid, and the catheter was placed to straight drainage.  COMPLICATIONS: None   CONDITION: Stable, extubated, transferred to PACU  PLAN: The patient will be discharged home and followup in 1 days for a voiding trial.

## 2016-02-25 NOTE — H&P (Signed)
Urology Admission H&P  Chief Complaint: BPH  History of Present Illness: [name] is a 82yo with BPH w LUTS with urgency/frequency here for Urolift.   Past Medical History:  Diagnosis Date  . Anemia   . Asthma   . Benign prostatic hypertrophy   . Bilateral sensorineural hearing loss   . CAD (coronary artery disease) 07/2003   a.  3.0 x 28 mm Taxus DES in the mid RCA in 2005  . COPD (chronic obstructive pulmonary disease) ([place])   . Diverticulosis   . DJD (degenerative joint disease)   . DM (diabetes mellitus) ([place])   . Dyslipidemia   . GERD (gastroesophageal reflux disease)   . Hemorrhoids   . Hiatal hernia   . Hypertension   . OSA (obstructive sleep apnea)    cpap use  . Osteoarthritis   . Overactive bladder   . Rhinitis   . Seasonal allergies   . Shingles   . Stroke ([place])   . TIA (transient ischemic attack) 12/2006   Dr. Leonie Man   Past Surgical History:  Procedure Laterality Date  . BACK SURGERY     lower back  . Cataract surgery     1 eye done.  . CORONARY ANGIOPLASTY WITH STENT PLACEMENT  07/2003   Est. EF of 65% with stent to the RCA, by Dr. Acie Fredrickson  . INNER EAR SURGERY    . JOINT REPLACEMENT Right   . L5 laminectomy  2004   Dr. Sherwood Gambler  . right total knee  2006   Dr. Maureen Ralphs    Home Medications:  Prescriptions Prior to Admission  Medication Sig Dispense Refill Last Dose  . acetaminophen (TYLENOL) 500 MG tablet Take 500 mg by mouth every 6 (six) hours as needed.   Taking  . albuterol (PROVENTIL) (2.5 MG/3ML) 0.083% nebulizer solution Take 0.83 mg by nebulization 3 (three) times daily.    02/25/2016 at Unknown time  . amLODipine (NORVASC) 5 MG tablet Take 5 mg by mouth daily.   02/25/2016 at Unknown time  . aspirin 81 MG tablet Take 81 mg by mouth every evening.    02/22/2016  . atorvastatin (LIPITOR) 20 MG tablet Take 20 mg by mouth daily.     02/25/2016 at Unknown time  . budesonide-formoterol (SYMBICORT) 160-4.5 MCG/ACT inhaler Inhale 2 puffs into the lungs  2 (two) times daily.     02/25/2016 at Unknown time  . Cholecalciferol (VITAMIN D) 2000 units CAPS Take 2,000 Units by mouth daily.   Past Week at Unknown time  . clopidogrel (PLAVIX) 75 MG tablet Take 75 mg by mouth daily.   02/23/2016  . ferrous sulfate 325 (65 FE) MG EC tablet Take 325 mg by mouth every evening.   Past Week at Unknown time  . finasteride (PROSCAR) 5 MG tablet Take 5 mg by mouth every evening.    02/24/2016 at Unknown time  . loratadine (CLARITIN) 10 MG tablet Take 10 mg by mouth daily.   Past Week at Unknown time  . Memantine HCl-Donepezil HCl (NAMZARIC) 28-10 MG CP24 Take 1 tablet by mouth at bedtime.   02/24/2016 at Unknown time  . montelukast (SINGULAIR) 10 MG tablet Take 10 mg by mouth daily.    02/25/2016 at Unknown time  . Multiple Vitamins-Minerals (MULTIVITAMIN ADULT PO) Take 1 tablet by mouth daily.   Past Week at Unknown time  . pantoprazole (PROTONIX) 40 MG tablet Take 40 mg by mouth 2 (two) times daily.    02/25/2016 at Unknown time  .  predniSONE (DELTASONE) 10 MG tablet Take 10 mg by mouth daily with breakfast.   02/25/2016 at Unknown time  . roflumilast (DALIRESP) 500 MCG TABS tablet Take 250 mcg by mouth every evening.    02/24/2016 at Unknown time  . sitaGLIPtan-metformin (JANUMET) 50-500 MG per tablet Take 1 tablet by mouth 2 (two) times daily with a meal.     02/24/2016 at Unknown time  . solifenacin (VESICARE) 5 MG tablet Take 5 mg by mouth daily.    Past Week at Unknown time  . Tamsulosin HCl (FLOMAX) 0.4 MG CAPS Take 0.4 mg by mouth daily after supper.     Past Week at Unknown time  . telmisartan (MICARDIS) 80 MG tablet Take 80 mg by mouth daily.    02/24/2016 at Unknown time  . tiotropium (SPIRIVA) 18 MCG inhalation capsule Place 18 mcg into inhaler and inhale every evening.    02/24/2016 at Unknown time  . vitamin C (ASCORBIC ACID) 500 MG tablet Take 500 mg by mouth every evening.   Past Week at Unknown time  . nitroGLYCERIN (NITROSTAT) 0.4 MG SL tablet  Place 0.4 mg under the tongue every 5 (five) minutes as needed for chest pain.    Unknown at Unknown time   Allergies:  Allergies  Allergen Reactions  . Ciprofloxacin Hcl Other (See Comments)    Unknown   . Mirabegron Other (See Comments)    FATIGUE  . Penicillins     Has patient had a PCN reaction causing immediate rash, facial/tongue/throat swelling, SOB or lightheadedness with hypotension: Unknown Has patient had a PCN reaction causing severe rash involving mucus membranes or skin necrosis: Unknown Has patient had a PCN reaction that required hospitalization [place] Has patient had a PCN reaction occurring within the last 10 years: Unknown If all of the above answers are "NO", then may proceed with Cephalosporin use.   . Sulfa Antibiotics Hives and Rash  . Sulfonamide Derivatives Rash    Family History  Problem Relation Age of Onset  . Lung disease Father   . Asthma Father   . Sudden death Mother     unknown causes  . Lung disease Brother   . Asthma Brother   . Sudden death Brother 57    unknown causes  . Sudden death Sister     unknown causes  . Sudden death Sister     unknown causes   Social History:  reports that he quit smoking about 27 years ago. His smoking use included Cigarettes. He has a 43.00 pack-year smoking history. He has never used smokeless tobacco. He reports that he drinks alcohol. He reports that he does not use drugs.  Review of Systems  Genitourinary: Positive for frequency and urgency.  All other systems reviewed and are negative.   Physical Exam:  Vital signs in last 24 hours: Temp:  [98.5 F (36.9 C)] 98.5 F (36.9 C) (10/26 0721) Pulse Rate:  [96] 96 (10/26 0721) Resp:  [18] 18 (10/26 0721) BP: (137)/(58) 137/58 (10/26 0721) SpO2:  [99 %] 99 % (10/26 0721) Weight:  [74.4 kg (164 lb)] 74.4 kg (164 lb) (10/26 0734) Physical Exam  Constitutional: He is oriented to person, place, and time. He appears well-developed and well-nourished.   HENT:  Head: Normocephalic and atraumatic.  Eyes: EOM are normal. Pupils are equal, round, and reactive to light.  Neck: Normal range of motion. No thyromegaly present.  Cardiovascular: Normal rate and regular rhythm.   Respiratory: Effort normal. No respiratory distress.  GI:  Soft. He exhibits no distension.  Musculoskeletal: Normal range of motion. He exhibits no edema.  Neurological: He is alert and oriented to person, place, and time.  Skin: Skin is warm and dry.  Psychiatric: He has a normal mood and affect. His behavior is normal. Judgment and thought content normal.    Laboratory Data:  Results for orders placed or performed during the hospital encounter of 02/25/16 (from the past 24 hour(s))  Glucose, capillary     Status: Abnormal   Collection Time: 02/25/16  7:12 AM  Result Value Ref Range   Glucose-Capillary 137 (H) 65 - 99 mg/dL   No results found for this or any previous visit (from the past 240 hour(s)). Creatinine:  Recent Labs  02/23/16 1200  CREATININE 1.36*   Baseline Creatinine: 1.4  Impression/Assessment:  82yo with BPH with LUTS, urgency  Plan:  The risks/benefits/alternatives to Urolift was explained to the patient and he understands and wishes to proceed with surgery  Nicolette Bang 02/25/2016, 8:14 AM

## 2016-03-15 ENCOUNTER — Telehealth: Payer: Self-pay | Admitting: Pulmonary Disease

## 2016-03-15 DIAGNOSIS — G4733 Obstructive sleep apnea (adult) (pediatric): Secondary | ICD-10-CM

## 2016-03-15 DIAGNOSIS — J449 Chronic obstructive pulmonary disease, unspecified: Secondary | ICD-10-CM

## 2016-03-15 NOTE — Telephone Encounter (Signed)
Spoke with the pt's son, Dr Dagmar Hait  He states pt needing new CPAP supplies and new machine  They do not make parts for his current machine  He is asking to switch DME's from [facility] to [facility]  He states that this was all d/w Dr Elsworth Soho, and he okayed  Pt has not been seen in over 3 yrs, and so I made him a new appt with VS for Jan 2018  VS, please advise thanks

## 2016-03-16 NOTE — Telephone Encounter (Signed)
Okay to send order for new auto CPAP machine with range 5 to 15 cm H2O and heated humidity.  Please also send order for new mask and supplies.  Please explain to him that his insurance company might require repeat sleep study prior to approving new CPAP machine.  If this is required, then please check if he can have home sleep study.  Okay to change DME to [facility].

## 2016-03-16 NOTE — Telephone Encounter (Signed)
LMOMTCB x 1 

## 2016-03-18 NOTE — Telephone Encounter (Signed)
lmomtcb x 2  

## 2016-03-18 NOTE — Telephone Encounter (Signed)
Spoke with pt's son, [name]. He is aware that we will send this order to [facility] per his request. [name] would also like the pt to a nebulizer set up with [facility] as well. These orders have been placed. Nothing further was needed.

## 2016-04-20 ENCOUNTER — Telehealth: Payer: Self-pay | Admitting: Pulmonary Disease

## 2016-04-20 NOTE — Telephone Encounter (Signed)
Attempted to call Sam back but was on hold for over 5 mins. Will attempt to call back later today.

## 2016-04-21 NOTE — Telephone Encounter (Signed)
[name], spoke with Malachy Mood, states that she does not see anywhere in the chart where anything was needed on this patient. She can see the diagnosis code being used for the nebulizer order and can see the order was signed by Dr Halford Chessman. She is going to get with Sam and see what exactly was needed. Will await call back from [facility]

## 2016-05-09 ENCOUNTER — Encounter: Payer: Self-pay | Admitting: Neurology

## 2016-05-09 ENCOUNTER — Ambulatory Visit (INDEPENDENT_AMBULATORY_CARE_PROVIDER_SITE_OTHER): Payer: Medicare Other | Admitting: Neurology

## 2016-05-09 VITALS — BP 153/68 | HR 75 | Ht 66.0 in | Wt 170.0 lb

## 2016-05-09 DIAGNOSIS — F0390 Unspecified dementia without behavioral disturbance: Secondary | ICD-10-CM

## 2016-05-09 DIAGNOSIS — Z7409 Other reduced mobility: Secondary | ICD-10-CM | POA: Diagnosis not present

## 2016-05-09 DIAGNOSIS — R531 Weakness: Secondary | ICD-10-CM

## 2016-05-09 DIAGNOSIS — Z9181 History of falling: Secondary | ICD-10-CM | POA: Diagnosis not present

## 2016-05-09 NOTE — Patient Instructions (Signed)
Our phone number is [phone]. We also have an after hours call service for urgent matters and there is a physician on-call for urgent questions. For any emergencies you know to call 911 or go to the nearest emergency room
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
## 2016-05-11 ENCOUNTER — Telehealth: Payer: Self-pay | Admitting: Pulmonary Disease

## 2016-05-11 DIAGNOSIS — G4733 Obstructive sleep apnea (adult) (pediatric): Secondary | ICD-10-CM

## 2016-05-11 NOTE — Telephone Encounter (Signed)
Called and spoke to pt's son, [name]. [name] is requesting a new CPAP machine for pt, CPAP machine is > 81 years old. [name] states the machine is so old that the supplies are not able to fit correctly to his machine. Pt has not been seen since 2014 but has an upcoming appt with Dr. Halford Chessman on 05/25/16.   Dr. Halford Chessman please advise if ok to send in an order for a new CPAP machine prior to pt's appt. Thanks.

## 2016-05-11 NOTE — Telephone Encounter (Signed)
Spoke with Dr Dagmar Hait, pt son, aware that order is being place for CPAP machine.  Aware also that if any issues arise with insurance that we may have to repeat the sleep study - Dr Dagmar Hait expressed understanding and will let us know if they hear anything before we contact them.  Order placed for CPAP machine. Nothing further needed.

## 2016-05-11 NOTE — Telephone Encounter (Signed)
Okay to send order for new auto CPAP range 5 to 15 cm H2O with heated humidity.  Please inform him that his insurance company might require him to have repeat sleep study prior to covering new CPAP machine.

## 2016-05-25 ENCOUNTER — Encounter: Payer: Self-pay | Admitting: Pulmonary Disease

## 2016-05-25 ENCOUNTER — Ambulatory Visit (INDEPENDENT_AMBULATORY_CARE_PROVIDER_SITE_OTHER): Payer: Medicare Other | Admitting: Pulmonary Disease

## 2016-05-25 VITALS — BP 114/70 | HR 75 | Ht 66.0 in | Wt 170.0 lb

## 2016-05-25 DIAGNOSIS — G4733 Obstructive sleep apnea (adult) (pediatric): Secondary | ICD-10-CM | POA: Diagnosis not present

## 2016-05-25 DIAGNOSIS — J449 Chronic obstructive pulmonary disease, unspecified: Secondary | ICD-10-CM | POA: Diagnosis not present

## 2016-05-25 NOTE — Patient Instructions (Signed)
Change prednisone to 10 mg on one day, and 5 mg on the next day.  Do this for 2 weeks and if breathing symptoms are stable, then change prednisone to 5 mg daily  Will ensure that you get new CPAP machine  Follow up in 3 months

## 2016-05-25 NOTE — Progress Notes (Signed)
   Subjective:    Patient ID: [name], male    DOB: 1928/12/13, 83 y.o.   MRN: [id number]  HPI    Review of Systems  Constitutional: Negative for fever and unexpected weight change.  HENT: Negative for congestion, dental problem, ear pain, nosebleeds, postnasal drip, rhinorrhea, sinus pressure, sneezing, sore throat and trouble swallowing.   Eyes: Negative for redness and itching.  Respiratory: Negative for cough, chest tightness, shortness of breath and wheezing.   Cardiovascular: Negative for palpitations and leg swelling.  Gastrointestinal: Negative for nausea and vomiting.  Genitourinary: Negative for dysuria.  Musculoskeletal: Negative for joint swelling.  Skin: Negative for rash.  Neurological: Negative for headaches.  Hematological: Does not bruise/bleed easily.  Psychiatric/Behavioral: Negative for dysphoric mood. The patient is not nervous/anxious.        Objective:   Physical Exam        Assessment & Plan:

## 2016-05-25 NOTE — Progress Notes (Signed)
Current Outpatient Prescriptions on File Prior to Visit  Medication Sig  . albuterol (PROVENTIL) (2.5 MG/3ML) 0.083% nebulizer solution Take 0.83 mg by nebulization 3 (three) times daily.   Marland Kitchen amLODipine (NORVASC) 5 MG tablet Take 5 mg by mouth every morning.   Marland Kitchen aspirin 81 MG tablet Take 81 mg by mouth daily.   Marland Kitchen atorvastatin (LIPITOR) 20 MG tablet Take 20 mg by mouth every morning.   . budesonide-formoterol (SYMBICORT) 160-4.5 MCG/ACT inhaler Inhale 1 Dose into the lungs 2 (two) times daily.  Marland Kitchen BYSTOLIC 5 MG tablet Take 5 mg by mouth every morning.   . Cholecalciferol (VITAMIN D) 2000 units CAPS Take 1,000 Units by mouth 2 (two) times daily.   . clopidogrel (PLAVIX) 75 MG tablet Take 1 tablet (75 mg total) by mouth daily. (Patient taking differently: Take 75 mg by mouth every morning. )  . ferrous sulfate 325 (65 FE) MG EC tablet Take 325 mg by mouth daily.   . finasteride (PROSCAR) 5 MG tablet Take 5 mg by mouth daily.   Marland Kitchen glimepiride (AMARYL) 2 MG tablet Take 2 mg by mouth daily with breakfast.  . loratadine (CLARITIN) 10 MG tablet Take 10 mg by mouth every morning.   . Memantine HCl-Donepezil HCl (NAMZARIC) 28-10 MG CP24 Take 1 tablet by mouth daily.   . montelukast (SINGULAIR) 10 MG tablet Take 10 mg by mouth every morning.   . Multiple Vitamins-Minerals (MULTIVITAMIN ADULT PO) Take 1 tablet by mouth every morning.   . nitroGLYCERIN (NITROSTAT) 0.4 MG SL tablet Place 0.4 mg under the tongue every 5 (five) minutes as needed for chest pain.   . pantoprazole (PROTONIX) 40 MG tablet Take 40 mg by mouth 2 (two) times daily.   . predniSONE (DELTASONE) 10 MG tablet Take 10 mg by mouth daily with breakfast.  . roflumilast (DALIRESP) 500 MCG TABS tablet Take 250 mcg by mouth daily.   . sitaGLIPtan-metformin (JANUMET) 50-500 MG per tablet Take 1 tablet by mouth 2 (two) times daily with a meal.    . solifenacin (VESICARE) 5 MG tablet Take 5 mg by mouth every morning.   . Tamsulosin HCl (FLOMAX) 0.4  MG CAPS Take 0.4 mg by mouth daily.   Marland Kitchen telmisartan (MICARDIS) 80 MG tablet Take 80 mg by mouth every morning.   . tiotropium (SPIRIVA) 18 MCG inhalation capsule Place 18 mcg into inhaler and inhale daily.   . vitamin C (ASCORBIC ACID) 500 MG tablet Take 500 mg by mouth every evening.   No current facility-administered medications on file prior to visit.     Chief Complaint  Patient presents with  . Pulm Consult    re-establish for sleep apnea. Has a history of COPD and wheezing.     Pulmonary tests PFT 07/31/05 >> FEV1 1.30 (54%), FEV1% 45, TLC 4.89 (91%), DLCO 55%  Sleep tests PSG 07/2006 >> AHI 23  Cardiac tests Echo 03/01/13 >> EF 65 to 35%, grade 1 diastolic dysfx  Vital signs BP 114/70 (BP Location: Left Arm, Patient Position: Sitting, Cuff Size: Normal)   Pulse 75   Ht 5\' 6"  (1.676 m)   Wt 170 lb (77.1 kg)   SpO2 98%   BMI 27.44 kg/m   History of Present Illness: [name] is a 83 y.o. male with severe COPD and moderate OSA.  I last saw him in 2014.    He needed a new CPAP machine.  This has been ordered.  He uses CPAP nightly.  His family is  concerned about his activity level.  He is no longer exercising and is not very active.  He gets winded more easily.  He is not having cough, wheeze, chest pain, or sputum.  He has mild ankle swelling.  He remains on prednisone 10 mg daily.  He gets vaccinations with his PCP.   Physical Exam:  General - pleasant HEENT - poor dentition, no oral exudate, no sinus tenderness Cardiac - regular, no murmur Chest - prolonged exhalation, no wheeze/rales Abdomen - soft, non tender Extremities - ankle edema Neurologic - normal strength Psychiatric - normal mood  Assessment/Plan:  Obstructive sleep apnea. - ordered has been placed for new auto CPAP  - advised he might need repeat sleep study for insurance coverage of new machine >> can do home sleep study  COPD with chronic bronchitis. - will have him try to taper his  prednisone to 5 mg daily as tolerated - continue symbicort, spiriva, daliresp, singulair, prn albuterol   Patient Instructions  Change prednisone to 10 mg on one day, and 5 mg on the next day.  Do this for 2 weeks and if breathing symptoms are stable, then change prednisone to 5 mg daily  Will ensure that you get new CPAP machine  Follow up in 3 months   [name], MD [place] 05/25/2016, 2:45 PM Pager:  [phone] After 3pm call: [phone]

## 2016-05-31 ENCOUNTER — Encounter: Payer: Self-pay | Admitting: *Deleted

## 2016-05-31 NOTE — Progress Notes (Signed)
Faxed signed orders back to [facility] home health re: PT evaluation performed, no additional visits required and Home health certification/POC dated 05/12/16-07/10/16.  Fax: [phone]. Received confirmation.

## 2016-08-23 ENCOUNTER — Ambulatory Visit: Payer: Medicare Other | Admitting: Pulmonary Disease

## 2016-09-09 ENCOUNTER — Ambulatory Visit (INDEPENDENT_AMBULATORY_CARE_PROVIDER_SITE_OTHER): Payer: Medicare Other | Admitting: Pulmonary Disease

## 2016-09-09 ENCOUNTER — Encounter: Payer: Self-pay | Admitting: Pulmonary Disease

## 2016-09-09 VITALS — BP 118/70 | HR 76 | Resp 14 | Ht 66.0 in | Wt 165.6 lb

## 2016-09-09 DIAGNOSIS — J449 Chronic obstructive pulmonary disease, unspecified: Secondary | ICD-10-CM | POA: Diagnosis not present

## 2016-09-09 DIAGNOSIS — J4489 Other specified chronic obstructive pulmonary disease: Secondary | ICD-10-CM

## 2016-09-09 DIAGNOSIS — G4733 Obstructive sleep apnea (adult) (pediatric): Secondary | ICD-10-CM | POA: Diagnosis not present

## 2016-09-09 NOTE — Patient Instructions (Signed)
Follow up in 1 year.

## 2016-09-09 NOTE — Progress Notes (Signed)
Current Outpatient Prescriptions on File Prior to Visit  Medication Sig  . albuterol (PROVENTIL) (2.5 MG/3ML) 0.083% nebulizer solution Take 0.83 mg by nebulization 3 (three) times daily.   Marland Kitchen amLODipine (NORVASC) 5 MG tablet Take 5 mg by mouth every morning.   Marland Kitchen aspirin 81 MG tablet Take 81 mg by mouth daily.   Marland Kitchen atorvastatin (LIPITOR) 20 MG tablet Take 20 mg by mouth every morning.   . budesonide-formoterol (SYMBICORT) 160-4.5 MCG/ACT inhaler Inhale 1 Dose into the lungs 2 (two) times daily.  Marland Kitchen BYSTOLIC 5 MG tablet Take 5 mg by mouth every morning.   . Cholecalciferol (VITAMIN D) 2000 units CAPS Take 1,000 Units by mouth 2 (two) times daily.   . clopidogrel (PLAVIX) 75 MG tablet Take 1 tablet (75 mg total) by mouth daily. (Patient taking differently: Take 75 mg by mouth every morning. )  . ferrous sulfate 325 (65 FE) MG EC tablet Take 325 mg by mouth daily.   . finasteride (PROSCAR) 5 MG tablet Take 5 mg by mouth daily.   Marland Kitchen glimepiride (AMARYL) 2 MG tablet Take 2 mg by mouth daily with breakfast.  . loratadine (CLARITIN) 10 MG tablet Take 10 mg by mouth every morning.   . Memantine HCl-Donepezil HCl (NAMZARIC) 28-10 MG CP24 Take 1 tablet by mouth daily.   . montelukast (SINGULAIR) 10 MG tablet Take 10 mg by mouth every morning.   . Multiple Vitamins-Minerals (MULTIVITAMIN ADULT PO) Take 1 tablet by mouth every morning.   . nitroGLYCERIN (NITROSTAT) 0.4 MG SL tablet Place 0.4 mg under the tongue every 5 (five) minutes as needed for chest pain.   . pantoprazole (PROTONIX) 40 MG tablet Take 40 mg by mouth 2 (two) times daily.   . predniSONE (DELTASONE) 10 MG tablet Take 10 mg by mouth daily with breakfast.  . roflumilast (DALIRESP) 500 MCG TABS tablet Take 250 mcg by mouth daily.   . sitaGLIPtan-metformin (JANUMET) 50-500 MG per tablet Take 1 tablet by mouth 2 (two) times daily with a meal.    . solifenacin (VESICARE) 5 MG tablet Take 5 mg by mouth every morning.   . Tamsulosin HCl (FLOMAX) 0.4  MG CAPS Take 0.4 mg by mouth daily.   Marland Kitchen telmisartan (MICARDIS) 80 MG tablet Take 80 mg by mouth every morning.   . tiotropium (SPIRIVA) 18 MCG inhalation capsule Place 18 mcg into inhaler and inhale daily.   . vitamin C (ASCORBIC ACID) 500 MG tablet Take 500 mg by mouth every evening.   No current facility-administered medications on file prior to visit.     Chief Complaint  Patient presents with  . Sleep Apnea    DME is [employer] patient's wifi part needs to be replaced. patient states that he sleeps very well with the cpap.     Pulmonary tests PFT 07/31/05 >> FEV1 1.30 (54%), FEV1% 45, TLC 4.89 (91%), DLCO 55%  Sleep tests PSG 07/2006 >> AHI 23 Auto CPAP 08/09/16 to 09/07/16 >> used on 28 of 30 nights with average 5 hrs.  Average AHI 3.1 with median CPAP 8 and 95 th percentile CPAP 10 cm H2O  Cardiac tests Echo 03/01/13 >> EF 65 to 63%, grade 1 diastolic dysfx  Vital signs BP 118/70 (BP Location: Right Arm, Cuff Size: Normal)   Pulse 76   Resp 14   Ht 5\' 6"  (1.676 m)   Wt 165 lb 9.6 oz (75.1 kg)   SpO2 95%   BMI 26.73 kg/m   History of Present Illness:  [name] is a 83 y.o. male with severe COPD and moderate OSA.  He is here with his wife.  He has been doing well with CPAP.  No issues with mask fit.  Sleeping better, and more rested.  He has noticed more cough and chest congestion with allergy season.  No wheeze, fever, chest pain, or hemoptysis.  Physical Exam:  General - pleasant Eyes - pupils reactive ENT - no sinus tenderness, no oral exudate, no LAN, poor dentition Cardiac - regular, no murmur Chest - no wheeze, rales Abd - soft, non tender Ext - no edema Skin - no rashes Neuro - normal strength Psych - normal mood   Assessment/Plan:  Obstructive sleep apnea. - he is compliant with CPAP and reports benefit - continue auto CPAP.  COPD with chronic bronchitis. - mild progression of symptoms likely related to allergies - can use albuterol more  frequently for now, and try mucinex prn - continue spiriva, daliresp, singulair, symbicort - continue 10 mg prednisone daily >> he hasn't been able to tolerate attempts to wean this off   Patient Instructions  Follow up in 1 year   [name], MD [place] 09/09/2016, 11:18 AM Pager:  [phone]

## 2016-12-28 ENCOUNTER — Ambulatory Visit: Payer: Medicare Other | Admitting: Neurology

## 2017-02-02 ENCOUNTER — Encounter: Payer: Self-pay | Admitting: Neurology

## 2017-02-14 ENCOUNTER — Ambulatory Visit: Payer: Medicare Other | Admitting: Neurology

## 2017-05-07 ENCOUNTER — Emergency Department (HOSPITAL_COMMUNITY): Payer: Medicare Other

## 2017-05-07 ENCOUNTER — Observation Stay (HOSPITAL_COMMUNITY): Payer: Medicare Other

## 2017-05-07 ENCOUNTER — Inpatient Hospital Stay (HOSPITAL_COMMUNITY)
Admission: EM | Admit: 2017-05-07 | Discharge: 2017-05-10 | DRG: 312 | Disposition: A | Discharge status: Home Health | Payer: Medicare Other | Attending: Internal Medicine | Admitting: Internal Medicine

## 2017-05-07 ENCOUNTER — Encounter (HOSPITAL_COMMUNITY): Payer: Self-pay | Admitting: Pharmacy Technician

## 2017-05-07 DIAGNOSIS — Z974 Presence of external hearing-aid: Secondary | ICD-10-CM

## 2017-05-07 DIAGNOSIS — N390 Urinary tract infection, site not specified: Secondary | ICD-10-CM | POA: Diagnosis present

## 2017-05-07 DIAGNOSIS — S0181XA Laceration without foreign body of other part of head, initial encounter: Secondary | ICD-10-CM

## 2017-05-07 DIAGNOSIS — Z7951 Long term (current) use of inhaled steroids: Secondary | ICD-10-CM

## 2017-05-07 DIAGNOSIS — F0390 Unspecified dementia without behavioral disturbance: Secondary | ICD-10-CM | POA: Diagnosis present

## 2017-05-07 DIAGNOSIS — R062 Wheezing: Secondary | ICD-10-CM

## 2017-05-07 DIAGNOSIS — W182XXA Fall in (into) shower or empty bathtub, initial encounter: Secondary | ICD-10-CM | POA: Diagnosis present

## 2017-05-07 DIAGNOSIS — E785 Hyperlipidemia, unspecified: Secondary | ICD-10-CM | POA: Diagnosis present

## 2017-05-07 DIAGNOSIS — N3281 Overactive bladder: Secondary | ICD-10-CM | POA: Diagnosis present

## 2017-05-07 DIAGNOSIS — Z87891 Personal history of nicotine dependence: Secondary | ICD-10-CM

## 2017-05-07 DIAGNOSIS — Z882 Allergy status to sulfonamides status: Secondary | ICD-10-CM

## 2017-05-07 DIAGNOSIS — K219 Gastro-esophageal reflux disease without esophagitis: Secondary | ICD-10-CM | POA: Diagnosis present

## 2017-05-07 DIAGNOSIS — I493 Ventricular premature depolarization: Secondary | ICD-10-CM | POA: Diagnosis present

## 2017-05-07 DIAGNOSIS — E118 Type 2 diabetes mellitus with unspecified complications: Secondary | ICD-10-CM

## 2017-05-07 DIAGNOSIS — W19XXXA Unspecified fall, initial encounter: Secondary | ICD-10-CM | POA: Diagnosis not present

## 2017-05-07 DIAGNOSIS — Z955 Presence of coronary angioplasty implant and graft: Secondary | ICD-10-CM

## 2017-05-07 DIAGNOSIS — Z79899 Other long term (current) drug therapy: Secondary | ICD-10-CM

## 2017-05-07 DIAGNOSIS — K449 Diaphragmatic hernia without obstruction or gangrene: Secondary | ICD-10-CM | POA: Diagnosis present

## 2017-05-07 DIAGNOSIS — S0121XA Laceration without foreign body of nose, initial encounter: Secondary | ICD-10-CM | POA: Diagnosis present

## 2017-05-07 DIAGNOSIS — R55 Syncope and collapse: Secondary | ICD-10-CM | POA: Diagnosis not present

## 2017-05-07 DIAGNOSIS — Z7952 Long term (current) use of systemic steroids: Secondary | ICD-10-CM

## 2017-05-07 DIAGNOSIS — G309 Alzheimer's disease, unspecified: Secondary | ICD-10-CM | POA: Diagnosis not present

## 2017-05-07 DIAGNOSIS — N400 Enlarged prostate without lower urinary tract symptoms: Secondary | ICD-10-CM | POA: Diagnosis present

## 2017-05-07 DIAGNOSIS — Z888 Allergy status to other drugs, medicaments and biological substances status: Secondary | ICD-10-CM

## 2017-05-07 DIAGNOSIS — S0993XA Unspecified injury of face, initial encounter: Secondary | ICD-10-CM | POA: Diagnosis not present

## 2017-05-07 DIAGNOSIS — E273 Drug-induced adrenocortical insufficiency: Secondary | ICD-10-CM | POA: Diagnosis present

## 2017-05-07 DIAGNOSIS — G4733 Obstructive sleep apnea (adult) (pediatric): Secondary | ICD-10-CM | POA: Diagnosis present

## 2017-05-07 DIAGNOSIS — Z7902 Long term (current) use of antithrombotics/antiplatelets: Secondary | ICD-10-CM

## 2017-05-07 DIAGNOSIS — I2510 Atherosclerotic heart disease of native coronary artery without angina pectoris: Secondary | ICD-10-CM | POA: Diagnosis not present

## 2017-05-07 DIAGNOSIS — R296 Repeated falls: Secondary | ICD-10-CM

## 2017-05-07 DIAGNOSIS — I129 Hypertensive chronic kidney disease with stage 1 through stage 4 chronic kidney disease, or unspecified chronic kidney disease: Secondary | ICD-10-CM | POA: Diagnosis present

## 2017-05-07 DIAGNOSIS — E1122 Type 2 diabetes mellitus with diabetic chronic kidney disease: Secondary | ICD-10-CM | POA: Diagnosis present

## 2017-05-07 DIAGNOSIS — H903 Sensorineural hearing loss, bilateral: Secondary | ICD-10-CM | POA: Diagnosis present

## 2017-05-07 DIAGNOSIS — J302 Other seasonal allergic rhinitis: Secondary | ICD-10-CM | POA: Diagnosis present

## 2017-05-07 DIAGNOSIS — Z794 Long term (current) use of insulin: Secondary | ICD-10-CM

## 2017-05-07 DIAGNOSIS — Z825 Family history of asthma and other chronic lower respiratory diseases: Secondary | ICD-10-CM

## 2017-05-07 DIAGNOSIS — N183 Chronic kidney disease, stage 3 (moderate): Secondary | ICD-10-CM | POA: Diagnosis present

## 2017-05-07 DIAGNOSIS — R008 Other abnormalities of heart beat: Secondary | ICD-10-CM | POA: Diagnosis present

## 2017-05-07 DIAGNOSIS — Z880 Allergy status to penicillin: Secondary | ICD-10-CM

## 2017-05-07 DIAGNOSIS — F0280 Dementia in other diseases classified elsewhere without behavioral disturbance: Secondary | ICD-10-CM | POA: Diagnosis present

## 2017-05-07 DIAGNOSIS — Z7982 Long term (current) use of aspirin: Secondary | ICD-10-CM

## 2017-05-07 DIAGNOSIS — Z881 Allergy status to other antibiotic agents status: Secondary | ICD-10-CM

## 2017-05-07 DIAGNOSIS — J449 Chronic obstructive pulmonary disease, unspecified: Secondary | ICD-10-CM | POA: Diagnosis not present

## 2017-05-07 DIAGNOSIS — Z96651 Presence of right artificial knee joint: Secondary | ICD-10-CM | POA: Diagnosis present

## 2017-05-07 DIAGNOSIS — Z8673 Personal history of transient ischemic attack (TIA), and cerebral infarction without residual deficits: Secondary | ICD-10-CM

## 2017-05-07 LAB — BASIC METABOLIC PANEL
Anion gap: 10 (ref 5–15)
BUN: 26 mg/dL — AB (ref 6–20)
CO2: 25 mmol/L (ref 22–32)
CREATININE: 1.39 mg/dL — AB (ref 0.61–1.24)
Calcium: 9.1 mg/dL (ref 8.9–10.3)
Chloride: 104 mmol/L (ref 101–111)
GFR, EST AFRICAN AMERICAN: 51 mL/min — AB (ref >60)
GFR, EST NON AFRICAN AMERICAN: 44 mL/min — AB (ref >60)
Glucose, Bld: 152 mg/dL — ABNORMAL HIGH (ref 65–99)
Potassium: 4.6 mmol/L (ref 3.5–5.1)
SODIUM: 139 mmol/L (ref 135–145)

## 2017-05-07 LAB — MAGNESIUM: Magnesium: 1.8 mg/dL (ref 1.7–2.4)

## 2017-05-07 LAB — CBC WITH DIFFERENTIAL/PLATELET
BASOS ABS: 0 10*3/uL (ref 0.0–0.1)
Basophils Relative: 0 %
EOS ABS: 1 10*3/uL — AB (ref 0.0–0.7)
EOS PCT: 7 %
HCT: 36.5 % — ABNORMAL LOW (ref 39.0–52.0)
HEMOGLOBIN: 11.5 g/dL — AB (ref 13.0–17.0)
LYMPHS ABS: 1.2 10*3/uL (ref 0.7–4.0)
LYMPHS PCT: 8 %
MCH: 26.6 pg (ref 26.0–34.0)
MCHC: 31.5 g/dL (ref 30.0–36.0)
MCV: 84.5 fL (ref 78.0–100.0)
Monocytes Absolute: 0.8 10*3/uL (ref 0.1–1.0)
Monocytes Relative: 6 %
NEUTROS PCT: 79 %
Neutro Abs: 11.7 10*3/uL — ABNORMAL HIGH (ref 1.7–7.7)
PLATELETS: 170 10*3/uL (ref 150–400)
RBC: 4.32 MIL/uL (ref 4.22–5.81)
RDW: 14.2 % (ref 11.5–15.5)
WBC: 14.7 10*3/uL — ABNORMAL HIGH (ref 4.0–10.5)

## 2017-05-07 LAB — CBG MONITORING, ED
GLUCOSE-CAPILLARY: 146 mg/dL — AB (ref 65–99)
Glucose-Capillary: 200 mg/dL — ABNORMAL HIGH (ref 65–99)

## 2017-05-07 LAB — I-STAT TROPONIN, ED: Troponin i, poc: 0 ng/mL (ref 0.00–0.08)

## 2017-05-07 LAB — TROPONIN I

## 2017-05-07 MED ORDER — MIRABEGRON ER 50 MG PO TB24
50.0000 mg | ORAL_TABLET | Freq: Every day | ORAL | Status: DC
  Administered 2017-05-07 – 2017-05-09 (×3): 50 mg via ORAL
  Filled 2017-05-07 (×4): qty 1

## 2017-05-07 MED ORDER — ONDANSETRON HCL 4 MG/2ML IJ SOLN: 4.0000 mg | Freq: Four times a day (QID) | INTRAMUSCULAR | Status: DC | PRN

## 2017-05-07 MED ORDER — CLOPIDOGREL BISULFATE 75 MG PO TABS
75.0000 mg | ORAL_TABLET | Freq: Every day | ORAL | Status: DC
  Administered 2017-05-08 – 2017-05-10 (×3): 75 mg via ORAL
  Filled 2017-05-07 (×3): qty 1

## 2017-05-07 MED ORDER — IRBESARTAN 300 MG PO TABS
300.0000 mg | ORAL_TABLET | Freq: Every day | ORAL | Status: DC
  Administered 2017-05-07 – 2017-05-10 (×4): 300 mg via ORAL
  Filled 2017-05-07 (×5): qty 1

## 2017-05-07 MED ORDER — ONDANSETRON HCL 4 MG PO TABS: 4.0000 mg | ORAL_TABLET | Freq: Four times a day (QID) | ORAL | Status: DC | PRN

## 2017-05-07 MED ORDER — ACETAMINOPHEN 650 MG RE SUPP: 650.0000 mg | Freq: Four times a day (QID) | RECTAL | Status: DC | PRN

## 2017-05-07 MED ORDER — FINASTERIDE 5 MG PO TABS
5.0000 mg | ORAL_TABLET | Freq: Every day | ORAL | Status: DC
  Administered 2017-05-08 – 2017-05-09 (×2): 5 mg via ORAL
  Filled 2017-05-07 (×3): qty 1

## 2017-05-07 MED ORDER — DONEPEZIL HCL 10 MG PO TABS
10.0000 mg | ORAL_TABLET | Freq: Every day | ORAL | Status: DC
  Administered 2017-05-08 – 2017-05-09 (×2): 10 mg via ORAL
  Filled 2017-05-07 (×2): qty 1

## 2017-05-07 MED ORDER — ALBUTEROL SULFATE (2.5 MG/3ML) 0.083% IN NEBU
2.5000 mg | INHALATION_SOLUTION | Freq: Four times a day (QID) | RESPIRATORY_TRACT | Status: DC | PRN
  Administered 2017-05-08: 2.5 mg via RESPIRATORY_TRACT
  Filled 2017-05-07: qty 3

## 2017-05-07 MED ORDER — ATORVASTATIN CALCIUM 20 MG PO TABS
20.0000 mg | ORAL_TABLET | Freq: Every day | ORAL | Status: DC
  Administered 2017-05-08 – 2017-05-10 (×3): 20 mg via ORAL
  Filled 2017-05-07 (×4): qty 1

## 2017-05-07 MED ORDER — PANTOPRAZOLE SODIUM 40 MG PO TBEC
40.0000 mg | DELAYED_RELEASE_TABLET | Freq: Two times a day (BID) | ORAL | Status: DC
  Administered 2017-05-07 – 2017-05-10 (×6): 40 mg via ORAL
  Filled 2017-05-07 (×6): qty 1

## 2017-05-07 MED ORDER — AMLODIPINE BESYLATE 5 MG PO TABS
5.0000 mg | ORAL_TABLET | Freq: Every day | ORAL | Status: DC
  Administered 2017-05-08 – 2017-05-10 (×3): 5 mg via ORAL
  Filled 2017-05-07 (×3): qty 1

## 2017-05-07 MED ORDER — ALBUTEROL SULFATE (2.5 MG/3ML) 0.083% IN NEBU
2.5000 mg | INHALATION_SOLUTION | RESPIRATORY_TRACT | Status: AC
  Administered 2017-05-07: 2.5 mg via RESPIRATORY_TRACT
  Filled 2017-05-07: qty 3

## 2017-05-07 MED ORDER — LIDOCAINE-EPINEPHRINE (PF) 2 %-1:200000 IJ SOLN
10.0000 mL | Freq: Once | INTRAMUSCULAR | Status: AC
  Administered 2017-05-07: 10 mL via INTRADERMAL
  Filled 2017-05-07: qty 20

## 2017-05-07 MED ORDER — MEMANTINE HCL 5 MG PO TABS
10.0000 mg | ORAL_TABLET | Freq: Two times a day (BID) | ORAL | Status: DC
  Administered 2017-05-07 – 2017-05-10 (×6): 10 mg via ORAL
  Filled 2017-05-07 (×2): qty 2
  Filled 2017-05-07 (×2): qty 1
  Filled 2017-05-07 (×4): qty 2

## 2017-05-07 MED ORDER — FLUTICASONE-UMECLIDIN-VILANT 100-62.5-25 MCG/INH IN AEPB: 1.0000 | INHALATION_SPRAY | Freq: Every day | RESPIRATORY_TRACT | Status: DC

## 2017-05-07 MED ORDER — INSULIN ASPART 100 UNIT/ML ~~LOC~~ SOLN
4.0000 [IU] | Freq: Two times a day (BID) | SUBCUTANEOUS | Status: DC
  Administered 2017-05-07 – 2017-05-10 (×6): 4 [IU] via SUBCUTANEOUS
  Filled 2017-05-07: qty 1

## 2017-05-07 MED ORDER — NEBIVOLOL HCL 5 MG PO TABS
5.0000 mg | ORAL_TABLET | Freq: Every day | ORAL | Status: DC
  Administered 2017-05-08 – 2017-05-10 (×3): 5 mg via ORAL
  Filled 2017-05-07 (×4): qty 1

## 2017-05-07 MED ORDER — ALBUTEROL SULFATE (2.5 MG/3ML) 0.083% IN NEBU
0.8300 mg | INHALATION_SOLUTION | Freq: Two times a day (BID) | RESPIRATORY_TRACT | Status: DC
Start: 2017-05-08 — End: 2017-05-08

## 2017-05-07 MED ORDER — ASPIRIN EC 81 MG PO TBEC
81.0000 mg | DELAYED_RELEASE_TABLET | Freq: Every day | ORAL | Status: DC
  Administered 2017-05-07 – 2017-05-09 (×3): 81 mg via ORAL
  Filled 2017-05-07 (×3): qty 1

## 2017-05-07 MED ORDER — ENOXAPARIN SODIUM 40 MG/0.4ML ~~LOC~~ SOLN
40.0000 mg | SUBCUTANEOUS | Status: DC
  Administered 2017-05-07 – 2017-05-09 (×3): 40 mg via SUBCUTANEOUS
  Filled 2017-05-07 (×3): qty 0.4

## 2017-05-07 MED ORDER — PREDNISONE 5 MG PO TABS
5.0000 mg | ORAL_TABLET | Freq: Every day | ORAL | Status: DC
  Administered 2017-05-08 – 2017-05-10 (×3): 5 mg via ORAL
  Filled 2017-05-07 (×4): qty 1

## 2017-05-07 MED ORDER — ESCITALOPRAM OXALATE 10 MG PO TABS
5.0000 mg | ORAL_TABLET | Freq: Every day | ORAL | Status: DC
  Administered 2017-05-07 – 2017-05-09 (×3): 5 mg via ORAL
  Filled 2017-05-07 (×3): qty 1

## 2017-05-07 MED ORDER — ACETAMINOPHEN 325 MG PO TABS
650.0000 mg | ORAL_TABLET | Freq: Four times a day (QID) | ORAL | Status: DC | PRN
  Administered 2017-05-09: 650 mg via ORAL
  Filled 2017-05-07 (×2): qty 2

## 2017-05-07 MED ORDER — MONTELUKAST SODIUM 10 MG PO TABS
10.0000 mg | ORAL_TABLET | Freq: Every day | ORAL | Status: DC
  Administered 2017-05-08 – 2017-05-10 (×3): 10 mg via ORAL
  Filled 2017-05-07 (×4): qty 1

## 2017-05-07 NOTE — ED Triage Notes (Signed)
Pt arrives via ems from home. Pt was in the shower, possible syncopal event after the fall. Lac above L eye and bridge of nose. Bleeding controlled. On Plavix and asa. Frequent PVC's, placed on 4L [place]. CBG 156, BP 116/55, 64HR irregular, GCS 15. Pt denies pain. Hx of dementia. 18G LAC.

## 2017-05-07 NOTE — ED Notes (Signed)
Son, Dr Dagmar Hait, [phone].

## 2017-05-07 NOTE — ED Provider Notes (Signed)
[facility]PART[facility] Provider Note  CSN: [id number] Arrival date & time: 05/07/17 1552  Chief Complaint(s) Loss of Consciousness and Fall  HPI [name] is a 84 y.o. male with a history of hypertension, hyperlipidemia, diabetes, CAD status post stenting 2005, asthma, COPD, mild to moderate dementia who presents to the emergency department after an unwitnessed fall resulting in head trauma.  Family reports that the patient was taken a shower when the wife heard a loud noise in the bathroom.  She went upstairs and found the patient between the shower in the toilet face down.  The patient was unresponsive for about 3-4 minutes and slowly came to.  Within several minutes the patient lost consciousness again for another several minutes and then slowly regained consciousness.  EMS was called and noted that the patient was hemodynamically stable with a normal CBG.  Prior to leaving the home patient was noted to be at his baseline mental status.  Patient denies any associated symptoms prior to the episode.  He currently denies any physical complaints, other than the sustained lacerations of the face.  Tetanus status is unknown however, the son who is a physician will follow up on this and provide it if needed.  HPI  Past Medical History Past Medical History:  Diagnosis Date  . Anemia   . Asthma   . Benign prostatic hypertrophy   . Bilateral sensorineural hearing loss   . CAD (coronary artery disease) 07/2003   a.  3.0 x 28 mm Taxus DES in the mid RCA in 2005  . COPD (chronic obstructive pulmonary disease) ([place])   . Diverticulosis   . DJD (degenerative joint disease)   . DM (diabetes mellitus) ([place])   . Dyslipidemia   . GERD (gastroesophageal reflux disease)   . Hemorrhoids   . Hiatal hernia   . Hypertension   . OSA (obstructive sleep apnea)    cpap use  . Osteoarthritis   . Overactive bladder   . Rhinitis   . Seasonal allergies   . Shingles   .  Stroke ([place])   . TIA (transient ischemic attack) 12/2006   Dr. Leonie Man   Patient Active Problem List   Diagnosis Date Noted  . Fall 05/07/2017  . Alzheimer's dementia without behavioral disturbance 10/10/2015  . Dementia 04/22/2015  . Alzheimer's disease 04/22/2015  . Community acquired pneumonia 02/24/2013    Class: Acute  . Iatrogenic adrenal insufficiency (HCC) 02/24/2013    Class: Chronic  . Type II or unspecified type diabetes mellitus with neurological manifestations, not stated as uncontrolled(250.60) 01/04/2013    Class: Chronic  . Coronary artery disease 01/03/2012  . OSA (obstructive sleep apnea) 03/13/2007  . COPD (chronic obstructive pulmonary disease) ([place]) 03/13/2007   Home Medication(s) Prior to Admission medications   Medication Sig Start Date End Date Taking? Authorizing Provider  albuterol (PROVENTIL) (2.5 MG/3ML) 0.083% nebulizer solution Take 0.83 mg by nebulization 3 (three) times daily.     [provider]  amLODipine (NORVASC) 5 MG tablet Take 5 mg by mouth every morning.     [provider]  aspirin 81 MG tablet Take 81 mg by mouth daily.     [provider]  atorvastatin (LIPITOR) 20 MG tablet Take 20 mg by mouth every morning.     [provider]  budesonide-formoterol (SYMBICORT) 160-4.5 MCG/ACT inhaler Inhale 1 Dose into the lungs 2 (two) times daily.    [provider]  BYSTOLIC 5 MG tablet Take 5 mg  by mouth every morning.  05/03/16   [provider]  Cholecalciferol (VITAMIN D) 2000 units CAPS Take 1,000 Units by mouth 2 (two) times daily.     [provider]  clopidogrel (PLAVIX) 75 MG tablet Take 1 tablet (75 mg total) by mouth daily. Patient taking differently: Take 75 mg by mouth every morning.  02/29/16   Cleon Gustin, MD  ferrous sulfate 325 (65 FE) MG EC tablet Take 325 mg by mouth daily.     [provider]  finasteride (PROSCAR) 5 MG tablet Take 5 mg by mouth daily.   04/16/15   [provider]  glimepiride (AMARYL) 2 MG tablet Take 2 mg by mouth daily with breakfast.    [provider]  loratadine (CLARITIN) 10 MG tablet Take 10 mg by mouth every morning.     [provider]  Memantine HCl-Donepezil HCl (NAMZARIC) 28-10 MG CP24 Take 1 tablet by mouth daily.     [provider]  montelukast (SINGULAIR) 10 MG tablet Take 10 mg by mouth every morning.     [provider]  Multiple Vitamins-Minerals (MULTIVITAMIN ADULT PO) Take 1 tablet by mouth every morning.     [provider]  nitroGLYCERIN (NITROSTAT) 0.4 MG SL tablet Place 0.4 mg under the tongue every 5 (five) minutes as needed for chest pain.     [provider]  pantoprazole (PROTONIX) 40 MG tablet Take 40 mg by mouth 2 (two) times daily.     [provider]  predniSONE (DELTASONE) 10 MG tablet Take 10 mg by mouth daily with breakfast.    [provider]  roflumilast (DALIRESP) 500 MCG TABS tablet Take 250 mcg by mouth daily.     [provider]  sitaGLIPtan-metformin (JANUMET) 50-500 MG per tablet Take 1 tablet by mouth 2 (two) times daily with a meal.      [provider]  solifenacin (VESICARE) 5 MG tablet Take 5 mg by mouth every morning.     [provider]  Tamsulosin HCl (FLOMAX) 0.4 MG CAPS Take 0.4 mg by mouth daily.     [provider]  telmisartan (MICARDIS) 80 MG tablet Take 80 mg by mouth every morning.     [provider]  tiotropium (SPIRIVA) 18 MCG inhalation capsule Place 18 mcg into inhaler and inhale daily.     [provider]  vitamin C (ASCORBIC ACID) 500 MG tablet Take 500 mg by mouth every evening.    [provider]                                                                                                                                    Past Surgical History Past Surgical History:  Procedure Laterality Date  . BACK SURGERY      lower back  . Cataract surgery     1 eye done.  . CORONARY ANGIOPLASTY WITH STENT  PLACEMENT  07/2003   Est. EF of 65% with stent to the RCA, by Dr. Acie Fredrickson  . CYSTOSCOPY WITH INSERTION OF UROLIFT N/A 02/25/2016   Procedure: CYSTOSCOPY WITH INSERTION OF UROLIFT x6;  Surgeon: Cleon Gustin, MD;  Location: [facility];  Service: Urology;  Laterality: N/A;  1 HOUR    . INNER EAR SURGERY    . JOINT REPLACEMENT Right   . L5 laminectomy  2004   Dr. Sherwood Gambler  . right total knee  2006   Dr. Maureen Ralphs   Family History Family History  Problem Relation Age of Onset  . Lung disease Father   . Asthma Father   . Sudden death Mother        unknown causes  . Lung disease Brother   . Asthma Brother   . Sudden death Brother 10       unknown causes  . Sudden death Sister        unknown causes  . Sudden death Sister        unknown causes    Social History Social History   Tobacco Use  . Smoking status: Former Smoker    Packs/day: 1.00    Years: 43.00    Pack years: 43.00    Types: Cigarettes    Last attempt to quit: 05/02/1988    Years since quitting: 29.0  . Smokeless tobacco: Never Used  Substance Use Topics  . Alcohol use: Yes    Comment: RARE-social  . Drug use: No   Allergies Ciprofloxacin hcl; Mirabegron; Penicillins; Sulfa antibiotics; Sulfamethoxazole; and Sulfonamide derivatives  Review of Systems Review of Systems All other systems are reviewed and are negative for acute change except as noted in the HPI  Physical Exam Vital Signs  I have reviewed the triage vital signs BP (!) 178/78   Pulse 68   Temp 98.4 F (36.9 C) (Oral)   Resp 16   SpO2 99%    Physical Exam  Constitutional: He is oriented to person, place, and time. He appears well-developed and well-nourished. No distress.  HENT:  Head: Normocephalic.    Right Ear: External ear normal.  Left Ear: External ear normal.  Mouth/Throat: Oropharynx is clear and moist.  Eyes: Conjunctivae and EOM are normal.  Pupils are equal, round, and reactive to light. Right eye exhibits no discharge. Left eye exhibits no discharge. No scleral icterus.  Neck: Normal range of motion. Neck supple.  Cardiovascular: Regular rhythm and normal heart sounds. Exam reveals no gallop and no friction rub.  No murmur heard. Pulses:      Radial pulses are 2+ on the right side, and 2+ on the left side.       Dorsalis pedis pulses are 2+ on the right side, and 2+ on the left side.  Pulmonary/Chest: Effort normal and breath sounds normal. No stridor. No respiratory distress.  Abdominal: Soft. He exhibits no distension. There is no tenderness.  Musculoskeletal:       Cervical back: He exhibits no bony tenderness.       Thoracic back: He exhibits no bony tenderness.       Lumbar back: He exhibits no bony tenderness.  Clavicle stable. Chest stable to AP/Lat compression. Pelvis stable to Lat compression. No obvious extremity deformity. No chest or abdominal wall contusion.  Neurological: He is alert and oriented to person, place, and time. GCS eye subscore is 4. GCS verbal subscore is 5. GCS motor subscore is 6.  Mental Status:  Alert and oriented to  person, place, and time.  Attention and concentration normal.  Speech clear.  Recent memory is intact  Cranial Nerves:  II Visual Fields: Intact to confrontation. Visual fields intact. III, IV, VI: Pupils equal and reactive to light and near. Full eye movement without nystagmus  V Facial Sensation: Normal. No weakness of masticatory muscles  VII: No facial weakness or asymmetry  VIII Auditory Acuity: Grossly normal  IX/X: The uvula is midline; the palate elevates symmetrically  XI: Normal sternocleidomastoid and trapezius strength  XII: The tongue is midline. No atrophy or fasciculations.   Motor System: Muscle Strength: 5/5 and symmetric in the upper and lower extremities. No pronation or drift.  Muscle Tone: Tone and muscle bulk are normal in the upper and lower  extremities.   Reflexes: DTRs: 1+ and symmetrical in all four extremities. No Clonus Coordination: Intact finger-to-nose. No tremor.  Sensation: Intact to light touch, and pinprick.  Gait: deferred    Skin: Skin is warm. He is not diaphoretic.    ED Results and Treatments Labs (all labs ordered are listed, but only abnormal results are displayed) Labs Reviewed  CBC WITH DIFFERENTIAL/PLATELET - Abnormal; Notable for the following components:      Result Value   WBC 14.7 (*)    Hemoglobin 11.5 (*)    HCT 36.5 (*)    Neutro Abs 11.7 (*)    Eosinophils Absolute 1.0 (*)    All other components within normal limits  BASIC METABOLIC PANEL - Abnormal; Notable for the following components:   Glucose, Bld 152 (*)    BUN 26 (*)    Creatinine, Ser 1.39 (*)    GFR calc non Af Amer 44 (*)    GFR calc Af Amer 51 (*)    All other components within normal limits  CBG MONITORING, ED - Abnormal; Notable for the following components:   Glucose-Capillary 146 (*)    All other components within normal limits  CBC  BASIC METABOLIC PANEL  TSH  MAGNESIUM  TROPONIN I  TROPONIN I  I-STAT TROPONIN, ED                                                                                                                         EKG  EKG Interpretation  Date/Time:  Sunday May 07 2017 16:54:30 EST Ventricular Rate:  61 PR Interval:    QRS Duration: 80 QT Interval:  411 QTC Calculation: 414 R Axis:   47 Text Interpretation:  Sinus rhythm Ventricular trigeminy Abnormal R-wave progression, early transition Minimal ST depression, inferior leads NO STEMI Confirmed by Addison Lank [phone]) on 05/07/2017 5:17:15 PM      Radiology Ct Head Wo Contrast  Result Date: 05/07/2017 CLINICAL DATA:  Syncope. EXAM: CT HEAD WITHOUT CONTRAST TECHNIQUE: Contiguous axial images were obtained from the base of the skull through the vertex without intravenous contrast. COMPARISON:  Sep 24, 2014 FINDINGS: Brain: No  subdural, epidural, or subarachnoid hemorrhage. Cerebellum, brainstem, and basal cisterns are normal.  Ventricles and sulci are unchanged. Severe white matter changes are stable. No acute cortical ischemia or infarct. No mass effect or midline shift. Vascular: Calcified atherosclerosis in the intracranial carotids. Skull: Normal. Negative for fracture or focal lesion. Sinuses/Orbits: There is a mucous retention cyst in the left maxillary sinus. Mucosal thickening scattered throughout the ethmoid sinuses and both maxillary sinuses is identified. Mastoid air cells on the left are normal. Chronic changes are seen in the right mastoid bone and right middle ear, unchanged since May 2016. Visualized bones otherwise normal. Other: There is anterolisthesis of C2 versus C3 measuring 3 mm on the scout view, not seen on the December 07, 2006 CT scan. No definitive acute soft tissue swelling. The scout view is otherwise unremarkable. There is a laceration over the left side of the forehead. IMPRESSION: 1. There is anterolisthesis of C2 versus C3, measuring 3 mm, on the scout view. This was not present in August of 2008 and is age indeterminate. There is no definitive associated soft tissue swelling. If there is concern in the cervical spine, recommend dedicated cross-sectional imaging. 2. No acute intracranial abnormalities. Chronic white matter changes identified. 3. Sinus disease as above. Chronic changes in the right mastoid bone and middle ear. Electronically Signed   By: Dorise Bullion III M.D   On: 05/07/2017 18:49   Pertinent labs & imaging results that were available during my care of the patient were reviewed by me and considered in my medical decision making (see chart for details).  Medications Ordered in ED Medications  enoxaparin (LOVENOX) injection 40 mg (not administered)  acetaminophen (TYLENOL) tablet 650 mg (not administered)    Or  acetaminophen (TYLENOL) suppository 650 mg (not administered)    ondansetron (ZOFRAN) tablet 4 mg (not administered)    Or  ondansetron (ZOFRAN) injection 4 mg (not administered)  albuterol (PROVENTIL) (2.5 MG/3ML) 0.083% nebulizer solution 2.5 mg (not administered)  lidocaine-EPINEPHrine (XYLOCAINE W/EPI) 2 %-1:200000 (PF) injection 10 mL (10 mLs Intradermal Given by Other 05/07/17 2035)                                                                                                                                    Procedures .Marland KitchenLaceration Repair Date/Time: 05/07/2017 8:36 PM Performed by: Fatima Blank, MD Authorized by: Fatima Blank, MD   Consent:    Consent obtained:  Verbal   Consent given by:  Patient and spouse   Risks discussed:  Poor cosmetic result   Alternatives discussed:  Delayed treatment Anesthesia (see MAR for exact dosages):    Anesthesia method:  Local infiltration   Local anesthetic:  Lidocaine 1% w/o epi Laceration details:    Location:  Face   Face location:  Forehead   Length (cm):  1.5   Depth (mm):  5 Repair type:    Repair type:  Intermediate Pre-procedure details:    Preparation:  Patient was prepped and draped in usual  sterile fashion and imaging obtained to evaluate for foreign bodies Exploration:    Hemostasis achieved with:  Direct pressure   Wound exploration: wound explored through full range of motion and entire depth of wound probed and visualized     Wound extent: no foreign bodies/material noted     Contaminated: no   Treatment:    Area cleansed with:  Betadine   Amount of cleaning:  Extensive   Irrigation solution:  Sterile saline   Irrigation volume:  500 cc   Irrigation method:  Syringe   Visualized foreign bodies/material removed: no   Subcutaneous repair:    Suture size:  3-0   Suture material:  Vicryl   Number of sutures:  2 Skin repair:    Repair method:  Sutures   Suture size:  5-0   Suture material:  Fast-absorbing gut   Suture technique:  Running   Number of sutures:   6 Approximation:    Approximation:  Close   Vermilion border: well-aligned   Post-procedure details:    Patient tolerance of procedure:  Tolerated well, no immediate complications .Marland KitchenLaceration Repair Date/Time: 05/07/2017 8:37 PM Performed by: Fatima Blank, MD Authorized by: Fatima Blank, MD   Laceration details:    Length (cm):  0.3   Depth (mm):  2 Repair type:    Repair type:  Simple Pre-procedure details:    Preparation:  Patient was prepped and draped in usual sterile fashion and imaging obtained to evaluate for foreign bodies Exploration:    Wound extent: no foreign bodies/material noted     Contaminated: no   Treatment:    Area cleansed with:  Betadine   Amount of cleaning:  Extensive   Irrigation solution:  Sterile saline   Irrigation volume:  100 cc   Visualized foreign bodies/material removed: no   Skin repair:    Repair method:  Sutures   Suture size:  5-0   Suture material:  Fast-absorbing gut   Suture technique:  Simple interrupted   Number of sutures:  1    (including critical care time)  Medical Decision Making / ED Course I have reviewed the nursing notes for this encounter and the patient's prior records (if available in EHR or on provided paperwork).    Unwitnessed fall possible syncope.  EKG with trigeminy.  Initial troponin negative.  Rest of the labs at patient's baseline and grossly reassuring.  Patient is on Plavix and aspirin no other anticoagulation.  CT head without ICH.  Wounds irrigated and closed as above.  Discussed case with cardiology who will evaluate the patient during admission.  Admitted to medicine for possible syncope workup.  Final Clinical Impression(s) / ED Diagnoses Final diagnoses:  Unwitnessed fall  Facial injury, initial encounter  Laceration of forehead, initial encounter  Laceration of nose, initial encounter  Syncope, unspecified syncope type      This chart was dictated using voice recognition  software.  Despite best efforts to proofread,  errors can occur which can change the documentation meaning.   Fatima Blank, MD 05/07/17 2046

## 2017-05-07 NOTE — Progress Notes (Signed)
Please call me if cardiac issues were to come up to night.  [name] [phone]

## 2017-05-07 NOTE — ED Notes (Signed)
Family at bedside. 

## 2017-05-07 NOTE — ED Notes (Signed)
Pt aware that a urine sample is needed.  

## 2017-05-07 NOTE — H&P (Signed)
History and Physical    [name]:[id number] DOB: 17-Oct-1928 DOA: 05/07/2017  Referring MD/NP/PA: Dr. Addison Lank  PCP: Leanna Battles, MD  Patient coming from: Home via EMS  Chief Complaint: Fall   I have personally briefly reviewed patient's old medical records in [place]   HPI: [name] is a 84 y.o. male with medical history significant of HTN, HLD, DM type II, CAD s/p DES to RCA, dCHF (EF 65-70% w/ grade 1DD in 01/2013), COPD, OSA, dementia, and BPH; who presents after having a fall while getting out of the shower.  History is obtained mostly from his family as he has dementia with poor short-term memory. Patient was found on the floor between the shower and toilet facedown by his wife who heard him fall and noted to have been out for 2-3 minutes.  There was no note that the patient ever stopped breathing and unclear if his wife checked for a pulse at that time.  He sustained a cut above his left eye and the bridge of his nose.  Family suspects that he sustained cuts from the edge of the shower.  His wife reported that the patient regained consciousness briefly before losing consciousness again for 2-3 minutes prior to EMS arrival.  Family states the patient did complain of some mild right-sided shoulder pain.  Otherwise he chronically has wheezing per patient's wife.  Patient has been in otherwise in his normal state of health, and son notes that that have difficulty with getting him to drink enough fluids.  Family notes that he normally ambulates without need of assistance of the p.m. that his gait appears to be unsteady at times.  En route with EMS the patient was seen to have frequent PVCs and was placed on 4 L of nasal cannula oxygen, CBG at that time was 156, blood pressure 116/55, pulse 64, and irregular.  ED Course: Upon admission into the emergency department patient no initial temperature was obtained, blood pressure 122/68 -150/89 pulse 60-67, respirations  12-18 and O2 saturation maintained on room air. Labs revealed WBC 14.7, hemoglobin 11.5, BUN 26, creatinine 1.39, and troponin 0.  Initial EKG revealed signs of trigeminy.  CT scan of the brain showed no acute abnormalities.  Dr. Einar Gip of  Cardiology consulted to evaluate the patient due to the trigeminy seen as they note no previous history of arrhythmias.  Dr.Cardama  sutured the lacerations above the patient's left eye and bridge of nose.  TRH called to admit for observation overnight.   Review of Systems  Unable to perform ROS: Dementia  Constitutional: Negative for chills and fever.  HENT: Negative for ear discharge and nosebleeds.   Eyes: Negative for photophobia and pain.  Respiratory: Positive for wheezing. Negative for cough.   Cardiovascular: Negative for chest pain and leg swelling.  Gastrointestinal: Negative for abdominal pain.  Genitourinary: Negative for dysuria and frequency.  Musculoskeletal: Positive for falls and joint pain. Negative for back pain.  Neurological: Positive for loss of consciousness. Negative for speech change.  Psychiatric/Behavioral: Positive for memory loss. Negative for substance abuse.    Past Medical History:  Diagnosis Date  . Anemia   . Asthma   . Benign prostatic hypertrophy   . Bilateral sensorineural hearing loss   . CAD (coronary artery disease) 07/2003   a.  3.0 x 28 mm Taxus DES in the mid RCA in 2005  . COPD (chronic obstructive pulmonary disease) ([place])   . Diverticulosis   . DJD (  degenerative joint disease)   . DM (diabetes mellitus) ([place])   . Dyslipidemia   . GERD (gastroesophageal reflux disease)   . Hemorrhoids   . Hiatal hernia   . Hypertension   . OSA (obstructive sleep apnea)    cpap use  . Osteoarthritis   . Overactive bladder   . Rhinitis   . Seasonal allergies   . Shingles   . Stroke ([place])   . TIA (transient ischemic attack) 12/2006   Dr. Leonie Man    Past Surgical History:  Procedure Laterality Date  . BACK SURGERY      lower back  . Cataract surgery     1 eye done.  . CORONARY ANGIOPLASTY WITH STENT PLACEMENT  07/2003   Est. EF of 65% with stent to the RCA, by Dr. Acie Fredrickson  . CYSTOSCOPY WITH INSERTION OF UROLIFT N/A 02/25/2016   Procedure: CYSTOSCOPY WITH INSERTION OF UROLIFT x6;  Surgeon: Cleon Gustin, MD;  Location: [facility];  Service: Urology;  Laterality: N/A;  1 HOUR    . INNER EAR SURGERY    . JOINT REPLACEMENT Right   . L5 laminectomy  2004   Dr. Sherwood Gambler  . right total knee  2006   Dr. Maureen Ralphs     reports that he quit smoking about 29 years ago. His smoking use included cigarettes. He has a 43.00 pack-year smoking history. he has never used smokeless tobacco. He reports that he drinks alcohol. He reports that he does not use drugs.  Allergies  Allergen Reactions  . Ciprofloxacin Hcl Other (See Comments)    Unknown   . Mirabegron Other (See Comments)    FATIGUE  . Penicillins     Has patient had a PCN reaction causing immediate rash, facial/tongue/throat swelling, SOB or lightheadedness with hypotension: Unknown Has patient had a PCN reaction causing severe rash involving mucus membranes or skin necrosis: Unknown Has patient had a PCN reaction that required hospitalization [place] Has patient had a PCN reaction occurring within the last 10 years: Unknown If all of the above answers are "NO", then may proceed with Cephalosporin use.   . Sulfa Antibiotics Hives and Rash  . Sulfamethoxazole Rash  . Sulfonamide Derivatives Rash    Family History  Problem Relation Age of Onset  . Lung disease Father   . Asthma Father   . Sudden death Mother        unknown causes  . Lung disease Brother   . Asthma Brother   . Sudden death Brother 30       unknown causes  . Sudden death Sister        unknown causes  . Sudden death Sister        unknown causes    Prior to Admission medications   Medication Sig Start Date End Date Taking? Authorizing Provider  albuterol (PROVENTIL) (2.5  MG/3ML) 0.083% nebulizer solution Take 0.83 mg by nebulization 3 (three) times daily.     [provider]  amLODipine (NORVASC) 5 MG tablet Take 5 mg by mouth every morning.     [provider]  aspirin 81 MG tablet Take 81 mg by mouth daily.     [provider]  atorvastatin (LIPITOR) 20 MG tablet Take 20 mg by mouth every morning.     [provider]  budesonide-formoterol (SYMBICORT) 160-4.5 MCG/ACT inhaler Inhale 1 Dose into the lungs 2 (two) times daily.    [provider]  BYSTOLIC 5 MG tablet Take 5 mg by mouth  every morning.  05/03/16   [provider]  Cholecalciferol (VITAMIN D) 2000 units CAPS Take 1,000 Units by mouth 2 (two) times daily.     [provider]  clopidogrel (PLAVIX) 75 MG tablet Take 1 tablet (75 mg total) by mouth daily. Patient taking differently: Take 75 mg by mouth every morning.  02/29/16   Cleon Gustin, MD  ferrous sulfate 325 (65 FE) MG EC tablet Take 325 mg by mouth daily.     [provider]  finasteride (PROSCAR) 5 MG tablet Take 5 mg by mouth daily.  04/16/15   [provider]  glimepiride (AMARYL) 2 MG tablet Take 2 mg by mouth daily with breakfast.    [provider]  loratadine (CLARITIN) 10 MG tablet Take 10 mg by mouth every morning.     [provider]  Memantine HCl-Donepezil HCl (NAMZARIC) 28-10 MG CP24 Take 1 tablet by mouth daily.     [provider]  montelukast (SINGULAIR) 10 MG tablet Take 10 mg by mouth every morning.     [provider]  Multiple Vitamins-Minerals (MULTIVITAMIN ADULT PO) Take 1 tablet by mouth every morning.     [provider]  nitroGLYCERIN (NITROSTAT) 0.4 MG SL tablet Place 0.4 mg under the tongue every 5 (five) minutes as needed for chest pain.     [provider]  pantoprazole (PROTONIX) 40 MG tablet Take 40 mg by mouth 2 (two) times daily.     [provider]  predniSONE  (DELTASONE) 10 MG tablet Take 10 mg by mouth daily with breakfast.    [provider]  roflumilast (DALIRESP) 500 MCG TABS tablet Take 250 mcg by mouth daily.     [provider]  sitaGLIPtan-metformin (JANUMET) 50-500 MG per tablet Take 1 tablet by mouth 2 (two) times daily with a meal.      [provider]  solifenacin (VESICARE) 5 MG tablet Take 5 mg by mouth every morning.     [provider]  Tamsulosin HCl (FLOMAX) 0.4 MG CAPS Take 0.4 mg by mouth daily.     [provider]  telmisartan (MICARDIS) 80 MG tablet Take 80 mg by mouth every morning.     [provider]  tiotropium (SPIRIVA) 18 MCG inhalation capsule Place 18 mcg into inhaler and inhale daily.     [provider]  vitamin C (ASCORBIC ACID) 500 MG tablet Take 500 mg by mouth every evening.    [provider]    Physical Exam:  Constitutional:Elderly male in NAD, calm, comfortable Vitals:   05/07/17 1715 05/07/17 1730 05/07/17 1830 05/07/17 1900  BP: 122/68 140/64 127/72 (!) 150/89  Pulse: 62 61 65 67  Resp: 17 12 18 15   SpO2: 100% 99% 100% 99%   Eyes: PERRL, lids and conjunctivae normal ENMT: Mucous membranes are moist. Posterior pharynx clear of any exudate or lesions. Neck: normal, supple, no masses, no thyromegaly Respiratory: Regular respiratory effort with and expiratory wheeze appreciated.  No rhonchi or rales noted. Cardiovascular: Regular rate and rhythm, no murmurs / rubs / gallops. No extremity edema. 2+ pedal pulses. No carotid bruits.  Abdomen: no tenderness, no masses palpated. No hepatosplenomegaly. Bowel sounds positive.  Musculoskeletal: no clubbing / cyanosis. No joint deformity upper and lower extremities. Good ROM, no contractures. Normal muscle tone.  Skin: Sutured 3 4 cm laceration across right brow and 2 cm 1 across the bridge of the nose. Neurologic: CN 2-12 grossly intact. Sensation intact, DTR  normal. Strength 5/5 in all 4.    Psychiatric: Poor recent memory. Alert and oriented x 2. Normal mood.     Labs on Admission: I have personally reviewed following labs and imaging studies  CBC: Recent Labs  Lab 05/07/17 1652  WBC 14.7*  NEUTROABS 11.7*  HGB 11.5*  HCT 36.5*  MCV 84.5  PLT 027   Basic Metabolic Panel: Recent Labs  Lab 05/07/17 1652  NA 139  K 4.6  CL 104  CO2 25  GLUCOSE 152*  BUN 26*  CREATININE 1.39*  CALCIUM 9.1   GFR: CrCl cannot be calculated (Unknown ideal weight.). Liver Function Tests: No results for input(s): AST, ALT, ALKPHOS, BILITOT, PROT, ALBUMIN in the last 168 hours. No results for input(s): LIPASE, AMYLASE in the last 168 hours. No results for input(s): AMMONIA in the last 168 hours. Coagulation Profile: No results for input(s): INR, PROTIME in the last 168 hours. Cardiac Enzymes: No results for input(s): CKTOTAL, CKMB, CKMBINDEX, TROPONINI in the last 168 hours. BNP (last 3 results) No results for input(s): PROBNP in the last 8760 hours. HbA1C: No results for input(s): HGBA1C in the last 72 hours. CBG: Recent Labs  Lab 05/07/17 1649  GLUCAP 146*   Lipid Profile: No results for input(s): CHOL, HDL, LDLCALC, TRIG, CHOLHDL, LDLDIRECT in the last 72 hours. Thyroid Function Tests: No results for input(s): TSH, T4TOTAL, FREET4, T3FREE, THYROIDAB in the last 72 hours. Anemia Panel: No results for input(s): VITAMINB12, FOLATE, FERRITIN, TIBC, IRON, RETICCTPCT in the last 72 hours. Urine analysis:    Component Value Date/Time   COLORURINE YELLOW 09/24/2014 [place] 09/24/2014 1442   LABSPEC 1.012 09/24/2014 1442   PHURINE 7.0 09/24/2014 1442   GLUCOSEU NEGATIVE 09/24/2014 1442   HGBUR NEGATIVE 09/24/2014 1442   BILIRUBINUR NEGATIVE 09/24/2014 1442   KETONESUR NEGATIVE 09/24/2014 1442   PROTEINUR NEGATIVE 09/24/2014 1442   UROBILINOGEN 0.2 09/24/2014 1442   NITRITE NEGATIVE 09/24/2014 1442   LEUKOCYTESUR TRACE (A) 09/24/2014 1442    Sepsis Labs: No results found for this or any previous visit (from the past 240 hour(s)).   Radiological Exams on Admission: Ct Head Wo Contrast  Result Date: 05/07/2017 CLINICAL DATA:  Syncope. EXAM: CT HEAD WITHOUT CONTRAST TECHNIQUE: Contiguous axial images were obtained from the base of the skull through the vertex without intravenous contrast. COMPARISON:  Sep 24, 2014 FINDINGS: Brain: No subdural, epidural, or subarachnoid hemorrhage. Cerebellum, brainstem, and basal cisterns are normal. Ventricles and sulci are unchanged. Severe white matter changes are stable. No acute cortical ischemia or infarct. No mass effect or midline shift. Vascular: Calcified atherosclerosis in the intracranial carotids. Skull: Normal. Negative for fracture or focal lesion. Sinuses/Orbits: There is a mucous retention cyst in the left maxillary sinus. Mucosal thickening scattered throughout the ethmoid sinuses and both maxillary sinuses is identified. Mastoid air cells on the left are normal. Chronic changes are seen in the right mastoid bone and right middle ear, unchanged since May 2016. Visualized bones otherwise normal. Other: There is anterolisthesis of C2 versus C3 measuring 3 mm on the scout view, not seen on the December 07, 2006 CT scan. No definitive acute soft tissue swelling. The scout view is otherwise unremarkable. There is a laceration over the left side of the forehead. IMPRESSION: 1. There is anterolisthesis of C2 versus C3, measuring 3 mm, on the scout view. This was not present in August of 2008 and is age indeterminate. There is no definitive associated soft tissue swelling. If there  is concern in the cervical spine, recommend dedicated cross-sectional imaging. 2. No acute intracranial abnormalities. Chronic white matter changes identified. 3. Sinus disease as above. Chronic changes in the right mastoid bone and middle ear. Electronically Signed   By: Dorise Bullion III M.D   On: 05/07/2017 18:49    EKG:  Independently reviewed.  Sinus rhythm with ventricular tachycardia  Assessment/Plan Fall 2/2 possible syncopal episode: Patient noted to have been taking a shower when he had fallen with note of loss of consciousness found between the shower in the toilet face down noted to be unconscious for 2-3 minutes before regaining consciousness.  Thereafter noted to have second brief episode of loss of consciousness.  EKG showing signs of bigeminy.  Question syncope (vasovagal/orthostatic vs arrhythmia) vs mechanical fall   - Admit to a telemetry bed - Check orthostatics when able - Trend troponins - Check echocardiogram - Follow-up telemetry overnight  Leukocytosis: WBC elevated at 14.7.  Report no complaints of fever or significant cough. - Check urinalysis and chest x-ray  Addendum Urinary tract infection: Acute.  Urinalysis came back positive for signs of infection.  As a possible cause for elevated - Follow-up urine culture - Rocephin  Laceration to the left eye: Patient received sutures in the ED no acute intracranial abnormality noted.   CAD: s/p DES to the RCA in 2005 previously followed by Dr. Cathie Olden - Continue Plavix and aspirin  Possible dehydration: Patient noted to have a increase in his BUN slightly increased. -Gentle fluids normal saline at 75 mL/h  Diabetes mellitus type 2: Last available hemoglobin A1c noted to be 8.3 on 02/23/2016 - Hypoglycemic protocol - Hold Amaryl and Janumet - Continue home insulin regimen of 4 units after lunch and dinner  Essential hypertension - Continue nebivolol, amlodipine, and pharmacy substitution of irbesartan for Micardis,  COPD, without acute exacerbation: Patient on chronic prednisone at 5 mg daily - Continue prednisone, fluticasone-umeclidin, Spiriva  Dementia: Patient with very poor short-term memory - Continue Aricept and Namenda  Chronic kidney disease stage III: Creatinine 1.39 on admission which appears similar to previous check  in 2017. - Recheck CBC  BPH - continue finasteride and tamsulosin  OSA on CPAP - RT to supply CPAP  GERD -Continue Protonix  DVT prophylaxis: lovenox Code Status: frull Family Communication: Discussed plan of care with the patient and family present at bedside Disposition Plan: Likely discharge home in 1-2 days Consults called: Dr. Einar Gip Admission status: Observation  Norval Morton MD [facility] Pager [phone]   If 7PM-7AM, please contact night-coverage [email or web address] Password [facility]  05/07/2017, 7:32 PM

## 2017-05-08 ENCOUNTER — Observation Stay (HOSPITAL_COMMUNITY): Payer: Medicare Other

## 2017-05-08 DIAGNOSIS — F0390 Unspecified dementia without behavioral disturbance: Secondary | ICD-10-CM

## 2017-05-08 DIAGNOSIS — W19XXXA Unspecified fall, initial encounter: Secondary | ICD-10-CM | POA: Diagnosis not present

## 2017-05-08 DIAGNOSIS — G4733 Obstructive sleep apnea (adult) (pediatric): Secondary | ICD-10-CM

## 2017-05-08 DIAGNOSIS — S0993XA Unspecified injury of face, initial encounter: Secondary | ICD-10-CM | POA: Diagnosis not present

## 2017-05-08 LAB — ECHOCARDIOGRAM COMPLETE
AVLVOTPG: 7 mmHg
CHL CUP DOP CALC LVOT VTI: 29.3 cm
E decel time: 275 msec
E/e' ratio: 18.07
FS: 30 % (ref 28–44)
IV/PV OW: 0.99
LA vol: 54.1 mL
LADIAMINDEX: 2.28 cm/m2
LASIZE: 43 mm
LAVOLA4C: 48 mL
LAVOLIN: 28.7 mL/m2
LDCA: 3.14 cm2
LEFT ATRIUM END SYS DIAM: 43 mm
LV E/e'average: 18.07
LV PW d: 14.3 mm — AB (ref 0.6–1.1)
LV TDI E'MEDIAL: 6.2
LV e' LATERAL: 6.31 cm/s
LVEEMED: 18.07
LVOT SV: 92 mL
LVOT peak vel: 135 cm/s
LVOTD: 20 mm
Lateral S' vel: 12.3 cm/s
MV Dec: 275
MV pk A vel: 156 m/s
MVPG: 5 mmHg
MVPKEVEL: 114 m/s
TAPSE: 25.9 mm
TDI e' lateral: 6.31

## 2017-05-08 LAB — URINALYSIS, ROUTINE W REFLEX MICROSCOPIC
BILIRUBIN URINE: NEGATIVE
Glucose, UA: 50 mg/dL — AB
Ketones, ur: NEGATIVE mg/dL
NITRITE: NEGATIVE
PROTEIN: 30 mg/dL — AB
SQUAMOUS EPITHELIAL / LPF: NONE SEEN
Specific Gravity, Urine: 1.018 (ref 1.005–1.030)
pH: 5 (ref 5.0–8.0)

## 2017-05-08 LAB — CBC
HEMATOCRIT: 37.2 % — AB (ref 39.0–52.0)
HEMOGLOBIN: 11.7 g/dL — AB (ref 13.0–17.0)
MCH: 26.2 pg (ref 26.0–34.0)
MCHC: 31.5 g/dL (ref 30.0–36.0)
MCV: 83.4 fL (ref 78.0–100.0)
Platelets: 183 10*3/uL (ref 150–400)
RBC: 4.46 MIL/uL (ref 4.22–5.81)
RDW: 14.1 % (ref 11.5–15.5)
WBC: 14.4 10*3/uL — AB (ref 4.0–10.5)

## 2017-05-08 LAB — BASIC METABOLIC PANEL
ANION GAP: 9 (ref 5–15)
BUN: 24 mg/dL — AB (ref 6–20)
CHLORIDE: 106 mmol/L (ref 101–111)
CO2: 24 mmol/L (ref 22–32)
Calcium: 9 mg/dL (ref 8.9–10.3)
Creatinine, Ser: 1.19 mg/dL (ref 0.61–1.24)
GFR calc Af Amer: >60 mL/min (ref >60)
GFR calc non Af Amer: 53 mL/min — ABNORMAL LOW (ref >60)
GLUCOSE: 95 mg/dL (ref 65–99)
POTASSIUM: 4.1 mmol/L (ref 3.5–5.1)
Sodium: 139 mmol/L (ref 135–145)

## 2017-05-08 LAB — TSH: TSH: 2.021 u[IU]/mL (ref 0.350–4.500)

## 2017-05-08 LAB — GLUCOSE, CAPILLARY
GLUCOSE-CAPILLARY: 153 mg/dL — AB (ref 65–99)
GLUCOSE-CAPILLARY: 202 mg/dL — AB (ref 65–99)
Glucose-Capillary: 150 mg/dL — ABNORMAL HIGH (ref 65–99)

## 2017-05-08 LAB — TROPONIN I: Troponin I: <0.03 ng/mL (ref <0.03)

## 2017-05-08 MED ORDER — DEXTROSE 5 % IV SOLN: 1.0000 g | INTRAVENOUS | Status: DC

## 2017-05-08 MED ORDER — CEFUROXIME AXETIL 500 MG PO TABS
500.0000 mg | ORAL_TABLET | Freq: Two times a day (BID) | ORAL | Status: DC
  Administered 2017-05-08 – 2017-05-10 (×4): 500 mg via ORAL
  Filled 2017-05-08 (×5): qty 1

## 2017-05-08 MED ORDER — ALBUTEROL SULFATE (2.5 MG/3ML) 0.083% IN NEBU
2.5000 mg | INHALATION_SOLUTION | Freq: Two times a day (BID) | RESPIRATORY_TRACT | Status: DC
  Administered 2017-05-08 – 2017-05-10 (×4): 2.5 mg via RESPIRATORY_TRACT
  Filled 2017-05-08 (×4): qty 3

## 2017-05-08 MED ORDER — INSULIN ASPART 100 UNIT/ML ~~LOC~~ SOLN
0.0000 [IU] | Freq: Three times a day (TID) | SUBCUTANEOUS | Status: DC
  Administered 2017-05-09: 7 [IU] via SUBCUTANEOUS
  Administered 2017-05-09 – 2017-05-10 (×2): 3 [IU] via SUBCUTANEOUS

## 2017-05-08 MED ORDER — UMECLIDINIUM BROMIDE 62.5 MCG/INH IN AEPB
1.0000 | INHALATION_SPRAY | Freq: Every day | RESPIRATORY_TRACT | Status: DC
  Administered 2017-05-08 – 2017-05-10 (×3): 1 via RESPIRATORY_TRACT
  Filled 2017-05-08: qty 7

## 2017-05-08 MED ORDER — DEXTROSE 5 % IV SOLN
2.0000 g | Freq: Once | INTRAVENOUS | Status: AC
  Administered 2017-05-08: 2 g via INTRAVENOUS
  Filled 2017-05-08: qty 2

## 2017-05-08 MED ORDER — FLUTICASONE FUROATE-VILANTEROL 100-25 MCG/INH IN AEPB
1.0000 | INHALATION_SPRAY | Freq: Every day | RESPIRATORY_TRACT | Status: DC
  Administered 2017-05-08 – 2017-05-10 (×3): 1 via RESPIRATORY_TRACT
  Filled 2017-05-08: qty 28

## 2017-05-08 MED ORDER — INSULIN ASPART 100 UNIT/ML ~~LOC~~ SOLN: 0.0000 [IU] | Freq: Every day | SUBCUTANEOUS | Status: DC

## 2017-05-08 MED ORDER — SODIUM CHLORIDE 0.9 % IV SOLN
INTRAVENOUS | Status: DC
  Administered 2017-05-08: 1000 mL via INTRAVENOUS

## 2017-05-08 NOTE — ED Notes (Signed)
Patient resting on hospital bed, wife at bedside.

## 2017-05-08 NOTE — ED Notes (Signed)
Collected gold top for tsh blood test.

## 2017-05-08 NOTE — ED Notes (Signed)
Pt now on hospital bed, wife in recliner at bedside

## 2017-05-08 NOTE — ED Notes (Signed)
Patient up to bathroom with assistance of his wife only . Gait steady.

## 2017-05-08 NOTE — ED Notes (Signed)
Ordered tray 

## 2017-05-08 NOTE — Consult Note (Signed)
Reason for Consult:Syncope Referring Physician: Fuller Plan, MD  Colvin [name] is an 84 y.o. male.  HPI: He has history of known coronary artery disease and history of mid RCA stenting in 2005 with no recurrence of angina pectoris.  He has history of COPD and asthma, hypertension, hyperlipidemia, diabetes mellitus and recent worsening dementia.  He had been doing well until yesterday, after taking a shower, came out and his wife heard a loud noise and when she went up andchecked on him, he was completely unconscious, No involuntary movements, no loss of bowel or bladder control, slowly workup and was alert and oriented.  She called the EMS, patient had another episode of syncope while she was waiting EMS arrival.  His vitals were found to be stable, blood sugar was also stable, he was transported to the emergency room.  Since being in the hospital, he has not had any recurrence of syncope.  Has a significant cut left forehead and also has ecchymosis of his eyelids.  Complaints of pain in this region but no headache, visual disturbances, nausea or vomiting.  Denies any shortness of breath except chronic dyspnea and wheezing.  Denies any chest pain or palpitations.  Past Medical History:  Diagnosis Date  . Anemia   . Asthma   . Benign prostatic hypertrophy   . Bilateral sensorineural hearing loss   . CAD (coronary artery disease) 07/2003   a.  3.0 x 28 mm Taxus DES in the mid RCA in 2005  . COPD (chronic obstructive pulmonary disease) ([place])   . Diverticulosis   . DJD (degenerative joint disease)   . DM (diabetes mellitus) ([place])   . Dyslipidemia   . GERD (gastroesophageal reflux disease)   . Hemorrhoids   . Hiatal hernia   . Hypertension   . OSA (obstructive sleep apnea)    cpap use  . Osteoarthritis   . Overactive bladder   . Rhinitis   . Seasonal allergies   . Shingles   . Stroke ([place])   . TIA (transient ischemic attack) 12/2006   Dr. Leonie Man    Past Surgical History:  Procedure  Laterality Date  . BACK SURGERY     lower back  . Cataract surgery     1 eye done.  . CORONARY ANGIOPLASTY WITH STENT PLACEMENT  07/2003   Est. EF of 65% with stent to the RCA, by Dr. Acie Fredrickson  . CYSTOSCOPY WITH INSERTION OF UROLIFT N/A 02/25/2016   Procedure: CYSTOSCOPY WITH INSERTION OF UROLIFT x6;  Surgeon: Cleon Gustin, MD;  Location: [facility];  Service: Urology;  Laterality: N/A;  1 HOUR    . INNER EAR SURGERY    . JOINT REPLACEMENT Right   . L5 laminectomy  2004   Dr. Sherwood Gambler  . right total knee  2006   Dr. Maureen Ralphs    Family History  Problem Relation Age of Onset  . Lung disease Father   . Asthma Father   . Sudden death Mother        unknown causes  . Lung disease Brother   . Asthma Brother   . Sudden death Brother 57       unknown causes  . Sudden death Sister        unknown causes  . Sudden death Sister        unknown causes    Social History:  reports that he quit smoking about 29 years ago. His smoking use included cigarettes. He has a 43.00 pack-year smoking history.  he has never used smokeless tobacco. He reports that he drinks alcohol. He reports that he does not use drugs.  Allergies:  Allergies  Allergen Reactions  . Ciprofloxacin Hcl Other (See Comments)    Unknown reaction  . Mirabegron Other (See Comments)    FATIGUE  . Penicillins     Has patient had a PCN reaction causing immediate rash, facial/tongue/throat swelling, SOB or lightheadedness with hypotension: Unknown Has patient had a PCN reaction causing severe rash involving mucus membranes or skin necrosis: Unknown Has patient had a PCN reaction that required hospitalization Unknown Has patient had a PCN reaction occurring within the last 10 years: Unknown If all of the above answers are "NO", then may proceed with Cephalosporin use.   . Sulfa Antibiotics Hives and Rash  . Sulfamethoxazole Rash  . Sulfonamide Derivatives Rash    Medications:  Prior to Admission:  Medications Prior to  Admission  Medication Sig Dispense Refill Last Dose  . albuterol (PROVENTIL HFA;VENTOLIN HFA) 108 (90 Base) MCG/ACT inhaler Inhale 2 puffs into the lungs every 6 (six) hours as needed for wheezing or shortness of breath.   few days ago  . albuterol (PROVENTIL) (2.5 MG/3ML) 0.083% nebulizer solution Take 0.83 mg by nebulization 2 (two) times daily.    05/07/2017 at am  . amLODipine (NORVASC) 5 MG tablet Take 5 mg by mouth daily.    05/07/2017 at am  . amoxicillin (AMOXIL) 500 MG capsule Take 2,000 mg by mouth See admin instructions. Take 4 capsules (2000 mg) by mouth one hour prior to dental appointment (last appointment November 2018)  0 November 2018  . aspirin EC 81 MG tablet Take 81 mg by mouth daily with supper.   05/06/2017 at pm  . atorvastatin (LIPITOR) 20 MG tablet Take 20 mg by mouth daily.    05/07/2017 at am  . Cholecalciferol (VITAMIN D) 2000 units CAPS Take 2,000 Units by mouth daily with supper.    05/06/2017 at pm  . clopidogrel (PLAVIX) 75 MG tablet Take 1 tablet (75 mg total) by mouth daily. 30 tablet 0 05/07/2017 at am  . donepezil (ARICEPT) 10 MG tablet Take 10 mg by mouth daily with supper.  3 05/06/2017 at pm  . escitalopram (LEXAPRO) 5 MG tablet Take 5 mg by mouth daily with supper.  2 05/06/2017 at pm  . ferrous sulfate 324 (65 Fe) MG TBEC Take 324 mg by mouth daily with supper.   05/06/2017 at pm  . finasteride (PROSCAR) 5 MG tablet Take 5 mg by mouth daily with supper.    05/06/2017 at pm  . Fluticasone-Umeclidin-Vilant (TRELEGY ELLIPTA) 100-62.5-25 MCG/INH AEPB Inhale 1 puff into the lungs daily.   05/07/2017 at am  . insulin lispro (HUMALOG KWIKPEN) 100 UNIT/ML KiwkPen Inject 4 Units into the skin See admin instructions. Inject 4 units subcutaneously twice daily - after lunch and before supper   05/06/2017 at supper  . loratadine (CLARITIN) 10 MG tablet Take 10 mg by mouth daily.    05/07/2017 at am  . Melatonin 5 MG TABS Take 5 mg by mouth at bedtime.   05/06/2017 at pm  . memantine (NAMENDA) 10 MG  tablet Take 10 mg by mouth 2 (two) times daily.  3 05/07/2017 at am  . mirabegron ER (MYRBETRIQ) 50 MG TB24 tablet Take 50 mg by mouth daily with supper.   05/06/2017 at pm  . montelukast (SINGULAIR) 10 MG tablet Take 10 mg by mouth daily.    05/07/2017 at am  .  Multiple Vitamin (MULTIVITAMIN WITH MINERALS) TABS tablet Take 1 tablet by mouth daily.   05/07/2017 at am  . nebivolol (BYSTOLIC) 5 MG tablet Take 5 mg by mouth daily.   05/07/2017 at 1100  . nitroGLYCERIN (NITROSTAT) 0.4 MG SL tablet Place 0.4 mg under the tongue every 5 (five) minutes as needed for chest pain.    10 years ago  . pantoprazole (PROTONIX) 40 MG tablet Take 40 mg by mouth 2 (two) times daily.    05/07/2017 at am  . predniSONE (DELTASONE) 5 MG tablet Take 5 mg by mouth daily with breakfast.    05/07/2017 at am  . sitaGLIPtin-metformin (JANUMET) 50-1000 MG tablet Take 1 tablet by mouth 2 (two) times daily with a meal.     05/07/2017 at am  . telmisartan (MICARDIS) 80 MG tablet Take 80 mg by mouth daily with supper.    05/06/2017 at pm  . vitamin C (ASCORBIC ACID) 500 MG tablet Take 500 mg by mouth daily with supper.    05/06/2017 at pm    Results for orders placed or performed during the hospital encounter of 05/07/17 (from the past 48 hour(s))  POC CBG, ED     Status: Abnormal   Collection Time: 05/07/17  4:49 PM  Result Value Ref Range   Glucose-Capillary 146 (H) 65 - 99 mg/dL   Comment 1 Notify RN    Comment 2 Document in Chart   CBC with Differential     Status: Abnormal   Collection Time: 05/07/17  4:52 PM  Result Value Ref Range   WBC 14.7 (H) 4.0 - 10.5 K/uL   RBC 4.32 4.22 - 5.81 MIL/uL   Hemoglobin 11.5 (L) 13.0 - 17.0 g/dL   HCT 36.5 (L) 39.0 - 52.0 %   MCV 84.5 78.0 - 100.0 fL   MCH 26.6 26.0 - 34.0 pg   MCHC 31.5 30.0 - 36.0 g/dL   RDW 14.2 11.5 - 15.5 %   Platelets 170 150 - 400 K/uL   Neutrophils Relative % 79 %   Neutro Abs 11.7 (H) 1.7 - 7.7 K/uL   Lymphocytes Relative 8 %   Lymphs Abs 1.2 0.7 - 4.0 K/uL    Monocytes Relative 6 %   Monocytes Absolute 0.8 0.1 - 1.0 K/uL   Eosinophils Relative 7 %   Eosinophils Absolute 1.0 (H) 0.0 - 0.7 K/uL   Basophils Relative 0 %   Basophils Absolute 0.0 0.0 - 0.1 K/uL  Basic metabolic panel     Status: Abnormal   Collection Time: 05/07/17  4:52 PM  Result Value Ref Range   Sodium 139 135 - 145 mmol/L   Potassium 4.6 3.5 - 5.1 mmol/L   Chloride 104 101 - 111 mmol/L   CO2 25 22 - 32 mmol/L   Glucose, Bld 152 (H) 65 - 99 mg/dL   BUN 26 (H) 6 - 20 mg/dL   Creatinine, Ser 1.39 (H) 0.61 - 1.24 mg/dL   Calcium 9.1 8.9 - 10.3 mg/dL   GFR calc non Af Amer 44 (L) >60 mL/min   GFR calc Af Amer 51 (L) >60 mL/min    Comment: (NOTE) The eGFR has been calculated using the CKD EPI equation. This calculation has not been validated in all clinical situations. eGFR's persistently <60 mL/min signify possible Chronic Kidney Disease.    Anion gap 10 5 - 15  I-Stat Troponin, ED (not at [facility])     Status: None   Collection Time: 05/07/17  5:05 PM  Result Value Ref Range   Troponin i, poc 0.00 0.00 - 0.08 ng/mL   Comment 3            Comment: Due to the release kinetics of cTnI, a negative result within the first hours of the onset of symptoms does not rule out myocardial infarction with certainty. If myocardial infarction is still suspected, repeat the test at appropriate intervals.   Magnesium     Status: None   Collection Time: 05/07/17 10:00 PM  Result Value Ref Range   Magnesium 1.8 1.7 - 2.4 mg/dL  Troponin I (q 6hr x 3)     Status: None   Collection Time: 05/07/17 10:00 PM  Result Value Ref Range   Troponin I <0.03 <0.03 ng/mL  CBG monitoring, ED     Status: Abnormal   Collection Time: 05/07/17 11:31 PM  Result Value Ref Range   Glucose-Capillary 200 (H) 65 - 99 mg/dL  Urinalysis, Routine w reflex microscopic     Status: Abnormal   Collection Time: 05/07/17 11:51 PM  Result Value Ref Range   Color, Urine YELLOW YELLOW   APPearance CLEAR CLEAR    Specific Gravity, Urine 1.018 1.005 - 1.030   pH 5.0 5.0 - 8.0   Glucose, UA 50 (A) NEGATIVE mg/dL   Hgb urine dipstick SMALL (A) NEGATIVE   Bilirubin Urine NEGATIVE NEGATIVE   Ketones, ur NEGATIVE NEGATIVE mg/dL   Protein, ur 30 (A) NEGATIVE mg/dL   Nitrite NEGATIVE NEGATIVE   Leukocytes, UA LARGE (A) NEGATIVE   RBC / HPF 6-30 0 - 5 RBC/hpf   WBC, UA TOO NUMEROUS TO COUNT 0 - 5 WBC/hpf   Bacteria, UA RARE (A) NONE SEEN   Squamous Epithelial / LPF NONE SEEN NONE SEEN   Mucus PRESENT   CBC     Status: Abnormal   Collection Time: 05/08/17  1:50 AM  Result Value Ref Range   WBC 14.4 (H) 4.0 - 10.5 K/uL   RBC 4.46 4.22 - 5.81 MIL/uL   Hemoglobin 11.7 (L) 13.0 - 17.0 g/dL   HCT 37.2 (L) 39.0 - 52.0 %   MCV 83.4 78.0 - 100.0 fL   MCH 26.2 26.0 - 34.0 pg   MCHC 31.5 30.0 - 36.0 g/dL   RDW 14.1 11.5 - 15.5 %   Platelets 183 150 - 400 K/uL  Basic metabolic panel     Status: Abnormal   Collection Time: 05/08/17  1:50 AM  Result Value Ref Range   Sodium 139 135 - 145 mmol/L   Potassium 4.1 3.5 - 5.1 mmol/L   Chloride 106 101 - 111 mmol/L   CO2 24 22 - 32 mmol/L   Glucose, Bld 95 65 - 99 mg/dL   BUN 24 (H) 6 - 20 mg/dL   Creatinine, Ser 1.19 0.61 - 1.24 mg/dL   Calcium 9.0 8.9 - 10.3 mg/dL   GFR calc non Af Amer 53 (L) >60 mL/min   GFR calc Af Amer >60 >60 mL/min    Comment: (NOTE) The eGFR has been calculated using the CKD EPI equation. This calculation has not been validated in all clinical situations. eGFR's persistently <60 mL/min signify possible Chronic Kidney Disease.    Anion gap 9 5 - 15  TSH     Status: None   Collection Time: 05/08/17  1:50 AM  Result Value Ref Range   TSH 2.021 0.350 - 4.500 uIU/mL    Comment: Performed by a 3rd Generation assay with a functional sensitivity of <=  0.01 uIU/mL.  Troponin I (q 6hr x 3)     Status: None   Collection Time: 05/08/17  1:50 AM  Result Value Ref Range   Troponin I <0.03 <0.03 ng/mL    Ct Head Wo Contrast  Result  Date: 05/07/2017 CLINICAL DATA:  Syncope. EXAM: CT HEAD WITHOUT CONTRAST TECHNIQUE: Contiguous axial images were obtained from the base of the skull through the vertex without intravenous contrast. COMPARISON:  Sep 24, 2014 FINDINGS: Brain: No subdural, epidural, or subarachnoid hemorrhage. Cerebellum, brainstem, and basal cisterns are normal. Ventricles and sulci are unchanged. Severe white matter changes are stable. No acute cortical ischemia or infarct. No mass effect or midline shift. Vascular: Calcified atherosclerosis in the intracranial carotids. Skull: Normal. Negative for fracture or focal lesion. Sinuses/Orbits: There is a mucous retention cyst in the left maxillary sinus. Mucosal thickening scattered throughout the ethmoid sinuses and both maxillary sinuses is identified. Mastoid air cells on the left are normal. Chronic changes are seen in the right mastoid bone and right middle ear, unchanged since May 2016. Visualized bones otherwise normal. Other: There is anterolisthesis of C2 versus C3 measuring 3 mm on the scout view, not seen on the December 07, 2006 CT scan. No definitive acute soft tissue swelling. The scout view is otherwise unremarkable. There is a laceration over the left side of the forehead. IMPRESSION: 1. There is anterolisthesis of C2 versus C3, measuring 3 mm, on the scout view. This was not present in August of 2008 and is age indeterminate. There is no definitive associated soft tissue swelling. If there is concern in the cervical spine, recommend dedicated cross-sectional imaging. 2. No acute intracranial abnormalities. Chronic white matter changes identified. 3. Sinus disease as above. Chronic changes in the right mastoid bone and middle ear. Electronically Signed   By: Dorise Bullion III M.D   On: 05/07/2017 18:49   Dg Chest Port 1 View  Result Date: 05/07/2017 CLINICAL DATA:  Wheezing.  Syncope today. EXAM: PORTABLE CHEST 1 VIEW COMPARISON:  02/28/2013 FINDINGS: Heart is normal in  size. There is aortic atherosclerosis. Retrocardiac hiatal hernia. No pulmonary edema, focal airspace disease or pneumothorax. No large pleural effusion. No acute osseous abnormalities. IMPRESSION: 1. No acute abnormality or explanation for wheezing. 2. Aortic atherosclerosis.  Hiatal hernia. Electronically Signed   By: Jeb Levering M.D.   On: 05/07/2017 23:01    Review of Systems  Constitutional: Negative.   HENT: Positive for hearing loss (uses hearing aid).   Eyes: Negative.   Respiratory: Positive for shortness of breath and wheezing.   Cardiovascular: Negative.   Gastrointestinal: Negative.   Genitourinary: Positive for urgency (incontinence chronic).  Musculoskeletal: Negative.   Skin: Negative.  Negative for rash (left forehead cut since fall).  Neurological: Negative.  Negative for dizziness (dementia present).  Endo/Heme/Allergies: Negative.   Psychiatric/Behavioral: Positive for memory loss.   Blood pressure (!) 134/48, pulse 64, temperature 98.4 F (36.9 C), temperature source Oral, resp. rate 17, SpO2 96 %. Physical Exam  Constitutional: He is oriented to person, place, and time. He appears well-developed and well-nourished. No distress.  HENT:  Has sutures on left forehead above eyebrow and puffiness and ecchymosis of left eyelid  Cardiovascular: Normal rate and regular rhythm. Exam reveals no gallop.  Murmur (II/VI SEM in aortic area) heard. Occasional ectopy present  Respiratory: Effort normal. He has wheezes (bilateral and diffuse).  GI: Soft. Bowel sounds are normal.  Musculoskeletal: Normal range of motion.  Neurological: He is alert and oriented  to person, place, and time.  Skin: Skin is warm and dry.  Psychiatric: He has a normal mood and affect.   EKG 05/07/2016: Normal sinus rhythm with frequent PVCs.  Telemetry: Normal sinus rhythm, frequent PVCs, one episode of less than 2 Seconds ventricular standstill, Asymptomatic.  Echocardiogram 05/08/2017: Normal  LV systolic function mild LVH, grade 1 diastolic dysfunction, elevated LVEDP.  Mild aortic valve calcification and mitral annular calcification.  Assessment/Plan: 1. Syncope most probably vasovagal, but cannot exclude sick sinus and AV conduction disease 2. CAD   S/P RCA stent in 2005, no f/u over years. No angina or CHF on exam. 3. DM-controlled without hyperglycemia 4. Hypertension 5. Frequent PVC on tele. No significant heart block on tele overnight. 1.6 second pause asymptomatic  Rec: Patient's presentation is probably related to vasovagal episode of October as he has had 2 episodes, 2nd episode occurring while he was laying on the floor, conduction system disease with 86 sinus syndrome or AV nodal disease need to be considered.  He does have frequent PVCs on telemetry, cardiac dysrhythmias need to be excluded.  I'll set him up for a Lexiscan sestamibi stress test in the morning.  Echocardiogram was reviewed.  Plan on keeping him in the hospital for 24-48 hours for those monitoring of arrhythmias.  Will consider outpatient 30 day event a if nothing specific was found from cardiac standpoint.  Adrian Prows 05/08/2017, 8:46 AM

## 2017-05-08 NOTE — Progress Notes (Signed)
PROGRESS NOTE    [name]  XFG:[id number] DOB: 1929/02/20 DOA: 05/07/2017 PCP: Leanna Battles, MD   Outpatient Specialists:     Brief Narrative:  [name] is a 84 y.o. male with medical history significant of HTN, HLD, DM type II, CAD s/p DES to RCA, dCHF (EF 65-70% w/ grade 1DD in 01/2013), COPD, OSA, dementia, and BPH; who presents after having a fall while getting out of the shower.  History is obtained mostly from his family as he has dementia with poor short-term memory. Patient was found on the floor between the shower and toilet facedown by his wife who heard him fall and noted to have been out for 2-3 minutes.  There was no note that the patient ever stopped breathing and unclear if his wife checked for a pulse at that time.  He sustained a cut above his left eye and the bridge of his nose.  Family suspects that he sustained cuts from the edge of the shower.  His wife reported that the patient regained consciousness briefly before losing consciousness again for 2-3 minutes prior to EMS arrival.  Family states the patient did complain of some mild right-sided shoulder pain.  Otherwise he chronically has wheezing per patient's wife.  Patient has been in otherwise in his normal state of health, and son notes that that have difficulty with getting him to drink enough fluids.  Family notes that he normally ambulates without need of assistance of the p.m. that his gait appears to be unsteady at times.  Dr. Einar Gip of  Cardiology consulted to evaluate the patient due to the trigeminy seen as they note no previous history of arrhythmias.  Dr.Cardama  sutured the lacerations above the patient's left eye and bridge of nose.    Assessment & Plan:   Principal Problem:   Fall Active Problems:   OSA (obstructive sleep apnea)   COPD (chronic obstructive pulmonary disease) (HCC)   Coronary artery disease   Dementia   Alzheimer's dementia without behavioral disturbance   Fall 2/2  possible syncopal episode vs mechancial:  -EKG showing signs of bigeminy/trigeminy   - Trend troponins - Check echocardiogram - cardiology consult  Leukocytosis: WBC elevated at 14.7 -? Reactive-- labs in AM  Urinary tract infection: Acute.  Urinalysis came back positive for signs of infection.  As a possible cause for elevated - Rocephin given in ER but no culture sent before abx -change to PO abx  Laceration to the left eye: Patient received sutures in the ED no acute intracranial abnormality noted.   CAD: s/p DES to the RCA in 2005 previously followed by Dr. Cathie Olden - Continue Plavix and aspirin  Possible dehydration: Patient noted to have a increase in his BUN slightly increased. -improved with hydration  Diabetes mellitus type 2: Last available hemoglobin A1c noted to be 8.3 on 02/23/2016 - Hold Amaryl and Janumet - Continue home insulin regimen of 4 units after lunch and dinner and SSI  Essential hypertension - Continue nebivolol, amlodipine, and pharmacy substitution of irbesartan for Micardis -BP all over the place--- ask nursing to check manually  COPD, without acute exacerbation: Patient on chronic prednisone at 5 mg daily - Continue prednisone, fluticasone-umeclidin, Spiriva -home o2 study  Dementia: Patient with very poor short-term memory - Continue Aricept and Namenda  Chronic kidney disease stage III: Creatinine 1.39 on admission which appears similar to previous check in 2017.  BPH - continue finasteride and tamsulosin  OSA on CPAP - RT to supply  CPAP  GERD -Continue Protonix    Code Status: Full Code   Family Communication: At bedside  Disposition Plan:  1-2 days home PT/OT eval pending  Consultants:   Cards- [place]    Subjective: Says he fell when he was coming around the corner  Objective: Vitals:   05/08/17 0800 05/08/17 0933 05/08/17 1106 05/08/17 1114  BP: (!) 134/48 (!) 147/77  (!) 168/56  Pulse: 64 68 76 69    Resp: 17 13 18 19   Temp:  98.1 F (36.7 C)  98.7 F (37.1 C)  TempSrc:  Oral  Oral  SpO2: 96% 99% 95% 96%    Intake/Output Summary (Last 24 hours) at 05/08/2017 1622 Last data filed at 05/08/2017 1100 Gross per 24 hour  Intake 236.25 ml  Output -  Net 236.25 ml   There were no vitals filed for this visit.  Examination:  General exam: left eye with darkening and sutured wound above Respiratory system: diminished, no wheezing heard Cardiovascular system: rrr-- PVC Gastrointestinal system: +BS, soft Central nervous system: alert- pleasant/cooperative Extremities: moves all 4 ext Skin: few bruises- left eye and right hand     Data Reviewed: I have personally reviewed following labs and imaging studies  CBC: Recent Labs  Lab 05/07/17 1652 05/08/17 0150  WBC 14.7* 14.4*  NEUTROABS 11.7*  --   HGB 11.5* 11.7*  HCT 36.5* 37.2*  MCV 84.5 83.4  PLT 170 465   Basic Metabolic Panel: Recent Labs  Lab 05/07/17 1652 05/07/17 2200 05/08/17 0150  NA 139  --  139  K 4.6  --  4.1  CL 104  --  106  CO2 25  --  24  GLUCOSE 152*  --  95  BUN 26*  --  24*  CREATININE 1.39*  --  1.19  CALCIUM 9.1  --  9.0  MG  --  1.8  --    GFR: CrCl cannot be calculated (Unknown ideal weight.). Liver Function Tests: No results for input(s): AST, ALT, ALKPHOS, BILITOT, PROT, ALBUMIN in the last 168 hours. No results for input(s): LIPASE, AMYLASE in the last 168 hours. No results for input(s): AMMONIA in the last 168 hours. Coagulation Profile: No results for input(s): INR, PROTIME in the last 168 hours. Cardiac Enzymes: Recent Labs  Lab 05/07/17 2200 05/08/17 0150  TROPONINI <0.03 <0.03   BNP (last 3 results) No results for input(s): PROBNP in the last 8760 hours. HbA1C: No results for input(s): HGBA1C in the last 72 hours. CBG: Recent Labs  Lab 05/07/17 1649 05/07/17 2331 05/08/17 1103  GLUCAP 146* 200* 153*   Lipid Profile: No results for input(s): CHOL, HDL, LDLCALC,  TRIG, CHOLHDL, LDLDIRECT in the last 72 hours. Thyroid Function Tests: Recent Labs    05/08/17 0150  TSH 2.021   Anemia Panel: No results for input(s): VITAMINB12, FOLATE, FERRITIN, TIBC, IRON, RETICCTPCT in the last 72 hours. Urine analysis:    Component Value Date/Time   COLORURINE YELLOW 05/07/2017 2351   APPEARANCEUR CLEAR 05/07/2017 2351   LABSPEC 1.018 05/07/2017 2351   PHURINE 5.0 05/07/2017 2351   GLUCOSEU 50 (A) 05/07/2017 2351   HGBUR SMALL (A) 05/07/2017 2351   BILIRUBINUR NEGATIVE 05/07/2017 2351   KETONESUR NEGATIVE 05/07/2017 2351   PROTEINUR 30 (A) 05/07/2017 2351   UROBILINOGEN 0.2 09/24/2014 1442   NITRITE NEGATIVE 05/07/2017 2351   LEUKOCYTESUR LARGE (A) 05/07/2017 2351     )No results found for this or any previous visit (from the past 240 hour(s)).  Anti-infectives (From admission, onward)   Start     Dose/Rate Route Frequency Ordered Stop   05/09/17 0800  cefTRIAXone (ROCEPHIN) 1 g in dextrose 5 % 50 mL IVPB  Status:  Discontinued     1 g 100 mL/hr over 30 Minutes Intravenous Every 24 hours 05/08/17 1519 05/08/17 1620   05/08/17 1700  cefUROXime (CEFTIN) tablet 500 mg     500 mg Oral 2 times daily with meals 05/08/17 1621     05/08/17 0715  cefTRIAXone (ROCEPHIN) 2 g in dextrose 5 % 50 mL IVPB     2 g 100 mL/hr over 30 Minutes Intravenous  Once 05/08/17 9476 05/08/17 5465       Radiology Studies: Ct Head Wo Contrast  Result Date: 05/07/2017 CLINICAL DATA:  Syncope. EXAM: CT HEAD WITHOUT CONTRAST TECHNIQUE: Contiguous axial images were obtained from the base of the skull through the vertex without intravenous contrast. COMPARISON:  Sep 24, 2014 FINDINGS: Brain: No subdural, epidural, or subarachnoid hemorrhage. Cerebellum, brainstem, and basal cisterns are normal. Ventricles and sulci are unchanged. Severe white matter changes are stable. No acute cortical ischemia or infarct. No mass effect or midline shift. Vascular: Calcified atherosclerosis in  the intracranial carotids. Skull: Normal. Negative for fracture or focal lesion. Sinuses/Orbits: There is a mucous retention cyst in the left maxillary sinus. Mucosal thickening scattered throughout the ethmoid sinuses and both maxillary sinuses is identified. Mastoid air cells on the left are normal. Chronic changes are seen in the right mastoid bone and right middle ear, unchanged since May 2016. Visualized bones otherwise normal. Other: There is anterolisthesis of C2 versus C3 measuring 3 mm on the scout view, not seen on the December 07, 2006 CT scan. No definitive acute soft tissue swelling. The scout view is otherwise unremarkable. There is a laceration over the left side of the forehead. IMPRESSION: 1. There is anterolisthesis of C2 versus C3, measuring 3 mm, on the scout view. This was not present in August of 2008 and is age indeterminate. There is no definitive associated soft tissue swelling. If there is concern in the cervical spine, recommend dedicated cross-sectional imaging. 2. No acute intracranial abnormalities. Chronic white matter changes identified. 3. Sinus disease as above. Chronic changes in the right mastoid bone and middle ear. Electronically Signed   By: Dorise Bullion III M.D   On: 05/07/2017 18:49   Dg Chest Port 1 View  Result Date: 05/07/2017 CLINICAL DATA:  Wheezing.  Syncope today. EXAM: PORTABLE CHEST 1 VIEW COMPARISON:  02/28/2013 FINDINGS: Heart is normal in size. There is aortic atherosclerosis. Retrocardiac hiatal hernia. No pulmonary edema, focal airspace disease or pneumothorax. No large pleural effusion. No acute osseous abnormalities. IMPRESSION: 1. No acute abnormality or explanation for wheezing. 2. Aortic atherosclerosis.  Hiatal hernia. Electronically Signed   By: Jeb Levering M.D.   On: 05/07/2017 23:01        Scheduled Meds: . albuterol  2.5 mg Nebulization BID  . amLODipine  5 mg Oral Daily  . aspirin EC  81 mg Oral Q supper  . atorvastatin  20 mg Oral  Daily  . cefUROXime  500 mg Oral BID WC  . clopidogrel  75 mg Oral Daily  . donepezil  10 mg Oral Q supper  . enoxaparin (LOVENOX) injection  40 mg Subcutaneous Q24H  . escitalopram  5 mg Oral Q supper  . finasteride  5 mg Oral Q supper  . fluticasone furoate-vilanterol  1 puff Inhalation Daily   And  .  umeclidinium bromide  1 puff Inhalation Daily  . insulin aspart  0-5 Units Subcutaneous QHS  . insulin aspart  0-9 Units Subcutaneous TID WC  . insulin aspart  4 Units Subcutaneous BID AC  . irbesartan  300 mg Oral Daily  . memantine  10 mg Oral BID  . mirabegron ER  50 mg Oral Q supper  . montelukast  10 mg Oral Daily  . nebivolol  5 mg Oral Daily  . pantoprazole  40 mg Oral BID  . predniSONE  5 mg Oral Q breakfast   Continuous Infusions:   LOS: 0 days    Time spent: 35 min    Geradine Girt, DO [facility] Pager [phone]  If 7PM-7AM, please contact night-coverage [email or web address] Password [facility] 05/08/2017, 4:22 PM

## 2017-05-08 NOTE — Progress Notes (Signed)
  Echocardiogram 2D Echocardiogram has been performed.  Merrie Roof F 05/08/2017, 2:40 PM

## 2017-05-09 ENCOUNTER — Encounter (HOSPITAL_COMMUNITY): Payer: Self-pay

## 2017-05-09 ENCOUNTER — Other Ambulatory Visit: Payer: Self-pay

## 2017-05-09 ENCOUNTER — Observation Stay (HOSPITAL_COMMUNITY): Payer: Medicare Other

## 2017-05-09 DIAGNOSIS — F0390 Unspecified dementia without behavioral disturbance: Secondary | ICD-10-CM | POA: Diagnosis not present

## 2017-05-09 DIAGNOSIS — I493 Ventricular premature depolarization: Secondary | ICD-10-CM | POA: Diagnosis present

## 2017-05-09 DIAGNOSIS — R008 Other abnormalities of heart beat: Secondary | ICD-10-CM | POA: Diagnosis present

## 2017-05-09 DIAGNOSIS — J302 Other seasonal allergic rhinitis: Secondary | ICD-10-CM | POA: Diagnosis present

## 2017-05-09 DIAGNOSIS — N183 Chronic kidney disease, stage 3 (moderate): Secondary | ICD-10-CM | POA: Diagnosis present

## 2017-05-09 DIAGNOSIS — N3281 Overactive bladder: Secondary | ICD-10-CM | POA: Diagnosis present

## 2017-05-09 DIAGNOSIS — H903 Sensorineural hearing loss, bilateral: Secondary | ICD-10-CM | POA: Diagnosis present

## 2017-05-09 DIAGNOSIS — F0280 Dementia in other diseases classified elsewhere without behavioral disturbance: Secondary | ICD-10-CM | POA: Diagnosis present

## 2017-05-09 DIAGNOSIS — G4733 Obstructive sleep apnea (adult) (pediatric): Secondary | ICD-10-CM | POA: Diagnosis present

## 2017-05-09 DIAGNOSIS — N400 Enlarged prostate without lower urinary tract symptoms: Secondary | ICD-10-CM | POA: Diagnosis present

## 2017-05-09 DIAGNOSIS — E273 Drug-induced adrenocortical insufficiency: Secondary | ICD-10-CM | POA: Diagnosis present

## 2017-05-09 DIAGNOSIS — G309 Alzheimer's disease, unspecified: Secondary | ICD-10-CM | POA: Diagnosis present

## 2017-05-09 DIAGNOSIS — I2510 Atherosclerotic heart disease of native coronary artery without angina pectoris: Secondary | ICD-10-CM | POA: Diagnosis present

## 2017-05-09 DIAGNOSIS — R55 Syncope and collapse: Secondary | ICD-10-CM | POA: Diagnosis present

## 2017-05-09 DIAGNOSIS — W182XXA Fall in (into) shower or empty bathtub, initial encounter: Secondary | ICD-10-CM | POA: Diagnosis present

## 2017-05-09 DIAGNOSIS — S0121XA Laceration without foreign body of nose, initial encounter: Secondary | ICD-10-CM | POA: Diagnosis present

## 2017-05-09 DIAGNOSIS — K219 Gastro-esophageal reflux disease without esophagitis: Secondary | ICD-10-CM | POA: Diagnosis present

## 2017-05-09 DIAGNOSIS — E1122 Type 2 diabetes mellitus with diabetic chronic kidney disease: Secondary | ICD-10-CM | POA: Diagnosis present

## 2017-05-09 DIAGNOSIS — N390 Urinary tract infection, site not specified: Secondary | ICD-10-CM | POA: Diagnosis present

## 2017-05-09 DIAGNOSIS — E785 Hyperlipidemia, unspecified: Secondary | ICD-10-CM | POA: Diagnosis present

## 2017-05-09 DIAGNOSIS — K449 Diaphragmatic hernia without obstruction or gangrene: Secondary | ICD-10-CM | POA: Diagnosis present

## 2017-05-09 DIAGNOSIS — I129 Hypertensive chronic kidney disease with stage 1 through stage 4 chronic kidney disease, or unspecified chronic kidney disease: Secondary | ICD-10-CM | POA: Diagnosis present

## 2017-05-09 DIAGNOSIS — J449 Chronic obstructive pulmonary disease, unspecified: Secondary | ICD-10-CM | POA: Diagnosis present

## 2017-05-09 DIAGNOSIS — S0181XA Laceration without foreign body of other part of head, initial encounter: Secondary | ICD-10-CM | POA: Diagnosis present

## 2017-05-09 DIAGNOSIS — Z7902 Long term (current) use of antithrombotics/antiplatelets: Secondary | ICD-10-CM | POA: Diagnosis not present

## 2017-05-09 DIAGNOSIS — Z8673 Personal history of transient ischemic attack (TIA), and cerebral infarction without residual deficits: Secondary | ICD-10-CM | POA: Diagnosis not present

## 2017-05-09 LAB — BASIC METABOLIC PANEL
Anion gap: 15 (ref 5–15)
BUN: 22 mg/dL — AB (ref 6–20)
CHLORIDE: 104 mmol/L (ref 101–111)
CO2: 23 mmol/L (ref 22–32)
CREATININE: 1.17 mg/dL (ref 0.61–1.24)
Calcium: 8.9 mg/dL (ref 8.9–10.3)
GFR calc Af Amer: >60 mL/min (ref >60)
GFR calc non Af Amer: 55 mL/min — ABNORMAL LOW (ref >60)
Glucose, Bld: 124 mg/dL — ABNORMAL HIGH (ref 65–99)
Potassium: 4 mmol/L (ref 3.5–5.1)
SODIUM: 142 mmol/L (ref 135–145)

## 2017-05-09 LAB — GLUCOSE, CAPILLARY
GLUCOSE-CAPILLARY: 131 mg/dL — AB (ref 65–99)
GLUCOSE-CAPILLARY: 204 mg/dL — AB (ref 65–99)
Glucose-Capillary: 202 mg/dL — ABNORMAL HIGH (ref 65–99)
Glucose-Capillary: 329 mg/dL — ABNORMAL HIGH (ref 65–99)

## 2017-05-09 LAB — CBC
HEMATOCRIT: 36.4 % — AB (ref 39.0–52.0)
HEMOGLOBIN: 11.8 g/dL — AB (ref 13.0–17.0)
MCH: 27.3 pg (ref 26.0–34.0)
MCHC: 32.4 g/dL (ref 30.0–36.0)
MCV: 84.3 fL (ref 78.0–100.0)
Platelets: 174 10*3/uL (ref 150–400)
RBC: 4.32 MIL/uL (ref 4.22–5.81)
RDW: 14.4 % (ref 11.5–15.5)
WBC: 11.2 10*3/uL — ABNORMAL HIGH (ref 4.0–10.5)

## 2017-05-09 MED ORDER — TECHNETIUM TC 99M TETROFOSMIN IV KIT: 30.0000 | PACK | Freq: Once | INTRAVENOUS | Status: DC | PRN

## 2017-05-09 MED ORDER — REGADENOSON 0.4 MG/5ML IV SOLN
INTRAVENOUS | Status: AC
  Filled 2017-05-09: qty 5

## 2017-05-09 MED ORDER — TECHNETIUM TC 99M TETROFOSMIN IV KIT
10.0000 | PACK | Freq: Once | INTRAVENOUS | Status: AC | PRN
  Administered 2017-05-09: 10 via INTRAVENOUS

## 2017-05-09 MED ORDER — REGADENOSON 0.4 MG/5ML IV SOLN
0.4000 mg | Freq: Once | INTRAVENOUS | Status: DC
  Filled 2017-05-09: qty 5

## 2017-05-09 NOTE — Care Management Obs Status (Signed)
[place] NOTIFICATION   Patient Details  Name: [name] MRN: [id number] Date of Birth: 1928-08-31   Medicare Observation Status Notification Given:  Yes    Carles Collet, RN 05/09/2017, 11:04 AM

## 2017-05-09 NOTE — Progress Notes (Signed)
OT Cancellation    05/09/17 1000  OT Visit Information  Last OT Received On 05/09/17  Reason Eval/Treat Not Completed Patient at procedure or test/ unavailable (Off the floor for testing. Will return as schedule allows.)   Jeff Mccallum MSOT, OTR/L Acute Rehab Pager: [phone] Office: [phone]

## 2017-05-09 NOTE — Progress Notes (Signed)
   05/09/17 1300  Clinical Encounter Type  Visited With Patient and family together  Visit Type Follow-up  Referral From Chaplain  Consult/Referral To Chaplain  Spiritual Encounters  Spiritual Needs Emotional  Stress Factors  Patient Stress Factors Health changes  Chaplain visited with PT and wife.  PT was in the middle of being fed and eating, Chaplain provided an introduction to the PT and made him aware that Chaplain was part of his care team.  The PT was alert and oriented and had no need for Chaplain services at this moment.

## 2017-05-09 NOTE — Evaluation (Addendum)
Physical Therapy Evaluation Patient Details Name: [name] MRN: [id number] DOB: 11-06-1928 Today's Date: 05/09/2017   History of Present Illness  84 y.o. male with medical history significant of HTN, HLD, DM type II, CAD, CHF, COPD, OSA, dementia, and BPH. Admitted with fall at home getting out of shower. Pt with UTI and laceration to above left eye  Clinical Impression  Pt pleasant and joking throughout session with decreased orientation to time, decreased problem solving and safety awareness. Pt with impaired balance, gait and strength who will benefit from acute therapy to maximize independence and safety to decrease fall risk. All recommendations written for wife as she states she may forget. Pt encouraged to use rail for stairs, RW at all times, sit for bathing with supervision for shower transfers. Pt with limited mobility at home and at least 2 falls in the last year with unsteady gait.     Follow Up Recommendations Home health PT    Equipment Recommendations  Rolling walker with 5" wheels;3in1 (PT)    Recommendations for Other Services OT consult     Precautions / Restrictions Precautions Precautions: Fall      Mobility  Bed Mobility Overal bed mobility: Modified Independent                Transfers Overall transfer level: Modified independent                  Ambulation/Gait Ambulation/Gait assistance: Min assist Ambulation Distance (Feet): 500 Feet Assistive device: Rolling walker (2 wheeled);None Gait Pattern/deviations: Step-through pattern;Drifts right/left   Gait velocity interpretation: at or above normal speed for age/gender General Gait Details: pt walked 400' without AD with unsteady gait and min assist to maintain balance as pt drifting right/left with min assist to maintain standing. Use of rW for 100' with minguard assist with cues for direction and safety as pt pulling RW off ground with 1/2 turns. Cues for position in RW and  safety  Stairs            Wheelchair Mobility    Modified Rankin (Stroke Patients Only)       Balance Overall balance assessment: Needs assistance;History of Falls   Sitting balance-Leahy Scale: Good       Standing balance-Leahy Scale: Fair                               Pertinent Vitals/Pain Pain Assessment: No/denies pain    Home Living Family/patient expects to be discharged to:: Private residence Living Arrangements: Spouse/significant other Available Help at Discharge: Family;Available 24 hours/day Type of Home: House Home Access: Stairs to enter Entrance Stairs-Rails: Right Entrance Stairs-Number of Steps: 3 Home Layout: Two level;Able to live on main level with bedroom/bathroom Home Equipment: Gilford Rile - 2 wheels Additional Comments: wife unsure if RW is still at home    Prior Function Level of Independence: Independent         Comments: assist for medication, does not drive, wife does the homemaking     Hand Dominance        Extremity/Trunk Assessment   Upper Extremity Assessment Upper Extremity Assessment: Overall WFL for tasks assessed    Lower Extremity Assessment Lower Extremity Assessment: Overall WFL for tasks assessed    Cervical / Trunk Assessment Cervical / Trunk Assessment: Kyphotic  Communication   Communication: No difficulties  Cognition Arousal/Alertness: Awake/alert Behavior During Therapy: WFL for tasks assessed/performed Overall Cognitive Status: History of cognitive impairments -  at baseline                                 General Comments: not oriented to time which is baseline. Pt stating he drives which he does not      General Comments      Exercises     Assessment/Plan    PT Assessment Patient needs continued PT services  PT Problem List Decreased mobility;Decreased strength;Decreased safety awareness;Decreased activity tolerance;Decreased balance;Decreased knowledge of use of  DME;Decreased cognition       PT Treatment Interventions Gait training;Therapeutic exercise;Patient/family education;Stair training;Balance training;Functional mobility training;DME instruction;Therapeutic activities;Neuromuscular re-education    PT Goals (Current goals can be found in the Care Plan section)  Acute Rehab PT Goals Patient Stated Goal: return home PT Goal Formulation: With patient/family Time For Goal Achievement: 05/16/17 Potential to Achieve Goals: Good    Frequency Min 3X/week   Barriers to discharge        Co-evaluation               AM-PAC PT "6 Clicks" Daily Activity  Outcome Measure Difficulty turning over in bed (including adjusting bedclothes, sheets and blankets)?: None Difficulty moving from lying on back to sitting on the side of the bed? : None Difficulty sitting down on and standing up from a chair with arms (e.g., wheelchair, bedside commode, etc,.)?: A Little Help needed moving to and from a bed to chair (including a wheelchair)?: A Little Help needed walking in hospital room?: A Little Help needed climbing 3-5 steps with a railing? : A Little 6 Click Score: 20    End of Session Equipment Utilized During Treatment: Gait belt Activity Tolerance: Patient tolerated treatment well Patient left: in chair;with call bell/phone within reach;with chair alarm set;with family/visitor present Nurse Communication: Mobility status PT Visit Diagnosis: Other abnormalities of gait and mobility (R26.89);History of falling (Z91.81)    Time: 3568-6168 PT Time Calculation (min) (ACUTE ONLY): 26 min   Charges:   PT Evaluation $PT Eval Moderate Complexity: 1 Mod PT Treatments $Gait Training: 8-22 mins   PT G Codes:        Elwyn Reach, PT [phone]   [place] 05/09/2017, 8:11 AM

## 2017-05-09 NOTE — Progress Notes (Signed)
PROGRESS NOTE    [name]  QIW:[id number] DOB: [birth year] DOA: 05/07/2017 PCP: Leanna Battles, MD   Outpatient Specialists:     Brief Narrative:  [name] is a 84 y.o. male with medical history significant of HTN, HLD, DM type II, CAD s/p DES to RCA, dCHF (EF 65-70% w/ grade 1DD in 01/2013), COPD, OSA, dementia, and BPH; who presents after having a fall while getting out of the shower.  History is obtained mostly from his family as he has dementia with poor short-term memory. Patient was found on the floor between the shower and toilet facedown by his wife who heard him fall and noted to have been out for 2-3 minutes.  There was no note that the patient ever stopped breathing and unclear if his wife checked for a pulse at that time.  He sustained a cut above his left eye and the bridge of his nose.  Family suspects that he sustained cuts from the edge of the shower.  His wife reported that the patient regained consciousness briefly before losing consciousness again for 2-3 minutes prior to EMS arrival.  Family states the patient did complain of some mild right-sided shoulder pain.  Otherwise he chronically has wheezing per patient's wife.  Patient has been in otherwise in his normal state of health, and son notes that that have difficulty with getting him to drink enough fluids.  Family notes that he normally ambulates without need of assistance of the p.m. that his gait appears to be unsteady at times.  Dr. Einar Gip of  Cardiology consulted to evaluate the patient due to the trigeminy seen as they note no previous history of arrhythmias.  Dr.Cardama  sutured the lacerations above the patient's left eye and bridge of nose.    Assessment & Plan:   Principal Problem:   Fall Active Problems:   OSA (obstructive sleep apnea)   COPD (chronic obstructive pulmonary disease) (HCC)   Coronary artery disease   Dementia   Alzheimer's dementia without behavioral disturbance   Fall 2/2  possible syncopal episode vs mechancial:  -tele showing signs of bigeminy/trigeminy   - CE negative - Check echocardiogram - cardiology consult appreciated-- spoke with Dr. Einar Gip-- plan for 24 more hours of tele Stress test results pending  Leukocytosis: WBC elevated at 14.7 -? Reactive-- trending down  Urinary tract infection: Acute.  Urinalysis came back positive for signs of infection.  As a possible cause for elevated - Rocephin given in ER but no culture sent before abx -change to PO abx x 7 days  Laceration to the left eye: Patient received sutures in the ED no acute intracranial abnormality noted.   CAD: s/p DES to the RCA in 2005 previously followed by Dr. Cathie Olden - Continue Plavix and aspirin  Possible dehydration: Patient noted to have a increase in his BUN slightly increased. -improved with hydration  Diabetes mellitus type 2: Last available hemoglobin A1c noted to be 8.3 on 02/23/2016 - Hold Amaryl and Janumet - Continue home insulin regimen of 4 units after lunch and dinner and SSI  Essential hypertension - Continue nebivolol, amlodipine, and pharmacy substitution of irbesartan for Micardis -BP all over the place again from 417 sytolically to 408X--- ask nursing to check manually -defer change in meds if needed to cardiology  COPD, without acute exacerbation: Patient on chronic prednisone at 5 mg daily - Continue prednisone, fluticasone-umeclidin, Spiriva -home o2 study--- ? If hypoxic with long ambulations  Dementia: Patient with very poor short-term memory -  Continue Aricept and Namenda -was due to follow up with Dr. Jaynee Eagles (neurology) -most important issue as patient does not remember that he is unsteady and needs to use the walker  Chronic kidney disease stage III: Creatinine 1.39 on admission which appears similar to previous check in 2017.  BPH - continue finasteride and tamsulosin  OSA on CPAP - RT to supply CPAP  GERD -Continue  Protonix    Code Status: Full Code   Family Communication: At bedside-- wife  Disposition Plan:  Home in AM   Consultants:   Cards- [place]    Subjective: Says he was very steady when he walked and did not need to use a walker  Objective: Vitals:   05/09/17 0721 05/09/17 0739 05/09/17 1004 05/09/17 1200  BP:  (!) 157/70 (!) 192/76 119/81  Pulse:  67 88 (!) 45  Resp:  18  17  Temp:  98 F (36.7 C)  98.1 F (36.7 C)  TempSrc:  Oral  Oral  SpO2: 100% 98%  98%  Weight:      Height:       No intake or output data in the 24 hours ending 05/09/17 1535 Filed Weights   05/09/17 0344  Weight: 74.8 kg (164 lb 14.5 oz)    Examination:  General exam: bruising around eye with sutured area on forehead Respiratory system: crackles Cardiovascular system: rrr Gastrointestinal system: +BS, soft Central nervous system: alert but pleasantly confused Extremities:moves all 4 ext      Data Reviewed: I have personally reviewed following labs and imaging studies  CBC: Recent Labs  Lab 05/07/17 1652 05/08/17 0150 05/09/17 0534  WBC 14.7* 14.4* 11.2*  NEUTROABS 11.7*  --   --   HGB 11.5* 11.7* 11.8*  HCT 36.5* 37.2* 36.4*  MCV 84.5 83.4 84.3  PLT 170 183 161   Basic Metabolic Panel: Recent Labs  Lab 05/07/17 1652 05/07/17 2200 05/08/17 0150 05/09/17 0534  NA 139  --  139 142  K 4.6  --  4.1 4.0  CL 104  --  106 104  CO2 25  --  24 23  GLUCOSE 152*  --  95 124*  BUN 26*  --  24* 22*  CREATININE 1.39*  --  1.19 1.17  CALCIUM 9.1  --  9.0 8.9  MG  --  1.8  --   --    GFR: Estimated Creatinine Clearance: 42.4 mL/min (by C-G formula based on SCr of 1.17 mg/dL). Liver Function Tests: No results for input(s): AST, ALT, ALKPHOS, BILITOT, PROT, ALBUMIN in the last 168 hours. No results for input(s): LIPASE, AMYLASE in the last 168 hours. No results for input(s): AMMONIA in the last 168 hours. Coagulation Profile: No results for input(s): INR, PROTIME in the  last 168 hours. Cardiac Enzymes: Recent Labs  Lab 05/07/17 2200 05/08/17 0150  TROPONINI <0.03 <0.03   BNP (last 3 results) No results for input(s): PROBNP in the last 8760 hours. HbA1C: No results for input(s): HGBA1C in the last 72 hours. CBG: Recent Labs  Lab 05/08/17 1103 05/08/17 1609 05/08/17 2203 05/09/17 0738 05/09/17 1159  GLUCAP 153* 150* 202* 131* 202*   Lipid Profile: No results for input(s): CHOL, HDL, LDLCALC, TRIG, CHOLHDL, LDLDIRECT in the last 72 hours. Thyroid Function Tests: Recent Labs    05/08/17 0150  TSH 2.021   Anemia Panel: No results for input(s): VITAMINB12, FOLATE, FERRITIN, TIBC, IRON, RETICCTPCT in the last 72 hours. Urine analysis:    Component Value Date/Time  COLORURINE YELLOW 05/07/2017 2351   APPEARANCEUR CLEAR 05/07/2017 2351   LABSPEC 1.018 05/07/2017 2351   PHURINE 5.0 05/07/2017 2351   GLUCOSEU 50 (A) 05/07/2017 2351   HGBUR SMALL (A) 05/07/2017 2351   BILIRUBINUR NEGATIVE 05/07/2017 2351   KETONESUR NEGATIVE 05/07/2017 2351   PROTEINUR 30 (A) 05/07/2017 2351   UROBILINOGEN 0.2 09/24/2014 1442   NITRITE NEGATIVE 05/07/2017 2351   LEUKOCYTESUR LARGE (A) 05/07/2017 2351     )No results found for this or any previous visit (from the past 240 hour(s)).    Anti-infectives (From admission, onward)   Start     Dose/Rate Route Frequency Ordered Stop   05/09/17 0800  cefTRIAXone (ROCEPHIN) 1 g in dextrose 5 % 50 mL IVPB  Status:  Discontinued     1 g 100 mL/hr over 30 Minutes Intravenous Every 24 hours 05/08/17 1519 05/08/17 1620   05/08/17 1700  cefUROXime (CEFTIN) tablet 500 mg     500 mg Oral 2 times daily with meals 05/08/17 1621     05/08/17 0715  cefTRIAXone (ROCEPHIN) 2 g in dextrose 5 % 50 mL IVPB     2 g 100 mL/hr over 30 Minutes Intravenous  Once 05/08/17 6712 05/08/17 4580       Radiology Studies: Ct Head Wo Contrast  Result Date: 05/07/2017 CLINICAL DATA:  Syncope. EXAM: CT HEAD WITHOUT CONTRAST TECHNIQUE:  Contiguous axial images were obtained from the base of the skull through the vertex without intravenous contrast. COMPARISON:  Sep 24, 2014 FINDINGS: Brain: No subdural, epidural, or subarachnoid hemorrhage. Cerebellum, brainstem, and basal cisterns are normal. Ventricles and sulci are unchanged. Severe white matter changes are stable. No acute cortical ischemia or infarct. No mass effect or midline shift. Vascular: Calcified atherosclerosis in the intracranial carotids. Skull: Normal. Negative for fracture or focal lesion. Sinuses/Orbits: There is a mucous retention cyst in the left maxillary sinus. Mucosal thickening scattered throughout the ethmoid sinuses and both maxillary sinuses is identified. Mastoid air cells on the left are normal. Chronic changes are seen in the right mastoid bone and right middle ear, unchanged since May 2016. Visualized bones otherwise normal. Other: There is anterolisthesis of C2 versus C3 measuring 3 mm on the scout view, not seen on the December 07, 2006 CT scan. No definitive acute soft tissue swelling. The scout view is otherwise unremarkable. There is a laceration over the left side of the forehead. IMPRESSION: 1. There is anterolisthesis of C2 versus C3, measuring 3 mm, on the scout view. This was not present in August of 2008 and is age indeterminate. There is no definitive associated soft tissue swelling. If there is concern in the cervical spine, recommend dedicated cross-sectional imaging. 2. No acute intracranial abnormalities. Chronic white matter changes identified. 3. Sinus disease as above. Chronic changes in the right mastoid bone and middle ear. Electronically Signed   By: Dorise Bullion III M.D   On: 05/07/2017 18:49   Dg Chest Port 1 View  Result Date: 05/07/2017 CLINICAL DATA:  Wheezing.  Syncope today. EXAM: PORTABLE CHEST 1 VIEW COMPARISON:  02/28/2013 FINDINGS: Heart is normal in size. There is aortic atherosclerosis. Retrocardiac hiatal hernia. No pulmonary  edema, focal airspace disease or pneumothorax. No large pleural effusion. No acute osseous abnormalities. IMPRESSION: 1. No acute abnormality or explanation for wheezing. 2. Aortic atherosclerosis.  Hiatal hernia. Electronically Signed   By: Jeb Levering M.D.   On: 05/07/2017 23:01        Scheduled Meds: . albuterol  2.5 mg  Nebulization BID  . amLODipine  5 mg Oral Daily  . aspirin EC  81 mg Oral Q supper  . atorvastatin  20 mg Oral Daily  . cefUROXime  500 mg Oral BID WC  . clopidogrel  75 mg Oral Daily  . donepezil  10 mg Oral Q supper  . enoxaparin (LOVENOX) injection  40 mg Subcutaneous Q24H  . escitalopram  5 mg Oral Q supper  . finasteride  5 mg Oral Q supper  . fluticasone furoate-vilanterol  1 puff Inhalation Daily   And  . umeclidinium bromide  1 puff Inhalation Daily  . insulin aspart  0-5 Units Subcutaneous QHS  . insulin aspart  0-9 Units Subcutaneous TID WC  . insulin aspart  4 Units Subcutaneous BID AC  . irbesartan  300 mg Oral Daily  . memantine  10 mg Oral BID  . mirabegron ER  50 mg Oral Q supper  . montelukast  10 mg Oral Daily  . nebivolol  5 mg Oral Daily  . pantoprazole  40 mg Oral BID  . predniSONE  5 mg Oral Q breakfast  . regadenoson      . regadenoson  0.4 mg Intravenous Once   Continuous Infusions:   LOS: 0 days    Time spent: 15 min    Geradine Girt, DO [facility] Pager [phone]  If 7PM-7AM, please contact night-coverage [email or web address] Password [email or web address] 05/09/2017, 3:35 PM

## 2017-05-09 NOTE — Progress Notes (Signed)
Low risk stress test, no significant arrhythmias on telemetry. Patient is stable for discharge tomorrow morning if no other abnormalities.   Adrian Prows, MD 05/09/2017, 9:47 PM [facility] [place] Pager: [phone] Office: [phone] If no answer: Cell:  [phone]

## 2017-05-10 LAB — GLUCOSE, CAPILLARY
GLUCOSE-CAPILLARY: 118 mg/dL — AB (ref 65–99)
Glucose-Capillary: 249 mg/dL — ABNORMAL HIGH (ref 65–99)

## 2017-05-10 MED ORDER — CEFUROXIME AXETIL 500 MG PO TABS: 500.0000 mg | ORAL_TABLET | Freq: Two times a day (BID) | ORAL | 0 refills | Status: DC

## 2017-05-10 NOTE — Care Management Note (Signed)
Case Management Note  Patient Details  Name: [name] MRN: [id number] Date of Birth: November 01, 1928  Subjective/Objective:  Pt admitted on 05/07/17 s/p fall secondary to possible syncopal episode vs mechanical, UTI, and possible deyhdration.  PTA, pt resided at home with spouse.  He has hx of dementia.                   Action/Plan: PT recommending HH follow up; OT c/s pending.  Will follow for discharge needs as pt progresses.    Expected Discharge Date:                  Expected Discharge Plan:  [place]  In-House Referral:     Discharge planning Services  CM Consult  Post Acute Care Choice:    Choice offered to:     DME Arranged:    DME Agency:     HH Arranged:    [place] Agency:     Status of Service:  In process, will continue to follow  If discussed at Long Length of Stay Meetings, dates discussed:    Additional Comments:  Reinaldo Raddle, RN, BSN  Trauma/Neuro ICU Case Manager [phone]

## 2017-05-10 NOTE — Discharge Summary (Signed)
Physician Discharge Summary  Robert BROADNAX NWG:[id number] DOB: Apr 01, 1929 DOA: 05/07/2017  PCP: Robert Battles, MD  Admit date: 05/07/2017 Discharge date: 05/10/2017   Recommendations for Outpatient Follow-Up:   1. Outpatient 30 day event monitor 2. Home health with 24 hour supervision 3. Outpatient neurology follow up   Discharge Diagnosis:   Principal Problem:   Fall Active Problems:   OSA (obstructive sleep apnea)   COPD (chronic obstructive pulmonary disease) (HCC)   Coronary artery disease   Dementia   Alzheimer's dementia without behavioral disturbance   Discharge disposition:  Home.  Discharge Condition: Improved.  Diet recommendation: Low sodium, heart healthy.  Carbohydrate-modified  Wound care: None.   History of Present Illness:    [name] is a 84 y.o. male with medical history significant of HTN, HLD, DM type II, CAD s/p DES to RCA, dCHF (EF 65-70% w/ grade 1DD in 01/2013), COPD, OSA, dementia, and BPH; who presents after having a fall while getting out of the shower.  History is obtained mostly from his family as he has dementia with poor short-term memory. Patient was found on the floor between the shower and toilet facedown by his wife who heard him fall and noted to have been out for 2-3 minutes.  There was no note that the patient ever stopped breathing and unclear if his wife checked for a pulse at that time.  He sustained a cut above his left eye and the bridge of his nose.  Family suspects that he sustained cuts from the edge of the shower.  His wife reported that the patient regained consciousness briefly before losing consciousness again for 2-3 minutes prior to EMS arrival.  Family states the patient did complain of some mild right-sided shoulder pain.  Otherwise he chronically has wheezing per patient's wife.  Patient has been in otherwise in his normal state of health, and son notes that that have difficulty with getting him to drink enough fluids.   Family notes that he normally ambulates without need of assistance of the p.m. that his gait appears to be unsteady at times.   Hospital Course by Problem:   Fall 2/2 possible syncopal episode vs mechancial:  -tele showing signs of bigeminy/trigeminy  - CE negative - low risk stress test -suspect vasovagal  Leukocytosis: WBC elevated at 14.7 -? Reactive-- trending down  Urinary tract infection: Acute. Urinalysis came back positive for signs of infection. As a possible cause for elevated - Rocephin given in ER but no culture sent before abx -change to PO abx x 5 days  Laceration to the left eye: Patient received sutures in the ED no acute intracranial abnormality noted.   CAD: s/p DES to the RCA in 2005 previously followed by Dr. Cathie [name] - Continue Plavix and aspirin  Possible dehydration: Patient noted to have a increase in his BUN slightly increased. -improved with hydration  Diabetes mellitus type 2: Last available hemoglobin A1c noted to be 8.3 on 02/23/2016 - resume home meds  Essential hypertension - Continue nebivolol, amlodipine, and pharmacy substitution of irbesartan for Micardis -BP all over the place again from 578 sytolically to 469G--- ask nursing to check manually -defer change in meds if needed to cardiology  COPD, without acute exacerbation: Patient on chronic prednisone at 5 mg daily - Continue prednisone, fluticasone-umeclidin, Spiriva -home o2 study---did not de-sat  Dementia: Patient with very poor short-term memory - Continue Aricept and Namenda -was due to follow up with Dr. Jaynee [name] (neurology) -most important issue as  patient does not remember that he is unsteady and needs to use the walker  Chronic kidney disease stage III: Creatinine 1.39 on admission which appears similar to previous check in 2017.  BPH - continue finasteride and tamsulosin  OSA on CPAP - RT to supply CPAP  GERD -Continue Protonix       Medical  Consultants:    cards   Discharge Exam:   Vitals:   05/10/17 0756 05/10/17 0940  BP: (!) 182/77 (!) 150/74  Pulse: 64 75  Resp:    Temp: 98.7 F (37.1 C)   SpO2: 97% 98%   Vitals:   05/10/17 0337 05/10/17 0400 05/10/17 0756 05/10/17 0940  BP:  (!) 163/59 (!) 182/77 (!) 150/74  Pulse:  80 64 75  Resp:  17    Temp: (!) 97.4 F (36.3 C)  98.7 F (37.1 C)   TempSrc: Oral  Oral   SpO2:  98% 97% 98%  Weight:      Height:        Gen:  NAD    The results of significant diagnostics from this hospitalization (including imaging, microbiology, ancillary and laboratory) are listed below for reference.     Procedures and Diagnostic Studies:   Ct Head Wo Contrast  Result Date: 05/07/2017 CLINICAL DATA:  Syncope. EXAM: CT HEAD WITHOUT CONTRAST TECHNIQUE: Contiguous axial images were obtained from the base of the skull through the vertex without intravenous contrast. COMPARISON:  Sep 24, 2014 FINDINGS: Brain: No subdural, epidural, or subarachnoid hemorrhage. Cerebellum, brainstem, and basal cisterns are normal. Ventricles and sulci are unchanged. Severe white matter changes are stable. No acute cortical ischemia or infarct. No mass effect or midline shift. Vascular: Calcified atherosclerosis in the intracranial carotids. Skull: Normal. Negative for fracture or focal lesion. Sinuses/Orbits: There is a mucous retention cyst in the left maxillary sinus. Mucosal thickening scattered throughout the ethmoid sinuses and both maxillary sinuses is identified. Mastoid air cells on the left are normal. Chronic changes are seen in the right mastoid bone and right middle ear, unchanged since May 2016. Visualized bones otherwise normal. Other: There is anterolisthesis of C2 versus C3 measuring 3 mm on the scout view, not seen on the December 07, 2006 CT scan. No definitive acute soft tissue swelling. The scout view is otherwise unremarkable. There is a laceration over the left side of the forehead.  IMPRESSION: 1. There is anterolisthesis of C2 versus C3, measuring 3 mm, on the scout view. This was not present in August of 2008 and is age indeterminate. There is no definitive associated soft tissue swelling. If there is concern in the cervical spine, recommend dedicated cross-sectional imaging. 2. No acute intracranial abnormalities. Chronic white matter changes identified. 3. Sinus disease as above. Chronic changes in the right mastoid bone and middle ear. Electronically Signed   By: Dorise Bullion III M.D   On: 05/07/2017 18:49   Dg Chest Port 1 View  Result Date: 05/07/2017 CLINICAL DATA:  Wheezing.  Syncope today. EXAM: PORTABLE CHEST 1 VIEW COMPARISON:  02/28/2013 FINDINGS: Heart is normal in size. There is aortic atherosclerosis. Retrocardiac hiatal hernia. No pulmonary edema, focal airspace disease or pneumothorax. No large pleural effusion. No acute osseous abnormalities. IMPRESSION: 1. No acute abnormality or explanation for wheezing. 2. Aortic atherosclerosis.  Hiatal hernia. Electronically Signed   By: Jeb Levering M.D.   On: 05/07/2017 23:01     Labs:   Basic Metabolic Panel: Recent Labs  Lab 05/07/17 1652 05/07/17 2200 05/08/17 0150 05/09/17  0534  NA 139  --  139 142  K 4.6  --  4.1 4.0  CL 104  --  106 104  CO2 25  --  24 23  GLUCOSE 152*  --  95 124*  BUN 26*  --  24* 22*  CREATININE 1.39*  --  1.19 1.17  CALCIUM 9.1  --  9.0 8.9  MG  --  1.8  --   --    GFR Estimated Creatinine Clearance: 42.4 mL/min (by C-G formula based on SCr of 1.17 mg/dL). Liver Function Tests: No results for input(s): AST, ALT, ALKPHOS, BILITOT, PROT, ALBUMIN in the last 168 hours. No results for input(s): LIPASE, AMYLASE in the last 168 hours. No results for input(s): AMMONIA in the last 168 hours. Coagulation profile No results for input(s): INR, PROTIME in the last 168 hours.  CBC: Recent Labs  Lab 05/07/17 1652 05/08/17 0150 05/09/17 0534  WBC 14.7* 14.4* 11.2*  NEUTROABS  11.7*  --   --   HGB 11.5* 11.7* 11.8*  HCT 36.5* 37.2* 36.4*  MCV 84.5 83.4 84.3  PLT 170 183 174   Cardiac Enzymes: Recent Labs  Lab 05/07/17 2200 05/08/17 0150  TROPONINI <0.03 <0.03   BNP: Invalid input(s): POCBNP CBG: Recent Labs  Lab 05/09/17 0738 05/09/17 1159 05/09/17 1640 05/09/17 2137 05/10/17 0800  GLUCAP 131* 202* 329* 204* 118*   D-Dimer No results for input(s): DDIMER in the last 72 hours. Hgb A1c No results for input(s): HGBA1C in the last 72 hours. Lipid Profile No results for input(s): CHOL, HDL, LDLCALC, TRIG, CHOLHDL, LDLDIRECT in the last 72 hours. Thyroid function studies Recent Labs    05/08/17 0150  TSH 2.021   Anemia work up No results for input(s): VITAMINB12, FOLATE, FERRITIN, TIBC, IRON, RETICCTPCT in the last 72 hours. Microbiology No results found for this or any previous visit (from the past 240 hour(s)).   Discharge Instructions:   Discharge Instructions    Diet - low sodium heart healthy   Complete by:  As directed    Diet Carb Modified   Complete by:  As directed    Discharge instructions   Complete by:  As directed    Home health PT 30 day event monitor with Dr. Einar Gip Use walker at all times until PT tells you otherwise!!   Increase activity slowly   Complete by:  As directed      Allergies as of 05/10/2017      Reactions   Ciprofloxacin Hcl Other (See Comments)   Unknown reaction   Mirabegron Other (See Comments)   FATIGUE   Penicillins    Has patient had a PCN reaction causing immediate rash, facial/tongue/throat swelling, SOB or lightheadedness with hypotension: Unknown Has patient had a PCN reaction causing severe rash involving mucus membranes or skin necrosis: Unknown Has patient had a PCN reaction that required hospitalization Unknown Has patient had a PCN reaction occurring within the last 10 years: Unknown If all of the above answers are "NO", then may proceed with Cephalosporin use.   Sulfa Antibiotics  Hives, Rash   Sulfamethoxazole Rash   Sulfonamide Derivatives Rash      Medication List    TAKE these medications   albuterol 108 (90 Base) MCG/ACT inhaler Commonly known as:  PROVENTIL HFA;VENTOLIN HFA Inhale 2 puffs into the lungs every 6 (six) hours as needed for wheezing or shortness of breath.   albuterol (2.5 MG/3ML) 0.083% nebulizer solution Commonly known as:  PROVENTIL Take 0.83 mg  by nebulization 2 (two) times daily.   amLODipine 5 MG tablet Commonly known as:  NORVASC Take 5 mg by mouth daily.   amoxicillin 500 MG capsule Commonly known as:  AMOXIL Take 2,000 mg by mouth See admin instructions. Take 4 capsules (2000 mg) by mouth one hour prior to dental appointment (last appointment November 2018)   aspirin EC 81 MG tablet Take 81 mg by mouth daily with supper.   atorvastatin 20 MG tablet Commonly known as:  LIPITOR Take 20 mg by mouth daily.   cefUROXime 500 MG tablet Commonly known as:  CEFTIN Take 1 tablet (500 mg total) by mouth 2 (two) times daily with a meal.   clopidogrel 75 MG tablet Commonly known as:  PLAVIX Take 1 tablet (75 mg total) by mouth daily.   donepezil 10 MG tablet Commonly known as:  ARICEPT Take 10 mg by mouth daily with supper.   escitalopram 5 MG tablet Commonly known as:  LEXAPRO Take 5 mg by mouth daily with supper.   ferrous sulfate 324 (65 Fe) MG Tbec Take 324 mg by mouth daily with supper.   finasteride 5 MG tablet Commonly known as:  PROSCAR Take 5 mg by mouth daily with supper.   HUMALOG KWIKPEN 100 UNIT/ML KiwkPen Generic drug:  insulin lispro Inject 4 Units into the skin See admin instructions. Inject 4 units subcutaneously twice daily - after lunch and before supper   loratadine 10 MG tablet Commonly known as:  CLARITIN Take 10 mg by mouth daily.   Melatonin 5 MG Tabs Take 5 mg by mouth at bedtime.   memantine 10 MG tablet Commonly known as:  NAMENDA Take 10 mg by mouth 2 (two) times daily.     montelukast 10 MG tablet Commonly known as:  SINGULAIR Take 10 mg by mouth daily.   multivitamin with minerals Tabs tablet Take 1 tablet by mouth daily.   MYRBETRIQ 50 MG Tb24 tablet Generic drug:  mirabegron ER Take 50 mg by mouth daily with supper.   nebivolol 5 MG tablet Commonly known as:  BYSTOLIC Take 5 mg by mouth daily.   nitroGLYCERIN 0.4 MG SL tablet Commonly known as:  NITROSTAT Place 0.4 mg under the tongue every 5 (five) minutes as needed for chest pain.   pantoprazole 40 MG tablet Commonly known as:  PROTONIX Take 40 mg by mouth 2 (two) times daily.   predniSONE 5 MG tablet Commonly known as:  DELTASONE Take 5 mg by mouth daily with breakfast.   sitaGLIPtin-metformin 50-1000 MG tablet Commonly known as:  JANUMET Take 1 tablet by mouth 2 (two) times daily with a meal.   telmisartan 80 MG tablet Commonly known as:  MICARDIS Take 80 mg by mouth daily with supper.   TRELEGY ELLIPTA 100-62.5-25 MCG/INH Aepb Generic drug:  Fluticasone-Umeclidin-Vilant Inhale 1 puff into the lungs daily.   vitamin C 500 MG tablet Commonly known as:  ASCORBIC ACID Take 500 mg by mouth daily with supper.   Vitamin D 2000 units Caps Take 2,000 Units by mouth daily with supper.            Durable Medical Equipment  (From admission, onward)        Start     Ordered   05/09/17 1535  For home use only DME Walker rolling  Once    Question:  Patient needs a walker to treat with the following condition  Answer:  Balance disorder   05/09/17 1535   05/09/17 1520  For  home use only DME 3 n 1  Once     05/09/17 1535     Follow-up Information    Robert Battles, MD Follow up in 1 week(s).   Specialty:  Internal Medicine Contact information: [address] [place] [address] [phone]            Time coordinating discharge: 35 min  Signed:  Geradine Girt   [facility] 05/10/2017, 11:29 AM

## 2017-05-10 NOTE — Progress Notes (Signed)
Subjective:  Feels well this morning No chest pain No shortness of breath No syncopal episodes.  Telemetry shows episodes of atrial couplets, PAS,PVC, but no Afib, flutter, SVT, high grade AV block, sinus pauses  Objective:  Vital Signs in the last 24 hours: Temp:  [97.4 F (36.3 C)-98.3 F (36.8 C)] 97.4 F (36.3 C) (01/09 0337) Pulse Rate:  [45-88] 80 (01/09 0400) Resp:  [15-19] 17 (01/09 0400) BP: (119-192)/(50-81) 163/59 (01/09 0400) SpO2:  [98 %-100 %] 98 % (01/09 0400)  Intake/Output from previous day: No intake/output data recorded. Intake/Output from this shift: No intake/output data recorded.  Physical Exam: Physical Exam  Nursing note and vitals reviewed. Constitutional: He is oriented to person, place, and time. He appears well-developed and well-nourished. No distress.  HENT:  Healing left forehead wound  Eyes:  Left orbit ecchymosis  Neck: Normal range of motion. Neck supple. No JVD present.  Cardiovascular: Normal rate, regular rhythm and normal heart sounds.  No murmur heard. Respiratory: Effort normal and breath sounds normal. He has no wheezes. He has no rales.  GI: Soft. Bowel sounds are normal. There is no tenderness.  Musculoskeletal: Normal range of motion. He exhibits no edema.  Neurological: He is alert and oriented to person, place, and time.  Skin: Skin is dry.     Lab Results: Recent Labs    05/08/17 0150 05/09/17 0534  WBC 14.4* 11.2*  HGB 11.7* 11.8*  PLT 183 174   Recent Labs    05/08/17 0150 05/09/17 0534  NA 139 142  K 4.1 4.0  CL 106 104  CO2 24 23  GLUCOSE 95 124*  BUN 24* 22*  CREATININE 1.19 1.17   Recent Labs    05/07/17 2200 05/08/17 0150  TROPONINI <0.03 <0.03    Imaging: CT head 05/07/17: IMPRESSION: 1. There is anterolisthesis of C2 versus C3, measuring 3 mm, on the scout view. This was not present in August of 2008 and is age indeterminate. There is no definitive associated soft tissue swelling. If  there is concern in the cervical spine, recommend dedicated cross-sectional imaging. 2. No acute intracranial abnormalities. Chronic white matter changes identified. 3. Sinus disease as above. Chronic changes in the right mastoid bone and middle ear.    Cardiac Studies: 05/09/17: IMPRESSION: 1. Possible small area of reversibility involving the mid inferior wall.  2. Normal left ventricular wall motion.  3. Left ventricular ejection fraction 60%  4. Non invasive risk stratification*: Low  *2012 Appropriate Use Criteria for Coronary Revascularization Focused Update: J Am Coll Cardiol. 2297;98(9):211-941. http://content.airportbarriers.com.aspx?articleid=1201161   Assessment:  84 year old indigent male with coronary artery disease status post RCA stent in 2005, type 2 diabetes mellitus, hypertension, dementia, admitted with syncopal episode, likely vasovagal.  Lower stress test with small area of reversibility in the inferior myocardium. Telemetry shows atrial couplets, PAC, PVC but no A. fib, flutter, high degree AV block, sinus pauses, asystole.  Recommendations: Can be discharged home from cardiac standpoint. Continue baseline medications. I have asked the patient to come to our office to pick up an event monitor to rule out any arrhythmogenic etiology for his syncope.    LOS: 1 day    Nadiyah Zeis J Deontez Klinke 05/10/2017, 7:11 AM  Julietta Batterman Esther Hardy, MD [facility] [place] Pager: [phone] Office: [phone] If no answer Cell [phone]

## 2017-05-10 NOTE — Care Management Note (Signed)
Case Management Note  Patient Details  Name: [name] MRN: [id number] Date of Birth: 1928-07-12  Subjective/Objective:  Pt admitted on 05/07/17 s/p fall secondary to possible syncopal episode vs mechanical, UTI, and possible deyhdration.  PTA, pt resided at home with spouse.  He has hx of dementia.                   Action/Plan: PT recommending HH follow up; OT c/s pending.  Will follow for discharge needs as pt progresses.    Expected Discharge Date:  05/10/17               Expected Discharge Plan:  [place]  In-House Referral:     Discharge planning Services  CM Consult  Post Acute Care Choice:  Home Health Choice offered to:  Patient, Spouse  DME Arranged:  3-N-1, Walker rolling DME Agency:  [place]:  PT, OT, Nurse's Aide [place] Agency:  [place]  Status of Service:  Completed, signed off  If discussed at [place] of Stay Meetings, dates discussed:    Additional Comments:  J. Airen Dales, RN, BSN Pt medically stable for discharge home today with wife.  HH and DME orders written by MD.  Referral to [facility] for [facility] needs, per pt choice; start of care 24-48h post dc date.  [facility] to deliver 3 in 1 and RW to pt's room prior to dc.    Reinaldo Raddle, RN, BSN  Trauma/Neuro ICU Case Manager [phone]

## 2017-05-10 NOTE — Evaluation (Signed)
Occupational Therapy Evaluation Patient Details Name: [name] MRN: [id number] DOB: [birth year] Today's Date: 05/10/2017    History of Present Illness 84 y.o. male with medical history significant of HTN, HLD, DM type II, CAD, CHF, COPD, OSA, dementia, and BPH. Admitted with fall at home getting out of shower. Pt with UTI and laceration to above left eye   Clinical Impression   PTA, pt was living with his wife and was performing his ADLs. Pt currently performing ADLs at [facility] level for safety. Pt providing wife and pt on fall prevention education including techniques for bathing and dressing. Educated pt and wife on use of 3N1 as shower seat and placing a grab bar at walk-in shower to increase safety. Provided fall prevention handout to increase carry over to home. Answering all pt and family questions in preparation for dc later today. Recommend dc home with 24 hour supervision. All acute OT needs met and will sign off.    Follow Up Recommendations  No OT follow up;Supervision/Assistance - 24 hour   Equipment Recommendations  3 in 1 bedside commode    Recommendations for Other Services       Precautions / Restrictions Precautions Precautions: Fall Restrictions Weight Bearing Restrictions: No      Mobility Bed Mobility Overal bed mobility: Modified Independent                Transfers Overall transfer level: Modified independent                    Balance Overall balance assessment: Needs assistance;History of Falls   Sitting balance-Leahy Scale: Good       Standing balance-Leahy Scale: Fair                             ADL either performed or assessed with clinical judgement   ADL Overall ADL's : Needs assistance/impaired Eating/Feeding: Set up;Sitting   Grooming: Set up;Supervision/safety;Standing   Upper Body Bathing: Set up;Sitting   Lower Body Bathing: Min guard;Sit to/from stand   Upper Body Dressing : Set  up;Standing;Supervision/safety;With caregiver independent assisting Upper Body Dressing Details (indicate cue type and reason): Donned shirt while standing Lower Body Dressing: Min guard;Sit to/from stand;With caregiver independent assisting Lower Body Dressing Details (indicate cue type and reason): Pt donning underwear and pants with Min guard for safety Toilet Transfer: Min guard;Ambulation         Tub/Shower Transfer Details (indicate cue type and reason): Discussed use of 3N1 as a shower chair to decrease fall risk. wife and pt verbalizing understanding. Also recommended placement of grab bars in shower. Functional mobility during ADLs: Min guard General ADL Comments: Pt performing ADLs at supervision-Min guard level. Providing pt and wife on fall prevention education; also giving handout.      Vision         Perception     Praxis      Pertinent Vitals/Pain Pain Assessment: No/denies pain     Hand Dominance Right   Extremity/Trunk Assessment Upper Extremity Assessment Upper Extremity Assessment: Overall WFL for tasks assessed   Lower Extremity Assessment Lower Extremity Assessment: Overall WFL for tasks assessed   Cervical / Trunk Assessment Cervical / Trunk Assessment: Kyphotic   Communication Communication Communication: No difficulties   Cognition Arousal/Alertness: Awake/alert Behavior During Therapy: WFL for tasks assessed/performed Overall Cognitive Status: History of cognitive impairments - at baseline  General Comments: not oriented to time which is baseline. Pt stating he drives which he does not   General Comments  Wife present throughout session    Exercises     Shoulder Instructions      Home Living Family/patient expects to be discharged to:: Private residence Living Arrangements: Spouse/significant other Available Help at Discharge: Family;Available 24 hours/day Type of Home: House Home Access:  Stairs to enter [employer] of Steps: 3 Entrance Stairs-Rails: Right Home Layout: Two level;Able to live on main level with bedroom/bathroom     Bathroom Shower/Tub: Tub/shower unit;Walk-in shower   Bathroom Toilet: Standard     Home Equipment: Environmental consultant - 2 wheels   Additional Comments: wife unsure if RW is still at home      Prior Functioning/Environment Level of Independence: Independent        Comments: assist for medication, does not drive, wife does the homemaking        OT Problem List: Decreased activity tolerance;Impaired balance (sitting and/or standing);Decreased knowledge of use of DME or AE      OT Treatment/Interventions:      OT Goals(Current goals can be found in the care plan section) Acute Rehab OT Goals Patient Stated Goal: return home OT Goal Formulation: All assessment and education complete, DC therapy  OT Frequency:     Barriers to D/C:            Co-evaluation              AM-PAC PT "6 Clicks" Daily Activity     Outcome Measure Help from another person eating meals?: None Help from another person taking care of personal grooming?: A Little Help from another person toileting, which includes using toliet, bedpan, or urinal?: A Little Help from another person bathing (including washing, rinsing, drying)?: A Little Help from another person to put on and taking off regular upper body clothing?: None Help from another person to put on and taking off regular lower body clothing?: A Little 6 Click Score: 20   End of Session Nurse Communication: Mobility status  Activity Tolerance: Patient tolerated treatment well Patient left: in bed;with call bell/phone within reach;with family/visitor present;with nursing/sitter in room  OT Visit Diagnosis: Unsteadiness on feet (R26.81)                Time: 8288-3374 OT Time Calculation (min): 22 min Charges:  OT General Charges $OT Visit: 1 Visit OT Evaluation $OT Eval Low Complexity: 1  Low G-Codes:     [place], OTR/L Acute Rehab Pager: [phone] Office: [place] 05/10/2017, 12:35 PM

## 2019-02-22 ENCOUNTER — Encounter (INDEPENDENT_AMBULATORY_CARE_PROVIDER_SITE_OTHER): Payer: Self-pay

## 2019-05-31 ENCOUNTER — Ambulatory Visit: Payer: Medicare Other

## 2019-06-04 DIAGNOSIS — N1831 Chronic kidney disease, stage 3a: Secondary | ICD-10-CM | POA: Diagnosis present

## 2019-11-05 DIAGNOSIS — M7022 Olecranon bursitis, left elbow: Secondary | ICD-10-CM | POA: Diagnosis not present

## 2019-11-06 ENCOUNTER — Inpatient Hospital Stay (HOSPITAL_COMMUNITY)
Admission: EM | Admit: 2019-11-06 | Discharge: 2019-11-13 | DRG: 264 | Disposition: A | Discharge status: Skilled Nursing Facility | Payer: Medicare PPO | Attending: Internal Medicine | Admitting: Internal Medicine

## 2019-11-06 ENCOUNTER — Inpatient Hospital Stay (HOSPITAL_COMMUNITY): Payer: Medicare PPO

## 2019-11-06 ENCOUNTER — Emergency Department (HOSPITAL_COMMUNITY): Payer: Medicare PPO

## 2019-11-06 ENCOUNTER — Encounter (HOSPITAL_COMMUNITY): Payer: Self-pay

## 2019-11-06 DIAGNOSIS — N400 Enlarged prostate without lower urinary tract symptoms: Secondary | ICD-10-CM | POA: Diagnosis present

## 2019-11-06 DIAGNOSIS — Z955 Presence of coronary angioplasty implant and graft: Secondary | ICD-10-CM

## 2019-11-06 DIAGNOSIS — J90 Pleural effusion, not elsewhere classified: Secondary | ICD-10-CM | POA: Diagnosis not present

## 2019-11-06 DIAGNOSIS — N179 Acute kidney failure, unspecified: Secondary | ICD-10-CM

## 2019-11-06 DIAGNOSIS — C641 Malignant neoplasm of right kidney, except renal pelvis: Secondary | ICD-10-CM | POA: Diagnosis present

## 2019-11-06 DIAGNOSIS — E2749 Other adrenocortical insufficiency: Secondary | ICD-10-CM | POA: Diagnosis present

## 2019-11-06 DIAGNOSIS — A4101 Sepsis due to Methicillin susceptible Staphylococcus aureus: Secondary | ICD-10-CM | POA: Diagnosis present

## 2019-11-06 DIAGNOSIS — J302 Other seasonal allergic rhinitis: Secondary | ICD-10-CM | POA: Diagnosis present

## 2019-11-06 DIAGNOSIS — I130 Hypertensive heart and chronic kidney disease with heart failure and stage 1 through stage 4 chronic kidney disease, or unspecified chronic kidney disease: Secondary | ICD-10-CM | POA: Diagnosis not present

## 2019-11-06 DIAGNOSIS — L03114 Cellulitis of left upper limb: Secondary | ICD-10-CM | POA: Diagnosis present

## 2019-11-06 DIAGNOSIS — E273 Drug-induced adrenocortical insufficiency: Secondary | ICD-10-CM | POA: Diagnosis present

## 2019-11-06 DIAGNOSIS — G9341 Metabolic encephalopathy: Secondary | ICD-10-CM | POA: Diagnosis not present

## 2019-11-06 DIAGNOSIS — Z794 Long term (current) use of insulin: Secondary | ICD-10-CM

## 2019-11-06 DIAGNOSIS — N1831 Chronic kidney disease, stage 3a: Secondary | ICD-10-CM | POA: Diagnosis not present

## 2019-11-06 DIAGNOSIS — Z7902 Long term (current) use of antithrombotics/antiplatelets: Secondary | ICD-10-CM

## 2019-11-06 DIAGNOSIS — K219 Gastro-esophageal reflux disease without esophagitis: Secondary | ICD-10-CM | POA: Diagnosis present

## 2019-11-06 DIAGNOSIS — R279 Unspecified lack of coordination: Secondary | ICD-10-CM | POA: Diagnosis not present

## 2019-11-06 DIAGNOSIS — N281 Cyst of kidney, acquired: Secondary | ICD-10-CM | POA: Diagnosis not present

## 2019-11-06 DIAGNOSIS — I5033 Acute on chronic diastolic (congestive) heart failure: Secondary | ICD-10-CM | POA: Diagnosis not present

## 2019-11-06 DIAGNOSIS — J449 Chronic obstructive pulmonary disease, unspecified: Secondary | ICD-10-CM | POA: Diagnosis not present

## 2019-11-06 DIAGNOSIS — Z8673 Personal history of transient ischemic attack (TIA), and cerebral infarction without residual deficits: Secondary | ICD-10-CM

## 2019-11-06 DIAGNOSIS — N3281 Overactive bladder: Secondary | ICD-10-CM | POA: Diagnosis present

## 2019-11-06 DIAGNOSIS — R0602 Shortness of breath: Secondary | ICD-10-CM | POA: Diagnosis not present

## 2019-11-06 DIAGNOSIS — J441 Chronic obstructive pulmonary disease with (acute) exacerbation: Secondary | ICD-10-CM | POA: Diagnosis not present

## 2019-11-06 DIAGNOSIS — E875 Hyperkalemia: Secondary | ICD-10-CM | POA: Diagnosis not present

## 2019-11-06 DIAGNOSIS — Z888 Allergy status to other drugs, medicaments and biological substances status: Secondary | ICD-10-CM

## 2019-11-06 DIAGNOSIS — Z880 Allergy status to penicillin: Secondary | ICD-10-CM

## 2019-11-06 DIAGNOSIS — R001 Bradycardia, unspecified: Secondary | ICD-10-CM | POA: Diagnosis not present

## 2019-11-06 DIAGNOSIS — M79602 Pain in left arm: Secondary | ICD-10-CM | POA: Diagnosis not present

## 2019-11-06 DIAGNOSIS — I509 Heart failure, unspecified: Secondary | ICD-10-CM

## 2019-11-06 DIAGNOSIS — M71122 Other infective bursitis, left elbow: Secondary | ICD-10-CM | POA: Diagnosis not present

## 2019-11-06 DIAGNOSIS — Z743 Need for continuous supervision: Secondary | ICD-10-CM | POA: Diagnosis not present

## 2019-11-06 DIAGNOSIS — M1990 Unspecified osteoarthritis, unspecified site: Secondary | ICD-10-CM | POA: Diagnosis present

## 2019-11-06 DIAGNOSIS — Z7952 Long term (current) use of systemic steroids: Secondary | ICD-10-CM

## 2019-11-06 DIAGNOSIS — I10 Essential (primary) hypertension: Secondary | ICD-10-CM | POA: Diagnosis not present

## 2019-11-06 DIAGNOSIS — R52 Pain, unspecified: Secondary | ICD-10-CM | POA: Diagnosis not present

## 2019-11-06 DIAGNOSIS — F0390 Unspecified dementia without behavioral disturbance: Secondary | ICD-10-CM | POA: Diagnosis present

## 2019-11-06 DIAGNOSIS — M7989 Other specified soft tissue disorders: Secondary | ICD-10-CM | POA: Diagnosis present

## 2019-11-06 DIAGNOSIS — M7022 Olecranon bursitis, left elbow: Secondary | ICD-10-CM | POA: Diagnosis not present

## 2019-11-06 DIAGNOSIS — Z20822 Contact with and (suspected) exposure to covid-19: Secondary | ICD-10-CM | POA: Diagnosis not present

## 2019-11-06 DIAGNOSIS — N170 Acute kidney failure with tubular necrosis: Secondary | ICD-10-CM | POA: Diagnosis not present

## 2019-11-06 DIAGNOSIS — I25118 Atherosclerotic heart disease of native coronary artery with other forms of angina pectoris: Secondary | ICD-10-CM | POA: Diagnosis not present

## 2019-11-06 DIAGNOSIS — Z882 Allergy status to sulfonamides status: Secondary | ICD-10-CM

## 2019-11-06 DIAGNOSIS — R440 Auditory hallucinations: Secondary | ICD-10-CM | POA: Diagnosis present

## 2019-11-06 DIAGNOSIS — I2510 Atherosclerotic heart disease of native coronary artery without angina pectoris: Secondary | ICD-10-CM | POA: Diagnosis not present

## 2019-11-06 DIAGNOSIS — I25119 Atherosclerotic heart disease of native coronary artery with unspecified angina pectoris: Secondary | ICD-10-CM | POA: Diagnosis present

## 2019-11-06 DIAGNOSIS — J9811 Atelectasis: Secondary | ICD-10-CM | POA: Diagnosis present

## 2019-11-06 DIAGNOSIS — Z87891 Personal history of nicotine dependence: Secondary | ICD-10-CM

## 2019-11-06 DIAGNOSIS — R609 Edema, unspecified: Secondary | ICD-10-CM | POA: Diagnosis not present

## 2019-11-06 DIAGNOSIS — H903 Sensorineural hearing loss, bilateral: Secondary | ICD-10-CM | POA: Diagnosis present

## 2019-11-06 DIAGNOSIS — K439 Ventral hernia without obstruction or gangrene: Secondary | ICD-10-CM | POA: Diagnosis not present

## 2019-11-06 DIAGNOSIS — E1129 Type 2 diabetes mellitus with other diabetic kidney complication: Secondary | ICD-10-CM | POA: Diagnosis not present

## 2019-11-06 DIAGNOSIS — J811 Chronic pulmonary edema: Secondary | ICD-10-CM | POA: Diagnosis not present

## 2019-11-06 DIAGNOSIS — N178 Other acute kidney failure: Secondary | ICD-10-CM | POA: Diagnosis not present

## 2019-11-06 DIAGNOSIS — D631 Anemia in chronic kidney disease: Secondary | ICD-10-CM | POA: Diagnosis not present

## 2019-11-06 DIAGNOSIS — A419 Sepsis, unspecified organism: Secondary | ICD-10-CM | POA: Diagnosis not present

## 2019-11-06 DIAGNOSIS — Z7951 Long term (current) use of inhaled steroids: Secondary | ICD-10-CM

## 2019-11-06 DIAGNOSIS — J9601 Acute respiratory failure with hypoxia: Secondary | ICD-10-CM | POA: Diagnosis present

## 2019-11-06 DIAGNOSIS — I110 Hypertensive heart disease with heart failure: Secondary | ICD-10-CM | POA: Diagnosis not present

## 2019-11-06 DIAGNOSIS — D509 Iron deficiency anemia, unspecified: Secondary | ICD-10-CM | POA: Diagnosis present

## 2019-11-06 DIAGNOSIS — E872 Acidosis: Secondary | ICD-10-CM | POA: Diagnosis present

## 2019-11-06 DIAGNOSIS — Z66 Do not resuscitate: Secondary | ICD-10-CM | POA: Diagnosis not present

## 2019-11-06 DIAGNOSIS — G4733 Obstructive sleep apnea (adult) (pediatric): Secondary | ICD-10-CM | POA: Diagnosis present

## 2019-11-06 DIAGNOSIS — R600 Localized edema: Secondary | ICD-10-CM | POA: Diagnosis not present

## 2019-11-06 DIAGNOSIS — Z79899 Other long term (current) drug therapy: Secondary | ICD-10-CM

## 2019-11-06 DIAGNOSIS — Z825 Family history of asthma and other chronic lower respiratory diseases: Secondary | ICD-10-CM

## 2019-11-06 DIAGNOSIS — K5730 Diverticulosis of large intestine without perforation or abscess without bleeding: Secondary | ICD-10-CM | POA: Diagnosis not present

## 2019-11-06 DIAGNOSIS — N184 Chronic kidney disease, stage 4 (severe): Secondary | ICD-10-CM | POA: Diagnosis not present

## 2019-11-06 DIAGNOSIS — I129 Hypertensive chronic kidney disease with stage 1 through stage 4 chronic kidney disease, or unspecified chronic kidney disease: Secondary | ICD-10-CM | POA: Diagnosis not present

## 2019-11-06 DIAGNOSIS — N2889 Other specified disorders of kidney and ureter: Secondary | ICD-10-CM | POA: Diagnosis not present

## 2019-11-06 DIAGNOSIS — I517 Cardiomegaly: Secondary | ICD-10-CM | POA: Diagnosis not present

## 2019-11-06 DIAGNOSIS — K429 Umbilical hernia without obstruction or gangrene: Secondary | ICD-10-CM | POA: Diagnosis not present

## 2019-11-06 DIAGNOSIS — E1122 Type 2 diabetes mellitus with diabetic chronic kidney disease: Secondary | ICD-10-CM | POA: Diagnosis present

## 2019-11-06 DIAGNOSIS — Z7982 Long term (current) use of aspirin: Secondary | ICD-10-CM

## 2019-11-06 DIAGNOSIS — R6520 Severe sepsis without septic shock: Secondary | ICD-10-CM | POA: Diagnosis not present

## 2019-11-06 DIAGNOSIS — E785 Hyperlipidemia, unspecified: Secondary | ICD-10-CM | POA: Diagnosis present

## 2019-11-06 DIAGNOSIS — Z881 Allergy status to other antibiotic agents status: Secondary | ICD-10-CM

## 2019-11-06 DIAGNOSIS — J810 Acute pulmonary edema: Secondary | ICD-10-CM | POA: Diagnosis not present

## 2019-11-06 DIAGNOSIS — Q600 Renal agenesis, unilateral: Secondary | ICD-10-CM | POA: Diagnosis not present

## 2019-11-06 DIAGNOSIS — N390 Urinary tract infection, site not specified: Secondary | ICD-10-CM | POA: Diagnosis present

## 2019-11-06 DIAGNOSIS — M71112 Other infective bursitis, left shoulder: Secondary | ICD-10-CM | POA: Diagnosis not present

## 2019-11-06 DIAGNOSIS — G309 Alzheimer's disease, unspecified: Secondary | ICD-10-CM | POA: Diagnosis not present

## 2019-11-06 LAB — I-STAT ARTERIAL BLOOD GAS, ED
Acid-base deficit: 3 mmol/L — ABNORMAL HIGH (ref 0.0–2.0)
Bicarbonate: 22.1 mmol/L (ref 20.0–28.0)
Calcium, Ion: 1.3 mmol/L (ref 1.15–1.40)
HCT: 25 % — ABNORMAL LOW (ref 39.0–52.0)
Hemoglobin: 8.5 g/dL — ABNORMAL LOW (ref 13.0–17.0)
O2 Saturation: 95 %
Potassium: 5.4 mmol/L — ABNORMAL HIGH (ref 3.5–5.1)
Sodium: 139 mmol/L (ref 135–145)
TCO2: 23 mmol/L (ref 22–32)
pCO2 arterial: 38.4 mmHg (ref 32.0–48.0)
pH, Arterial: 7.369 (ref 7.350–7.450)
pO2, Arterial: 78 mmHg — ABNORMAL LOW (ref 83.0–108.0)

## 2019-11-06 LAB — BASIC METABOLIC PANEL
Anion gap: 12 (ref 5–15)
BUN: 40 mg/dL — ABNORMAL HIGH (ref 8–23)
CO2: 19 mmol/L — ABNORMAL LOW (ref 22–32)
Calcium: 9 mg/dL (ref 8.9–10.3)
Chloride: 106 mmol/L (ref 98–111)
Creatinine, Ser: 2.32 mg/dL — ABNORMAL HIGH (ref 0.61–1.24)
GFR calc Af Amer: 28 mL/min — ABNORMAL LOW (ref >60)
GFR calc non Af Amer: 24 mL/min — ABNORMAL LOW (ref >60)
Glucose, Bld: 190 mg/dL — ABNORMAL HIGH (ref 70–99)
Potassium: 6.2 mmol/L — ABNORMAL HIGH (ref 3.5–5.1)
Sodium: 137 mmol/L (ref 135–145)

## 2019-11-06 LAB — BRAIN NATRIURETIC PEPTIDE: B Natriuretic Peptide: 477.3 pg/mL — ABNORMAL HIGH (ref 0.0–100.0)

## 2019-11-06 LAB — URINALYSIS, ROUTINE W REFLEX MICROSCOPIC
Bilirubin Urine: NEGATIVE
Glucose, UA: 50 mg/dL — AB
Hgb urine dipstick: NEGATIVE
Ketones, ur: NEGATIVE mg/dL
Nitrite: NEGATIVE
Protein, ur: 100 mg/dL — AB
Specific Gravity, Urine: 1.021 (ref 1.005–1.030)
WBC, UA: >50 WBC/hpf — ABNORMAL HIGH (ref 0–5)
pH: 5 (ref 5.0–8.0)

## 2019-11-06 LAB — CBC
HCT: 28.9 % — ABNORMAL LOW (ref 39.0–52.0)
Hemoglobin: 9 g/dL — ABNORMAL LOW (ref 13.0–17.0)
MCH: 24.9 pg — ABNORMAL LOW (ref 26.0–34.0)
MCHC: 31.1 g/dL (ref 30.0–36.0)
MCV: 79.8 fL — ABNORMAL LOW (ref 80.0–100.0)
Platelets: 214 10*3/uL (ref 150–400)
RBC: 3.62 MIL/uL — ABNORMAL LOW (ref 4.22–5.81)
RDW: 17.7 % — ABNORMAL HIGH (ref 11.5–15.5)
WBC: 14.5 10*3/uL — ABNORMAL HIGH (ref 4.0–10.5)
nRBC: 0.1 % (ref 0.0–0.2)

## 2019-11-06 LAB — TROPONIN I (HIGH SENSITIVITY)
Troponin I (High Sensitivity): 11 ng/L (ref <18)
Troponin I (High Sensitivity): 12 ng/L (ref <18)

## 2019-11-06 LAB — LACTIC ACID, PLASMA
Lactic Acid, Venous: 3.5 mmol/L (ref 0.5–1.9)
Lactic Acid, Venous: 1.9 mmol/L (ref 0.5–1.9)

## 2019-11-06 LAB — CBG MONITORING, ED
Glucose-Capillary: 118 mg/dL — ABNORMAL HIGH (ref 70–99)
Glucose-Capillary: 61 mg/dL — ABNORMAL LOW (ref 70–99)
Glucose-Capillary: 115 mg/dL — ABNORMAL HIGH (ref 70–99)

## 2019-11-06 LAB — SARS CORONAVIRUS 2 BY RT PCR (HOSPITAL ORDER, PERFORMED IN ~~LOC~~ HOSPITAL LAB): SARS Coronavirus 2: NEGATIVE

## 2019-11-06 LAB — POTASSIUM: Potassium: 6.4 mmol/L (ref 3.5–5.1)

## 2019-11-06 MED ORDER — ALBUTEROL SULFATE (2.5 MG/3ML) 0.083% IN NEBU: 0.8300 mg | INHALATION_SOLUTION | Freq: Two times a day (BID) | RESPIRATORY_TRACT | Status: DC

## 2019-11-06 MED ORDER — HEPARIN SODIUM (PORCINE) 5000 UNIT/ML IJ SOLN
5000.0000 [IU] | Freq: Two times a day (BID) | INTRAMUSCULAR | Status: DC
  Administered 2019-11-06 – 2019-11-13 (×14): 5000 [IU] via SUBCUTANEOUS
  Filled 2019-11-06 (×14): qty 1

## 2019-11-06 MED ORDER — ENOXAPARIN SODIUM 30 MG/0.3ML ~~LOC~~ SOLN
30.0000 mg | Freq: Two times a day (BID) | SUBCUTANEOUS | Status: DC
Start: 2019-11-06 — End: 2019-11-06

## 2019-11-06 MED ORDER — ADULT MULTIVITAMIN W/MINERALS CH
1.0000 | ORAL_TABLET | Freq: Every day | ORAL | Status: DC
  Administered 2019-11-06 – 2019-11-13 (×8): 1 via ORAL
  Filled 2019-11-06 (×8): qty 1

## 2019-11-06 MED ORDER — FLUTICASONE-UMECLIDIN-VILANT 100-62.5-25 MCG/INH IN AEPB: 1.0000 | INHALATION_SPRAY | Freq: Every day | RESPIRATORY_TRACT | Status: DC

## 2019-11-06 MED ORDER — ONDANSETRON HCL 4 MG/2ML IJ SOLN: 4.0000 mg | Freq: Four times a day (QID) | INTRAMUSCULAR | Status: DC | PRN

## 2019-11-06 MED ORDER — FUROSEMIDE 10 MG/ML IJ SOLN
80.0000 mg | Freq: Three times a day (TID) | INTRAMUSCULAR | Status: DC
  Administered 2019-11-06: 80 mg via INTRAVENOUS
  Filled 2019-11-06 (×2): qty 8

## 2019-11-06 MED ORDER — MIRABEGRON ER 50 MG PO TB24
50.0000 mg | ORAL_TABLET | Freq: Every day | ORAL | Status: DC
  Administered 2019-11-06 – 2019-11-13 (×8): 50 mg via ORAL
  Filled 2019-11-06 (×9): qty 1

## 2019-11-06 MED ORDER — VITAMIN D 25 MCG (1000 UNIT) PO TABS
2000.0000 [IU] | ORAL_TABLET | Freq: Every day | ORAL | Status: DC
  Administered 2019-11-06 – 2019-11-13 (×8): 2000 [IU] via ORAL
  Filled 2019-11-06 (×8): qty 2

## 2019-11-06 MED ORDER — NEBIVOLOL HCL 5 MG PO TABS: 5.0000 mg | ORAL_TABLET | Freq: Every day | ORAL | Status: DC

## 2019-11-06 MED ORDER — SODIUM CHLORIDE 0.9 % IV SOLN: 250.0000 mL | INTRAVENOUS | Status: DC | PRN

## 2019-11-06 MED ORDER — ACETAMINOPHEN 325 MG PO TABS
650.0000 mg | ORAL_TABLET | ORAL | Status: DC | PRN
  Administered 2019-11-07 – 2019-11-13 (×2): 650 mg via ORAL
  Filled 2019-11-06 (×2): qty 2

## 2019-11-06 MED ORDER — FINASTERIDE 5 MG PO TABS
5.0000 mg | ORAL_TABLET | Freq: Every day | ORAL | Status: DC
  Administered 2019-11-06 – 2019-11-13 (×8): 5 mg via ORAL
  Filled 2019-11-06 (×8): qty 1

## 2019-11-06 MED ORDER — SODIUM CHLORIDE 0.9 % IV SOLN
1.0000 g | Freq: Every day | INTRAVENOUS | Status: DC
  Administered 2019-11-07 (×2): 1 g via INTRAVENOUS
  Filled 2019-11-06: qty 0.02
  Filled 2019-11-06 (×2): qty 10

## 2019-11-06 MED ORDER — MONTELUKAST SODIUM 10 MG PO TABS
10.0000 mg | ORAL_TABLET | Freq: Every day | ORAL | Status: DC
  Administered 2019-11-07 – 2019-11-13 (×7): 10 mg via ORAL
  Filled 2019-11-06 (×8): qty 1

## 2019-11-06 MED ORDER — FERROUS SULFATE 325 (65 FE) MG PO TABS
325.0000 mg | ORAL_TABLET | Freq: Every day | ORAL | Status: DC
  Administered 2019-11-06 – 2019-11-08 (×3): 325 mg via ORAL
  Filled 2019-11-06 (×3): qty 1

## 2019-11-06 MED ORDER — SODIUM CHLORIDE 0.9% FLUSH: 3.0000 mL | INTRAVENOUS | Status: DC | PRN

## 2019-11-06 MED ORDER — ISOSORBIDE MONONITRATE ER 30 MG PO TB24: 30.0000 mg | ORAL_TABLET | Freq: Every day | ORAL | Status: DC

## 2019-11-06 MED ORDER — MEMANTINE HCL 10 MG PO TABS
10.0000 mg | ORAL_TABLET | Freq: Two times a day (BID) | ORAL | Status: DC
  Administered 2019-11-06 – 2019-11-13 (×14): 10 mg via ORAL
  Filled 2019-11-06 (×16): qty 1

## 2019-11-06 MED ORDER — FERROUS SULFATE 324 (65 FE) MG PO TBEC: 324.0000 mg | DELAYED_RELEASE_TABLET | Freq: Every day | ORAL | Status: DC

## 2019-11-06 MED ORDER — FUROSEMIDE 10 MG/ML IJ SOLN: 40.0000 mg | Freq: Two times a day (BID) | INTRAMUSCULAR | Status: DC

## 2019-11-06 MED ORDER — HYDRALAZINE HCL 25 MG PO TABS
25.0000 mg | ORAL_TABLET | Freq: Four times a day (QID) | ORAL | Status: DC
  Administered 2019-11-06: 25 mg via ORAL
  Filled 2019-11-06 (×2): qty 1

## 2019-11-06 MED ORDER — ALBUTEROL SULFATE (2.5 MG/3ML) 0.083% IN NEBU: 3.0000 mL | INHALATION_SOLUTION | Freq: Four times a day (QID) | RESPIRATORY_TRACT | Status: DC | PRN

## 2019-11-06 MED ORDER — DEXTROSE 50 % IV SOLN
50.0000 mL | Freq: Once | INTRAVENOUS | Status: AC
  Administered 2019-11-06: 50 mL via INTRAVENOUS
  Filled 2019-11-06: qty 50

## 2019-11-06 MED ORDER — BUDESONIDE 0.25 MG/2ML IN SUSP
0.2500 mg | Freq: Two times a day (BID) | RESPIRATORY_TRACT | Status: DC
  Administered 2019-11-07 – 2019-11-13 (×13): 0.25 mg via RESPIRATORY_TRACT
  Filled 2019-11-06 (×15): qty 2

## 2019-11-06 MED ORDER — PANTOPRAZOLE SODIUM 40 MG PO TBEC
40.0000 mg | DELAYED_RELEASE_TABLET | Freq: Two times a day (BID) | ORAL | Status: DC
  Administered 2019-11-06 – 2019-11-13 (×14): 40 mg via ORAL
  Filled 2019-11-06 (×14): qty 1

## 2019-11-06 MED ORDER — MELATONIN 5 MG PO TABS
5.0000 mg | ORAL_TABLET | Freq: Every day | ORAL | Status: DC
  Administered 2019-11-06 – 2019-11-12 (×7): 5 mg via ORAL
  Filled 2019-11-06 (×8): qty 1

## 2019-11-06 MED ORDER — SODIUM ZIRCONIUM CYCLOSILICATE 10 G PO PACK
10.0000 g | PACK | Freq: Two times a day (BID) | ORAL | Status: AC
  Administered 2019-11-06 (×2): 10 g via ORAL
  Filled 2019-11-06 (×2): qty 1

## 2019-11-06 MED ORDER — ESCITALOPRAM OXALATE 10 MG PO TABS
5.0000 mg | ORAL_TABLET | Freq: Every day | ORAL | Status: DC
  Administered 2019-11-06 – 2019-11-13 (×8): 5 mg via ORAL
  Filled 2019-11-06 (×8): qty 1

## 2019-11-06 MED ORDER — ATORVASTATIN CALCIUM 10 MG PO TABS: 20.0000 mg | ORAL_TABLET | Freq: Every day | ORAL | Status: DC

## 2019-11-06 MED ORDER — INSULIN ASPART 100 UNIT/ML IV SOLN
10.0000 [IU] | Freq: Once | INTRAVENOUS | Status: AC
  Administered 2019-11-06: 10 [IU] via INTRAVENOUS

## 2019-11-06 MED ORDER — CLOPIDOGREL BISULFATE 75 MG PO TABS: 75.0000 mg | ORAL_TABLET | Freq: Every day | ORAL | Status: DC

## 2019-11-06 MED ORDER — DONEPEZIL HCL 5 MG PO TABS
10.0000 mg | ORAL_TABLET | Freq: Every day | ORAL | Status: DC
  Administered 2019-11-06 – 2019-11-10 (×5): 10 mg via ORAL
  Filled 2019-11-06: qty 1
  Filled 2019-11-06 (×5): qty 2

## 2019-11-06 MED ORDER — LORATADINE 10 MG PO TABS
10.0000 mg | ORAL_TABLET | Freq: Every day | ORAL | Status: DC
  Administered 2019-11-07 – 2019-11-08 (×2): 10 mg via ORAL
  Filled 2019-11-06 (×3): qty 1

## 2019-11-06 MED ORDER — CALCIUM GLUCONATE-NACL 1-0.675 GM/50ML-% IV SOLN
1.0000 g | Freq: Once | INTRAVENOUS | Status: AC
  Administered 2019-11-06: 1000 mg via INTRAVENOUS
  Filled 2019-11-06: qty 50

## 2019-11-06 MED ORDER — FUROSEMIDE 10 MG/ML IJ SOLN
40.0000 mg | Freq: Once | INTRAMUSCULAR | Status: AC
  Administered 2019-11-06: 40 mg via INTRAVENOUS
  Filled 2019-11-06: qty 4

## 2019-11-06 MED ORDER — ASPIRIN EC 81 MG PO TBEC
81.0000 mg | DELAYED_RELEASE_TABLET | Freq: Every day | ORAL | Status: DC
  Administered 2019-11-06 – 2019-11-13 (×8): 81 mg via ORAL
  Filled 2019-11-06 (×8): qty 1

## 2019-11-06 MED ORDER — IPRATROPIUM-ALBUTEROL 0.5-2.5 (3) MG/3ML IN SOLN
3.0000 mL | Freq: Four times a day (QID) | RESPIRATORY_TRACT | Status: DC
  Administered 2019-11-07 – 2019-11-08 (×5): 3 mL via RESPIRATORY_TRACT
  Filled 2019-11-06 (×5): qty 3

## 2019-11-06 MED ORDER — INSULIN ASPART 100 UNIT/ML ~~LOC~~ SOLN
0.0000 [IU] | Freq: Three times a day (TID) | SUBCUTANEOUS | Status: DC
  Administered 2019-11-07: 1 [IU] via SUBCUTANEOUS
  Administered 2019-11-07 – 2019-11-08 (×3): 2 [IU] via SUBCUTANEOUS
  Administered 2019-11-08: 3 [IU] via SUBCUTANEOUS
  Administered 2019-11-09: 2 [IU] via SUBCUTANEOUS
  Administered 2019-11-09: 5 [IU] via SUBCUTANEOUS

## 2019-11-06 MED ORDER — SODIUM CHLORIDE 0.9% FLUSH
3.0000 mL | Freq: Two times a day (BID) | INTRAVENOUS | Status: DC
  Administered 2019-11-07 – 2019-11-13 (×13): 3 mL via INTRAVENOUS

## 2019-11-06 NOTE — Progress Notes (Signed)
Pt is bradycardia, will hold beta blocker for now.

## 2019-11-06 NOTE — ED Provider Notes (Signed)
[facility] EMERGENCY DEPARTMENT Provider Note   CSN: [id number] Arrival date & time: 11/06/19  1108     History Chief Complaint  Patient presents with  . Shortness of Breath    [name] is a 86 y.o. male.  Patient is an 86 year old male with past medical history of coronary artery disease with stent, COPD, diabetes, hypertension, and GERD.  He is brought by family for evaluation of shortness of breath.  According to the family, this has been worsening over the past week.  They also describe some wheezing as well as swelling of his legs and arms.  Patient denies to me he is experiencing any chest pain, fever, or productive cough.  The history is provided by the patient.  Shortness of Breath Severity:  Moderate Onset quality:  Gradual Duration:  5 days Timing:  Constant Progression:  Worsening Chronicity:  New Context: activity   Relieved by:  Nothing Worsened by:  Exertion Ineffective treatments:  None tried Associated symptoms: no abdominal pain, no fever and no sputum production        Past Medical History:  Diagnosis Date  . Anemia   . Asthma   . Benign prostatic hypertrophy   . Bilateral sensorineural hearing loss   . CAD (coronary artery disease) 07/2003   a.  3.0 x 28 mm Taxus DES in the mid RCA in 2005  . COPD (chronic obstructive pulmonary disease) ([place])   . Diverticulosis   . DJD (degenerative joint disease)   . DM (diabetes mellitus) ([place])   . Dyslipidemia   . GERD (gastroesophageal reflux disease)   . Hemorrhoids   . Hiatal hernia   . Hypertension   . OSA (obstructive sleep apnea)    cpap use  . Osteoarthritis   . Overactive bladder   . Rhinitis   . Seasonal allergies   . Shingles   . Stroke ([place])   . TIA (transient ischemic attack) 12/2006   Dr. Leonie Man    Patient Active Problem List   Diagnosis Date Noted  . Fall 05/07/2017  . Alzheimer's dementia without behavioral disturbance ([place]) 10/10/2015  . Dementia ([place])  04/22/2015  . Alzheimer's disease ([place]) 04/22/2015  . Community acquired pneumonia 02/24/2013    Class: Acute  . Iatrogenic adrenal insufficiency ([facility]) 02/24/2013    Class: Chronic  . Type II or unspecified type diabetes mellitus with neurological manifestations, not stated as uncontrolled(250.60) 01/04/2013    Class: Chronic  . Coronary artery disease 01/03/2012  . OSA (obstructive sleep apnea) 03/13/2007  . COPD (chronic obstructive pulmonary disease) ([place]) 03/13/2007    Past Surgical History:  Procedure Laterality Date  . BACK SURGERY     lower back  . Cataract surgery     1 eye done.  . CORONARY ANGIOPLASTY WITH STENT PLACEMENT  07/2003   Est. EF of 65% with stent to the RCA, by Dr. Acie Fredrickson  . CYSTOSCOPY WITH INSERTION OF UROLIFT N/A 02/25/2016   Procedure: CYSTOSCOPY WITH INSERTION OF UROLIFT x6;  Surgeon: Cleon Gustin, MD;  Location: [facility];  Service: Urology;  Laterality: N/A;  1 HOUR    . INNER EAR SURGERY    . JOINT REPLACEMENT Right   . L5 laminectomy  2004   Dr. Sherwood Gambler  . right total knee  2006   Dr. Maureen Ralphs       Family History  Problem Relation Age of Onset  . Lung disease Father   . Asthma Father   . Sudden death Mother  unknown causes  . Lung disease Brother   . Asthma Brother   . Sudden death Brother 62       unknown causes  . Sudden death Sister        unknown causes  . Sudden death Sister        unknown causes    Social History   Tobacco Use  . Smoking status: Former Smoker    Packs/day: 1.00    Years: 43.00    Pack years: 43.00    Types: Cigarettes    Quit date: 05/02/1988    Years since quitting: 31.5  . Smokeless tobacco: Never Used  Substance Use Topics  . Alcohol use: Yes    Comment: RARE-social  . Drug use: No    Home Medications Prior to Admission medications   Medication Sig Start Date End Date Taking? Authorizing Provider  albuterol (PROVENTIL HFA;VENTOLIN HFA) 108 (90 Base) MCG/ACT inhaler Inhale 2 puffs into  the lungs every 6 (six) hours as needed for wheezing or shortness of breath.    [provider]  albuterol (PROVENTIL) (2.5 MG/3ML) 0.083% nebulizer solution Take 0.83 mg by nebulization 2 (two) times daily.     [provider]  amLODipine (NORVASC) 5 MG tablet Take 5 mg by mouth daily.     [provider]  amoxicillin (AMOXIL) 500 MG capsule Take 2,000 mg by mouth See admin instructions. Take 4 capsules (2000 mg) by mouth one hour prior to dental appointment (last appointment November 2018) 03/10/17   [provider]  aspirin EC 81 MG tablet Take 81 mg by mouth daily with supper.    [provider]  atorvastatin (LIPITOR) 20 MG tablet Take 20 mg by mouth daily.     [provider]  cefUROXime (CEFTIN) 500 MG tablet Take 1 tablet (500 mg total) by mouth 2 (two) times daily with a meal. 05/10/17   Geradine Girt, DO  Cholecalciferol (VITAMIN D) 2000 units CAPS Take 2,000 Units by mouth daily with supper.     [provider]  clopidogrel (PLAVIX) 75 MG tablet Take 1 tablet (75 mg total) by mouth daily. 02/29/16   McKenzie, Candee Furbish, MD  donepezil (ARICEPT) 10 MG tablet Take 10 mg by mouth daily with supper. 04/08/17   [provider]  escitalopram (LEXAPRO) 5 MG tablet Take 5 mg by mouth daily with supper. 02/13/17   [provider]  ferrous sulfate 324 (65 Fe) MG TBEC Take 324 mg by mouth daily with supper.    [provider]  finasteride (PROSCAR) 5 MG tablet Take 5 mg by mouth daily with supper.  04/16/15   [provider]  Fluticasone-Umeclidin-Vilant (TRELEGY ELLIPTA) 100-62.5-25 MCG/INH AEPB Inhale 1 puff into the lungs daily.    [provider]  insulin lispro (HUMALOG KWIKPEN) 100 UNIT/ML KiwkPen Inject 4 Units into the skin See admin instructions. Inject 4 units subcutaneously twice daily - after lunch and before supper    [provider]  loratadine (CLARITIN) 10 MG tablet Take 10  mg by mouth daily.     [provider]  Melatonin 5 MG TABS Take 5 mg by mouth at bedtime.    [provider]  memantine (NAMENDA) 10 MG tablet Take 10 mg by mouth 2 (two) times daily. 03/18/17   [provider]  mirabegron ER (MYRBETRIQ) 50 MG TB24 tablet Take 50 mg by mouth daily with supper.    [provider]  montelukast (SINGULAIR) 10 MG tablet  Take 10 mg by mouth daily.     [provider]  Multiple Vitamin (MULTIVITAMIN WITH MINERALS) TABS tablet Take 1 tablet by mouth daily.    [provider]  nebivolol (BYSTOLIC) 5 MG tablet Take 5 mg by mouth daily.    [provider]  nitroGLYCERIN (NITROSTAT) 0.4 MG SL tablet Place 0.4 mg under the tongue every 5 (five) minutes as needed for chest pain.     [provider]  pantoprazole (PROTONIX) 40 MG tablet Take 40 mg by mouth 2 (two) times daily.     [provider]  predniSONE (DELTASONE) 5 MG tablet Take 5 mg by mouth daily with breakfast.     [provider]  sitaGLIPtin-metformin (JANUMET) 50-1000 MG tablet Take 1 tablet by mouth 2 (two) times daily with a meal.      [provider]  telmisartan (MICARDIS) 80 MG tablet Take 80 mg by mouth daily with supper.     [provider]  vitamin C (ASCORBIC ACID) 500 MG tablet Take 500 mg by mouth daily with supper.     [provider]    Allergies    Ciprofloxacin hcl, Mirabegron, Penicillins, Sulfa antibiotics, Sulfamethoxazole, and Sulfonamide derivatives  Review of Systems   Review of Systems  Constitutional: Negative for fever.  Respiratory: Positive for shortness of breath. Negative for sputum production.   Gastrointestinal: Negative for abdominal pain.  All other systems reviewed and are negative.   Physical Exam Updated Vital Signs BP (!) 170/73   Pulse (!) 45   Temp (!) 97.4 F (36.3 C) (Oral)   Resp 12   Ht 5\' 7"  (1.702 m)   Wt 75 kg   SpO2 99%   BMI 25.90  kg/m   Physical Exam Vitals and nursing note reviewed.  Constitutional:      General: He is not in acute distress.    Appearance: He is well-developed. He is not diaphoretic.  HENT:     Head: Normocephalic and atraumatic.  Cardiovascular:     Rate and Rhythm: Normal rate and regular rhythm.     Heart sounds: No murmur heard.  No friction rub.  Pulmonary:     Effort: Pulmonary effort is normal. No respiratory distress.     Breath sounds: Examination of the right-middle field reveals rhonchi. Examination of the left-middle field reveals rhonchi. Rhonchi present. No wheezing or rales.  Abdominal:     General: Bowel sounds are normal. There is no distension.     Palpations: Abdomen is soft.     Tenderness: There is no abdominal tenderness.  Musculoskeletal:        General: Normal range of motion.     Cervical back: Normal range of motion and neck supple.     Right lower leg: Edema present.     Left lower leg: Edema present.     Comments: There is 2+ pitting edema of both lower extremities and arms.  Skin:    General: Skin is warm and dry.  Neurological:     Mental Status: He is alert and oriented to person, place, and time.     Coordination: Coordination normal.     ED Results / Procedures / Treatments   Labs (all labs ordered are listed, but only abnormal results are displayed) Labs Reviewed  BASIC METABOLIC PANEL - Abnormal; Notable for the following components:      Result Value   Potassium 6.2 (*)    CO2 19 (*)    Glucose,  Bld 190 (*)    BUN 40 (*)    Creatinine, Ser 2.32 (*)    GFR calc non Af Amer 24 (*)    GFR calc Af Amer 28 (*)    All other components within normal limits  CBC - Abnormal; Notable for the following components:   WBC 14.5 (*)    RBC 3.62 (*)    Hemoglobin 9.0 (*)    HCT 28.9 (*)    MCV 79.8 (*)    MCH 24.9 (*)    RDW 17.7 (*)    All other components within normal limits  LACTIC ACID, PLASMA - Abnormal; Notable for the following components:    Lactic Acid, Venous 3.5 (*)    All other components within normal limits  BRAIN NATRIURETIC PEPTIDE - Abnormal; Notable for the following components:   B Natriuretic Peptide 477.3 (*)    All other components within normal limits  SARS CORONAVIRUS 2 BY RT PCR (HOSPITAL ORDER, [facility])  LACTIC ACID, PLASMA  URINALYSIS, ROUTINE W REFLEX MICROSCOPIC  POTASSIUM  TROPONIN I (HIGH SENSITIVITY)  TROPONIN I (HIGH SENSITIVITY)    EKG EKG Interpretation  Date/Time:  Wednesday November 06 2019 11:11:41 EDT Ventricular Rate:  49 PR Interval:    QRS Duration: 86 QT Interval:  458 QTC Calculation: 413 R Axis:   35 Text Interpretation: Sinus rhythm Abnormal ECG No significant change since 05/07/2017 Confirmed by Veryl Speak [id number]) on 11/06/2019 2:02:04 PM   Radiology DG Chest 2 View  Result Date: 11/06/2019 CLINICAL DATA:  Onset shortness of breath this morning. EXAM: CHEST - 2 VIEW COMPARISON:  Single-view of the chest 05/07/2017. FINDINGS: There is cardiomegaly and interstitial pulmonary edema with small bilateral pleural effusions. No pneumothorax. IMPRESSION: Cardiomegaly and interstitial edema. Electronically Signed   By: Inge Rise M.D.   On: 11/06/2019 12:15    Procedures Procedures (including critical care time)  Medications Ordered in ED Medications  furosemide (LASIX) injection 40 mg (has no administration in time range)  calcium gluconate 1 g/ 50 mL sodium chloride IVPB (1,000 mg Intravenous New Bag/Given 11/06/19 1427)  insulin aspart (novoLOG) injection 10 Units (10 Units Intravenous Given 11/06/19 1425)  dextrose 50 % solution 50 mL (50 mLs Intravenous Given 11/06/19 1423)    ED Course  I have reviewed the triage vital signs and the nursing notes.  Pertinent labs & imaging results that were available during my care of the patient were reviewed by me and considered in my medical decision making (see chart for details).    MDM  Rules/Calculators/A&P  Patient presenting with complaints of worsening breathing over the past 5 or 6 days.  Work-up initiated in triage including laboratory studies, chest x-ray, and EKG.  His EKG shows a sinus bradycardia with no significant changes.  His BNP is elevated and chest x-ray is consistent with interstitial edema.  He also has the laboratory abnormalities of worsening renal function with creatinine of 2.3 and potassium of 6.2.  I question the laboratory as to whether or not there was hemolysis and they state there was none.  This value will be repeated, and patient will be given calcium gluconate and insulin and glucose.  IV access was established and IV Lasix was administered.  Patient will be admitted to the hospitalist service for further treatment and care.  CRITICAL CARE Performed by: Veryl Speak Total critical care time: 45 minutes Critical care time was exclusive of separately billable procedures and treating other patients. Critical  care was necessary to treat or prevent imminent or life-threatening deterioration. Critical care was time spent personally by me on the following activities: development of treatment plan with patient and/or surrogate as well as nursing, discussions with consultants, evaluation of patient's response to treatment, examination of patient, obtaining history from patient or surrogate, ordering and performing treatments and interventions, ordering and review of laboratory studies, ordering and review of radiographic studies, pulse oximetry and re-evaluation of patient's condition.   Final Clinical Impression(s) / ED Diagnoses Final diagnoses:  None    Rx / DC Orders ED Discharge Orders    None       Veryl Speak, MD 11/06/19 1443

## 2019-11-06 NOTE — ED Triage Notes (Addendum)
Pt BIB [facility] for eval of respiratory distress and SOB. Pt reports acte onset SOB onset this AM. Wheezing in all fields on EMS arrival, rec'd 5mg  albuterol by EMS and SOB/wheezing resolved afterwards. Pt denies CP. Arrives in [place]. Family also reports progressively worsening generalized edema w/ no hx of heart failure. States that pt has been having increased generalized weakness x 2 weeks, but SOB episode this AM prompted them to call EMS.

## 2019-11-06 NOTE — ED Notes (Signed)
Condom cath placed.

## 2019-11-06 NOTE — Consult Note (Signed)
CARDIOLOGY CONSULT NOTE  Patient ID: [name] MRN: [id number] DOB/AGE: 03-Aug-1928 86 y.o.  Admit date: 11/06/2019 Referring Physician  Wynetta Fines,, MD Primary Physician:  Leanna Battles, MD Reason for Consultation  CHF  Patient ID: [name], male    DOB: May 05, 1928, 86 y.o.   MRN: [id number]  Chief Complaint  Patient presents with  . Shortness of Breath   HPI:    [name]  is a 86 y.o. Asian [place] male with history of known coronary artery disease with remote stenting, prior tobacco use disorder and COPD, diabetes mellitus, hypertension, obstructive sleep apnea on CPAP now admitted to the hospital with worsening leg edema and arm edema, worsening dyspnea and worsening mental status.  Over the past 1 week he has deteriorated significantly with regard to overall wellbeing, usually uses albuterol MDI maybe once a day but yesterday had to use it 3 times.  He has not had any chest pain, no fever, no cough.  Family also has noticed that he has been more lethargic than usual.  Past Medical History:  Diagnosis Date  . Anemia   . Asthma   . Benign prostatic hypertrophy   . Bilateral sensorineural hearing loss   . CAD (coronary artery disease) 07/2003   a.  3.0 x 28 mm Taxus DES in the mid RCA in 2005  . COPD (chronic obstructive pulmonary disease) ([place])   . Diverticulosis   . DJD (degenerative joint disease)   . DM (diabetes mellitus) ([place])   . Dyslipidemia   . GERD (gastroesophageal reflux disease)   . Hemorrhoids   . Hiatal hernia   . Hypertension   . OSA (obstructive sleep apnea)    cpap use  . Osteoarthritis   . Overactive bladder   . Rhinitis   . Seasonal allergies   . Shingles   . Stroke ([place])   . TIA (transient ischemic attack) 12/2006   Dr. Leonie Man   Past Surgical History:  Procedure Laterality Date  . BACK SURGERY     lower back  . Cataract surgery     1 eye done.  . CORONARY ANGIOPLASTY WITH STENT PLACEMENT  07/2003   Est. EF of 65% with stent to  the RCA, by Dr. Acie Fredrickson  . CYSTOSCOPY WITH INSERTION OF UROLIFT N/A 02/25/2016   Procedure: CYSTOSCOPY WITH INSERTION OF UROLIFT x6;  Surgeon: Cleon Gustin, MD;  Location: [facility];  Service: Urology;  Laterality: N/A;  1 HOUR    . INNER EAR SURGERY    . JOINT REPLACEMENT Right   . L5 laminectomy  2004   Dr. Sherwood Gambler  . right total knee  2006   Dr. Maureen Ralphs   Social History   Socioeconomic History  . Marital status: Married    Spouse name: Not on file  . Number of children: 4  . Years of education: 16+  . Highest education level: Not on file  Occupational History  . Occupation: retired professor  Tobacco Use  . Smoking status: Former Smoker    Packs/day: 1.00    Years: 43.00    Pack years: 43.00    Types: Cigarettes    Quit date: 05/02/1988    Years since quitting: 31.5  . Smokeless tobacco: Never Used  Substance and Sexual Activity  . Alcohol use: Yes    Comment: RARE-social  . Drug use: No  . Sexual activity: Not Currently  Other Topics Concern  . Not on file  Social History Narrative   Lives w/  wife   Caffeine use:      Social Determinants of Radio broadcast assistant Strain:   . Difficulty of Paying Living Expenses:   Food Insecurity:   . Worried About Charity fundraiser in the Last Year:   . Arboriculturist in the Last Year:   Transportation Needs:   . Film/video editor (Medical):   Marland Kitchen Lack of Transportation (Non-Medical):   Physical Activity:   . Days of Exercise per Week:   . Minutes of Exercise per Session:   Stress:   . Feeling of Stress :   Social Connections:   . Frequency of Communication with Friends and Family:   . Frequency of Social Gatherings with Friends and Family:   . Attends Religious Services:   . Active Member of Clubs or Organizations:   . Attends Archivist Meetings:   Marland Kitchen Marital Status:   Intimate Partner Violence:   . Fear of Current or Ex-Partner:   . Emotionally Abused:   Marland Kitchen Physically Abused:   . Sexually  Abused:    ROS  Review of Systems  Constitutional: Positive for malaise/fatigue and weight gain.  Cardiovascular: Positive for dyspnea on exertion, leg swelling and orthopnea.  Neurological: Positive for loss of balance and weakness.  Psychiatric/Behavioral: Positive for altered mental status.  All other systems reviewed and are negative.  Objective   Vitals with BMI 11/06/2019 11/06/2019 11/06/2019  Height - 5\' 7"  -  Weight - 165 lbs 6 oz -  BMI - 11.94 -  Systolic 174 - 081  Diastolic 73 - 70  Pulse 45 - 50    Blood pressure (!) 170/73, pulse (!) 45, temperature (!) 97.4 F (36.3 C), temperature source Oral, resp. rate 12, height 5\' 7"  (1.702 m), weight 75 kg, SpO2 99 %.    Physical Exam Constitutional:      General: He is not in acute distress.    Appearance: He is ill-appearing. He is not diaphoretic.  Neck:     Vascular: JVD present.  Cardiovascular:     Rate and Rhythm: Regular rhythm. Bradycardia present.     Comments: Distant heart sounds. JVD elevated up to the jaw. 2 + bilateral pitting pedal edema Pulmonary:     Effort: Tachypnea and respiratory distress (mild) present.     Breath sounds: Rales (bilateral extensive rales and wheezing) present.  Abdominal:     General: Bowel sounds are normal.     Palpations: Abdomen is soft.  Skin:    Capillary Refill: Capillary refill takes less than 2 seconds.    Laboratory examination:   Recent Labs    11/06/19 1126 11/06/19 1420  NA 137  --   K 6.2* 6.4*  CL 106  --   CO2 19*  --   GLUCOSE 190*  --   BUN 40*  --   CREATININE 2.32*  --   CALCIUM 9.0  --   GFRNONAA 24*  --   GFRAA 28*  --    estimated creatinine clearance is 20.6 mL/min (A) (by C-G formula based on SCr of 2.32 mg/dL (H)).  CMP Latest Ref Rng & Units 11/06/2019 11/06/2019 05/09/2017  Glucose 70 - 99 mg/dL - 190(H) 124(H)  BUN 8 - 23 mg/dL - 40(H) 22(H)  Creatinine 0.61 - 1.24 mg/dL - 2.32(H) 1.17  Sodium 135 - 145 mmol/L - 137 142  Potassium 3.5 -  5.1 mmol/L 6.4(HH) 6.2(H) 4.0  Chloride 98 - 111 mmol/L - 106 104  CO2 22 - 32 mmol/L - 19(L) 23  Calcium 8.9 - 10.3 mg/dL - 9.0 8.9  Total Protein 6.5 - 8.1 g/dL - - -  Total Bilirubin 0.3 - 1.2 mg/dL - - -  Alkaline Phos 38 - 126 U/L - - -  AST 15 - 41 U/L - - -  ALT 17 - 63 U/L - - -   CBC Latest Ref Rng & Units 11/06/2019 05/09/2017 05/08/2017  WBC 4.0 - 10.5 K/uL 14.5(H) 11.2(H) 14.4(H)  Hemoglobin 13.0 - 17.0 g/dL 9.0(L) 11.8(L) 11.7(L)  Hematocrit 39 - 52 % 28.9(L) 36.4(L) 37.2(L)  Platelets 150 - 400 K/uL 214 174 183   Lipid Panel     Component Value Date/Time   CHOL 109 01/03/2012 1305   TRIG 44.0 01/03/2012 1305   HDL 60.20 01/03/2012 1305   CHOLHDL 2 01/03/2012 1305   VLDL 8.8 01/03/2012 1305   LDLCALC 40 01/03/2012 1305   HEMOGLOBIN A1C Lab Results  Component Value Date   HGBA1C 8.3 (H) 02/23/2016   MPG 192 02/23/2016   TSH No results for input(s): TSH in the last 8760 hours. BNP (last 3 results) Recent Labs    11/06/19 1126  BNP 477.3*    Ref Range & Units 14:20 11/06/19  11:26  Troponin I (High Sensitivity) <18 ng/L 12  11 CM          ABG    Component Value Date/Time   PHART 7.369 11/06/2019 1833   PCO2ART 38.4 11/06/2019 1833   PO2ART 78 (L) 11/06/2019 1833   HCO3 22.1 11/06/2019 1833   TCO2 23 11/06/2019 1833   ACIDBASEDEF 3.0 (H) 11/06/2019 1833   O2SAT 95.0 11/06/2019 1833    Medications and allergies   Allergies  Allergen Reactions  . Ciprofloxacin Hcl Other (See Comments)    Unknown reaction  . Mirabegron Other (See Comments)    FATIGUE  . Penicillins     Has patient had a PCN reaction causing immediate rash, facial/tongue/throat swelling, SOB or lightheadedness with hypotension: Unknown Has patient had a PCN reaction causing severe rash involving mucus membranes or skin necrosis: Unknown Has patient had a PCN reaction that required hospitalization Unknown Has patient had a PCN reaction occurring within the last 10 years: Unknown If  all of the above answers are "NO", then may proceed with Cephalosporin use.   . Sulfa Antibiotics Hives and Rash  . Sulfamethoxazole Rash  . Sulfonamide Derivatives Rash    Current Outpatient Medications  Medication Instructions  . albuterol (PROVENTIL HFA;VENTOLIN HFA) 108 (90 Base) MCG/ACT inhaler 2 puffs, Inhalation, Every 6 hours PRN  . albuterol (PROVENTIL) 2.5 mg, Nebulization, 2 times daily  . amLODipine (NORVASC) 5 mg, Oral, Daily  . amoxicillin (AMOXIL) 2,000 mg, Oral, See admin instructions, Take 4 capsules (2000 mg) by mouth one hour prior to dental appointment (last appointment November 2018)  . aspirin EC 81 mg, Oral, Daily with supper  . atorvastatin (LIPITOR) 20 mg, Oral, Daily  . brimonidine (ALPHAGAN) 0.15 % ophthalmic solution 1 drop, Left Eye, 2 times daily  . cefUROXime (CEFTIN) 500 mg, Oral, 2 times daily with meals  . clopidogrel (PLAVIX) 75 mg, Oral, Daily  . donepezil (ARICEPT) 10 mg, Oral, Daily with supper  . escitalopram (LEXAPRO) 5 mg, Oral, Daily with supper  . ferrous sulfate 324 mg, Daily with supper  . finasteride (PROSCAR) 5 mg, Oral, Daily with supper  . Fluticasone-Umeclidin-Vilant (TRELEGY ELLIPTA) 100-62.5-25 MCG/INH AEPB 1 puff, Inhalation, Daily  . insulin lispro (HUMALOG KWIKPEN)  2-5 Units, Subcutaneous, See admin instructions, 2-5 units per sliding scale  . iron polysaccharides (NIFEREX) 150 mg, Oral, Every other day  . latanoprost (XALATAN) 0.005 % ophthalmic solution 1 drop, Both Eyes, Daily at bedtime  . loratadine (CLARITIN) 10 mg, Oral, Daily  . melatonin 5 mg, Daily at bedtime  . memantine (NAMENDA) 10 mg, Oral, 2 times daily  . mirabegron ER (MYRBETRIQ) 50 mg, Oral, Daily with supper  . montelukast (SINGULAIR) 10 mg, Oral, Daily  . Multiple Vitamin (MULTIVITAMIN WITH MINERALS) TABS tablet 1 tablet, Oral, Daily  . nebivolol (BYSTOLIC) 5 mg, Oral, Daily  . nitroGLYCERIN (NITROSTAT) 0.4 mg, Sublingual, Every 5 min PRN  . pantoprazole  (PROTONIX) 40 mg, 2 times daily  . predniSONE (DELTASONE) 7.5 mg, Oral, Daily with breakfast  . sitaGLIPtin-metformin (JANUMET) 50-1000 MG tablet 1 tablet, 2 times daily with meals  . telmisartan (MICARDIS) 80 mg, Oral, Daily with supper  . vitamin C (ASCORBIC ACID) 500 mg, Oral, Daily with supper  . Vitamin D 2,000 Units, Oral, Daily with supper    No intake/output data recorded. Total I/O In: 16 [IV Piggyback:50] Out: -    Radiology:   Imaging: DG Chest 2 View  Result Date: 11/06/2019 CLINICAL DATA:  Onset shortness of breath this morning. EXAM: CHEST - 2 VIEW COMPARISON:  Single-view of the chest 05/07/2017. FINDINGS: There is cardiomegaly and interstitial pulmonary edema with small bilateral pleural effusions. No pneumothorax. IMPRESSION: Cardiomegaly and interstitial edema. Electronically Signed   By: Inge Rise M.D.   On: 11/06/2019 12:15   US RENAL  Result Date: 11/06/2019 CLINICAL DATA:  Acute kidney injury. EXAM: RENAL / URINARY TRACT ULTRASOUND COMPLETE COMPARISON:  None. FINDINGS: Right Kidney: Renal measurements: 10.7 x 5.2 x 5.1 cm = volume: 150 mL. Increased echogenicity of renal parenchyma is noted suggesting medical renal disease. 3.8 cm complex abnormality is seen involving the lower pole which may represent neoplasm. 7 mm simple cyst is seen in midpole. No hydronephrosis visualized. Left Kidney: Renal measurements: 10.7 x 5.7 x 4.7 cm = volume: 150 mL. Increased echogenicity of renal parenchyma is noted suggesting medical renal disease. No mass or hydronephrosis visualized. Bladder: Appears normal for degree of bladder distention. Other: None. IMPRESSION: Increased echogenicity of renal parenchyma is noted bilaterally suggesting medical renal disease. No hydronephrosis or renal obstruction is noted. 3.8 cm complex rounded abnormality is seen involving the lower pole of the right kidney which may represent complex cyst, but possible neoplasm cannot be excluded. Further  evaluation with CT or MRI is recommended. Electronically Signed   By: Marijo Conception M.D.   On: 11/06/2019 16:30    Cardiac Studies:   Echocardiogram 05/08/2017: - Left ventricle: There was mild concentric hypertrophy. Systolic  function was normal. The estimated ejection fraction was in the  range of 55% to 60%. Wall motion was normal; there were no  regional wall motion abnormalities. Doppler parameters are  consistent with abnormal left ventricular relaxation (grade 1 astolic dysfunction). Doppler parameters are consistent with  both elevated ventricular end-diastolic filling pressure and  elevated left atrial filling pressure.  - Mitral valve: Calcified annulus.  - Left atrium: The atrium was mildly dilated.  - Atrial septum: No defect or patent foramen ovale was identified.  EKG:  EKG 11/06/2019 at 11: 11 hours Marked sinus bradycardia at rate of 49 bpm, normal axis, normal QT interval.  No evident ischemia.  Assessment   1. Acute diastolic heart failure with pulmonary edema. Trop negative x 2. 2.  COPD exacervation with bilateral extensive wheezing.  3. New anemia 4. Acute on chronic kidney failure now stage 4.  5. Hyperkalemia  Recommendations:   [name]  is a 86 y.o. Asian [place] male with history of known coronary artery disease with remote stenting, prior tobacco use disorder and COPD, diabetes mellitus, hypertension, obstructive sleep apnea on CPAP now admitted to the hospital with worsening leg edema and arm edema, worsening dyspnea and worsening mental status.  Patient also appears to be in mild respiratory distress and also in metabolic acidosis and new anemia along with acute renal failure.  Will increase Lasix to 80 mg 3 times daily for now, cycle cardiac markers, echocardiogram.  Follow-up on BMP.  Will hold off on any ACE inhibitor's or ARB due to acute renal failure.  I have placed a consultation for pulmonary medicine to see him and discussed with  Dr. Roosevelt Locks. Will order respiratory to supply CPAP tonight.  Change to step-down admit in view of multiple medical issues and multiorgan involvement.   We will discontinue Plavix for now and also hold off on statins for now, do not suspect ACS.  EKG reveals marked sinus bradycardia probably related to hyperkalemia, also patient was on Bystolic at home and may need to be watched out.  TSH is normal.  Fortunately blood pressure is stable.  Abnormal ultrasound and renal mass noted and patient scheduled for CT scan.  We will continue to follow along. Daily weights and Is and Os. I d/w Dr. Berneta Sages, his son, patient to be made DNR but aggressive medical management only.    Adrian Prows, MD, [facility] 11/06/2019, 6:24 PM Office: [phone]

## 2019-11-06 NOTE — Consult Note (Addendum)
Robert [name] [name] MRN:  [id number] DOB:  [birth year] LOS: 0 ADMISSION DATE:  11/06/2019  CONSULTATION DATE:  11/06/2019 REFERRING MD:  Dr. Wynetta Fines  REASON FOR CONSULTATION:  Shortness of breath   Initial Pulmonary/Critical Care Consultation  Brief History   N/A  History of present illness   This 86 y.o. obese, Asian [place] male reformed smoker is seen in consultation at the request of Dr. Wynetta Fines for recommendations on further evaluation and management of shortness of breath.  He presented to the [facility] Emergency Department via EMS with complaints of dyspnea and respiratory distress.  The patient is quite hard of hearing.  The patient's daughter is at bedside and able to provide additional clinical information.  He apparently presented with increased work of breathing and wheezing, which improved with albuterol  He has COPD and is supposed to use Trilogy at home.  He also has a CPAP for OSA but admits to nonadherence to prescribed therapy.  He apparently reported increasing edema and generalized weakness.  He has also been experiencing increased mental "fogginess" (he has baseline dementia) and has been noted to be spending many more hours sleeping through the day than normal.  At the time of clinical interview, the patient is awake, alert and cooperative.  Hard of hearing.  Oriented to person and place.  Denies pain or discomfort.  In the ER, he has been getting furosemide in the setting of increased edema and abnormal serum creatinine on labs.  He also has been given hydralazine and Imdur for blood pressure control.  REVIEW OF SYSTEMS  Constitutional: No weight loss. No night sweats. No fever. No chills. Fatigue.  Generalized weakness. HEENT: Hard of hearing (deaf in the right ear, hard of hearing in the left ear).  Auditory hallucination (baby crying).  No headaches, dysphagia, sore throat, otalgia, nasal congestion, PND CV:  No chest pain, orthopnea, PND,  swelling in lower extremities, palpitations GI:  No abdominal pain, nausea, vomiting, diarrhea, change in bowel pattern, anorexia Resp: Dyspnea.  Wheezing.  No cough, mucus, hemoptysis.  GU: no dysuria, change in color of urine, no urgency or frequency.  No flank pain. MS:  No joint pain or swelling. No myalgias,  No decreased range of motion.  Psych:  No change in mood or affect.  "Moderate dementia" (per daughter) at baseline. Skin: no rash or lesions.   Past Medical/Surgical/Social/Family History   Past Medical History:  Diagnosis Date  . Anemia   . Asthma   . Benign prostatic hypertrophy   . Bilateral sensorineural hearing loss   . CAD (coronary artery disease) 07/2003   a.  3.0 x 28 mm Taxus DES in the mid RCA in 2005  . COPD (chronic obstructive pulmonary disease) ([place])   . Diverticulosis   . DJD (degenerative joint disease)   . DM (diabetes mellitus) ([place])   . Dyslipidemia   . GERD (gastroesophageal reflux disease)   . Hemorrhoids   . Hiatal hernia   . Hypertension   . OSA (obstructive sleep apnea)    cpap use  . Osteoarthritis   . Overactive bladder   . Rhinitis   . Seasonal allergies   . Shingles   . Stroke ([place])   . TIA (transient ischemic attack) 12/2006   Dr. Leonie Man    Past Surgical History:  Procedure Laterality Date  . BACK SURGERY     lower back  . Cataract surgery     1 eye done.  Marland Kitchen  CORONARY ANGIOPLASTY WITH STENT PLACEMENT  07/2003   Est. EF of 65% with stent to the RCA, by Dr. Acie Fredrickson  . CYSTOSCOPY WITH INSERTION OF UROLIFT N/A 02/25/2016   Procedure: CYSTOSCOPY WITH INSERTION OF UROLIFT x6;  Surgeon: Cleon Gustin, MD;  Location: [facility] ORS;  Service: Urology;  Laterality: N/A;  1 HOUR    . INNER EAR SURGERY    . JOINT REPLACEMENT Right   . L5 laminectomy  2004   Dr. Sherwood Gambler  . right total knee  2006   Dr. Maureen Ralphs    Social History   Tobacco Use  . Smoking status: Former Smoker    Packs/day: 1.00    Years: 43.00    Pack years: 43.00     Types: Cigarettes    Quit date: 05/02/1988    Years since quitting: 31.5  . Smokeless tobacco: Never Used  Substance Use Topics  . Alcohol use: Yes    Comment: RARE-social    Family History  Problem Relation Age of Onset  . Lung disease Father   . Asthma Father   . Sudden death Mother        unknown causes  . Lung disease Brother   . Asthma Brother   . Sudden death Brother 16       unknown causes  . Sudden death Sister        unknown causes  . Sudden death Sister        unknown causes     Significant Hospital Events   N/A   Consults:  PCCM Cardiology   Procedures:  N/A   Significant Diagnostic Tests:  CT abdomen/pelvis (7/7) shows 4.8 cm 4.3 cm isodense soft tissue mass in the right kidney; large gastric hernia; colonic diverticulosis; fat-containing periumbilical hernia; prostate radiation implantation seeds in a mildly enlarged prostate gland; severe, multilevel, degenerative changes throughout the lumbar spine; very small right pleural effusion; aortic atherosclerosis.  Renal ultrasound (7/7) shows increased echogenicity of renal parenchyma bilaterally.  No hydronephrosis.  No renal obstruction.  Complex rounded abnormality (3.8 cm) involving the lower pole of the right kidney.  Chest x-ray (7/7) shows increased interstitial markings and small, bilateral pleural effusions.   Micro Data:   Results for orders placed or performed during the hospital encounter of 11/06/19  SARS Coronavirus 2 by RT PCR (hospital order, performed in [facility] hospital lab) Nasopharyngeal Nasopharyngeal Swab     Status: None   Collection Time: 11/06/19  2:34 PM   Specimen: Nasopharyngeal Swab  Result Value Ref Range Status   SARS Coronavirus 2 NEGATIVE NEGATIVE Final    Comment: (NOTE) SARS-CoV-2 target nucleic acids are NOT DETECTED.  The SARS-CoV-2 RNA is generally detectable in upper and lower respiratory specimens during the acute phase of infection. The  lowest concentration of SARS-CoV-2 viral copies this assay can detect is 250 copies / mL. A negative result does not preclude SARS-CoV-2 infection and should not be used as the sole basis for treatment or other patient management decisions.  A negative result may occur with improper specimen collection / handling, submission of specimen other than nasopharyngeal swab, presence of viral mutation(s) within the areas targeted by this assay, and inadequate number of viral copies (<250 copies / mL). A negative result must be combined with clinical observations, patient history, and epidemiological information.  Fact Sheet for Patients:   StrictlyIdeas.no  Fact Sheet for Healthcare Providers: BankingDealers.co.za  This test is not yet approved or  cleared by the [place] FDA and  has been authorized for detection and/or diagnosis of SARS-CoV-2 by FDA under an Emergency Use Authorization (EUA).  This EUA will remain in effect (meaning this test can be used) for the duration of the COVID-19 declaration under Section 564(b)(1) of the Act, 21 U.S.C. section 360bbb-3(b)(1), unless the authorization is terminated or revoked sooner.  Performed at [facility], [place] [address] [place], [place] [address]       Antimicrobials:  N/A   Interim history/subjective:  N/A   Objective   BP (!) 160/62   Pulse (!) 47   Temp (!) 97.4 F (36.3 C) (Oral)   Resp 12   Ht 5\' 7"  (1.702 m)   Wt 75 kg   SpO2 98%   BMI 25.90 kg/m     Filed Weights   11/06/19 1122  Weight: 75 kg    Intake/Output Summary (Last 24 hours) at 11/06/2019 2323 Last data filed at 11/06/2019 1812 Gross per 24 hour  Intake 50 ml  Output --  Net 50 ml        Examination: GENERAL: alert, normal mood, behavior, speech and thought processes, affect appropriate to mood, pleasant or well-developed. No acute distress. HEAD: normocephalic, atraumatic EYE: PERRLA,  EOM intact, no scleral icterus, no pallor. EAR: Right external auditory canal is patent but narrow.  Tympanic membrane well visualized.  No effusion.  No perforation.  Left external auditory canal is widely patent.  Tympanic membrane well visualized.  No effusion.  No perforation. NOSE: nares are patent. No polyps. No exudate.  THROAT/ORAL CAVITY: Normal dentition. No oral thrush. No exudate. Mucous membranes are moist. No tonsillar enlargement. Mallampati class IV (only hard palate visible) airway. NECK: supple, no thyromegaly, no JVD, no lymphadenopathy. Trachea midline. CHEST/LUNG: symmetric in development and expansion. Good air entry.  Diffuse bilateral crackles.  No wheezes.   HEART: Regular S1 and S2 without murmur, rub or gallop. ABDOMEN: soft, nontender, nondistended.  Protuberant.  Normoactive bowel sounds. No rebound. No guarding. No hepatosplenomegaly. EXTREMITIES: Edema: 1+ and pitting. No cyanosis.  No clubbing. 2+ DP pulses LYMPHATIC: no cervical/axillary/inguinal lymph nodes appreciated MUSCULOSKELETAL: No point tenderness.  No bulk atrophy. Joints: Normal.  SKIN: No rash or lesion.  NEUROLOGIC: Doll's eyes intact. Corneal reflex intact. Spontaneous respirations intact. Cranial nerves II-XII are grossly symmetric and physiologic. Babinski absent. No sensory deficit. Motor: 5/5 @ RUE, 5/5 @ LUE, 5/5 @ RLL,  5/5 @ LLL.  DTR: 2+ @ R biceps, 2+ @ L biceps, 2+ @ R patellar,  2+ @ L patellar. No cerebellar signs. Gait was not assessed.   Resolved Hospital Problem list   N/A   Assessment & Plan:   ASSESSMENT/PLAN:  ASSESSMENT (included in the Hospital Problem List)  Principal Problem:   Acute renal failure ([place]) Active Problems:   Hyperkalemia   Acute on chronic diastolic CHF (congestive heart failure) (HCC)   CHF (congestive heart failure) (HCC)   OSA (obstructive sleep apnea)   Iatrogenic adrenal insufficiency ([place])   By systems: PULMONARY  DuoNeb/Pulmicort for  now.  His ability to take Trelegy should be assessed.  Hold Trelegy for now.  CPAP q HS for OSA    CARDIOVASCULAR  2D echo (7/7): LVEF 55-60%.  Mild concentric hypertrophy.  No wall motion abnormality.  Grade 1 diastolic dysfunction.  Calcified mitral annulus.  Mildly dilated LA.     RENAL  AKI with hyperkalemia.  Hyperkalemia appears to be improving with furosemide.  Avoid nephrotoxic drugs.  Renal dosing of medications.  Monitor urine output.    GASTROINTESTINAL  Gastric hernia  GI PROPHYLAXIS: Protonix    HEMATOLOGIC  Leukocytosis, likely related to UTI  DVT PROPHYLAXIS: heparin    INFECTIOUS  UTI.  Start Rocephin.  Review of home medication suggests that the patient may recently have had exposure to cefuroxime.  Urine culture ordered.  May need to change antibiotics.    ENDOCRINE  Type 2 diabetes.  Defer management to Dr. Roosevelt Locks.    NEUROLOGIC  Dementia/cognitive impairment, at baseline.   PLAN/RECOMMENDATIONS   Change aerosol therapy to nebulized therapy, as outlined above.  Start Rocephin for UTI.  Cautious diuresis.  Monitor renal function/urine output.    My assessment, plan of care, findings, medications, side effects, etc. were discussed with: nurse and patient's daughter (at bedside).   Best practice:  Diet: diabetic Pain/Anxiety/Delirium protocol (if indicated): N/A VAP protocol (if indicated): N/A DVT prophylaxis: heparin GI prophylaxis: Protonix Glucose control: sliding scale insulin Mobility/Activity: bed rest w/bathroom privileges CODE STATUS: DNR Family Communication:  patient's family (daughter) Disposition: at the discretion of the admitting physician   Labs   CBC: Recent Labs  Lab 11/06/19 1126 11/06/19 1833  WBC 14.5*  --   HGB 9.0* 8.5*  HCT 28.9* 25.0*  MCV 79.8*  --   PLT 214  --     Basic Metabolic Panel: Recent Labs  Lab 11/06/19 1126 11/06/19 1420 11/06/19 1833  NA 137  --  139  K 6.2* 6.4* 5.4*   CL 106  --   --   CO2 19*  --   --   GLUCOSE 190*  --   --   BUN 40*  --   --   CREATININE 2.32*  --   --   CALCIUM 9.0  --   --    GFR: Estimated Creatinine Clearance: 20.6 mL/min (A) (by C-G formula based on SCr of 2.32 mg/dL (H)). Recent Labs  Lab 11/06/19 1126 11/06/19 1415  WBC 14.5*  --   LATICACIDVEN 3.5* 1.9    Liver Function Tests: No results for input(s): AST, ALT, ALKPHOS, BILITOT, PROT, ALBUMIN in the last 168 hours. No results for input(s): LIPASE, AMYLASE in the last 168 hours. No results for input(s): AMMONIA in the last 168 hours.  ABG    Component Value Date/Time   PHART 7.369 11/06/2019 1833   PCO2ART 38.4 11/06/2019 1833   PO2ART 78 (L) 11/06/2019 1833   HCO3 22.1 11/06/2019 1833   TCO2 23 11/06/2019 1833   ACIDBASEDEF 3.0 (H) 11/06/2019 1833   O2SAT 95.0 11/06/2019 1833     Coagulation Profile: No results for input(s): INR, PROTIME in the last 168 hours.  Cardiac Enzymes: No results for input(s): CKTOTAL, CKMB, CKMBINDEX, TROPONINI in the last 168 hours.  HbA1C: Hgb A1c MFr Bld  Date/Time Value Ref Range Status  02/23/2016 12:00 PM 8.3 (H) 4.8 - 5.6 % Final    Comment:    (NOTE)         Pre-diabetes: 5.7 - 6.4         Diabetes: >6.4         Glycemic control for adults with diabetes: <7.0   12/07/2006 03:40 AM (H)  Final   7.0 (NOTE)   The ADA recommends the following therapeutic goals for glycemic   control related to Hgb A1C measurement:   Goal of Therapy:   < 7.0% Hgb A1C   Action Suggested:  > 8.0% Hgb A1C   Ref:  Diabetes Care, 22, Suppl. 1,  1999    CBG: Recent Labs  Lab 11/06/19 1738 11/06/19 1821 11/06/19 2229  GLUCAP 61* 118* 115*     Review of Systems:   See above   Past Medical History   Past Medical History:  Diagnosis Date  . Anemia   . Asthma   . Benign prostatic hypertrophy   . Bilateral sensorineural hearing loss   . CAD (coronary artery disease) 07/2003   a.  3.0 x 28 mm Taxus DES in the mid RCA in 2005   . COPD (chronic obstructive pulmonary disease) ([place])   . Diverticulosis   . DJD (degenerative joint disease)   . DM (diabetes mellitus) ([place])   . Dyslipidemia   . GERD (gastroesophageal reflux disease)   . Hemorrhoids   . Hiatal hernia   . Hypertension   . OSA (obstructive sleep apnea)    cpap use  . Osteoarthritis   . Overactive bladder   . Rhinitis   . Seasonal allergies   . Shingles   . Stroke ([place])   . TIA (transient ischemic attack) 12/2006   Dr. Leonie Man      Surgical History    Past Surgical History:  Procedure Laterality Date  . BACK SURGERY     lower back  . Cataract surgery     1 eye done.  . CORONARY ANGIOPLASTY WITH STENT PLACEMENT  07/2003   Est. EF of 65% with stent to the RCA, by Dr. Acie Fredrickson  . CYSTOSCOPY WITH INSERTION OF UROLIFT N/A 02/25/2016   Procedure: CYSTOSCOPY WITH INSERTION OF UROLIFT x6;  Surgeon: Cleon Gustin, MD;  Location: [facility];  Service: Urology;  Laterality: N/A;  1 HOUR    . INNER EAR SURGERY    . JOINT REPLACEMENT Right   . L5 laminectomy  2004   Dr. Sherwood Gambler  . right total knee  2006   Dr. Maureen Ralphs      Social History   Social History   Socioeconomic History  . Marital status: Married    Spouse name: Not on file  . Number of children: 4  . Years of education: 16+  . Highest education level: Not on file  Occupational History  . Occupation: retired professor  Tobacco Use  . Smoking status: Former Smoker    Packs/day: 1.00    Years: 43.00    Pack years: 43.00    Types: Cigarettes    Quit date: 05/02/1988    Years since quitting: 31.5  . Smokeless tobacco: Never Used  Substance and Sexual Activity  . Alcohol use: Yes    Comment: RARE-social  . Drug use: No  . Sexual activity: Not Currently  Other Topics Concern  . Not on file  Social History Narrative   Lives w/ wife   Caffeine use:      Social Determinants of Health   Financial Resource Strain:   . Difficulty of Paying Living Expenses:   Food Insecurity:    . Worried About Charity fundraiser in the Last Year:   . Arboriculturist in the Last Year:   Transportation Needs:   . Film/video editor (Medical):   Marland Kitchen Lack of Transportation (Non-Medical):   Physical Activity:   . Days of Exercise per Week:   . Minutes of Exercise per Session:   Stress:   . Feeling of Stress :   Social Connections:   . Frequency of Communication with Friends and Family:   . Frequency of Social Gatherings with Friends and Family:   .  Attends Religious Services:   . Active Member of Clubs or Organizations:   . Attends Archivist Meetings:   Marland Kitchen Marital Status:       Family History    Family History  Problem Relation Age of Onset  . Lung disease Father   . Asthma Father   . Sudden death Mother        unknown causes  . Lung disease Brother   . Asthma Brother   . Sudden death Brother 11       unknown causes  . Sudden death Sister        unknown causes  . Sudden death Sister        unknown causes   family history includes Asthma in his brother and father; Lung disease in his brother and father; Sudden death in his mother, sister, and sister; Sudden death (age of onset: 65) in his brother.    Allergies Allergies  Allergen Reactions  . Ciprofloxacin Hcl Other (See Comments)    Unknown reaction  . Mirabegron Other (See Comments)    FATIGUE  . Penicillins     Has patient had a PCN reaction causing immediate rash, facial/tongue/throat swelling, SOB or lightheadedness with hypotension: Unknown Has patient had a PCN reaction causing severe rash involving mucus membranes or skin necrosis: Unknown Has patient had a PCN reaction that required hospitalization Unknown Has patient had a PCN reaction occurring within the last 10 years: Unknown If all of the above answers are "NO", then may proceed with Cephalosporin use.   . Sulfa Antibiotics Hives and Rash  . Sulfamethoxazole Rash  . Sulfonamide Derivatives Rash      Current  Medications  Current Facility-Administered Medications:  .  0.9 %  sodium chloride infusion, 250 mL, Intravenous, PRN, Wynetta Fines T, MD .  acetaminophen (TYLENOL) tablet 650 mg, 650 mg, Oral, Q4H PRN, Wynetta Fines T, MD .  albuterol (PROVENTIL) (2.5 MG/3ML) 0.083% nebulizer solution 0.83 mg, 0.83 mg, Nebulization, BID, Zhang, Ping T, MD .  albuterol (PROVENTIL) (2.5 MG/3ML) 0.083% nebulizer solution 3 mL, 3 mL, Inhalation, Q6H PRN, Wynetta Fines T, MD .  aspirin EC tablet 81 mg, 81 mg, Oral, Q supper, Wynetta Fines T, MD, 81 mg at 11/06/19 2246 .  cholecalciferol (VITAMIN D3) tablet 2,000 Units, 2,000 Units, Oral, Q supper, Wynetta Fines T, MD, 2,000 Units at 11/06/19 2246 .  donepezil (ARICEPT) tablet 10 mg, 10 mg, Oral, Q supper, Wynetta Fines T, MD, 10 mg at 11/06/19 2247 .  escitalopram (LEXAPRO) tablet 5 mg, 5 mg, Oral, Q supper, Wynetta Fines T, MD, 5 mg at 11/06/19 2249 .  ferrous sulfate tablet 325 mg, 325 mg, Oral, QPC supper, Wynetta Fines T, MD, 325 mg at 11/06/19 2248 .  finasteride (PROSCAR) tablet 5 mg, 5 mg, Oral, Q supper, Wynetta Fines T, MD, 5 mg at 11/06/19 2248 .  Fluticasone-Umeclidin-Vilant 100-62.5-25 MCG/INH AEPB 1 puff, 1 puff, Inhalation, Daily, Wynetta Fines T, MD .  furosemide (LASIX) injection 80 mg, 80 mg, Intravenous, TID, Adrian Prows, MD, 80 mg at 11/06/19 2252 .  heparin injection 5,000 Units, 5,000 Units, Subcutaneous, Q12H, Lequita Halt, MD, 5,000 Units at 11/06/19 2252 .  hydrALAZINE (APRESOLINE) tablet 25 mg, 25 mg, Oral, Q6H, Wynetta Fines T, MD, 25 mg at 11/06/19 2248 .  insulin aspart (novoLOG) injection 0-9 Units, 0-9 Units, Subcutaneous, TID WC, Zhang, Ping T, MD .  isosorbide mononitrate (IMDUR) 24 hr tablet 30 mg, 30 mg, Oral, Daily, Zhang, Ping T,  MD .  loratadine (CLARITIN) tablet 10 mg, 10 mg, Oral, Daily, Zhang, Ping T, MD .  melatonin tablet 5 mg, 5 mg, Oral, QHS, Wynetta Fines T, MD, 5 mg at 11/06/19 2248 .  memantine (NAMENDA) tablet 10 mg, 10 mg, Oral, BID, Wynetta Fines T, MD, 10 mg at 11/06/19 2249 .  mirabegron ER (MYRBETRIQ) tablet 50 mg, 50 mg, Oral, Q supper, Wynetta Fines T, MD, 50 mg at 11/06/19 2250 .  montelukast (SINGULAIR) tablet 10 mg, 10 mg, Oral, Daily, Wynetta Fines T, MD .  multivitamin with minerals tablet 1 tablet, 1 tablet, Oral, Daily, Lequita Halt, MD, 1 tablet at 11/06/19 2247 .  ondansetron (ZOFRAN) injection 4 mg, 4 mg, Intravenous, Q6H PRN, Wynetta Fines T, MD .  pantoprazole (PROTONIX) EC tablet 40 mg, 40 mg, Oral, BID, Wynetta Fines T, MD, 40 mg at 11/06/19 2249 .  sodium chloride flush (NS) 0.9 % injection 3 mL, 3 mL, Intravenous, Q12H, Zhang, Ping T, MD .  sodium chloride flush (NS) 0.9 % injection 3 mL, 3 mL, Intravenous, PRN, Lequita Halt, MD  Current Outpatient Medications:  .  albuterol (PROVENTIL HFA;VENTOLIN HFA) 108 (90 Base) MCG/ACT inhaler, Inhale 2 puffs into the lungs every 6 (six) hours as needed for wheezing or shortness of breath., Disp: , Rfl:  .  albuterol (PROVENTIL) (2.5 MG/3ML) 0.083% nebulizer solution, Take 2.5 mg by nebulization 2 (two) times daily. , Disp: , Rfl:  .  amLODipine (NORVASC) 5 MG tablet, Take 5 mg by mouth daily. , Disp: , Rfl:  .  amoxicillin (AMOXIL) 500 MG capsule, Take 2,000 mg by mouth See admin instructions. Take 4 capsules (2000 mg) by mouth one hour prior to dental appointment (last appointment November 2018), Disp: , Rfl: 0 .  aspirin EC 81 MG tablet, Take 81 mg by mouth daily with supper., Disp: , Rfl:  .  atorvastatin (LIPITOR) 20 MG tablet, Take 20 mg by mouth daily. , Disp: , Rfl:  .  brimonidine (ALPHAGAN) 0.15 % ophthalmic solution, Place 1 drop into the left eye 2 (two) times daily., Disp: , Rfl:  .  Cholecalciferol (VITAMIN D) 2000 units CAPS, Take 2,000 Units by mouth daily with supper. , Disp: , Rfl:  .  clopidogrel (PLAVIX) 75 MG tablet, Take 1 tablet (75 mg total) by mouth daily., Disp: 30 tablet, Rfl: 0 .  donepezil (ARICEPT) 10 MG tablet, Take 10 mg by mouth daily with supper.,  Disp: , Rfl: 3 .  escitalopram (LEXAPRO) 5 MG tablet, Take 5 mg by mouth daily with supper., Disp: , Rfl: 2 .  finasteride (PROSCAR) 5 MG tablet, Take 5 mg by mouth daily with supper. , Disp: , Rfl:  .  Fluticasone-Umeclidin-Vilant (TRELEGY ELLIPTA) 100-62.5-25 MCG/INH AEPB, Inhale 1 puff into the lungs daily., Disp: , Rfl:  .  insulin lispro (HUMALOG KWIKPEN) 100 UNIT/ML KiwkPen, Inject 2-5 Units into the skin See admin instructions. 2-5 units per sliding scale, Disp: , Rfl:  .  iron polysaccharides (NIFEREX) 150 MG capsule, Take 150 mg by mouth every other day., Disp: , Rfl:  .  latanoprost (XALATAN) 0.005 % ophthalmic solution, Place 1 drop into both eyes at bedtime., Disp: , Rfl:  .  loratadine (CLARITIN) 10 MG tablet, Take 10 mg by mouth daily. , Disp: , Rfl:  .  memantine (NAMENDA) 10 MG tablet, Take 10 mg by mouth 2 (two) times daily., Disp: , Rfl: 3 .  mirabegron ER (MYRBETRIQ) 50 MG TB24 tablet, Take 50  mg by mouth daily with supper., Disp: , Rfl:  .  montelukast (SINGULAIR) 10 MG tablet, Take 10 mg by mouth daily. , Disp: , Rfl:  .  Multiple Vitamin (MULTIVITAMIN WITH MINERALS) TABS tablet, Take 1 tablet by mouth daily., Disp: , Rfl:  .  nebivolol (BYSTOLIC) 5 MG tablet, Take 5 mg by mouth daily., Disp: , Rfl:  .  nitroGLYCERIN (NITROSTAT) 0.4 MG SL tablet, Place 0.4 mg under the tongue every 5 (five) minutes as needed for chest pain. , Disp: , Rfl:  .  pantoprazole (PROTONIX) 40 MG tablet, Take 40 mg by mouth 2 (two) times daily. , Disp: , Rfl:  .  predniSONE (DELTASONE) 5 MG tablet, Take 7.5 mg by mouth daily with breakfast. , Disp: , Rfl:  .  sitaGLIPtin-metformin (JANUMET) 50-1000 MG tablet, Take 1 tablet by mouth 2 (two) times daily with a meal.  , Disp: , Rfl:  .  telmisartan (MICARDIS) 80 MG tablet, Take 80 mg by mouth daily with supper. , Disp: , Rfl:  .  vitamin C (ASCORBIC ACID) 500 MG tablet, Take 500 mg by mouth daily with supper. , Disp: , Rfl:  .  cefUROXime (CEFTIN) 500 MG  tablet, Take 1 tablet (500 mg total) by mouth 2 (two) times daily with a meal. (Patient not taking: Reported on 11/06/2019), Disp: 4 tablet, Rfl: 0 .  ferrous sulfate 324 (65 Fe) MG TBEC, Take 324 mg by mouth daily with supper. (Patient not taking: Reported on 11/06/2019), Disp: , Rfl:  .  Melatonin 5 MG TABS, Take 5 mg by mouth at bedtime. (Patient not taking: Reported on 11/06/2019), Disp: , Rfl:   Home Medications  Prior to Admission medications   Medication Sig Start Date End Date Taking? Authorizing Provider  albuterol (PROVENTIL HFA;VENTOLIN HFA) 108 (90 Base) MCG/ACT inhaler Inhale 2 puffs into the lungs every 6 (six) hours as needed for wheezing or shortness of breath.   Yes [provider]  albuterol (PROVENTIL) (2.5 MG/3ML) 0.083% nebulizer solution Take 2.5 mg by nebulization 2 (two) times daily.    Yes [provider]  amLODipine (NORVASC) 5 MG tablet Take 5 mg by mouth daily.    Yes [provider]  amoxicillin (AMOXIL) 500 MG capsule Take 2,000 mg by mouth See admin instructions. Take 4 capsules (2000 mg) by mouth one hour prior to dental appointment (last appointment November 2018) 03/10/17  Yes [provider]  aspirin EC 81 MG tablet Take 81 mg by mouth daily with supper.   Yes [provider]  atorvastatin (LIPITOR) 20 MG tablet Take 20 mg by mouth daily.    Yes [provider]  brimonidine (ALPHAGAN) 0.15 % ophthalmic solution Place 1 drop into the left eye 2 (two) times daily. 10/29/19  Yes [provider]  Cholecalciferol (VITAMIN D) 2000 units CAPS Take 2,000 Units by mouth daily with supper.    Yes [provider]  clopidogrel (PLAVIX) 75 MG tablet Take 1 tablet (75 mg total) by mouth daily. 02/29/16  Yes McKenzie, Candee Furbish, MD  donepezil (ARICEPT) 10 MG tablet Take 10 mg by mouth daily with supper. 04/08/17  Yes [provider]  escitalopram (LEXAPRO) 5 MG tablet Take 5 mg by mouth daily with supper.  02/13/17  Yes [provider]  finasteride (PROSCAR) 5 MG tablet Take 5 mg by mouth daily with supper.  04/16/15  Yes [provider]  Fluticasone-Umeclidin-Vilant (TRELEGY ELLIPTA) 100-62.5-25 MCG/INH AEPB Inhale 1 puff into the lungs daily.  Yes [provider]  insulin lispro (HUMALOG KWIKPEN) 100 UNIT/ML KiwkPen Inject 2-5 Units into the skin See admin instructions. 2-5 units per sliding scale   Yes [provider]  iron polysaccharides (NIFEREX) 150 MG capsule Take 150 mg by mouth every other day.   Yes [provider]  latanoprost (XALATAN) 0.005 % ophthalmic solution Place 1 drop into both eyes at bedtime. 06/20/19  Yes [provider]  loratadine (CLARITIN) 10 MG tablet Take 10 mg by mouth daily.    Yes [provider]  memantine (NAMENDA) 10 MG tablet Take 10 mg by mouth 2 (two) times daily. 03/18/17  Yes [provider]  mirabegron ER (MYRBETRIQ) 50 MG TB24 tablet Take 50 mg by mouth daily with supper.   Yes [provider]  montelukast (SINGULAIR) 10 MG tablet Take 10 mg by mouth daily.    Yes [provider]  Multiple Vitamin (MULTIVITAMIN WITH MINERALS) TABS tablet Take 1 tablet by mouth daily.   Yes [provider]  nebivolol (BYSTOLIC) 5 MG tablet Take 5 mg by mouth daily.   Yes [provider]  nitroGLYCERIN (NITROSTAT) 0.4 MG SL tablet Place 0.4 mg under the tongue every 5 (five) minutes as needed for chest pain.    Yes [provider]  pantoprazole (PROTONIX) 40 MG tablet Take 40 mg by mouth 2 (two) times daily.    Yes [provider]  predniSONE (DELTASONE) 5 MG tablet Take 7.5 mg by mouth daily with breakfast.    Yes [provider]  sitaGLIPtin-metformin (JANUMET) 50-1000 MG tablet Take 1 tablet by mouth 2 (two) times daily with a meal.     Yes [provider]  telmisartan (MICARDIS) 80 MG tablet Take 80 mg by mouth daily with supper.     Yes [provider]  vitamin C (ASCORBIC ACID) 500 MG tablet Take 500 mg by mouth daily with supper.    Yes [provider]  cefUROXime (CEFTIN) 500 MG tablet Take 1 tablet (500 mg total) by mouth 2 (two) times daily with a meal. Patient not taking: Reported on 11/06/2019 05/10/17   Geradine Girt, DO  ferrous sulfate 324 (65 Fe) MG TBEC Take 324 mg by mouth daily with supper. Patient not taking: Reported on 11/06/2019    [provider]  Melatonin 5 MG TABS Take 5 mg by mouth at bedtime. Patient not taking: Reported on 11/06/2019    [provider]      Critical care time: 45 minutes.  The treatment and management of the patient's condition was required based on the threat of imminent deterioration. This time reflects time spent by the physician evaluating, providing care and managing the critically ill patient's care. The time was spent at the immediate bedside (or on the same floor/unit and dedicated to this patient's care). Time involved in separately billable procedures is NOT included int he critical care time indicated above. Family meeting and update time may be included above if and only if the patient is unable/incompetent to participate in clinical interview and/or decision making, and the discussion was necessary to determining treatment decisions.    Renee Pain, MD Board Certified by the [employer], [place] Pager: [phone]

## 2019-11-06 NOTE — Progress Notes (Signed)
Pt is more symptomatic, O2 level labile, d/w cardio attending, will escalate to PCU for close monitoring and possible BIPAP if no improvement later. Repeat BMP pending.

## 2019-11-06 NOTE — ED Notes (Signed)
Message sent to pharmacy to verify home meds

## 2019-11-06 NOTE — ED Notes (Signed)
Berneta Sages MD (Son) [phone]

## 2019-11-06 NOTE — H&P (Signed)
History and Physical    [name] IEP:[id number] DOB: 05-Oct-1928 DOA: 11/06/2019  PCP: Leanna Battles, MD (Confirm with patient/family/NH records and if not entered, this has to be entered at [facility] point of entry) Patient coming from: Home  I have personally briefly reviewed patient's old medical records in [place]  Chief Complaint: SOB  HPI: [name] is a 86 y.o. male with medical history significant of of coronary artery disease with stent, COPD, IDDM, hypertension, and GERD presented with increasing SOB. Patient is baseline demented, most of the history provided by patient daughter at bedside. According to the family, this has been worsening over the past week with increasing bilateral leg swelling and over the last 2 days patient also developed neck and facial swelling. There was also some wheezing as Patient denies to me he is experiencing any chest pain, fever, or productive cough.  No fever or chills.  ED Course: Chest x-ray showed cardiomegaly and lung congestion, potassium 6.2, bicarb 19, mag 1.12 years ago.  EKG showed junctional rhythm, no acute ST changes  Review of Systems: Unable to perform, patient baseline demented  Past Medical History:  Diagnosis Date  . Anemia   . Asthma   . Benign prostatic hypertrophy   . Bilateral sensorineural hearing loss   . CAD (coronary artery disease) 07/2003   a.  3.0 x 28 mm Taxus DES in the mid RCA in 2005  . COPD (chronic obstructive pulmonary disease) ([place])   . Diverticulosis   . DJD (degenerative joint disease)   . DM (diabetes mellitus) ([place])   . Dyslipidemia   . GERD (gastroesophageal reflux disease)   . Hemorrhoids   . Hiatal hernia   . Hypertension   . OSA (obstructive sleep apnea)    cpap use  . Osteoarthritis   . Overactive bladder   . Rhinitis   . Seasonal allergies   . Shingles   . Stroke ([place])   . TIA (transient ischemic attack) 12/2006   Dr. Leonie Man    Past Surgical History:  Procedure Laterality  Date  . BACK SURGERY     lower back  . Cataract surgery     1 eye done.  . CORONARY ANGIOPLASTY WITH STENT PLACEMENT  07/2003   Est. EF of 65% with stent to the RCA, by Dr. Acie Fredrickson  . CYSTOSCOPY WITH INSERTION OF UROLIFT N/A 02/25/2016   Procedure: CYSTOSCOPY WITH INSERTION OF UROLIFT x6;  Surgeon: Cleon Gustin, MD;  Location: [facility];  Service: Urology;  Laterality: N/A;  1 HOUR    . INNER EAR SURGERY    . JOINT REPLACEMENT Right   . L5 laminectomy  2004   Dr. Sherwood Gambler  . right total knee  2006   Dr. Maureen Ralphs     reports that he quit smoking about 31 years ago. His smoking use included cigarettes. He has a 43.00 pack-year smoking history. He has never used smokeless tobacco. He reports current alcohol use. He reports that he does not use drugs.  Allergies  Allergen Reactions  . Ciprofloxacin Hcl Other (See Comments)    Unknown reaction  . Mirabegron Other (See Comments)    FATIGUE  . Penicillins     Has patient had a PCN reaction causing immediate rash, facial/tongue/throat swelling, SOB or lightheadedness with hypotension: Unknown Has patient had a PCN reaction causing severe rash involving mucus membranes or skin necrosis: Unknown Has patient had a PCN reaction that required hospitalization Unknown Has patient had a PCN  reaction occurring within the last 10 years: Unknown If all of the above answers are "NO", then may proceed with Cephalosporin use.   . Sulfa Antibiotics Hives and Rash  . Sulfamethoxazole Rash  . Sulfonamide Derivatives Rash    Family History  Problem Relation Age of Onset  . Lung disease Father   . Asthma Father   . Sudden death Mother        unknown causes  . Lung disease Brother   . Asthma Brother   . Sudden death Brother 27       unknown causes  . Sudden death Sister        unknown causes  . Sudden death Sister        unknown causes    Prior to Admission medications   Medication Sig Start Date End Date Taking? Authorizing Provider   albuterol (PROVENTIL HFA;VENTOLIN HFA) 108 (90 Base) MCG/ACT inhaler Inhale 2 puffs into the lungs every 6 (six) hours as needed for wheezing or shortness of breath.    [provider]  albuterol (PROVENTIL) (2.5 MG/3ML) 0.083% nebulizer solution Take 0.83 mg by nebulization 2 (two) times daily.     [provider]  amLODipine (NORVASC) 5 MG tablet Take 5 mg by mouth daily.     [provider]  amoxicillin (AMOXIL) 500 MG capsule Take 2,000 mg by mouth See admin instructions. Take 4 capsules (2000 mg) by mouth one hour prior to dental appointment (last appointment November 2018) 03/10/17   [provider]  aspirin EC 81 MG tablet Take 81 mg by mouth daily with supper.    [provider]  atorvastatin (LIPITOR) 20 MG tablet Take 20 mg by mouth daily.     [provider]  cefUROXime (CEFTIN) 500 MG tablet Take 1 tablet (500 mg total) by mouth 2 (two) times daily with a meal. 05/10/17   Geradine Girt, DO  Cholecalciferol (VITAMIN D) 2000 units CAPS Take 2,000 Units by mouth daily with supper.     [provider]  clopidogrel (PLAVIX) 75 MG tablet Take 1 tablet (75 mg total) by mouth daily. 02/29/16   McKenzie, Candee Furbish, MD  donepezil (ARICEPT) 10 MG tablet Take 10 mg by mouth daily with supper. 04/08/17   [provider]  escitalopram (LEXAPRO) 5 MG tablet Take 5 mg by mouth daily with supper. 02/13/17   [provider]  ferrous sulfate 324 (65 Fe) MG TBEC Take 324 mg by mouth daily with supper.    [provider]  finasteride (PROSCAR) 5 MG tablet Take 5 mg by mouth daily with supper.  04/16/15   [provider]  Fluticasone-Umeclidin-Vilant (TRELEGY ELLIPTA) 100-62.5-25 MCG/INH AEPB Inhale 1 puff into the lungs daily.    [provider]  insulin lispro (HUMALOG KWIKPEN) 100 UNIT/ML KiwkPen Inject 4 Units into the skin See admin instructions. Inject 4 units subcutaneously twice daily - after lunch  and before supper    [provider]  loratadine (CLARITIN) 10 MG tablet Take 10 mg by mouth daily.     [provider]  Melatonin 5 MG TABS Take 5 mg by mouth at bedtime.    [provider]  memantine (NAMENDA) 10 MG tablet Take 10 mg by mouth 2 (two) times daily. 03/18/17   [provider]  mirabegron ER (MYRBETRIQ) 50 MG TB24 tablet Take 50 mg by mouth daily with supper.    [provider]  montelukast (SINGULAIR) 10 MG tablet Take 10 mg  by mouth daily.     [provider]  Multiple Vitamin (MULTIVITAMIN WITH MINERALS) TABS tablet Take 1 tablet by mouth daily.    [provider]  nebivolol (BYSTOLIC) 5 MG tablet Take 5 mg by mouth daily.    [provider]  nitroGLYCERIN (NITROSTAT) 0.4 MG SL tablet Place 0.4 mg under the tongue every 5 (five) minutes as needed for chest pain.     [provider]  pantoprazole (PROTONIX) 40 MG tablet Take 40 mg by mouth 2 (two) times daily.     [provider]  predniSONE (DELTASONE) 5 MG tablet Take 5 mg by mouth daily with breakfast.     [provider]  sitaGLIPtin-metformin (JANUMET) 50-1000 MG tablet Take 1 tablet by mouth 2 (two) times daily with a meal.      [provider]  telmisartan (MICARDIS) 80 MG tablet Take 80 mg by mouth daily with supper.     [provider]  vitamin C (ASCORBIC ACID) 500 MG tablet Take 500 mg by mouth daily with supper.     [provider]    Physical Exam: Vitals:   11/06/19 1111 11/06/19 1114 11/06/19 1122 11/06/19 1329  BP:  (!) 148/70  (!) 170/73  Pulse:  (!) 50  (!) 45  Resp:  20  12  Temp:  97.6 F (36.4 C)  (!) 97.4 F (36.3 C)  TempSrc:  Oral  Oral  SpO2: 98% 99%  99%  Weight:   75 kg   Height:   5\' 7"  (1.702 m)     Constitutional: NAD, calm, comfortable Vitals:   11/06/19 1111 11/06/19 1114 11/06/19 1122 11/06/19 1329  BP:  (!) 148/70  (!) 170/73  Pulse:  (!) 50  (!) 45  Resp:   20  12  Temp:  97.6 F (36.4 C)  (!) 97.4 F (36.3 C)  TempSrc:  Oral  Oral  SpO2: 98% 99%  99%  Weight:   75 kg   Height:   5\' 7"  (1.702 m)    Eyes: PERRL, lids and conjunctivae normal ENMT: Mucous membranes are moist. Posterior pharynx clear of any exudate or lesions.Normal dentition.  Neck: normal, supple, no masses, no thyromegaly, JVD about 5 to 6 cm above clavicle Respiratory: clear to auscultation bilaterally, fine crackles of bilateral lower fields. Normal respiratory effort. No accessory muscle use.  Cardiovascular: Regular rate and rhythm, no murmurs / rubs / gallops.  2+ extremity edema. 2+ pedal pulses. No carotid bruits.  Abdomen: no tenderness, no masses palpated. No hepatosplenomegaly. Bowel sounds positive.  Musculoskeletal: no clubbing / cyanosis. No joint deformity upper and lower extremities. Good ROM, no contractures. Normal muscle tone.  Skin: no rashes, lesions, ulcers. No induration Neurologic: Moving all limbs, following simple commands Psychiatric: Baseline confused    Labs on Admission: I have personally reviewed following labs and imaging studies  CBC: Recent Labs  Lab 11/06/19 1126  WBC 14.5*  HGB 9.0*  HCT 28.9*  MCV 79.8*  PLT 366   Basic Metabolic Panel: Recent Labs  Lab 11/06/19 1126  NA 137  K 6.2*  CL 106  CO2 19*  GLUCOSE 190*  BUN 40*  CREATININE 2.32*  CALCIUM 9.0   GFR: Estimated Creatinine Clearance: 20.6 mL/min (A) (by C-G formula based on SCr of 2.32 mg/dL (H)). Liver Function Tests: No results for input(s): AST, ALT, ALKPHOS, BILITOT, PROT, ALBUMIN in the last 168 hours. No results for input(s): LIPASE, AMYLASE in the last 168 hours.  No results for input(s): AMMONIA in the last 168 hours. Coagulation Profile: No results for input(s): INR, PROTIME in the last 168 hours. Cardiac Enzymes: No results for input(s): CKTOTAL, CKMB, CKMBINDEX, TROPONINI in the last 168 hours. BNP (last 3 results) No results for input(s):  PROBNP in the last 8760 hours. HbA1C: No results for input(s): HGBA1C in the last 72 hours. CBG: No results for input(s): GLUCAP in the last 168 hours. Lipid Profile: No results for input(s): CHOL, HDL, LDLCALC, TRIG, CHOLHDL, LDLDIRECT in the last 72 hours. Thyroid Function Tests: No results for input(s): TSH, T4TOTAL, FREET4, T3FREE, THYROIDAB in the last 72 hours. Anemia Panel: No results for input(s): VITAMINB12, FOLATE, FERRITIN, TIBC, IRON, RETICCTPCT in the last 72 hours. Urine analysis:    Component Value Date/Time   COLORURINE YELLOW 05/07/2017 2351   APPEARANCEUR CLEAR 05/07/2017 2351   LABSPEC 1.018 05/07/2017 2351   PHURINE 5.0 05/07/2017 2351   GLUCOSEU 50 (A) 05/07/2017 2351   HGBUR SMALL (A) 05/07/2017 2351   BILIRUBINUR NEGATIVE 05/07/2017 2351   KETONESUR NEGATIVE 05/07/2017 2351   PROTEINUR 30 (A) 05/07/2017 2351   UROBILINOGEN 0.2 09/24/2014 1442   NITRITE NEGATIVE 05/07/2017 2351   LEUKOCYTESUR LARGE (A) 05/07/2017 2351    Radiological Exams on Admission: DG Chest 2 View  Result Date: 11/06/2019 CLINICAL DATA:  Onset shortness of breath this morning. EXAM: CHEST - 2 VIEW COMPARISON:  Single-view of the chest 05/07/2017. FINDINGS: There is cardiomegaly and interstitial pulmonary edema with small bilateral pleural effusions. No pneumothorax. IMPRESSION: Cardiomegaly and interstitial edema. Electronically Signed   By: Inge Rise M.D.   On: 11/06/2019 12:15    EKG: Independently reviewed.  Sinus bradycardia  Assessment/Plan Active Problems:   Acute on chronic diastolic CHF (congestive heart failure) (HCC)   CHF (congestive heart failure) ([place])  (please populate well all problems here in Problem List. (For example, if patient is on BP meds at home and you resume or decide to hold them, it is a problem that needs to be her. Same for CAD, COPD, HLD and so on)  Acute on chronic diastolic CHF decompensation -Significant blood pressure elevation, Lasix stat  and then IV twice daily -Consult cardiologist, waiting for callback -Echo -Discontinue amlodipine given worsening of peripheral edema, start hydralazine and Imdur regimen.  Continue aggressive diuresis  Hyperkalemia -Likely from CHF decompensation and renal failure -Hold ARB, patient received cocktail of insulin, calcium gluconate, and Lasix -[id number] -recheck K level this evening.  AKI versus CKD -Suspect cardio-renal syndrome -Check renal ultrasound to rule out any obstruction -No patient is fluid overload, will continue twice daily Lasix for now monitor kidney function  Non-anion gap metabolic acidosis -From renal failure, Start bicarb p.o.  COPD -No symptoms or symptoms signs of acute exacerbation -Continue home regimen  HTN -As above, Stop CCB and ARB, hold beta-blocker due to sinus bradycardia -Start Lasix, hydralazine plus Imdur regimen  IDDM -Hold oral DM meds -Sliding scale for now.  Deconditioning -PT evaluation   DVT prophylaxis: Heparin subcu Code Status: Full code Family Communication: Son and daughter at bedside Disposition Plan: Patient has complicated medical issues hyperkalemia and CHF decompensation AKI likely will need more than 2 midnight hospital stay Consults called: Dr. Einar Gip Admission status: Telemetry admission   Lequita Halt MD [facility] Pager [phone]    11/06/2019, 2:55 PM

## 2019-11-07 ENCOUNTER — Inpatient Hospital Stay (HOSPITAL_COMMUNITY): Payer: Medicare PPO

## 2019-11-07 DIAGNOSIS — R6520 Severe sepsis without septic shock: Secondary | ICD-10-CM

## 2019-11-07 DIAGNOSIS — A419 Sepsis, unspecified organism: Secondary | ICD-10-CM

## 2019-11-07 DIAGNOSIS — M79602 Pain in left arm: Secondary | ICD-10-CM

## 2019-11-07 DIAGNOSIS — G4733 Obstructive sleep apnea (adult) (pediatric): Secondary | ICD-10-CM

## 2019-11-07 DIAGNOSIS — J441 Chronic obstructive pulmonary disease with (acute) exacerbation: Secondary | ICD-10-CM

## 2019-11-07 DIAGNOSIS — N178 Other acute kidney failure: Secondary | ICD-10-CM

## 2019-11-07 DIAGNOSIS — I5033 Acute on chronic diastolic (congestive) heart failure: Secondary | ICD-10-CM

## 2019-11-07 DIAGNOSIS — R600 Localized edema: Secondary | ICD-10-CM

## 2019-11-07 LAB — HEMOGLOBIN A1C
Hgb A1c MFr Bld: 9.4 % — ABNORMAL HIGH (ref 4.8–5.6)
Mean Plasma Glucose: 223.08 mg/dL

## 2019-11-07 LAB — TSH: TSH: 3.37 u[IU]/mL (ref 0.350–4.500)

## 2019-11-07 LAB — BASIC METABOLIC PANEL
Anion gap: 11 (ref 5–15)
BUN: 36 mg/dL — ABNORMAL HIGH (ref 8–23)
CO2: 23 mmol/L (ref 22–32)
Calcium: 9.2 mg/dL (ref 8.9–10.3)
Chloride: 105 mmol/L (ref 98–111)
Creatinine, Ser: 2.52 mg/dL — ABNORMAL HIGH (ref 0.61–1.24)
GFR calc Af Amer: 25 mL/min — ABNORMAL LOW (ref >60)
GFR calc non Af Amer: 22 mL/min — ABNORMAL LOW (ref >60)
Glucose, Bld: 123 mg/dL — ABNORMAL HIGH (ref 70–99)
Potassium: 5.1 mmol/L (ref 3.5–5.1)
Sodium: 139 mmol/L (ref 135–145)

## 2019-11-07 LAB — I-STAT CHEM 8, ED
BUN: 37 mg/dL — ABNORMAL HIGH (ref 8–23)
Calcium, Ion: 1.23 mmol/L (ref 1.15–1.40)
Chloride: 103 mmol/L (ref 98–111)
Creatinine, Ser: 2.7 mg/dL — ABNORMAL HIGH (ref 0.61–1.24)
Glucose, Bld: 119 mg/dL — ABNORMAL HIGH (ref 70–99)
HCT: 27 % — ABNORMAL LOW (ref 39.0–52.0)
Hemoglobin: 9.2 g/dL — ABNORMAL LOW (ref 13.0–17.0)
Potassium: 5.1 mmol/L (ref 3.5–5.1)
Sodium: 140 mmol/L (ref 135–145)
TCO2: 25 mmol/L (ref 22–32)

## 2019-11-07 LAB — CBC
HCT: 28.3 % — ABNORMAL LOW (ref 39.0–52.0)
Hemoglobin: 8.7 g/dL — ABNORMAL LOW (ref 13.0–17.0)
MCH: 24.2 pg — ABNORMAL LOW (ref 26.0–34.0)
MCHC: 30.7 g/dL (ref 30.0–36.0)
MCV: 78.6 fL — ABNORMAL LOW (ref 80.0–100.0)
Platelets: 214 10*3/uL (ref 150–400)
RBC: 3.6 MIL/uL — ABNORMAL LOW (ref 4.22–5.81)
RDW: 17.5 % — ABNORMAL HIGH (ref 11.5–15.5)
WBC: 11.5 10*3/uL — ABNORMAL HIGH (ref 4.0–10.5)
nRBC: 0.2 % (ref 0.0–0.2)

## 2019-11-07 LAB — SYNOVIAL CELL COUNT + DIFF, W/ CRYSTALS
Crystals, Fluid: NONE SEEN
Eosinophils-Synovial: 0 % (ref 0–1)
Lymphocytes-Synovial Fld: 2 % (ref 0–20)
Monocyte-Macrophage-Synovial Fluid: 5 % — ABNORMAL LOW (ref 50–90)
Neutrophil, Synovial: 93 % — ABNORMAL HIGH (ref 0–25)
WBC, Synovial: 26250 /mm3 — ABNORMAL HIGH (ref 0–200)

## 2019-11-07 LAB — IRON AND TIBC
Iron: 38 ug/dL — ABNORMAL LOW (ref 45–182)
Saturation Ratios: 13 % — ABNORMAL LOW (ref 17.9–39.5)
TIBC: 295 ug/dL (ref 250–450)
UIBC: 257 ug/dL

## 2019-11-07 LAB — FERRITIN: Ferritin: 60 ng/mL (ref 24–336)

## 2019-11-07 LAB — ECHOCARDIOGRAM COMPLETE
Height: 67 in
Weight: 2881.85 oz

## 2019-11-07 LAB — RETICULOCYTES
Immature Retic Fract: 23.9 % — ABNORMAL HIGH (ref 2.3–15.9)
RBC.: 3.62 MIL/uL — ABNORMAL LOW (ref 4.22–5.81)
Retic Count, Absolute: 60.5 10*3/uL (ref 19.0–186.0)
Retic Ct Pct: 1.7 % (ref 0.4–3.1)

## 2019-11-07 LAB — GLUCOSE, CAPILLARY
Glucose-Capillary: 108 mg/dL — ABNORMAL HIGH (ref 70–99)
Glucose-Capillary: 140 mg/dL — ABNORMAL HIGH (ref 70–99)
Glucose-Capillary: 177 mg/dL — ABNORMAL HIGH (ref 70–99)
Glucose-Capillary: 179 mg/dL — ABNORMAL HIGH (ref 70–99)

## 2019-11-07 LAB — CBG MONITORING, ED: Glucose-Capillary: 116 mg/dL — ABNORMAL HIGH (ref 70–99)

## 2019-11-07 LAB — BRAIN NATRIURETIC PEPTIDE: B Natriuretic Peptide: 377.8 pg/mL — ABNORMAL HIGH (ref 0.0–100.0)

## 2019-11-07 MED ORDER — AMLODIPINE BESYLATE 5 MG PO TABS
5.0000 mg | ORAL_TABLET | Freq: Every day | ORAL | Status: DC
  Administered 2019-11-07 – 2019-11-12 (×6): 5 mg via ORAL
  Filled 2019-11-07 (×6): qty 1

## 2019-11-07 MED ORDER — LATANOPROST 0.005 % OP SOLN
1.0000 [drp] | Freq: Every day | OPHTHALMIC | Status: DC
  Administered 2019-11-07 – 2019-11-12 (×6): 1 [drp] via OPHTHALMIC
  Filled 2019-11-07: qty 2.5

## 2019-11-07 MED ORDER — CHLORHEXIDINE GLUCONATE CLOTH 2 % EX PADS
6.0000 | MEDICATED_PAD | Freq: Every day | CUTANEOUS | Status: DC
  Administered 2019-11-08 – 2019-11-10 (×3): 6 via TOPICAL

## 2019-11-07 MED ORDER — VANCOMYCIN HCL IN DEXTROSE 1-5 GM/200ML-% IV SOLN
1000.0000 mg | Freq: Once | INTRAVENOUS | Status: DC
  Filled 2019-11-07: qty 200

## 2019-11-07 MED ORDER — FUROSEMIDE 10 MG/ML IJ SOLN
40.0000 mg | Freq: Three times a day (TID) | INTRAMUSCULAR | Status: DC
  Administered 2019-11-07 (×3): 40 mg via INTRAVENOUS
  Filled 2019-11-07 (×2): qty 4

## 2019-11-07 MED ORDER — VANCOMYCIN HCL IN DEXTROSE 1-5 GM/200ML-% IV SOLN
1000.0000 mg | INTRAVENOUS | Status: DC
  Administered 2019-11-07: 1000 mg via INTRAVENOUS
  Filled 2019-11-07: qty 200

## 2019-11-07 MED ORDER — BRIMONIDINE TARTRATE 0.15 % OP SOLN
1.0000 [drp] | Freq: Two times a day (BID) | OPHTHALMIC | Status: DC
  Administered 2019-11-07 – 2019-11-13 (×13): 1 [drp] via OPHTHALMIC
  Filled 2019-11-07: qty 5

## 2019-11-07 MED ORDER — DOXYCYCLINE HYCLATE 100 MG PO TABS
100.0000 mg | ORAL_TABLET | Freq: Two times a day (BID) | ORAL | Status: DC
  Administered 2019-11-07: 100 mg via ORAL

## 2019-11-07 MED ORDER — PREDNISONE 5 MG PO TABS: 15.0000 mg | ORAL_TABLET | Freq: Every day | ORAL | Status: DC

## 2019-11-07 NOTE — ED Notes (Signed)
Report called to rn on 4e 

## 2019-11-07 NOTE — Progress Notes (Addendum)
Subjective:  Patient's daughter present at the bedside.  Patient states that he feels a whole lot better this morning.  Breathing has improved.  Had good appetite and finished his breakfast.  Used CPAP last night.  Intake/Output from previous day:  I/O last 3 completed shifts: In: 40 [IV Piggyback:50] Out: 1000 [Urine:1000] Total I/O In: -  Out: 750 [Urine:750]  Blood pressure 132/88, pulse (!) 55, temperature 97.6 F (36.4 C), temperature source Axillary, resp. rate 17, height _0  (1.702 m), weight 81.7 kg, SpO2 100 %. Physical Exam Constitutional:      General: He is not in acute distress.    Appearance: He is not ill-appearing.  Neck:     Vascular: No JVD.  Cardiovascular:     Rate and Rhythm: Normal rate and regular rhythm.     Pulses:          Dorsalis pedis pulses are 1+ on the right side and 1+ on the left side.       Posterior tibial pulses are 1+ on the right side and 1+ on the left side.     Heart sounds: Normal heart sounds. No gallop.   Pulmonary:     Breath sounds: Examination of the right-lower field reveals decreased breath sounds and rales. Examination of the left-lower field reveals decreased breath sounds and rales. Decreased breath sounds and rales present.  Musculoskeletal:     Cervical back: Normal range of motion.    Lab Results: BNP (last 3 results) Recent Labs    11/06/19 1126 11/07/19 0241  BNP 477.3* 377.8*    ProBNP (last 3 results) No results for input(s): PROBNP in the last 8760 hours. BMP Latest Ref Rng & Units 11/07/2019 11/07/2019 11/06/2019  Glucose 70 - 99 mg/dL 119(H) 123(H) -  BUN 8 - 23 mg/dL 37(H) 36(H) -  Creatinine 0.61 - 1.24 mg/dL 2.70(H) 2.52(H) -  Sodium 135 - 145 mmol/L 140 139 139  Potassium 3.5 - 5.1 mmol/L 5.1 5.1 5.4(H)  Chloride 98 - 111 mmol/L 103 105 -  CO2 22 - 32 mmol/L - 23 -  Calcium 8.9 - 10.3 mg/dL - 9.2 -   Hepatic Function Latest Ref Rng & Units 09/24/2014 02/25/2013 02/24/2013  Total Protein 6.5 - 8.1 g/dL  6.0(L) 6.0 4.7(L)  Albumin 3.5 - 5.0 g/dL 3.7 2.6(L) 2.4(L)  AST 15 - 41 U/L _1 ALT 17 - 63 U/L _2 Alk Phosphatase 38 - 126 U/L 48 75 61  Total Bilirubin 0.3 - 1.2 mg/dL 0.8 0.2(L) 0.5  Bilirubin, Direct 0.0 - 0.3 mg/dL - - -   CBC Latest Ref Rng & Units 11/07/2019 11/06/2019 11/06/2019  WBC 4.0 - 10.5 K/uL - - 14.5(H)  Hemoglobin 13.0 - 17.0 g/dL 9.2(L) 8.5(L) 9.0(L)  Hematocrit 39 - 52 % 27.0(L) 25.0(L) 28.9(L)  Platelets 150 - 400 K/uL - - 214   Lipid Panel     Component Value Date/Time   CHOL 109 01/03/2012 1305   TRIG 44.0 01/03/2012 1305   HDL 60.20 01/03/2012 1305   CHOLHDL 2 01/03/2012 1305   VLDL 8.8 01/03/2012 1305   LDLCALC 40 01/03/2012 1305   Cardiac Panel (last 3 results) No results for input(s): CKTOTAL, CKMB, TROPONINI, RELINDX in the last 72 hours.  HEMOGLOBIN A1C Lab Results  Component Value Date   HGBA1C 9.4 (H) 11/07/2019   MPG 223.08 11/07/2019   TSH Recent Labs    11/07/19 0241  TSH 3.370   Imaging: CT ABDOMEN  PELVIS WO CONTRAST  Result Date: 11/06/2019 CLINICAL DATA:  Bilateral leg swelling. EXAM: CT ABDOMEN AND PELVIS WITHOUT CONTRAST TECHNIQUE: Multidetector CT imaging of the abdomen and pelvis was performed following the standard protocol without IV contrast. COMPARISON:  Sep 10, 2009 FINDINGS: Lower chest: Mild to moderate severity atelectasis is seen within the posterior aspect of the right lung base. There is a very small right pleural effusion. Hepatobiliary: No focal liver abnormality is seen. No gallstones, gallbladder wall thickening, or biliary dilatation. Pancreas: Unremarkable. No pancreatic ductal dilatation or surrounding inflammatory changes. Spleen: Normal in size without focal abnormality. Adrenals/Urinary Tract: Adrenal glands are unremarkable. Kidneys are normal in size, without renal calculi or hydronephrosis. A 4.8 cm x 4.3 cm isodense soft tissue mass is seen within the posterior aspect of the mid to lower right kidney  (axial CT images 27 through 41, CT series number 3). This represents a new finding compared to the prior study. Bladder is unremarkable. Stomach/Bowel: There is a large gastric hernia. Appendix appears normal. No evidence of bowel dilatation. Noninflamed diverticula are seen within the ascending, descending and sigmoid colon. Vascular/Lymphatic: There is marked severity calcification of the abdominal aorta. No enlarged abdominal or pelvic lymph nodes. Reproductive: Prostate radiation implantation seeds are seen within a mildly enlarged prostate gland. Other: A 3.1 cm x 3.9 cm fat containing paraumbilical hernia is seen on the right. Musculoskeletal: Marked severity multilevel degenerative changes seen throughout the lumbar spine. This is most prominent at the level of L2-L3. IMPRESSION: 1. 4.8 cm x 4.3 cm isodense soft tissue mass within the posterior aspect of the mid to lower right kidney which represents a new finding compared to the prior study. This is concerning for the presence of a renal cell carcinoma. Further evaluation with MRI is recommended. 2. Large gastric hernia. 3. Colonic diverticulosis. 4. Fat containing paraumbilical hernia. 5. Prostate radiation implantation seeds within a mildly enlarged prostate gland. 6. Marked severity multilevel degenerative changes throughout the lumbar spine. This is most prominent at the level of L2-L3. 7. Very small right pleural effusion. 8. Aortic atherosclerosis. Aortic Atherosclerosis (ICD10-I70.0). Electronically Signed   By: Virgina Norfolk M.D.   On: 11/06/2019 20:23   DG Chest 2 View  Result Date: 11/06/2019 CLINICAL DATA:  Onset shortness of breath this morning. EXAM: CHEST - 2 VIEW COMPARISON:  Single-view of the chest 05/07/2017. FINDINGS: There is cardiomegaly and interstitial pulmonary edema with small bilateral pleural effusions. No pneumothorax. IMPRESSION: Cardiomegaly and interstitial edema. Electronically Signed   By: Inge Rise M.D.   On:  11/06/2019 12:15   US RENAL  Result Date: 11/06/2019 CLINICAL DATA:  Acute kidney injury. EXAM: RENAL / URINARY TRACT ULTRASOUND COMPLETE COMPARISON:  None. FINDINGS: Right Kidney: Renal measurements: 10.7 x 5.2 x 5.1 cm = volume: 150 mL. Increased echogenicity of renal parenchyma is noted suggesting medical renal disease. 3.8 cm complex abnormality is seen involving the lower pole which may represent neoplasm. 7 mm simple cyst is seen in midpole. No hydronephrosis visualized. Left Kidney: Renal measurements: 10.7 x 5.7 x 4.7 cm = volume: 150 mL. Increased echogenicity of renal parenchyma is noted suggesting medical renal disease. No mass or hydronephrosis visualized. Bladder: Appears normal for degree of bladder distention. Other: None. IMPRESSION: Increased echogenicity of renal parenchyma is noted bilaterally suggesting medical renal disease. No hydronephrosis or renal obstruction is noted. 3.8 cm complex rounded abnormality is seen involving the lower pole of the right kidney which may represent complex cyst, but possible neoplasm cannot be  excluded. Further evaluation with CT or MRI is recommended. Electronically Signed   By: Marijo Conception M.D.   On: 11/06/2019 16:30    Scheduled Meds: . amLODipine  5 mg Oral Daily  . aspirin EC  81 mg Oral Q supper  . brimonidine  1 drop Left Eye BID  . budesonide (PULMICORT) nebulizer solution  0.25 mg Nebulization BID  . Chlorhexidine Gluconate Cloth  6 each Topical Daily  . cholecalciferol  2,000 Units Oral Q supper  . donepezil  10 mg Oral Q supper  . escitalopram  5 mg Oral Q supper  . ferrous sulfate  325 mg Oral QPC supper  . finasteride  5 mg Oral Q supper  . furosemide  40 mg Intravenous TID  . heparin injection (subcutaneous)  5,000 Units Subcutaneous Q12H  . insulin aspart  0-9 Units Subcutaneous TID WC  . ipratropium-albuterol  3 mL Nebulization Q6H  . latanoprost  1 drop Both Eyes QHS  . loratadine  10 mg Oral Daily  . melatonin  5 mg Oral  QHS  . memantine  10 mg Oral BID  . mirabegron ER  50 mg Oral Q supper  . montelukast  10 mg Oral Daily  . multivitamin with minerals  1 tablet Oral Daily  . pantoprazole  40 mg Oral BID  . sodium chloride flush  3 mL Intravenous Q12H   Continuous Infusions: . sodium chloride    . cefTRIAXone (ROCEPHIN)  IV Stopped (11/07/19 0145)   PRN Meds:.sodium chloride, acetaminophen, albuterol, ondansetron (ZOFRAN) IV, sodium chloride flush   Cardiac Studies:    Echocardiogram 05/08/2017: - Left ventricle: There was mild concentric hypertrophy. Systolic  function was normal. The estimated ejection fraction was in the  range of 55% to 60%. Wall motion was normal; there were no  regional wall motion abnormalities. Doppler parameters are  consistent with abnormal left ventricular relaxation (grade 1 astolic dysfunction). Doppler parameters are consistent with  both elevated ventricular end-diastolic filling pressure and  elevated left atrial filling pressure.  - Mitral valve: Calcified annulus.  - Left atrium: The atrium was mildly dilated.  - Atrial septum: No defect or patent foramen ovale was identified.  EKG:  EKG 11/06/2019 at 11: 11 hours Marked sinus bradycardia at rate of 49 bpm, normal axis, normal QT interval.  No evident ischemia.   Tele: NSR  Assessment/Plan:  1.  Acute pulmonary edema and acute on chronic diastolic heart failure 2.  Acute renal failure superimposed on chronic stage III kidney disease 3.  Renal cell carcinoma right kidney, new finding by CT 4.  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  Left upper extremity edema and pain, suspect DVT  Recommendation: His BNP has improved, lungs are clear to auscultate today, leg edema is essentially resolved.  Renal function is slightly deteriorated due to aggressive diuresis, will reduce Lasix to 40 mg 3 times daily.  Follow-up on BMP and also BMP tomorrow and also repeat CBC in the morning to follow-up on  anemia.  Suspect drop in hemoglobin may have been dilutional.  Hyperkalemia has resolved.  Echocardiogram is pending.  In view of new renal cell carcinoma diagnosis, left upper extremity edema is probably related to underlying DVT, will obtain duplex.  We will also do stool guaiac to exclude GI bleed as he has iron deficiency anemia in view of possible need for anticoagulation.  He is presently 86 years of age, has multiple medical comorbidity, may not be a good candidate  for surgical intervention or nephrectomy and could potentially manage him conservatively but could get urology opinion as well.  I discussed with his son Dr. Berneta Sages.   Patient was previously on chronic prednisone 15 mg daily, I have restarted this back but will need to be weaned down as felt appropriate.    Adrian Prows, MD, [facility] 11/07/2019, 9:43 AM Office: [phone]

## 2019-11-07 NOTE — ED Notes (Signed)
Ordered breakfast--[name] 

## 2019-11-07 NOTE — Progress Notes (Signed)
PIV consult: pt in chair. RN will re-enter consult when back in bed.

## 2019-11-07 NOTE — Progress Notes (Signed)
PROGRESS NOTE    [name]  OAC:[id number] DOB: 07/19/1928 DOA: 11/06/2019 PCP: Leanna Battles, MD      Brief Narrative:  [name] is a 86 y.o. M with hx mod dementia, home dwelling, CAD s/p PCI 2005, HTN, COPD not on O2, DM, CKD IIIa baseline 1.2 per our records, OSA on CPAP and possible TIA who presented with few days of progressive SOB, leg/arm/face swelling.  In the ER, CXR showed edema and small effusion, K 6.2, Cr 2.3, Bicarb 19.  ECG with sinus bradycardia.  Also, lactate >3 and WBC 14K with UA suggesting UTI.  Cardiology and Pulmonology were consulted.  Patient started on IV Lasix for CHF, IV Rocephin for UTI.         Assessment & Plan:  Acute diastolic CHF Presented with SOB leg swelling, CXR with interstitial edema and BNP 477.  Admitted and started on IV Lasix  Net output 1700 last 12 hours, Cr up to 2.5, K improved. -Decrease Furosemide to 40 mg IV TID -Monitor K no suppl now -Strict I/Os, daily weights, telemetry  -Daily monitoring renal function -Follow echo     Acute renal failure on presumed CKD IIIa Hyperkalemia Acute metabolic acidosis UA showed pyuria, no RBCs. Renal US showed no hydronephrosis. Cr worsening with diuresis overnight. K improved with Lokelma, Lasix. -Hold ARB (this was on hold prior to admission) -Trend BMP -Strict I/Os  Anemia, microcytic Ferritin low normal, TIBC, iron sat and sat ratio all low.  No clear history GI blood loss.  RI low/hypoproliferative. -Continue iron  -Hold Plavix  Bradycardia Sinus in setting of AKI, BB use, hyperkalemia -Hold nebivolol -Consult Cardiology, appreciate cares  UTI Presented with leukocytosis, pyuria. -Continue Rocephin -Urine culture pending  Renal mass 4.8 x 4.3 cm soft tissue mass noted incidentally in the RIGHT kidney.  Will need contrasted study. -Obtain MRI abdomen with and without gad when GFR improves  COPD OSA -Continue loratadine, montelukast -Continue Duonebs,  scheduled -Continue Pulmicort -Hold home Trelegy -Continue CPAP at night -Continue prednisone, stress dosing for 1 more day  Vascular disease, secondary prevention Hypertension BP elevated overnight -Hold telmisartan -Continue low dose aspirin -Stop hydralazine, Imdur -Resume amlodipine -Hold clopidogrel  Diabetes A1c 9.4%. Gulcoses here well controlled to low -Hold sitagliptin-metformin -Continue sensitive SS correction insulin  Dementia -Continue donepezil, memantine, escitalopram  BPH LUTS -Continue mirabegron -Continue finasteride  GERD -Continue PPI   Arm swelling This developed prior to admission, was seen by orthopedics, fracture ruled out, suspected olecranon bursitis, got 1 dose of Bactrim before admission.  I suspect this is a cellulitis with associated olecranon bursitis. -Reasonable to obtain US upper extremity  -Continue ceftriaxone -Add doxycycline       Disposition: Status is: Inpatient  Remains inpatient appropriate because:he has persistent encephalopathy, requires ongoing IV antibiotics and IV lasix   Dispo: The patient is from: Home              Anticipated d/c is to: TBD              Anticipated d/c date is: 3 days              Patient currently is not medically stable to d/c.              MDM: The below labs and imaging reports were reviewed and summarized above.  Medication management as above.  The patietn has persistent neurological changes   DVT prophylaxis: heparin injection 5,000 Units Start: 11/06/19 1530  Code  Status: DNR Family Communication: Daughter at bedside    Consultants:   Cardiology  Pulmonology  Orthopedic  Procedures:   7/7 renal ultrasound --no hydronephrosis  7/7 CT abdomen and pelvis --renal mass noted, no other abnormalities  Antimicrobials:   Ceftriaxone 7/7 >>   Culture data:   7/8 urine culture pending          Subjective: Still mildly confused, still some  auditory hallucination of "babies crying".  Has worsening left forearm swelling.  No fever.  No respiratory distress.  Still severe generalized weakness.  No abdominal pain      Objective: Vitals:   11/07/19 0300 11/07/19 0429 11/07/19 0636 11/07/19 0805  BP: 139/62  135/79 132/88  Pulse: (!) 49  (!) 52 (!) 55  Resp: 10  12 17   Temp:   (!) 97.3 F (36.3 C) 97.6 F (36.4 C)  TempSrc:   Oral Axillary  SpO2: 99% 100% 99% 100%  Weight:   81.7 kg   Height:        Intake/Output Summary (Last 24 hours) at 11/07/2019 1032 Last data filed at 11/07/2019 0703 Gross per 24 hour  Intake 50 ml  Output 1750 ml  Net -1700 ml   Filed Weights   11/06/19 1122 11/07/19 0636  Weight: 75 kg 81.7 kg    Examination: General appearance: Elderly adult male, awake, interactive, and in no obvious distress.   HEENT: Anicteric, conjunctiva pink, lids and lashes normal. No nasal deformity, discharge, epistaxis.  Lips moist, dentition with some edentulism, oropharynx moist, no oral lesions, hearing diminished.   Skin: Warm and dry.  Redness and swelling of the left forearm, obvious olecranon bursitis left arm.  Cardiac: RRR, nl S1-S2, no murmurs appreciated.  Capillary refill is brisk.  JVP not visible.  1+ bilateral LE edema.  Radial pulses 2+ and symmetric. Respiratory: Normal respiratory rate and rhythm.  CTAB without rales or wheezes. Abdomen: Abdomen soft.  No TTP or guarding. No ascites, distension, hepatosplenomegaly.   MSK: No deformities or effusions. Neuro: Awake and interactive, but mild psychomotor slowing.  Pupils equal but small..  EOMI, moves all extremities with severe generalized weakness. Speech fluent.    Psych: Sensorium intact and responding to questions, attention diminished. Affect blunted.  Judgment and insight appear moderately impaired.    Data Reviewed: I have personally reviewed following labs and imaging studies:  CBC: Recent Labs  Lab 11/06/19 1126 11/06/19 1833  11/07/19 0241 11/07/19 0245  WBC 14.5*  --  11.5*  --   HGB 9.0* 8.5* 8.7* 9.2*  HCT 28.9* 25.0* 28.3* 27.0*  MCV 79.8*  --  78.6*  --   PLT 214  --  214  --    Basic Metabolic Panel: Recent Labs  Lab 11/06/19 1126 11/06/19 1420 11/06/19 1833 11/07/19 0241 11/07/19 0245  NA 137  --  139 139 140  K 6.2* 6.4* 5.4* 5.1 5.1  CL 106  --   --  105 103  CO2 19*  --   --  23  --   GLUCOSE 190*  --   --  123* 119*  BUN 40*  --   --  36* 37*  CREATININE 2.32*  --   --  2.52* 2.70*  CALCIUM 9.0  --   --  9.2  --    GFR: Estimated Creatinine Clearance: 19.3 mL/min (A) (by C-G formula based on SCr of 2.7 mg/dL (H)). Liver Function Tests: No results for input(s): AST, ALT, ALKPHOS, BILITOT, PROT, ALBUMIN  in the last 168 hours. No results for input(s): LIPASE, AMYLASE in the last 168 hours. No results for input(s): AMMONIA in the last 168 hours. Coagulation Profile: No results for input(s): INR, PROTIME in the last 168 hours. Cardiac Enzymes: No results for input(s): CKTOTAL, CKMB, CKMBINDEX, TROPONINI in the last 168 hours. BNP (last 3 results) No results for input(s): PROBNP in the last 8760 hours. HbA1C: Recent Labs    11/07/19 0241  HGBA1C 9.4*   CBG: Recent Labs  Lab 11/06/19 1738 11/06/19 1821 11/06/19 2229 11/07/19 0050 11/07/19 0649  GLUCAP 61* 118* 115* 116* 108*   Lipid Profile: No results for input(s): CHOL, HDL, LDLCALC, TRIG, CHOLHDL, LDLDIRECT in the last 72 hours. Thyroid Function Tests: Recent Labs    11/07/19 0241  TSH 3.370   Anemia Panel: Recent Labs    11/07/19 0241  FERRITIN 60  TIBC 295  IRON 38*  RETICCTPCT 1.7   Urine analysis:    Component Value Date/Time   COLORURINE YELLOW 11/06/2019 1933   APPEARANCEUR HAZY (A) 11/06/2019 1933   LABSPEC 1.021 11/06/2019 1933   PHURINE 5.0 11/06/2019 1933   GLUCOSEU 50 (A) 11/06/2019 1933   HGBUR NEGATIVE 11/06/2019 1933   BILIRUBINUR NEGATIVE 11/06/2019 1933   KETONESUR NEGATIVE 11/06/2019  1933   PROTEINUR 100 (A) 11/06/2019 1933   UROBILINOGEN 0.2 09/24/2014 1442   NITRITE NEGATIVE 11/06/2019 1933   LEUKOCYTESUR LARGE (A) 11/06/2019 1933   Sepsis Labs: @LABRCNTIP (procalcitonin:4,lacticacidven:4)  ) Recent Results (from the past 240 hour(s))  SARS Coronavirus 2 by RT PCR (hospital order, performed in [place] hospital lab) Nasopharyngeal Nasopharyngeal Swab     Status: None   Collection Time: 11/06/19  2:34 PM   Specimen: Nasopharyngeal Swab  Result Value Ref Range Status   SARS Coronavirus 2 NEGATIVE NEGATIVE Final    Comment: (NOTE) SARS-CoV-2 target nucleic acids are NOT DETECTED.  The SARS-CoV-2 RNA is generally detectable in upper and lower respiratory specimens during the acute phase of infection. The lowest concentration of SARS-CoV-2 viral copies this assay can detect is 250 copies / mL. A negative result does not preclude SARS-CoV-2 infection and should not be used as the sole basis for treatment or other patient management decisions.  A negative result may occur with improper specimen collection / handling, submission of specimen other than nasopharyngeal swab, presence of viral mutation(s) within the areas targeted by this assay, and inadequate number of viral copies (<250 copies / mL). A negative result must be combined with clinical observations, patient history, and epidemiological information.  Fact Sheet for Patients:   StrictlyIdeas.no  Fact Sheet for Healthcare Providers: BankingDealers.co.za  This test is not yet approved or  cleared by the [place] FDA and has been authorized for detection and/or diagnosis of SARS-CoV-2 by FDA under an Emergency Use Authorization (EUA).  This EUA will remain in effect (meaning this test can be used) for the duration of the COVID-19 declaration under Section 564(b)(1) of the Act, 21 U.S.C. section 360bbb-3(b)(1), unless the authorization is terminated  or revoked sooner.  Performed at [facility], [place] [address] [place], [place] [address]          Radiology Studies: CT ABDOMEN PELVIS WO CONTRAST  Result Date: 11/06/2019 CLINICAL DATA:  Bilateral leg swelling. EXAM: CT ABDOMEN AND PELVIS WITHOUT CONTRAST TECHNIQUE: Multidetector CT imaging of the abdomen and pelvis was performed following the standard protocol without IV contrast. COMPARISON:  Sep 10, 2009 FINDINGS: Lower chest: Mild to moderate severity atelectasis  is seen within the posterior aspect of the right lung base. There is a very small right pleural effusion. Hepatobiliary: No focal liver abnormality is seen. No gallstones, gallbladder wall thickening, or biliary dilatation. Pancreas: Unremarkable. No pancreatic ductal dilatation or surrounding inflammatory changes. Spleen: Normal in size without focal abnormality. Adrenals/Urinary Tract: Adrenal glands are unremarkable. Kidneys are normal in size, without renal calculi or hydronephrosis. A 4.8 cm x 4.3 cm isodense soft tissue mass is seen within the posterior aspect of the mid to lower right kidney (axial CT images 27 through 41, CT series number 3). This represents a new finding compared to the prior study. Bladder is unremarkable. Stomach/Bowel: There is a large gastric hernia. Appendix appears normal. No evidence of bowel dilatation. Noninflamed diverticula are seen within the ascending, descending and sigmoid colon. Vascular/Lymphatic: There is marked severity calcification of the abdominal aorta. No enlarged abdominal or pelvic lymph nodes. Reproductive: Prostate radiation implantation seeds are seen within a mildly enlarged prostate gland. Other: A 3.1 cm x 3.9 cm fat containing paraumbilical hernia is seen on the right. Musculoskeletal: Marked severity multilevel degenerative changes seen throughout the lumbar spine. This is most prominent at the level of L2-L3. IMPRESSION: 1. 4.8 cm x 4.3 cm isodense soft tissue mass within  the posterior aspect of the mid to lower right kidney which represents a new finding compared to the prior study. This is concerning for the presence of a renal cell carcinoma. Further evaluation with MRI is recommended. 2. Large gastric hernia. 3. Colonic diverticulosis. 4. Fat containing paraumbilical hernia. 5. Prostate radiation implantation seeds within a mildly enlarged prostate gland. 6. Marked severity multilevel degenerative changes throughout the lumbar spine. This is most prominent at the level of L2-L3. 7. Very small right pleural effusion. 8. Aortic atherosclerosis. Aortic Atherosclerosis (ICD10-I70.0). Electronically Signed   By: Virgina Norfolk M.D.   On: 11/06/2019 20:23   DG Chest 2 View  Result Date: 11/06/2019 CLINICAL DATA:  Onset shortness of breath this morning. EXAM: CHEST - 2 VIEW COMPARISON:  Single-view of the chest 05/07/2017. FINDINGS: There is cardiomegaly and interstitial pulmonary edema with small bilateral pleural effusions. No pneumothorax. IMPRESSION: Cardiomegaly and interstitial edema. Electronically Signed   By: Inge Rise M.D.   On: 11/06/2019 12:15   US RENAL  Result Date: 11/06/2019 CLINICAL DATA:  Acute kidney injury. EXAM: RENAL / URINARY TRACT ULTRASOUND COMPLETE COMPARISON:  None. FINDINGS: Right Kidney: Renal measurements: 10.7 x 5.2 x 5.1 cm = volume: 150 mL. Increased echogenicity of renal parenchyma is noted suggesting medical renal disease. 3.8 cm complex abnormality is seen involving the lower pole which may represent neoplasm. 7 mm simple cyst is seen in midpole. No hydronephrosis visualized. Left Kidney: Renal measurements: 10.7 x 5.7 x 4.7 cm = volume: 150 mL. Increased echogenicity of renal parenchyma is noted suggesting medical renal disease. No mass or hydronephrosis visualized. Bladder: Appears normal for degree of bladder distention. Other: None. IMPRESSION: Increased echogenicity of renal parenchyma is noted bilaterally suggesting medical renal  disease. No hydronephrosis or renal obstruction is noted. 3.8 cm complex rounded abnormality is seen involving the lower pole of the right kidney which may represent complex cyst, but possible neoplasm cannot be excluded. Further evaluation with CT or MRI is recommended. Electronically Signed   By: Marijo Conception M.D.   On: 11/06/2019 16:30        Scheduled Meds: . amLODipine  5 mg Oral Daily  . aspirin EC  81 mg Oral Q supper  .  brimonidine  1 drop Left Eye BID  . budesonide (PULMICORT) nebulizer solution  0.25 mg Nebulization BID  . Chlorhexidine Gluconate Cloth  6 each Topical Daily  . cholecalciferol  2,000 Units Oral Q supper  . donepezil  10 mg Oral Q supper  . escitalopram  5 mg Oral Q supper  . ferrous sulfate  325 mg Oral QPC supper  . finasteride  5 mg Oral Q supper  . furosemide  40 mg Intravenous TID  . heparin injection (subcutaneous)  5,000 Units Subcutaneous Q12H  . insulin aspart  0-9 Units Subcutaneous TID WC  . ipratropium-albuterol  3 mL Nebulization Q6H  . latanoprost  1 drop Both Eyes QHS  . loratadine  10 mg Oral Daily  . melatonin  5 mg Oral QHS  . memantine  10 mg Oral BID  . mirabegron ER  50 mg Oral Q supper  . montelukast  10 mg Oral Daily  . multivitamin with minerals  1 tablet Oral Daily  . pantoprazole  40 mg Oral BID  . sodium chloride flush  3 mL Intravenous Q12H   Continuous Infusions: . sodium chloride    . cefTRIAXone (ROCEPHIN)  IV Stopped (11/07/19 0145)     LOS: 1 day    Time spent: 35 minutes    Edwin Dada, MD [facility] 11/07/2019, 10:32 AM     Please page though [place] or Epic secure chat:  For [employer], Adult nurse

## 2019-11-07 NOTE — Progress Notes (Signed)
Left arm has splint and ace wrap wrapped around it. Patient unable to tolerate it. C/O of too tight and painful Left hand has increase in swelling since beginning of shift. 2 + radial warm and dry. Removed lower ace wrap. And gave 2 Tylenol and ice pack to left arm and elevated up on pillow

## 2019-11-07 NOTE — Progress Notes (Signed)
°  Echocardiogram 2D Echocardiogram has been performed.  [name] M 11/07/2019, 1:51 PM

## 2019-11-07 NOTE — ED Notes (Signed)
Pt awake with noise good urine output

## 2019-11-07 NOTE — Progress Notes (Signed)
Orthopedic Tech Progress Note Patient Details:  [name] 12/06/1928 [id number] Went to put long arm splint back on patient. Splint was removed for [place] by other staff. Patient ID: [name], male   DOB: 1928/08/29, 86 y.o.   MRN: [id number]   [name] 11/07/2019, 8:41 PM

## 2019-11-07 NOTE — Progress Notes (Signed)
CRITICAL VALUE ALERT  Critical Value:  Body fluid culture. Abundant wbc present. Both bmn and moconuclear rare gram positive cocci. Date & Time Notied: 11/07/19 @ 1740  Provider Notified: Harrell Gave  Orders Received/Actions taken: N/A

## 2019-11-07 NOTE — Consult Note (Signed)
Reason for Consult:Left arm pain Referring Physician: C Danford  [name] is an 86 y.o. male.  HPI: [name] was admitted yesterday with SOB. He fell earlier this week and was Robert/o left elbow/arm pain. He was taken to [facility] urgent care and seen by Dr. Oneita Kras who ruled out fx but apparently thought he had septic bursitis and gave him Septra. He only took 1 dose of that as he has a sulfa allergy. Orthopedic surgery was consulted today to evaluate the evident bursitis. He Robert/o some proximal FA pain, esp on volar aspect.  Past Medical History:  Diagnosis Date   Anemia    Asthma    Benign prostatic hypertrophy    Bilateral sensorineural hearing loss    CAD (coronary artery disease) 07/2003   a.  3.0 x 28 mm Taxus DES in the mid RCA in 2005   COPD (chronic obstructive pulmonary disease) (HCC)    Diverticulosis    DJD (degenerative joint disease)    DM (diabetes mellitus) (HCC)    Dyslipidemia    GERD (gastroesophageal reflux disease)    Hemorrhoids    Hiatal hernia    Hypertension    OSA (obstructive sleep apnea)    cpap use   Osteoarthritis    Overactive bladder    Rhinitis    Seasonal allergies    Shingles    Stroke [facility])    TIA (transient ischemic attack) 12/2006   Dr. Leonie Man    Past Surgical History:  Procedure Laterality Date   BACK SURGERY     lower back   Cataract surgery     1 eye done.   CORONARY ANGIOPLASTY WITH STENT PLACEMENT  07/2003   Est. EF of 65% with stent to the RCA, by Dr. Acie Fredrickson   CYSTOSCOPY WITH INSERTION OF UROLIFT N/A 02/25/2016   Procedure: CYSTOSCOPY WITH INSERTION OF UROLIFT x6;  Surgeon: Cleon Gustin, MD;  Location: [facility];  Service: Urology;  Laterality: N/A;  1 HOUR     INNER EAR SURGERY     JOINT REPLACEMENT Right    L5 laminectomy  2004   Dr. Sherwood Gambler   right total knee  2006   Dr. Maureen Ralphs    Family History  Problem Relation Age of Onset   Lung disease Father    Asthma Father    Sudden death Mother         unknown causes   Lung disease Brother    Asthma Brother    Sudden death Brother 61       unknown causes   Sudden death Sister        unknown causes   Sudden death Sister        unknown causes    Social History:  reports that he quit smoking about 31 years ago. His smoking use included cigarettes. He has a 43.00 pack-year smoking history. He has never used smokeless tobacco. He reports current alcohol use. He reports that he does not use drugs.  Allergies:  Allergies  Allergen Reactions   Ciprofloxacin Hcl Other (See Comments)    Unknown reaction   Mirabegron Other (See Comments)    FATIGUE   Penicillins     Has patient had a PCN reaction causing immediate rash, facial/tongue/throat swelling, SOB or lightheadedness with hypotension: Unknown Has patient had a PCN reaction causing severe rash involving mucus membranes or skin necrosis: Unknown Has patient had a PCN reaction that required hospitalization Unknown Has patient had a PCN reaction occurring within the last  10 years: Unknown If all of the above answers are "NO", then may proceed with Cephalosporin use.    Sulfa Antibiotics Hives and Rash   Sulfamethoxazole Rash   Sulfonamide Derivatives Rash    Medications: I have reviewed the patient's current medications.  Results for orders placed or performed during the hospital encounter of 11/06/19 (from the past 48 hour(s))  Basic metabolic panel     Status: Abnormal   Collection Time: 11/06/19 11:26 AM  Result Value Ref Range   Sodium 137 135 - 145 mmol/L   Potassium 6.2 (H) 3.5 - 5.1 mmol/L   Chloride 106 98 - 111 mmol/L   CO2 19 (L) 22 - 32 mmol/L   Glucose, Bld 190 (H) 70 - 99 mg/dL    Comment: Glucose reference range applies only to samples taken after fasting for at least 8 hours.   BUN 40 (H) 8 - 23 mg/dL   Creatinine, Ser 2.32 (H) 0.61 - 1.24 mg/dL   Calcium 9.0 8.9 - 10.3 mg/dL   GFR calc non Af Amer 24 (L) >60 mL/min   GFR calc Af Amer 28 (L)  >60 mL/min   Anion gap 12 5 - 15    Comment: Performed at [facility] [address] [place], [place] [address]  CBC     Status: Abnormal   Collection Time: 11/06/19 11:26 AM  Result Value Ref Range   WBC 14.5 (H) 4.0 - 10.5 K/uL   RBC 3.62 (L) 4.22 - 5.81 MIL/uL   Hemoglobin 9.0 (L) 13.0 - 17.0 g/dL   HCT 28.9 (L) 39 - 52 %   MCV 79.8 (L) 80.0 - 100.0 fL   MCH 24.9 (L) 26.0 - 34.0 pg   MCHC 31.1 30.0 - 36.0 g/dL   RDW 17.7 (H) 11.5 - 15.5 %   Platelets 214 150 - 400 K/uL   nRBC 0.1 0.0 - 0.2 %    Comment: Performed at [facility] [address] [place], [place] [address]  Troponin I (High Sensitivity)     Status: None   Collection Time: 11/06/19 11:26 AM  Result Value Ref Range   Troponin I (High Sensitivity) 11 <18 ng/L    Comment: (NOTE) Elevated high sensitivity troponin I (hsTnI) values and significant  changes across serial measurements may suggest ACS but many other  chronic and acute conditions are known to elevate hsTnI results.  Refer to the "Links" section for chest pain algorithms and additional  guidance. Performed at [facility], [place] [address] [place], [place] [address]   Lactic acid, plasma     Status: Abnormal   Collection Time: 11/06/19 11:26 AM  Result Value Ref Range   Lactic Acid, Venous 3.5 (HH) 0.5 - 1.9 mmol/L    Comment: CRITICAL RESULT CALLED TO, READ BACK BY AND VERIFIED WITH: T.SHROPSHRRE,RN @1237  11/06/19 [email or web address].J Performed at [facility], [place] [address] [place], [place] [address]   Brain natriuretic peptide     Status: Abnormal   Collection Time: 11/06/19 11:26 AM  Result Value Ref Range   B Natriuretic Peptide 477.3 (H) 0.0 - 100.0 pg/mL    Comment: Performed at [facility] [address] [place], [place] [address]  Lactic acid, plasma     Status: None   Collection Time: 11/06/19  2:15 PM  Result Value Ref Range   Lactic Acid, Venous 1.9 0.5 - 1.9 mmol/L    Comment: Performed at [facility], [address] [place], [place] [address]  Troponin I (High Sensitivity)     Status: None   Collection Time: 11/06/19  2:20 PM  Result Value Ref Range   Troponin I (High Sensitivity) 12 <18 ng/L    Comment: (NOTE) Elevated high sensitivity troponin I (hsTnI) values and significant  changes across serial measurements may suggest ACS but many other  chronic and acute conditions are known to elevate hsTnI results.  Refer to the Links section for chest pain algorithms and additional  guidance. Performed at [facility], [place] [address], [place], [place] [address]   Potassium     Status: Abnormal   Collection Time: 11/06/19  2:20 PM  Result Value Ref Range   Potassium 6.4 (HH) 3.5 - 5.1 mmol/L    Comment: CRITICAL RESULT CALLED TO, READ BACK BY AND VERIFIED WITH: B.MICHAELSON,RN @1505  11/06/19 VANG.J Performed at [facility], [place] [address] [place], [place] [address]   SARS Coronavirus 2 by RT PCR (hospital order, performed in [facility] hospital lab) Nasopharyngeal Nasopharyngeal Swab     Status: None   Collection Time: 11/06/19  2:34 PM   Specimen: Nasopharyngeal Swab  Result Value Ref Range   SARS Coronavirus 2 NEGATIVE NEGATIVE    Comment: (NOTE) SARS-CoV-2 target nucleic acids are NOT DETECTED.  The SARS-CoV-2 RNA is generally detectable in upper and lower respiratory specimens during the acute phase of infection. The lowest concentration of SARS-CoV-2 viral copies this assay can detect is 250 copies / mL. A negative result does not preclude SARS-CoV-2 infection and should not be used as the sole basis for treatment or other patient management decisions.  A negative result may occur with improper specimen collection / handling, submission of specimen other than nasopharyngeal swab, presence of viral mutation(s) within the areas targeted by this assay, and inadequate number of viral copies (<250 copies / mL). A negative result must be combined with  clinical observations, patient history, and epidemiological information.  Fact Sheet for Patients:   StrictlyIdeas.no  Fact Sheet for Healthcare Providers: BankingDealers.co.za  This test is not yet approved or  cleared by the [place] FDA and has been authorized for detection and/or diagnosis of SARS-CoV-2 by FDA under an Emergency Use Authorization (EUA).  This EUA will remain in effect (meaning this test can be used) for the duration of the COVID-19 declaration under Section 564(b)(1) of the Act, 21 [place] section 360bbb-3(b)(1), unless the authorization is terminated or revoked sooner.  Performed at [facility], [place] [address], [place],  [address]   CBG monitoring, ED     Status: Abnormal   Collection Time: 11/06/19  5:38 PM  Result Value Ref Range   Glucose-Capillary 61 (L) 70 - 99 mg/dL    Comment: Glucose reference range applies only to samples taken after fasting for at least 8 hours.  CBG monitoring, ED     Status: Abnormal   Collection Time: 11/06/19  6:21 PM  Result Value Ref Range   Glucose-Capillary 118 (H) 70 - 99 mg/dL    Comment: Glucose reference range applies only to samples taken after fasting for at least 8 hours.  I-Stat arterial blood gas, [facility] ED only)     Status: Abnormal   Collection Time: 11/06/19  6:33 PM  Result Value Ref Range   pH, Arterial 7.369 7.35 - 7.45   pCO2 arterial 38.4 32 - 48 mmHg   pO2, Arterial 78 (L) 83 - 108 mmHg  Bicarbonate 22.1 20.0 - 28.0 mmol/L   TCO2 23 22 - 32 mmol/L   O2 Saturation 95.0 %   Acid-base deficit 3.0 (H) 0.0 - 2.0 mmol/L   Sodium 139 135 - 145 mmol/L   Potassium 5.4 (H) 3.5 - 5.1 mmol/L   Calcium, Ion 1.30 1.15 - 1.40 mmol/L   HCT 25.0 (L) 39 - 52 %   Hemoglobin 8.5 (L) 13.0 - 17.0 g/dL   Sample type ARTERIAL   Urinalysis, Routine w reflex microscopic     Status: Abnormal   Collection Time: 11/06/19  7:33 PM  Result Value Ref Range   Color,  Urine YELLOW YELLOW   APPearance HAZY (A) CLEAR   Specific Gravity, Urine 1.021 1.005 - 1.030   pH 5.0 5.0 - 8.0   Glucose, UA 50 (A) NEGATIVE mg/dL   Hgb urine dipstick NEGATIVE NEGATIVE   Bilirubin Urine NEGATIVE NEGATIVE   Ketones, ur NEGATIVE NEGATIVE mg/dL   Protein, ur 100 (A) NEGATIVE mg/dL   Nitrite NEGATIVE NEGATIVE   Leukocytes,Ua LARGE (A) NEGATIVE   RBC / HPF 0-5 0 - 5 RBC/hpf   WBC, UA >50 (H) 0 - 5 WBC/hpf   Bacteria, UA RARE (A) NONE SEEN   Mucus PRESENT    Hyaline Casts, UA PRESENT     Comment: Performed at [facility], [address] [place], [place] [address]  CBG monitoring, ED     Status: Abnormal   Collection Time: 11/06/19 10:29 PM  Result Value Ref Range   Glucose-Capillary 115 (H) 70 - 99 mg/dL    Comment: Glucose reference range applies only to samples taken after fasting for at least 8 hours.  CBG monitoring, ED     Status: Abnormal   Collection Time: 11/07/19 12:50 AM  Result Value Ref Range   Glucose-Capillary 116 (H) 70 - 99 mg/dL    Comment: Glucose reference range applies only to samples taken after fasting for at least 8 hours.  Basic metabolic panel     Status: Abnormal   Collection Time: 11/07/19  2:41 AM  Result Value Ref Range   Sodium 139 135 - 145 mmol/L   Potassium 5.1 3.5 - 5.1 mmol/L   Chloride 105 98 - 111 mmol/L   CO2 23 22 - 32 mmol/L   Glucose, Bld 123 (H) 70 - 99 mg/dL    Comment: Glucose reference range applies only to samples taken after fasting for at least 8 hours.   BUN 36 (H) 8 - 23 mg/dL   Creatinine, Ser 2.52 (H) 0.61 - 1.24 mg/dL   Calcium 9.2 8.9 - 10.3 mg/dL   GFR calc non Af Amer 22 (L) >60 mL/min   GFR calc Af Amer 25 (L) >60 mL/min   Anion gap 11 5 - 15    Comment: Performed at [facility] [address] [place], [place] [address]  Iron and TIBC     Status: Abnormal   Collection Time: 11/07/19  2:41 AM  Result Value Ref Range   Iron 38 (L) 45 - 182 ug/dL   TIBC 295 250 - 450 ug/dL   Saturation  Ratios 13 (L) 17.9 - 39.5 %   UIBC 257 ug/dL    Comment: Performed at [facility], [place] [address] [place], [place] [address]  Ferritin     Status: None   Collection Time: 11/07/19  2:41 AM  Result Value Ref Range   Ferritin 60 24 - 336 ng/mL  Comment: Performed at [facility], [place] [address] [place], [place] [address]  Hemoglobin A1c     Status: Abnormal   Collection Time: 11/07/19  2:41 AM  Result Value Ref Range   Hgb A1c MFr Bld 9.4 (H) 4.8 - 5.6 %    Comment: (NOTE) Pre diabetes:          5.7%-6.4%  Diabetes:              >6.4%  Glycemic control for   <7.0% adults with diabetes    Mean Plasma Glucose 223.08 mg/dL    Comment: Performed at [facility] [address], [place], [place] [address]  Reticulocytes     Status: Abnormal   Collection Time: 11/07/19  2:41 AM  Result Value Ref Range   Retic Ct Pct 1.7 0.4 - 3.1 %   RBC. 3.62 (L) 4.22 - 5.81 MIL/uL   Retic Count, Absolute 60.5 19.0 - 186.0 K/uL   Immature Retic Fract 23.9 (H) 2.3 - 15.9 %    Comment: Performed at [facility] [address], [place], [place] [address]  Brain natriuretic peptide     Status: Abnormal   Collection Time: 11/07/19  2:41 AM  Result Value Ref Range   B Natriuretic Peptide 377.8 (H) 0.0 - 100.0 pg/mL    Comment: Performed at [facility] [address] [place], [place] [address]  TSH     Status: None   Collection Time: 11/07/19  2:41 AM  Result Value Ref Range   TSH 3.370 0.350 - 4.500 uIU/mL    Comment: Performed by a 3rd Generation assay with a functional sensitivity of <=0.01 uIU/mL. Performed at [facility], [place] [address], [place], [place] [address]   CBC     Status: Abnormal   Collection Time: 11/07/19  2:41 AM  Result Value Ref Range   WBC 11.5 (H) 4.0 - 10.5 K/uL   RBC 3.60 (L) 4.22 - 5.81 MIL/uL   Hemoglobin 8.7 (L) 13.0 - 17.0 g/dL   HCT 28.3 (L) 39 - 52 %   MCV 78.6 (L) 80.0 - 100.0 fL   MCH 24.2 (L) 26.0 - 34.0 pg   MCHC  30.7 30.0 - 36.0 g/dL   RDW 17.5 (H) 11.5 - 15.5 %   Platelets 214 150 - 400 K/uL   nRBC 0.2 0.0 - 0.2 %    Comment: Performed at [facility], [place] [address], [place], [place] [address]  I-stat chem 8, ed     Status: Abnormal   Collection Time: 11/07/19  2:45 AM  Result Value Ref Range   Sodium 140 135 - 145 mmol/L   Potassium 5.1 3.5 - 5.1 mmol/L   Chloride 103 98 - 111 mmol/L   BUN 37 (H) 8 - 23 mg/dL   Creatinine, Ser 2.70 (H) 0.61 - 1.24 mg/dL   Glucose, Bld 119 (H) 70 - 99 mg/dL    Comment: Glucose reference range applies only to samples taken after fasting for at least 8 hours.   Calcium, Ion 1.23 1.15 - 1.40 mmol/L   TCO2 25 22 - 32 mmol/L   Hemoglobin 9.2 (L) 13.0 - 17.0 g/dL   HCT 27.0 (L) 39 - 52 %  Glucose, capillary     Status: Abnormal   Collection Time: 11/07/19  6:49 AM  Result Value Ref Range   Glucose-Capillary 108 (H) 70 - 99 mg/dL    Comment: Glucose reference range  applies only to samples taken after fasting for at least 8 hours.    CT ABDOMEN PELVIS WO CONTRAST  Result Date: 11/06/2019 CLINICAL DATA:  Bilateral leg swelling. EXAM: CT ABDOMEN AND PELVIS WITHOUT CONTRAST TECHNIQUE: Multidetector CT imaging of the abdomen and pelvis was performed following the standard protocol without IV contrast. COMPARISON:  Sep 10, 2009 FINDINGS: Lower chest: Mild to moderate severity atelectasis is seen within the posterior aspect of the right lung base. There is a very small right pleural effusion. Hepatobiliary: No focal liver abnormality is seen. No gallstones, gallbladder wall thickening, or biliary dilatation. Pancreas: Unremarkable. No pancreatic ductal dilatation or surrounding inflammatory changes. Spleen: Normal in size without focal abnormality. Adrenals/Urinary Tract: Adrenal glands are unremarkable. Kidneys are normal in size, without renal calculi or hydronephrosis. A 4.8 cm x 4.3 cm isodense soft tissue mass is seen within the posterior aspect of the mid to lower  right kidney (axial CT images 27 through 41, CT series number 3). This represents a new finding compared to the prior study. Bladder is unremarkable. Stomach/Bowel: There is a large gastric hernia. Appendix appears normal. No evidence of bowel dilatation. Noninflamed diverticula are seen within the ascending, descending and sigmoid colon. Vascular/Lymphatic: There is marked severity calcification of the abdominal aorta. No enlarged abdominal or pelvic lymph nodes. Reproductive: Prostate radiation implantation seeds are seen within a mildly enlarged prostate gland. Other: A 3.1 cm x 3.9 cm fat containing paraumbilical hernia is seen on the right. Musculoskeletal: Marked severity multilevel degenerative changes seen throughout the lumbar spine. This is most prominent at the level of L2-L3. IMPRESSION: 1. 4.8 cm x 4.3 cm isodense soft tissue mass within the posterior aspect of the mid to lower right kidney which represents a new finding compared to the prior study. This is concerning for the presence of a renal cell carcinoma. Further evaluation with MRI is recommended. 2. Large gastric hernia. 3. Colonic diverticulosis. 4. Fat containing paraumbilical hernia. 5. Prostate radiation implantation seeds within a mildly enlarged prostate gland. 6. Marked severity multilevel degenerative changes throughout the lumbar spine. This is most prominent at the level of L2-L3. 7. Very small right pleural effusion. 8. Aortic atherosclerosis. Aortic Atherosclerosis (ICD10-I70.0). Electronically Signed   By: Virgina Norfolk M.D.   On: 11/06/2019 20:23   DG Chest 2 View  Result Date: 11/06/2019 CLINICAL DATA:  Onset shortness of breath this morning. EXAM: CHEST - 2 VIEW COMPARISON:  Single-view of the chest 05/07/2017. FINDINGS: There is cardiomegaly and interstitial pulmonary edema with small bilateral pleural effusions. No pneumothorax. IMPRESSION: Cardiomegaly and interstitial edema. Electronically Signed   By: Inge Rise  M.D.   On: 11/06/2019 12:15   US RENAL  Result Date: 11/06/2019 CLINICAL DATA:  Acute kidney injury. EXAM: RENAL / URINARY TRACT ULTRASOUND COMPLETE COMPARISON:  None. FINDINGS: Right Kidney: Renal measurements: 10.7 x 5.2 x 5.1 cm = volume: 150 mL. Increased echogenicity of renal parenchyma is noted suggesting medical renal disease. 3.8 cm complex abnormality is seen involving the lower pole which may represent neoplasm. 7 mm simple cyst is seen in midpole. No hydronephrosis visualized. Left Kidney: Renal measurements: 10.7 x 5.7 x 4.7 cm = volume: 150 mL. Increased echogenicity of renal parenchyma is noted suggesting medical renal disease. No mass or hydronephrosis visualized. Bladder: Appears normal for degree of bladder distention. Other: None. IMPRESSION: Increased echogenicity of renal parenchyma is noted bilaterally suggesting medical renal disease. No hydronephrosis or renal obstruction is noted. 3.8 cm complex rounded abnormality is seen involving  the lower pole of the right kidney which may represent complex cyst, but possible neoplasm cannot be excluded. Further evaluation with CT or MRI is recommended. Electronically Signed   By: Marijo Conception M.D.   On: 11/06/2019 16:30    Review of Systems  Unable to perform ROS: Dementia  Musculoskeletal: Positive for arthralgias (Left forearm).   Blood pressure 132/88, pulse (!) 55, temperature 97.6 F (36.4 Robert), temperature source Axillary, resp. rate 17, height 5\' 7"  (1.702 m), weight 81.7 kg, SpO2 100 %. Physical Exam Constitutional:      General: He is not in acute distress.    Appearance: He is well-developed. He is not diaphoretic.  HENT:     Head: Normocephalic and atraumatic.  Eyes:     General: No scleral icterus.       Right eye: No discharge.        Left eye: No discharge.     Conjunctiva/sclera: Conjunctivae normal.  Cardiovascular:     Rate and Rhythm: Normal rate and regular rhythm.  Pulmonary:     Effort: Pulmonary effort is  normal. No respiratory distress.  Musculoskeletal:     Cervical back: Normal range of motion.     Comments: UEx shoulder, elbow, wrist, digits- no skin wounds, forearm with mild edema, erythema prox volar aspect, fluid filled olecranon bursa, no apparent TTP, warmth, or erythema, elbow pain with extreme flexion, no instability, no blocks to motion  Sens  Ax/R/M/U intact  Mot   Ax/ R/ PIN/ M/ AIN/ U intact  Rad 2+  Skin:    General: Skin is warm and dry.  Neurological:     Mental Status: He is alert.  Psychiatric:        Behavior: Behavior normal.     Assessment/Plan: Left arm pain -- He definitely has bursitis but not sure it's septic. He does have a mildly elevated WBC but this could be due to his concurrent UTI. Will check doppler to make sure this isn't DVT. Will also aspirate and send for culture. Posterior splint/sling for comfort. Multiple medical problems including coronary artery disease with stent, COPD, IDDM, hypertension, dementia, and GERD -- per primary service     Lisette Abu, PA-[facility] [phone] 11/07/2019, 11:46 AM

## 2019-11-07 NOTE — Progress Notes (Signed)
Upper extremity venous LT study completed.   See Cv Proc for preliminary results.   Darlin Coco

## 2019-11-07 NOTE — Consult Note (Addendum)
[name] MRN:  [id number] DOB:  Sep 06, 1928 LOS: 1 ADMISSION DATE:  11/06/2019  CONSULTATION DATE:  11/06/2019 REFERRING MD:  Dr. Wynetta Fines  REASON FOR CONSULTATION:  Shortness of breath   Initial Pulmonary/Critical Care Consultation  Brief History   N/A  History of present illness   This 86 y.o. obese, Asian [place] male reformed smoker is seen in consultation at the request of Dr. Wynetta Fines for recommendations on further evaluation and management of shortness of breath.  He presented to the [facility] Emergency Department via EMS with complaints of dyspnea and respiratory distress.  The patient is quite hard of hearing.  The patient's daughter is at bedside and able to provide additional clinical information.  He apparently presented with increased work of breathing and wheezing, which improved with albuterol  He has COPD and is supposed to use Trilogy at home.  He also has a CPAP for OSA but admits to nonadherence to prescribed therapy.  He apparently reported increasing edema and generalized weakness.  He has also been experiencing increased mental "fogginess" (he has baseline dementia) and has been noted to be spending many more hours sleeping through the day than normal.  At the time of clinical interview, the patient is awake, alert and cooperative.  Hard of hearing.  Oriented to person and place.  Denies pain or discomfort.  In the ER, he has been getting furosemide in the setting of increased edema and abnormal serum creatinine on labs.  He also has been given hydralazine and Imdur for blood pressure control.  Past Medical History:  Diagnosis Date  . Anemia   . Asthma   . Benign prostatic hypertrophy   . Bilateral sensorineural hearing loss   . CAD (coronary artery disease) 07/2003   a.  3.0 x 28 mm Taxus DES in the mid RCA in 2005  . COPD (chronic obstructive pulmonary disease) ([place])   . Diverticulosis   . DJD (degenerative joint disease)   . DM  (diabetes mellitus) ([place])   . Dyslipidemia   . GERD (gastroesophageal reflux disease)   . Hemorrhoids   . Hiatal hernia   . Hypertension   . OSA (obstructive sleep apnea)    cpap use  . Osteoarthritis   . Overactive bladder   . Rhinitis   . Seasonal allergies   . Shingles   . Stroke ([place])   . TIA (transient ischemic attack) 12/2006   Dr. Leonie Man     [facility] Events   N/A   Consults:  PCCM Cardiology   Procedures:  N/A   Significant Diagnostic Tests:  CT abdomen/pelvis (7/7) shows 4.8 cm 4.3 cm isodense soft tissue mass in the right kidney; large gastric hernia; colonic diverticulosis; fat-containing periumbilical hernia; prostate radiation implantation seeds in a mildly enlarged prostate gland; severe, multilevel, degenerative changes throughout the lumbar spine; very small right pleural effusion; aortic atherosclerosis.  Renal ultrasound (7/7) shows increased echogenicity of renal parenchyma bilaterally.  No hydronephrosis.  No renal obstruction.  Complex rounded abnormality (3.8 cm) involving the lower pole of the right kidney.  Chest x-ray (7/7) shows increased interstitial markings and small, bilateral pleural effusions.   Micro Data:   Results for orders placed or performed during the hospital encounter of 11/06/19  SARS Coronavirus 2 by RT PCR (hospital order, performed in [facility] hospital lab) Nasopharyngeal Nasopharyngeal Swab     Status: None   Collection Time: 11/06/19  2:34 PM   Specimen: Nasopharyngeal  Swab  Result Value Ref Range Status   SARS Coronavirus 2 NEGATIVE NEGATIVE Final    Comment: (NOTE) SARS-CoV-2 target nucleic acids are NOT DETECTED.  The SARS-CoV-2 RNA is generally detectable in upper and lower respiratory specimens during the acute phase of infection. The lowest concentration of SARS-CoV-2 viral copies this assay can detect is 250 copies / mL. A negative result does not preclude SARS-CoV-2 infection and should not be  used as the sole basis for treatment or other patient management decisions.  A negative result may occur with improper specimen collection / handling, submission of specimen other than nasopharyngeal swab, presence of viral mutation(s) within the areas targeted by this assay, and inadequate number of viral copies (<250 copies / mL). A negative result must be combined with clinical observations, patient history, and epidemiological information.  Fact Sheet for Patients:   StrictlyIdeas.no  Fact Sheet for Healthcare Providers: BankingDealers.co.za  This test is not yet approved or  cleared by the [place] FDA and has been authorized for detection and/or diagnosis of SARS-CoV-2 by FDA under an Emergency Use Authorization (EUA).  This EUA will remain in effect (meaning this test can be used) for the duration of the COVID-19 declaration under Section 564(b)(1) of the Act, 21 U.S.C. section 360bbb-3(b)(1), unless the authorization is terminated or revoked sooner.  Performed at [facility], [place] [address], [place], [place] [address]       Antimicrobials:  N/A   Interim history/subjective:  No acute distress at rest.   Objective   BP 132/88 (BP Location: Left Arm)   Pulse (!) 55   Temp 97.6 F (36.4 C) (Axillary)   Resp 17   Ht 5\' 7"  (1.702 m)   Wt 81.7 kg   SpO2 100%   BMI 28.21 kg/m     Filed Weights   11/06/19 1122 11/07/19 0636  Weight: 75 kg 81.7 kg    Intake/Output Summary (Last 24 hours) at 11/07/2019 0831 Last data filed at 11/07/2019 0703 Gross per 24 hour  Intake 50 ml  Output 1750 ml  Net -1700 ml        Examination: General: Elderly male extremely hard of hearing appears confused but difficult to ascertain due to his difficulty hearing HEENT: No JVD or lymphadenopathy is appreciated Neuro: Follows some simple commands. Cannot tell me where he is. Does not know why he is in the hospital. CV:  Heart sounds are distant  PULM: Decreased breath sounds in the bases. Faint expiratory wheezes appreciated Currently on room air with saturations greater 95% GI: soft, bsx4 active  GU: Voids Extremities: warm/dry, 1+ edema  Skin: no rashes or lesions  Impression Plan Chronic struct of pulmonary disease currently on room air without acute symptoms Obstructive sleep apnea Would use nebulized bronchodilators as I do not think he can handle MDIs at this time Inhaled steroids Currently on room air Pulmonary critical care will be available as needed   Acute on chronic diastolic congestive heart failure which is followed by cardiology Hypertension Currently negative I&O Cardiology is following  Hyperkalemia Recent Labs  Lab 11/06/19 1833 11/07/19 0241 11/07/19 0245  K 5.4* 5.1 5.1   Somewhat improved with diuresis Continue to monitor   Acute on chronic renal failure Lab Results  Component Value Date   CREATININE 2.70 (H) 11/07/2019   CREATININE 2.52 (H) 11/07/2019   CREATININE 2.32 (H) 11/06/2019  Monitor closely during diuresis    Dementia Per primary  Best practice:  Diet: diabetic Pain/Anxiety/Delirium  protocol (if indicated): N/A VAP protocol (if indicated): N/A DVT prophylaxis: heparin GI prophylaxis: Protonix Glucose control: sliding scale insulin Mobility/Activity: bed rest w/bathroom privileges CODE STATUS: DNR Family Communication:  patient's family (daughter) Disposition: at the discretion of the admitting physician   Labs   CBC: Recent Labs  Lab 11/06/19 1126 11/06/19 1833 11/07/19 0245  WBC 14.5*  --   --   HGB 9.0* 8.5* 9.2*  HCT 28.9* 25.0* 27.0*  MCV 79.8*  --   --   PLT 214  --   --     Basic Metabolic Panel: Recent Labs  Lab 11/06/19 1126 11/06/19 1420 11/06/19 1833 11/07/19 0241 11/07/19 0245  NA 137  --  139 139 140  K 6.2* 6.4* 5.4* 5.1 5.1  CL 106  --   --  105 103  CO2 19*  --   --  23  --   GLUCOSE 190*  --   --   123* 119*  BUN 40*  --   --  36* 37*  CREATININE 2.32*  --   --  2.52* 2.70*  CALCIUM 9.0  --   --  9.2  --    GFR: Estimated Creatinine Clearance: 19.3 mL/min (A) (by C-G formula based on SCr of 2.7 mg/dL (H)). Recent Labs  Lab 11/06/19 1126 11/06/19 1415  WBC 14.5*  --   LATICACIDVEN 3.5* 1.9    Liver Function Tests: No results for input(s): AST, ALT, ALKPHOS, BILITOT, PROT, ALBUMIN in the last 168 hours. No results for input(s): LIPASE, AMYLASE in the last 168 hours. No results for input(s): AMMONIA in the last 168 hours.  ABG    Component Value Date/Time   PHART 7.369 11/06/2019 1833   PCO2ART 38.4 11/06/2019 1833   PO2ART 78 (L) 11/06/2019 1833   HCO3 22.1 11/06/2019 1833   TCO2 25 11/07/2019 0245   ACIDBASEDEF 3.0 (H) 11/06/2019 1833   O2SAT 95.0 11/06/2019 1833     Coagulation Profile: No results for input(s): INR, PROTIME in the last 168 hours.  Cardiac Enzymes: No results for input(s): CKTOTAL, CKMB, CKMBINDEX, TROPONINI in the last 168 hours.  HbA1C: Hgb A1c MFr Bld  Date/Time Value Ref Range Status  11/07/2019 02:41 AM 9.4 (H) 4.8 - 5.6 % Final    Comment:    (NOTE) Pre diabetes:          5.7%-6.4%  Diabetes:              >6.4%  Glycemic control for   <7.0% adults with diabetes   02/23/2016 12:00 PM 8.3 (H) 4.8 - 5.6 % Final    Comment:    (NOTE)         Pre-diabetes: 5.7 - 6.4         Diabetes: >6.4         Glycemic control for adults with diabetes: <7.0     CBG: Recent Labs  Lab 11/06/19 1738 11/06/19 1821 11/06/19 2229 11/07/19 0050 11/07/19 0649  GLUCAP 61* 118* 115* 116* 108*

## 2019-11-07 NOTE — Progress Notes (Signed)
Orthopedic Tech Progress Note Patient Details:  DANYAL WHITENACK [birth year] [id number]  Ortho Devices Type of Ortho Device: Post (long arm) splint, Shoulder immobilizer Ortho Device/Splint Location: LUE Ortho Device/Splint Interventions: Ordered, Application, Adjustment   Post Interventions Instructions Provided: Care of device, Poper ambulation with device   Gwyndolyn Guilford 11/07/2019, 4:10 PM

## 2019-11-07 NOTE — ED Notes (Signed)
The pt is alert feels like he needs to urinate and forgets that he has a condom cath in place

## 2019-11-07 NOTE — Evaluation (Signed)
Physical Therapy Evaluation Patient Details Name: [name] MRN: [id number] DOB: 07-31-1928 Today's Date: 11/07/2019   History of Present Illness  Pt is an 86 yo male presenting with increased SOB, workup revealed acute on chronic CHF decompensation with hyperkalemia, UTI, and AKI. PMH includes: dementia, CAD s/p stent placement, COPD, DM, HTN, and GERD.  Clinical Impression  Pt in bed upon arrival of PT, agreeable to evaluation at this time. Prior to admission the pt was ambulating in the home without use of AD despite family attempts to convince him to use a device, but family reports ~2 falls/month. At home, the pt resists attempts to assist with mobility and ADLs, despite presence of aides 4 hours each morning and 3 hours each afternoon. The pt now presents with limitations in functional mobility, strength, stability, and coordination which, in addition to above dx and baseline cognitive deficits, significantly limit his ability to mobilize safely without assist. The pt will continue to benefit from skilled PT acutely and following d/c to improve functional strength and reduce need for assist with transfers, as well as to progress mobility and stability to reduce risk of falls.    Follow Up Recommendations Home health PT;Supervision/Assistance - 24 hour (Due to the pt's cognitive deficits, the family feel a familiar home environment will be best for him after d/c)    Equipment Recommendations  Rolling walker with 5" wheels    Recommendations for Other Services OT consult     Precautions / Restrictions Precautions Precautions: Fall Precaution Comments: pt family reports fall every other week, moderate cog deficits at baseline Restrictions Weight Bearing Restrictions: No      Mobility  Bed Mobility Overal bed mobility: Needs Assistance Bed Mobility: Supine to Sit     Supine to sit: Min assist;HOB elevated     General bed mobility comments: minA to elevate trunk from [facility], pt  reaching for HHA to complete. minA to scoot to EOB  Transfers Overall transfer level: Needs assistance Equipment used: 1 person hand held assist;Rolling walker (2 wheeled) Transfers: Sit to/from [facility] Sit to Stand: Mod assist Stand pivot transfers: Mod assist       General transfer comment: pt initially with modA through HHA to rise from bed, when provided RW he refused to receive assist to stand, and was able to stand with sig extra time from the recliner. minA to steady in standing  Ambulation/Gait Ambulation/Gait assistance: Mod assist Gait Distance (Feet): 5 Feet Assistive device: Rolling walker (2 wheeled) Gait Pattern/deviations: Shuffle;Wide base of support;Staggering right;Staggering left;Leaning posteriorly Gait velocity: decreased   General Gait Details: The pt is not familiar with a walker, but it was trialed to increase stability. The pt takes short shuffling steps with minimal clearance and poor positining in the RW despite cues. The pt had multiple LOB posteriorly during ambulation and was not able to change direction without mod/maxA      Balance Overall balance assessment: Needs assistance Sitting-balance support: No upper extremity supported;Feet supported Sitting balance-Leahy Scale: Fair Sitting balance - Comments: pt able to sit EOB without assist statically, minG to reach for feet Postural control: Posterior lean Standing balance support: Bilateral upper extremity supported;During functional activity Standing balance-Leahy Scale: Poor Standing balance comment: multiple LOB posteriorly when walking, poor use of RW, decreased awareness of safety                             Pertinent Vitals/Pain  Home Living Family/patient expects to be discharged to:: Private residence Living Arrangements: Spouse/significant other Available Help at Discharge: Family;Available 24 hours/day Type of Home: House Home Access: Ramped  entrance (stairs on front porch,pt uses ramp when leaving)     Home Layout: Two level;Able to live on main level with bedroom/bathroom Home Equipment: Grab bars - tub/shower;Grab bars - toilet;Walker - 4 wheels;Cane - quad;Shower seat      Prior Function Level of Independence: Needs assistance   Gait / Transfers Assistance Needed: pt ambulates independenty, reports multiple falls reccently refusing to use AD  ADL's / Homemaking Assistance Needed: pt completed bathing and dressing independently despite aide attempts to help. family reports most falls occur in bathroom  Comments: aides come 4 hours each morning and 3 hours each afternoon     Hand Dominance   Dominant Hand: Right    Extremity/Trunk Assessment   Upper Extremity Assessment Upper Extremity Assessment: Generalized weakness;LUE deficits/detail LUE Deficits / Details: sig swelling, warm to the touch. pt with noteable decreased ROM and strength, able to move against gravity LUE Coordination: decreased fine motor    Lower Extremity Assessment Lower Extremity Assessment: Generalized weakness (globally reduced strength and coordination, may be related more to cog than strength, but difficult to differentiate.)    Cervical / Trunk Assessment Cervical / Trunk Assessment: Kyphotic  Communication   Communication: No difficulties  Cognition Arousal/Alertness: Awake/alert Behavior During Therapy: Flat affect;WFL for tasks assessed/performed Overall Cognitive Status: History of cognitive impairments - at baseline Area of Impairment: Memory;Following commands;Safety/judgement;Problem solving                     Memory: Decreased short-term memory Following Commands: Follows one step commands inconsistently Safety/Judgement: Decreased awareness of deficits;Decreased awareness of safety   Problem Solving: Decreased initiation;Slow processing;Difficulty sequencing;Requires verbal cues General Comments: Pt with moderate  cog deficits at baseline, daughter was present to provide history as pt giving incorrect information. Daughter reports he does not generally accept assist at home      General Comments General comments (skin integrity, edema, etc.): pt with sig swollen LUE, family reports it has been like that PTA        Assessment/Plan    PT Assessment Patient needs continued PT services  PT Problem List Decreased strength;Decreased mobility;Decreased safety awareness;Decreased coordination;Decreased knowledge of precautions;Decreased activity tolerance;Decreased balance;Decreased knowledge of use of DME;Decreased cognition       PT Treatment Interventions DME instruction;Therapeutic exercise;Gait training;Stair training;Functional mobility training;Therapeutic activities;Patient/family education;Balance training    PT Goals (Current goals can be found in the Care Plan section)  Acute Rehab PT Goals Patient Stated Goal: maintain independence PT Goal Formulation: With patient/family Time For Goal Achievement: 11/21/19 Potential to Achieve Goals: Fair    Frequency Min 3X/week   Barriers to discharge           AM-PAC PT "6 Clicks" Mobility  Outcome Measure Help needed turning from your back to your side while in a flat bed without using bedrails?: A Little Help needed moving from lying on your back to sitting on the side of a flat bed without using bedrails?: A Little Help needed moving to and from a bed to a chair (including a wheelchair)?: A Lot Help needed standing up from a chair using your arms (e.g., wheelchair or bedside chair)?: A Little Help needed to walk in hospital room?: A Lot Help needed climbing 3-5 steps with a railing? : Total 6 Click Score: 14    End of  Session Equipment Utilized During Treatment: Gait belt Activity Tolerance: Patient tolerated treatment well Patient left: in chair;with family/visitor present;with call bell/phone within reach Nurse Communication: Mobility  status PT Visit Diagnosis: Repeated falls (R29.6);History of falling (Z91.81);Difficulty in walking, not elsewhere classified (R26.2)    Time: 1025-8527 (also 1020-1030) PT Time Calculation (min) (ACUTE ONLY): 34 min   Charges:   PT Evaluation $PT Eval Moderate Complexity: 1 Mod PT Treatments $Gait Training: 8-22 mins $Therapeutic Activity: 8-22 mins        Karma Ganja, PT, DPT   Acute Rehabilitation Department Pager #: [phone]  Otho Bellows 11/07/2019, 2:34 PM

## 2019-11-07 NOTE — Progress Notes (Signed)
Pharmacy Antibiotic Note  Trace S Balon is a 86 y.o. male with possible septic bursitis  SCr 2.7  GPC in fluid cx  Height: 5\' 7"  (170.2 cm) Weight: 81.7 kg (180 lb 1.9 oz) IBW/kg (Calculated) : 66.1  Temp (24hrs), Avg:97.6 F (36.4 C), Min:97.3 F (36.3 C), Max:97.9 F (36.6 C)  Recent Labs  Lab 11/06/19 1126 11/06/19 1415 11/07/19 0241 11/07/19 0245  WBC 14.5*  --  11.5*  --   CREATININE 2.32*  --  2.52* 2.70*  LATICACIDVEN 3.5* 1.9  --   --     Estimated Creatinine Clearance: 19.3 mL/min (A) (by C-G formula based on SCr of 2.7 mg/dL (H)).    Allergies  Allergen Reactions   Ciprofloxacin Hcl Other (See Comments)    Unknown reaction   Mirabegron Other (See Comments)    FATIGUE   Penicillins     Has patient had a PCN reaction causing immediate rash, facial/tongue/throat swelling, SOB or lightheadedness with hypotension: Unknown Has patient had a PCN reaction causing severe rash involving mucus membranes or skin necrosis: Unknown Has patient had a PCN reaction that required hospitalization Unknown Has patient had a PCN reaction occurring within the last 10 years: Unknown If all of the above answers are "NO", then may proceed with Cephalosporin use.    Sulfa Antibiotics Hives and Rash   Sulfamethoxazole Rash   Sulfonamide Derivatives Rash   Plan: Vanc 1000 q48 h est auc  444 Monitor renal fx cx vanc lvls prn Continue ctx doxy per pulm - potential ID consult in am  Barth Kirks, PharmD, BCPS, BCCCP Clinical Pharmacist [phone]  Please check AMION for all [place] numbers  11/07/2019 7:19 PM

## 2019-11-07 NOTE — Progress Notes (Signed)
Long 22g placed in R forearm with ultrasound guidance. Pt appears to have dependent edema at dorsal aspect of R arm. Vein used goes straight up anterior/lateral arm on ultrasound.

## 2019-11-08 LAB — URINALYSIS, COMPLETE (UACMP) WITH MICROSCOPIC
Bilirubin Urine: NEGATIVE
Glucose, UA: NEGATIVE mg/dL
Hgb urine dipstick: NEGATIVE
Ketones, ur: NEGATIVE mg/dL
Nitrite: NEGATIVE
Protein, ur: NEGATIVE mg/dL
Specific Gravity, Urine: 1.014 (ref 1.005–1.030)
pH: 5 (ref 5.0–8.0)

## 2019-11-08 LAB — CBC
HCT: 26.5 % — ABNORMAL LOW (ref 39.0–52.0)
Hemoglobin: 8.2 g/dL — ABNORMAL LOW (ref 13.0–17.0)
MCH: 24 pg — ABNORMAL LOW (ref 26.0–34.0)
MCHC: 30.9 g/dL (ref 30.0–36.0)
MCV: 77.5 fL — ABNORMAL LOW (ref 80.0–100.0)
Platelets: 194 10*3/uL (ref 150–400)
RBC: 3.42 MIL/uL — ABNORMAL LOW (ref 4.22–5.81)
RDW: 17.3 % — ABNORMAL HIGH (ref 11.5–15.5)
WBC: 12.7 10*3/uL — ABNORMAL HIGH (ref 4.0–10.5)
nRBC: 0.2 % (ref 0.0–0.2)

## 2019-11-08 LAB — GLUCOSE, CAPILLARY
Glucose-Capillary: 178 mg/dL — ABNORMAL HIGH (ref 70–99)
Glucose-Capillary: 185 mg/dL — ABNORMAL HIGH (ref 70–99)
Glucose-Capillary: 231 mg/dL — ABNORMAL HIGH (ref 70–99)
Glucose-Capillary: 181 mg/dL — ABNORMAL HIGH (ref 70–99)

## 2019-11-08 LAB — BRAIN NATRIURETIC PEPTIDE: B Natriuretic Peptide: 189.6 pg/mL — ABNORMAL HIGH (ref 0.0–100.0)

## 2019-11-08 LAB — BASIC METABOLIC PANEL
Anion gap: 12 (ref 5–15)
BUN: 45 mg/dL — ABNORMAL HIGH (ref 8–23)
CO2: 25 mmol/L (ref 22–32)
Calcium: 8.6 mg/dL — ABNORMAL LOW (ref 8.9–10.3)
Chloride: 101 mmol/L (ref 98–111)
Creatinine, Ser: 3.37 mg/dL — ABNORMAL HIGH (ref 0.61–1.24)
GFR calc Af Amer: 18 mL/min — ABNORMAL LOW (ref >60)
GFR calc non Af Amer: 15 mL/min — ABNORMAL LOW (ref >60)
Glucose, Bld: 205 mg/dL — ABNORMAL HIGH (ref 70–99)
Potassium: 4.4 mmol/L (ref 3.5–5.1)
Sodium: 138 mmol/L (ref 135–145)

## 2019-11-08 LAB — PROTEIN / CREATININE RATIO, URINE
Creatinine, Urine: 109.59 mg/dL
Protein Creatinine Ratio: 0.2 mg/mg{Cre} — ABNORMAL HIGH (ref 0.00–0.15)
Total Protein, Urine: 22 mg/dL

## 2019-11-08 LAB — CREATININE, URINE, RANDOM: Creatinine, Urine: 105.62 mg/dL

## 2019-11-08 LAB — PATHOLOGIST SMEAR REVIEW

## 2019-11-08 LAB — SODIUM, URINE, RANDOM: Sodium, Ur: 71 mmol/L

## 2019-11-08 MED ORDER — PREDNISONE 5 MG PO TABS: 7.5000 mg | ORAL_TABLET | Freq: Every day | ORAL | Status: DC

## 2019-11-08 MED ORDER — DOXYCYCLINE HYCLATE 100 MG PO TABS
100.0000 mg | ORAL_TABLET | Freq: Two times a day (BID) | ORAL | Status: DC
  Administered 2019-11-08: 100 mg via ORAL
  Filled 2019-11-08 (×2): qty 1

## 2019-11-08 MED ORDER — SODIUM CHLORIDE 0.9 % IV SOLN
2.0000 g | Freq: Every day | INTRAVENOUS | Status: DC
  Administered 2019-11-08: 2 g via INTRAVENOUS
  Filled 2019-11-08 (×2): qty 20

## 2019-11-08 MED ORDER — ALBUTEROL SULFATE (2.5 MG/3ML) 0.083% IN NEBU: 3.0000 mL | INHALATION_SOLUTION | Freq: Two times a day (BID) | RESPIRATORY_TRACT | Status: DC

## 2019-11-08 MED ORDER — IPRATROPIUM-ALBUTEROL 0.5-2.5 (3) MG/3ML IN SOLN
3.0000 mL | Freq: Two times a day (BID) | RESPIRATORY_TRACT | Status: DC
  Administered 2019-11-08 – 2019-11-13 (×10): 3 mL via RESPIRATORY_TRACT
  Filled 2019-11-08 (×10): qty 3

## 2019-11-08 MED ORDER — FUROSEMIDE 40 MG PO TABS: 40.0000 mg | ORAL_TABLET | Freq: Two times a day (BID) | ORAL | Status: DC

## 2019-11-08 MED ORDER — ALBUTEROL SULFATE (2.5 MG/3ML) 0.083% IN NEBU
3.0000 mL | INHALATION_SOLUTION | Freq: Four times a day (QID) | RESPIRATORY_TRACT | Status: DC | PRN
  Administered 2019-11-13: 3 mL via RESPIRATORY_TRACT
  Filled 2019-11-08: qty 3

## 2019-11-08 MED ORDER — SODIUM CHLORIDE 0.9 % IV SOLN: INTRAVENOUS | Status: AC

## 2019-11-08 NOTE — Progress Notes (Signed)
Spoke w/family member and office of pt experience; due to pt dementia, exception rules to visitation will apply. Family allowed at bedside 24/7 and may need more than the 2 allowed for relief/resting of family members. Note placed in designated visitor spot.

## 2019-11-08 NOTE — Progress Notes (Signed)
Bursa with staph aureus, sensitivities pending.    Updated his son, and have discussed the case with Dr. Edmonia Lynch, who will be on-call on Sunday.  We will plan to reevaluate Sunday morning, and determine how he is doing clinically, both physiologically as well as with respect to his elbow, and consider surgical debridement under regional or local anesthetic late Sunday morning, depending on how he looks by then.  I will keep him n.p.o. after midnight Saturday night, and Dr. Edmonia Lynch will evaluate in more detail on Sunday morning.  Johnny Bridge, MD

## 2019-11-08 NOTE — Evaluation (Signed)
Occupational Therapy Evaluation Patient Details Name: [name] MRN: [id number] DOB: 03-07-1929 Today's Date: 11/08/2019    History of Present Illness Pt is an 86 yo male presenting with increased SOB, workup revealed acute on chronic CHF decompensation with hyperkalemia, UTI, and AKI. PMH includes: dementia, CAD s/p stent placement, COPD, DM, HTN, and GERD.   Clinical Impression   PTA patient was living with family and was requiring assistance with LB dressing tasks. Male family member present at bedside reports patient was able to dress himself and was ambulating to commode in bathroom without AD although family encouraged him to use RW. Patient sustained multiple falls from decrease balance and decreased safety awareness prior to admission. Patient currently presents below baseline level of function requiring Min A grossly for UB BADLs, Mod-Max A grossly for LB BADLs, and Mod-Max A for functional mobility without use of DME. Patient also limited by hard splint to L elbow limiting use of LUE during functional tasks, decreased cognition at baseline, and generalized weakness. Patient would benefit from acute OT services in prep for safe d/c to next level of care with family requesting HH therapies.     Follow Up Recommendations  Home health OT;Supervision/Assistance - 24 hour    Equipment Recommendations  3 in 1 bedside commode    Recommendations for Other Services       Precautions / Restrictions Precautions Precautions: Fall Precaution Comments: pt family reports fall every other week, moderate cog deficits at baseline Required Braces or Orthoses: Sling;Splint/Cast Splint/Cast: L arm posterior long arm splint, shoulder immobilizer Restrictions Weight Bearing Restrictions: No      Mobility Bed Mobility Overal bed mobility: Needs Assistance Bed Mobility: Supine to Sit     Supine to sit: Mod assist     General bed mobility comments: With HOB flat and assist to bring trunk  upright.   Transfers Overall transfer level: Needs assistance Equipment used: 1 person hand held assist Transfers: Sit to/from [facility] Sit to Stand: Mod assist         General transfer comment: Mod A for STS from elevated EOB and for SPT to recliner on L with hand held assist.     Balance Overall balance assessment: Needs assistance Sitting-balance support: No upper extremity supported;Feet supported Sitting balance-Leahy Scale: Fair     Standing balance support: Single extremity supported Standing balance-Leahy Scale: Poor Standing balance comment: Heavy reliance on external support with hand held assist                           ADL either performed or assessed with clinical judgement   ADL Overall ADL's : Needs assistance/impaired Eating/Feeding: Set up;Sitting Eating/Feeding Details (indicate cue type and reason): Patient to take bits of sandwich and bring cup to mouth to drink with RUE. Limited use of LUE 2/2 splint limiting movement.  Grooming: Minimal assistance;Sitting   Upper Body Bathing: Moderate assistance;Sitting   Lower Body Bathing: Maximal assistance;Sit to/from stand   Upper Body Dressing : Moderate assistance;Sitting   Lower Body Dressing: Maximal assistance;Sit to/from stand   Toilet Transfer: Moderate assistance;Stand-pivot           Functional mobility during ADLs: Moderate assistance;Maximal assistance General ADL Comments: Max A for SPT to recliner on L with hand held assist without use of DME.      Vision Baseline Vision/History: Wears glasses Patient Visual Report: No change from baseline Vision Assessment?: No apparent visual deficits  Perception     Praxis      Pertinent Vitals/Pain Pain Assessment: No/denies pain     Hand Dominance Right   Extremity/Trunk Assessment Upper Extremity Assessment Upper Extremity Assessment: LUE deficits/detail LUE Deficits / Details: Decreased ROM at elbow  however limited assessment 2/2 splint. Shoulder, wrist, and digits WFL.  LUE: Unable to fully assess due to immobilization LUE Sensation: WNL LUE Coordination: decreased fine motor   Lower Extremity Assessment Lower Extremity Assessment: Defer to PT evaluation       Communication Communication Communication: No difficulties   Cognition Arousal/Alertness: Awake/alert Behavior During Therapy: WFL for tasks assessed/performed Overall Cognitive Status: Impaired/Different from baseline Area of Impairment: Following commands;Memory;Safety/judgement                     Memory: Decreased short-term memory Following Commands: Follows one step commands consistently;Follows one step commands with increased time Safety/Judgement: Decreased awareness of deficits   Problem Solving: Slow processing;Requires verbal cues General Comments: Pt with moderate cog deficits at baseline, daughter was present to provide history as pt giving incorrect information. Daughter reports he does not generally accept assist at home   General Comments       Exercises     Shoulder [place] expects to be discharged to:: Private residence Living Arrangements: Spouse/significant other Available Help at Discharge: Family;Available 24 hours/day Type of Home: House Home Access: Ramped entrance (stairs on front porch,pt uses ramp when leaving)     Home Layout: Two level;Able to live on main level with bedroom/bathroom     Bathroom Shower/Tub: Walk-in shower;Curtain   Bathroom Toilet: Handicapped height     Home Equipment: Grab bars - tub/shower;Grab bars - toilet;Walker - 4 wheels;Cane - quad;Shower seat          Prior Functioning/Environment Level of Independence: Needs assistance  Gait / Transfers Assistance Needed: pt ambulates independenty, reports multiple falls reccently refusing to use AD ADL's / Homemaking Assistance Needed: pt completed bathing and  dressing independently despite aide attempts to help. family reports most falls occur in bathroom   Comments: aides come 4 hours each morning and 3 hours each afternoon        OT Problem List: Decreased strength;Decreased range of motion;Decreased activity tolerance;Impaired balance (sitting and/or standing);Decreased coordination;Decreased cognition;Decreased safety awareness;Decreased knowledge of use of DME or AE;Decreased knowledge of precautions;Impaired UE functional use      OT Treatment/Interventions: Self-care/ADL training;Therapeutic exercise;Energy conservation;DME and/or AE instruction;Therapeutic activities;Cognitive remediation/compensation;Patient/family education;Balance training    OT Goals(Current goals can be found in the care plan section) Acute Rehab OT Goals Patient Stated Goal: No goals stated.  OT Goal Formulation: With patient/family Time For Goal Achievement: 11/22/19 Potential to Achieve Goals: Good ADL Goals Pt Will Perform Upper Body Bathing: (P) with supervision;sitting Pt Will Perform Lower Body Bathing: (P) with min assist;sit to/from stand Pt Will Perform Upper Body Dressing: (P) with set-up;sitting Pt Will Perform Lower Body Dressing: (P) with min assist;sit to/from stand Pt Will Transfer to Toilet: (P) with supervision;ambulating;bedside commode Pt Will Perform Toileting - Clothing Manipulation and hygiene: (P) with supervision;sit to/from stand Pt Will Perform Tub/Shower Transfer: (P) with min guard assist;ambulating;shower seat;rolling walker  OT Frequency: Min 2X/week   Barriers to D/C:            Co-evaluation              AM-PAC OT "6 Clicks" Daily Activity     Outcome Measure Help from  another person eating meals?: A Little Help from another person taking care of personal grooming?: A Little Help from another person toileting, which includes using toliet, bedpan, or urinal?: A Lot Help from another person bathing (including washing,  rinsing, drying)?: A Lot Help from another person to put on and taking off regular upper body clothing?: A Lot Help from another person to put on and taking off regular lower body clothing?: A Lot 6 Click Score: 14   End of Session Equipment Utilized During Treatment: Gait belt  Activity Tolerance: Patient tolerated treatment well Patient left: in chair;with call bell/phone within reach;with chair alarm set;with family/visitor present  OT Visit Diagnosis: Unsteadiness on feet (R26.81);Muscle weakness (generalized) (M62.81);Repeated falls (R29.6)                Time: 1350-1410 OT Time Calculation (min): 20 min Charges:  OT General Charges $OT Visit: 1 Visit OT Evaluation $OT Eval Moderate Complexity: 1 Mod  Lizzette Carbonell H. OTR/L Supplemental OT, Department of rehab services [phone]  Taige Housman R H. 11/08/2019, 4:42 PM

## 2019-11-08 NOTE — Progress Notes (Signed)
Patient seen and examined.  Splint removed.  No major events overnight.  His olecranon bursa is somewhat inflamed, and boggy, with some surrounding cellulitis and swelling throughout the upper extremity.  No drainage that I can appreciate.  Impression septic olecranon bursitis, left elbow.  He has multiple other medical comorbidities active.  These include congestive heart failure, respiratory compromise, renal dysfunction, as well as dementia.  I have discussed the options with his daughter, as well as his son, Dr. Berneta Sages.  Given all of the complicating medical and pulmonary comorbidities, we have decided to pursue an initial conservative route for the separate olecranon bursitis, await culture results, tailor antibiotics accordingly.  Continue splinting.  He may need surgical intervention down the line if he fails to improve clinically, but hopefully his medical issues can be optimized prior to considering general anesthesia.  There is considerable risk with his cognitive function given general anesthesia as well, and the family has expressed interest in avoiding this if at all possible.  He does not appear currently septic, and we do have a chance of treating his olecranon bursitis medically successfully, we will monitor closely over the next couple of days his medical situation, as well as the appearance of his olecranon bursa, and reserve surgical intervention for last resort.  Dr. Lennette Bihari Haddix will be on-call tomorrow, and Dr. Edmonia Lynch will also be on call on Sunday, if any urgent surgical intervention is indicated.  Otherwise I will plan to reassess on Monday.  We will continue to follow.  Marchia Bond, MD

## 2019-11-08 NOTE — Progress Notes (Addendum)
PROGRESS NOTE    [name]  IWP:[id number] DOB: [birth year] DOA: 11/06/2019 PCP: Leanna Battles, MD      Brief Narrative:  [name] is a 86 y.o. M with hx mod dementia, home dwelling, CAD s/p PCI 2005, HTN, COPD not on O2, DM, CKD IIIa baseline 1.2 per our records, OSA on CPAP and possible TIA who presented with few days of progressive SOB, leg/arm/face swelling.  In the ER, CXR showed edema and small effusion, K 6.2, Cr 2.3, Bicarb 19.  ECG with sinus bradycardia.  Also, lactate >3 and WBC 14K with UA suggesting UTI.  Cardiology and Pulmonology were consulted.  Patient started on IV Lasix for CHF, IV Rocephin for UTI.         Assessment & Plan:     Acute renal failure on CKD stage IIIa Baseline creatinine 1.2 per family.  On admission creatinine 2.3.    UA showed pyuria, no RBCs.  Renal imaging showed 4 cm kidney mass, but no hydronephrosis.  Suspected to be congestive nephropathy so diuresis started but renal function progressively worsening despite decreasing Lasix doses.  No NSAIDs or hypotension or contrast, but +vancomycin yesterday.  -Hold Lasix -Gentle IVF -Consult Nephrology, appreciate cares -Hold ARB (not on this PTA per family)    Hyperkalemia Acute metabolic acidosis Potassium and acidosis are resolved with diuresis.   Acute diastolic CHF Presented with SOB leg swelling, CXR with interstitial edema and BNP 477.  Admitted and started on IV Lasix  Echo showed normal EF, grade 2 diastolic dysfunction, normal valves.  Net output 700 negative yesterday (2L urine output), but Cr/BUN rising.  -Hold diuretics -Hold home potassium supplement  -Strict I/Os, daily weights, telemetry  -Daily monitoring renal function   Cellulitis of the left arm with olecranon bursitis This swelling developed in the left arm several days prior to admission.  Olecranon bursa aspirated yesterday, GPC's on Gram stain.  Patient does not have sepsis from this  infection -Continue ceftriaxone -Hold vancomycin -I would favor doxycycline, perhaps linezolid, will discuss with pharmacy and nephrology   Microcytic anemia Ferritin low normal, TIBC, iron sat and sat ratio all low.  No clear history GI blood loss.  RI low/hypoproliferative. -Continue iron -Cardiology have held Plavix  Bradycardia Mild sinus bradycardia and, asymptomatic in the setting of AKI, beta-blocker use, donepezil, hyperkalemia. -Hold nebivolol   UTI Presented with leukocytosis, pyuria. -Continue Rocephin, day 3 -Urine culture pending  Renal mass 4.8 x 4.3 cm soft tissue mass noted incidentally in the RIGHT kidney.  Will need contrasted study eventually.  Family are discussing goals of care, they are mulling whether further work up of this is patient's wishes -Obtain MRI abdomen with and without gadolinium when GFR improves  COPD without exacerbation OSA -Continue loratadine, montelukast -Continue Duonebs, scheduled -Continue Pulmicort -Hold home Trelegy -Continue CPAP at night -Continue prednisone at home dose 7.5 daily  Vascular disease, secondary prevention Hypertension Blood pressure controlled -Hold telmisartan -Continue low dose aspirin -Continue amlodipine -Hold clopidogrel  Diabetes A1c 9.4%. Gulcoses here well controlled to low -Hold sitagliptin-metformin -Continue sensitive SS correction insulin  Dementia -Continue donepezil, memantine, escitalopram  BPH LUTS -Continue mirabegron -Continue finasteride  GERD -Continue PPI   Acute metabolic encephalopathy due to renal failure superimposed on moderate baseline dementia No definite improvement            Disposition: Status is: Inpatient  Remains inpatient appropriate because:he has persistent encephalopathy, requires ongoing IV antibiotics and IV lasix   Dispo: The patient  is from: Home              Anticipated d/c is to: TBD              Anticipated d/c date is: 3 days               Patient currently is not medically stable to d/c.              MDM: The below labs and imaging reports reviewed and summarized above.  Medication management as above.     DVT prophylaxis: heparin injection 5,000 Units Start: 11/06/19 1530  Code Status: DNR Family Communication: Daughter and son at bedside    Consultants:   Cardiology  Pulmonology  Orthopedic  Procedures:   7/7 renal ultrasound --no hydronephrosis  7/7 CT abdomen and pelvis --renal mass noted, no other abnormalities  Antimicrobials:   Ceftriaxone 7/7 >>  Vancomycin 1 g x 1 7/8  Doxycycline x1 7/8   Culture data:   7/8 urine culture pending  7/8 blood culture x2 pending  7/8 olecranon bursa aspiration pending          Subjective: Patient weak, tired, no improvement in left arm swelling, no fever.  No respiratory distress.  Still generalized weakness, severe.      Objective: Vitals:   11/08/19 0404 11/08/19 0420 11/08/19 0848 11/08/19 0854  BP:  (!) 129/49  (!) 168/64  Pulse:  66  64  Resp:  15  15  Temp:  (!) 97.5 F (36.4 C)  (!) 97.2 F (36.2 C)  TempSrc:  Oral  Axillary  SpO2: 100% 100% 99% 100%  Weight:  82.5 kg    Height:        Intake/Output Summary (Last 24 hours) at 11/08/2019 0920 Last data filed at 11/08/2019 0421 Gross per 24 hour  Intake 1240 ml  Output 1225 ml  Net 15 ml   Filed Weights   11/06/19 1122 11/07/19 0636 11/08/19 0420  Weight: 75 kg 81.7 kg 82.5 kg    Examination: General appearance: Elderly adult male, lying in bed, sleeping, arousable sluggishly.     HEENT: Anicteric, conjunctival pink, lids and lashes normal.  No nasal deformity, discharge, or epistaxis, lips dry and cracked. Skin: The left arm is now wrapped, there is no spreading redness up the arm above the wrapping. Cardiac: RRR, no murmurs appreciated, JVP not visible, minimal pretibial edema, radial pulses symmetric and normal. Respiratory: Normal respiratory  rate and rhythm, lungs clear without rales or wheezes Abdomen: Abdomen soft without tenderness palpation or guarding, no ascites or distention. MSK:  Neuro: Severe generalized weakness, speech slow but fluent. Psych: Affect blunted, attention diminished, judgment insight appear moderately impaired      Data Reviewed: I have personally reviewed following labs and imaging studies:  CBC: Recent Labs  Lab 11/06/19 1126 11/06/19 1833 11/07/19 0241 11/07/19 0245 11/08/19 0306  WBC 14.5*  --  11.5*  --  12.7*  HGB 9.0* 8.5* 8.7* 9.2* 8.2*  HCT 28.9* 25.0* 28.3* 27.0* 26.5*  MCV 79.8*  --  78.6*  --  77.5*  PLT 214  --  214  --  644   Basic Metabolic Panel: Recent Labs  Lab 11/06/19 1126 11/06/19 1126 11/06/19 1420 11/06/19 1833 11/07/19 0241 11/07/19 0245 11/08/19 0306  NA 137  --   --  139 139 140 138  K 6.2*   < > 6.4* 5.4* 5.1 5.1 4.4  CL 106  --   --   --  105 103 101  CO2 19*  --   --   --  23  --  25  GLUCOSE 190*  --   --   --  123* 119* 205*  BUN 40*  --   --   --  36* 37* 45*  CREATININE 2.32*  --   --   --  2.52* 2.70* 3.37*  CALCIUM 9.0  --   --   --  9.2  --  8.6*   < > = values in this interval not displayed.   GFR: Estimated Creatinine Clearance: 15.6 mL/min (A) (by C-G formula based on SCr of 3.37 mg/dL (H)). Liver Function Tests: No results for input(s): AST, ALT, ALKPHOS, BILITOT, PROT, ALBUMIN in the last 168 hours. No results for input(s): LIPASE, AMYLASE in the last 168 hours. No results for input(s): AMMONIA in the last 168 hours. Coagulation Profile: No results for input(s): INR, PROTIME in the last 168 hours. Cardiac Enzymes: No results for input(s): CKTOTAL, CKMB, CKMBINDEX, TROPONINI in the last 168 hours. BNP (last 3 results) No results for input(s): PROBNP in the last 8760 hours. HbA1C: Recent Labs    11/07/19 0241  HGBA1C 9.4*   CBG: Recent Labs  Lab 11/07/19 0649 11/07/19 1204 11/07/19 1632 11/07/19 2121 11/08/19 0604   GLUCAP 108* 140* 177* 179* 178*   Lipid Profile: No results for input(s): CHOL, HDL, LDLCALC, TRIG, CHOLHDL, LDLDIRECT in the last 72 hours. Thyroid Function Tests: Recent Labs    11/07/19 0241  TSH 3.370   Anemia Panel: Recent Labs    11/07/19 0241  FERRITIN 60  TIBC 295  IRON 38*  RETICCTPCT 1.7   Urine analysis:    Component Value Date/Time   COLORURINE YELLOW 11/06/2019 1933   APPEARANCEUR HAZY (A) 11/06/2019 1933   LABSPEC 1.021 11/06/2019 1933   PHURINE 5.0 11/06/2019 1933   GLUCOSEU 50 (A) 11/06/2019 1933   HGBUR NEGATIVE 11/06/2019 1933   BILIRUBINUR NEGATIVE 11/06/2019 1933   KETONESUR NEGATIVE 11/06/2019 1933   PROTEINUR 100 (A) 11/06/2019 1933   UROBILINOGEN 0.2 09/24/2014 1442   NITRITE NEGATIVE 11/06/2019 1933   LEUKOCYTESUR LARGE (A) 11/06/2019 1933   Sepsis Labs: @LABRCNTIP (procalcitonin:4,lacticacidven:4)  ) Recent Results (from the past 240 hour(s))  SARS Coronavirus 2 by RT PCR (hospital order, performed in [facility] lab) Nasopharyngeal Nasopharyngeal Swab     Status: None   Collection Time: 11/06/19  2:34 PM   Specimen: Nasopharyngeal Swab  Result Value Ref Range Status   SARS Coronavirus 2 NEGATIVE NEGATIVE Final    Comment: (NOTE) SARS-CoV-2 target nucleic acids are NOT DETECTED.  The SARS-CoV-2 RNA is generally detectable in upper and lower respiratory specimens during the acute phase of infection. The lowest concentration of SARS-CoV-2 viral copies this assay can detect is 250 copies / mL. A negative result does not preclude SARS-CoV-2 infection and should not be used as the sole basis for treatment or other patient management decisions.  A negative result may occur with improper specimen collection / handling, submission of specimen other than nasopharyngeal swab, presence of viral mutation(s) within the areas targeted by this assay, and inadequate number of viral copies (<250 copies / mL). A negative result must be  combined with clinical observations, patient history, and epidemiological information.  Fact Sheet for Patients:   StrictlyIdeas.no  Fact Sheet for Healthcare Providers: BankingDealers.co.za  This test is not yet approved or  cleared by the [place] FDA and has been authorized for detection and/or diagnosis of  SARS-CoV-2 by FDA under an Emergency Use Authorization (EUA).  This EUA will remain in effect (meaning this test can be used) for the duration of the COVID-19 declaration under Section 564(b)(1) of the Act, 21 U.S.C. section 360bbb-3(b)(1), unless the authorization is terminated or revoked sooner.  Performed at [facility], [place] [address], [place], [place] [address]   Body fluid culture     Status: None (Preliminary result)   Collection Time: 11/07/19  1:48 PM   Specimen: Fluid  Result Value Ref Range Status   Specimen Description FLUID SYNOVIAL CYSTS  Final   Special Requests NONE  Final   Gram Stain   Final    ABUNDANT WBC PRESENT,BOTH PMN AND MONONUCLEAR RARE GRAM POSITIVE COCCI CRITICAL RESULT CALLED TO, READ BACK BY AND VERIFIED WITH: L HUYNH RN 11/07/19 1724 JDW Performed at [facility], [place] [address], [place], [place] [address]    Culture PENDING  Incomplete   Report Status PENDING  Incomplete         Radiology Studies: CT ABDOMEN PELVIS WO CONTRAST  Result Date: 11/06/2019 CLINICAL DATA:  Bilateral leg swelling. EXAM: CT ABDOMEN AND PELVIS WITHOUT CONTRAST TECHNIQUE: Multidetector CT imaging of the abdomen and pelvis was performed following the standard protocol without IV contrast. COMPARISON:  Sep 10, 2009 FINDINGS: Lower chest: Mild to moderate severity atelectasis is seen within the posterior aspect of the right lung base. There is a very small right pleural effusion. Hepatobiliary: No focal liver abnormality is seen. No gallstones, gallbladder wall thickening, or biliary dilatation. Pancreas:  Unremarkable. No pancreatic ductal dilatation or surrounding inflammatory changes. Spleen: Normal in size without focal abnormality. Adrenals/Urinary Tract: Adrenal glands are unremarkable. Kidneys are normal in size, without renal calculi or hydronephrosis. A 4.8 cm x 4.3 cm isodense soft tissue mass is seen within the posterior aspect of the mid to lower right kidney (axial CT images 27 through 41, CT series number 3). This represents a new finding compared to the prior study. Bladder is unremarkable. Stomach/Bowel: There is a large gastric hernia. Appendix appears normal. No evidence of bowel dilatation. Noninflamed diverticula are seen within the ascending, descending and sigmoid colon. Vascular/Lymphatic: There is marked severity calcification of the abdominal aorta. No enlarged abdominal or pelvic lymph nodes. Reproductive: Prostate radiation implantation seeds are seen within a mildly enlarged prostate gland. Other: A 3.1 cm x 3.9 cm fat containing paraumbilical hernia is seen on the right. Musculoskeletal: Marked severity multilevel degenerative changes seen throughout the lumbar spine. This is most prominent at the level of L2-L3. IMPRESSION: 1. 4.8 cm x 4.3 cm isodense soft tissue mass within the posterior aspect of the mid to lower right kidney which represents a new finding compared to the prior study. This is concerning for the presence of a renal cell carcinoma. Further evaluation with MRI is recommended. 2. Large gastric hernia. 3. Colonic diverticulosis. 4. Fat containing paraumbilical hernia. 5. Prostate radiation implantation seeds within a mildly enlarged prostate gland. 6. Marked severity multilevel degenerative changes throughout the lumbar spine. This is most prominent at the level of L2-L3. 7. Very small right pleural effusion. 8. Aortic atherosclerosis. Aortic Atherosclerosis (ICD10-I70.0). Electronically Signed   By: Virgina Norfolk M.D.   On: 11/06/2019 20:23   DG Chest 2 View  Result  Date: 11/06/2019 CLINICAL DATA:  Onset shortness of breath this morning. EXAM: CHEST - 2 VIEW COMPARISON:  Single-view of the chest 05/07/2017. FINDINGS: There is cardiomegaly and interstitial pulmonary edema with small bilateral  pleural effusions. No pneumothorax. IMPRESSION: Cardiomegaly and interstitial edema. Electronically Signed   By: Inge Rise M.D.   On: 11/06/2019 12:15   US RENAL  Result Date: 11/06/2019 CLINICAL DATA:  Acute kidney injury. EXAM: RENAL / URINARY TRACT ULTRASOUND COMPLETE COMPARISON:  None. FINDINGS: Right Kidney: Renal measurements: 10.7 x 5.2 x 5.1 cm = volume: 150 mL. Increased echogenicity of renal parenchyma is noted suggesting medical renal disease. 3.8 cm complex abnormality is seen involving the lower pole which may represent neoplasm. 7 mm simple cyst is seen in midpole. No hydronephrosis visualized. Left Kidney: Renal measurements: 10.7 x 5.7 x 4.7 cm = volume: 150 mL. Increased echogenicity of renal parenchyma is noted suggesting medical renal disease. No mass or hydronephrosis visualized. Bladder: Appears normal for degree of bladder distention. Other: None. IMPRESSION: Increased echogenicity of renal parenchyma is noted bilaterally suggesting medical renal disease. No hydronephrosis or renal obstruction is noted. 3.8 cm complex rounded abnormality is seen involving the lower pole of the right kidney which may represent complex cyst, but possible neoplasm cannot be excluded. Further evaluation with CT or MRI is recommended. Electronically Signed   By: Marijo Conception M.D.   On: 11/06/2019 16:30   ECHOCARDIOGRAM COMPLETE  Result Date: 11/07/2019    ECHOCARDIOGRAM REPORT   Patient Name:   [name] Date of Exam: 11/07/2019 Medical Rec #:  [id number]     Height:       67.0 in Accession #:    [id number]    Weight:       180.1 lb Date of Birth:  11/27/1928     BSA:          1.934 m Patient Age:    23 years      BP:           130/69 mmHg Patient Gender: M             HR:            60 bpm. Exam Location:  Inpatient Procedure: 2D Echo Indications:     CHF-Acute Diastolic 921.19 / E17.40  History:         Patient has prior history of Echocardiogram examinations, most                  recent 05/08/2017. CAD, COPD; Risk Factors:Hypertension,                  Diabetes, Dyslipidemia and Sleep Apnea. Acute renal failure,                  Renal cell carcinoma right kidney, GERD, Asthma.  Sonographer:     Darlina Sicilian RDCS Referring Phys:  [id number] Lequita Halt Diagnosing Phys: Adrian Prows MD IMPRESSIONS  1. Left ventricular ejection fraction, by estimation, is 65 to 70%. Left ventricular ejection fraction by PLAX is 64 %. The left ventricle has normal function. The left ventricle has no regional wall motion abnormalities. There is moderate left ventricular hypertrophy. Left ventricular diastolic parameters are consistent with Grade II diastolic dysfunction (pseudonormalization). Elevated left ventricular end-diastolic pressure.  2. Right ventricular systolic function is normal. The right ventricular size is normal.  3. The mitral valve is degenerative. No evidence of mitral valve regurgitation.  4. The aortic valve is tricuspid. Aortic valve regurgitation is not visualized. Conclusion(s)/Recommendation(s): Poor echo window. Wall motion with reduced sensitivity. Consider definity if clinically indicated. Presence of mitral apparatus makes diastolic dysfunction evaluation difficult. FINDINGS  Left Ventricle: Left  ventricular ejection fraction, by estimation, is 65 to 70%. Left ventricular ejection fraction by PLAX is 64 %. The left ventricle has normal function. The left ventricle has no regional wall motion abnormalities. The left ventricular internal cavity size was normal in size. There is moderate left ventricular hypertrophy. Left ventricular diastolic parameters are consistent with Grade II diastolic dysfunction (pseudonormalization). Elevated left ventricular end-diastolic pressure. Right  Ventricle: The right ventricular size is normal. No increase in right ventricular wall thickness. Right ventricular systolic function is normal. Left Atrium: Left atrial size was normal in size. Right Atrium: Right atrial size was normal in size. Pericardium: There is no evidence of pericardial effusion. Mitral Valve: The mitral valve is degenerative in appearance. There is mild calcification of the mitral valve leaflet(s). Mild mitral annular calcification. No evidence of mitral valve regurgitation. Tricuspid Valve: The tricuspid valve is normal in structure. Tricuspid valve regurgitation is not demonstrated. No evidence of tricuspid stenosis. Aortic Valve: The aortic valve is tricuspid. Aortic valve regurgitation is not visualized. Pulmonic Valve: The pulmonic valve was not well visualized. Pulmonic valve regurgitation is not visualized. Aorta: The aortic root is normal in size and structure. Venous: The inferior vena cava was not well visualized. IAS/Shunts: The interatrial septum was not well visualized.  LEFT VENTRICLE PLAX 2D LV EF:         Left            Diastology                ventricular     LV e' lateral:   4.46 cm/s                ejection        LV E/e' lateral: 19.1                fraction by     LV e' medial:    3.26 cm/s                PLAX is 64      LV E/e' medial:  26.1                %. LVIDd:         3.50 cm LVIDs:         2.30 cm LV PW:         1.30 cm LV IVS:        1.50 cm  RIGHT VENTRICLE TAPSE (M-mode): 1.9 cm LEFT ATRIUM           Index LA Vol (A2C): 54.0 ml 27.92 ml/m LA Vol (A4C): 54.9 ml 28.38 ml/m  AORTIC VALVE LVOT Vmax:   92.00 cm/s LVOT Vmean:  58.900 cm/s LVOT VTI:    0.201 m MITRAL VALVE MV Area (PHT): 2.34 cm     SHUNTS MV Decel Time: 324 msec     Systemic VTI: 0.20 m MV E velocity: 85.10 cm/s MV A velocity: 110.00 cm/s MV E/A ratio:  0.77 Adrian Prows MD Electronically signed by Adrian Prows MD Signature Date/Time: 11/07/2019/9:36:45 PM    Final    VAS [place] UPPER EXTREMITY VENOUS  DUPLEX  Result Date: 11/07/2019 UPPER VENOUS STUDY  Indications: Edema, and Pain Comparison Study: No prior studies. Performing Technologist: Darlin Coco  Examination Guidelines: A complete evaluation includes B-mode imaging, spectral Doppler, color Doppler, and power Doppler as needed of all accessible portions of each vessel. Bilateral testing is considered an integral part of a complete examination. Limited examinations for reoccurring  indications may be performed as noted.  Right Findings: +----------+------------+---------+-----------+----------+-------+ RIGHT     CompressiblePhasicitySpontaneousPropertiesSummary +----------+------------+---------+-----------+----------+-------+ Subclavian               Yes       Yes                      +----------+------------+---------+-----------+----------+-------+  Left Findings: +----------+------------+---------+-----------+----------+-------+ LEFT      CompressiblePhasicitySpontaneousPropertiesSummary +----------+------------+---------+-----------+----------+-------+ IJV           Full       Yes       Yes                      +----------+------------+---------+-----------+----------+-------+ Subclavian    Full       Yes       Yes                      +----------+------------+---------+-----------+----------+-------+ Axillary      Full       Yes       Yes                      +----------+------------+---------+-----------+----------+-------+ Brachial      Full                                          +----------+------------+---------+-----------+----------+-------+ Radial        Full                                          +----------+------------+---------+-----------+----------+-------+ Ulnar         Full                                          +----------+------------+---------+-----------+----------+-------+ Cephalic      Full                                           +----------+------------+---------+-----------+----------+-------+ Basilic       Full                                          +----------+------------+---------+-----------+----------+-------+  Summary:  Right: No evidence of thrombosis in the subclavian.  Left: No evidence of deep vein thrombosis in the upper extremity. No evidence of superficial vein thrombosis in the upper extremity.  *See table(s) above for measurements and observations.  Diagnosing physician: Servando Snare MD Electronically signed by Servando Snare MD on 11/07/2019 at 8:15:04 PM.    Final         Scheduled Meds: . amLODipine  5 mg Oral Daily  . aspirin EC  81 mg Oral Q supper  . brimonidine  1 drop Left Eye BID  . budesonide (PULMICORT) nebulizer solution  0.25 mg Nebulization BID  . Chlorhexidine Gluconate Cloth  6 each Topical Daily  . cholecalciferol  2,000 Units Oral Q supper  . donepezil  10 mg Oral Q supper  . escitalopram  5  mg Oral Q supper  . ferrous sulfate  325 mg Oral QPC supper  . finasteride  5 mg Oral Q supper  . heparin injection (subcutaneous)  5,000 Units Subcutaneous Q12H  . insulin aspart  0-9 Units Subcutaneous TID WC  . ipratropium-albuterol  3 mL Nebulization BID  . latanoprost  1 drop Both Eyes QHS  . loratadine  10 mg Oral Daily  . melatonin  5 mg Oral QHS  . memantine  10 mg Oral BID  . mirabegron ER  50 mg Oral Q supper  . montelukast  10 mg Oral Daily  . multivitamin with minerals  1 tablet Oral Daily  . pantoprazole  40 mg Oral BID  . [START ON 11/16/2019] predniSONE  7.5 mg Oral QAC breakfast  . sodium chloride flush  3 mL Intravenous Q12H   Continuous Infusions: . sodium chloride    . sodium chloride    . cefTRIAXone (ROCEPHIN)  IV       LOS: 2 days    Time spent: 25 minutes    Edwin Dada, MD [facility] 11/08/2019, 9:20 AM     Please page though [employer] or Epic secure chat:  For [employer], Adult nurse

## 2019-11-08 NOTE — Progress Notes (Signed)
[name] MRN:  [id number] DOB:  Nov 19, 1928 LOS: 2 ADMISSION DATE:  11/06/2019  CONSULTATION DATE:  11/06/2019 REFERRING MD:  Dr. Wynetta Fines  REASON FOR CONSULTATION:  Shortness of breath   Initial Pulmonary/Critical Care Consultation  Brief History   N/A  History of present illness   This 86 y.o. obese, Asian [place] male reformed smoker is seen in consultation at the request of Dr. Wynetta Fines for recommendations on further evaluation and management of shortness of breath.  He presented to the [facility] Emergency Department via EMS with complaints of dyspnea and respiratory distress.  The patient is quite hard of hearing.  The patient's daughter is at bedside and able to provide additional clinical information.  He apparently presented with increased work of breathing and wheezing, which improved with albuterol  He has COPD and is supposed to use Trilogy at home.  He also has a CPAP for OSA but admits to nonadherence to prescribed therapy.  He apparently reported increasing edema and generalized weakness.  He has also been experiencing increased mental "fogginess" (he has baseline dementia) and has been noted to be spending many more hours sleeping through the day than normal.  At the time of clinical interview, the patient is awake, alert and cooperative.  Hard of hearing.  Oriented to person and place.  Denies pain or discomfort.  In the ER, he has been getting furosemide in the setting of increased edema and abnormal serum creatinine on labs.  He also has been given hydralazine and Imdur for blood pressure control.  Past Medical History:  Diagnosis Date   Anemia    Asthma    Benign prostatic hypertrophy    Bilateral sensorineural hearing loss    CAD (coronary artery disease) 07/2003   a.  3.0 x 28 mm Taxus DES in the mid RCA in 2005   COPD (chronic obstructive pulmonary disease) (HCC)    Diverticulosis    DJD (degenerative joint disease)    DM  (diabetes mellitus) (HCC)    Dyslipidemia    GERD (gastroesophageal reflux disease)    Hemorrhoids    Hiatal hernia    Hypertension    OSA (obstructive sleep apnea)    cpap use   Osteoarthritis    Overactive bladder    Rhinitis    Seasonal allergies    Shingles    Stroke ([place])    TIA (transient ischemic attack) 12/2006   Dr. Leonie Man     [facility] Events   N/A   Consults:  PCCM Cardiology Orthopedics  Procedures:  N/A   Significant Diagnostic Tests:  CT abdomen/pelvis (7/7) shows 4.8 cm 4.3 cm isodense soft tissue mass in the right kidney; large gastric hernia; colonic diverticulosis; fat-containing periumbilical hernia; prostate radiation implantation seeds in a mildly enlarged prostate gland; severe, multilevel, degenerative changes throughout the lumbar spine; very small right pleural effusion; aortic atherosclerosis.  Renal ultrasound (7/7) shows increased echogenicity of renal parenchyma bilaterally.  No hydronephrosis.  No renal obstruction.  Complex rounded abnormality (3.8 cm) involving the lower pole of the right kidney.  Chest x-ray (7/7) shows increased interstitial markings and small, bilateral pleural effusions.   Micro Data:   Results for orders placed or performed during the hospital encounter of 11/06/19  SARS Coronavirus 2 by RT PCR (hospital order, performed in [facility] hospital lab) Nasopharyngeal Nasopharyngeal Swab     Status: None   Collection Time: 11/06/19  2:34 PM   Specimen: Nasopharyngeal  Swab  Result Value Ref Range Status   SARS Coronavirus 2 NEGATIVE NEGATIVE Final    Comment: (NOTE) SARS-CoV-2 target nucleic acids are NOT DETECTED.  The SARS-CoV-2 RNA is generally detectable in upper and lower respiratory specimens during the acute phase of infection. The lowest concentration of SARS-CoV-2 viral copies this assay can detect is 250 copies / mL. A negative result does not preclude SARS-CoV-2 infection and  should not be used as the sole basis for treatment or other patient management decisions.  A negative result may occur with improper specimen collection / handling, submission of specimen other than nasopharyngeal swab, presence of viral mutation(s) within the areas targeted by this assay, and inadequate number of viral copies (<250 copies / mL). A negative result must be combined with clinical observations, patient history, and epidemiological information.  Fact Sheet for Patients:   StrictlyIdeas.no  Fact Sheet for Healthcare Providers: BankingDealers.co.za  This test is not yet approved or  cleared by the [place] FDA and has been authorized for detection and/or diagnosis of SARS-CoV-2 by FDA under an Emergency Use Authorization (EUA).  This EUA will remain in effect (meaning this test can be used) for the duration of the COVID-19 declaration under Section 564(b)(1) of the Act, 21 U.S.C. section 360bbb-3(b)(1), unless the authorization is terminated or revoked sooner.  Performed at [facility], [place] [address], [place], [place] [address]   Body fluid culture     Status: None (Preliminary result)   Collection Time: 11/07/19  1:48 PM   Specimen: Fluid  Result Value Ref Range Status   Specimen Description FLUID SYNOVIAL CYSTS  Final   Special Requests NONE  Final   Gram Stain   Final    ABUNDANT WBC PRESENT,BOTH PMN AND MONONUCLEAR RARE GRAM POSITIVE COCCI CRITICAL RESULT CALLED TO, READ BACK BY AND VERIFIED WITH: L HUYNH RN 11/07/19 1724 JDW Performed at [facility], [place] [address], [place], [place] [address]    Culture PENDING  Incomplete   Report Status PENDING  Incomplete      Antimicrobials:  11/07/2019 Rocephin>> 11/07/2019 vancomycin discontinued   Interim history/subjective:  Currently on room air with sats of 100%   Objective   BP (!) 129/49 (BP Location: Right Arm)    Pulse 66    Temp (!) 97.5 F  (36.4 C) (Oral)    Resp 15    Ht 5\' 7"  (1.702 m)    Wt 82.5 kg    SpO2 100%    BMI 28.49 kg/m     Filed Weights   11/06/19 1122 11/07/19 0636 11/08/19 0420  Weight: 75 kg 81.7 kg 82.5 kg    Intake/Output Summary (Last 24 hours) at 11/08/2019 0811 Last data filed at 11/08/2019 0421 Gross per 24 hour  Intake 1240 ml  Output 1225 ml  Net 15 ml        Examination: General: Elderly male who is sleeping has wrist restraints in place intermittent confusion HEENT negative for JVD lymphadenopathy Neuro: Follows some commands extremely hard of hearing obviously confused CV: Heart sounds are distant  PULM: Currently on room air.  Sats 100%.  Tolerated BiPAP or CPAP intermittently during the night GI: soft, bsx4 active  GU: Voids Extremities: Left elbow wrap is in place. Skin: no rashes or lesions  Impression Plan COPD currently on room air without acute symptoms Obstructive sleep apnea with poor compliance Continue nebulizers Attempt noninvasive mechanical ventilatory support as tolerated   Septic olecranon bursitis left elbow  status post drainage per orthopedics With gram-positive cocci in fluid Currently on Rocephin Being followed by orthopedics  Acute on chronic diastolic congestive heart failure which is followed by cardiology Hypertension Per cardiology Hyperkalemia Recent Labs  Lab 11/07/19 0241 11/07/19 0245 11/08/19 0306  K 5.1 5.1 4.4     Acute on chronic renal failure Lab Results  Component Value Date   CREATININE 3.37 (H) 11/08/2019   CREATININE 2.70 (H) 11/07/2019   CREATININE 2.52 (H) 11/07/2019  note worsening creatinine May need to decrease diuresis   Dementia Per primary I suspect is poorly treated sleep apnea is leading to sleep deprivation which is exacerbating his dementia  Best practice:  Diet: diabetic Pain/Anxiety/Delirium protocol (if indicated): N/A VAP protocol (if indicated): N/A DVT prophylaxis: heparin GI prophylaxis:  Protonix Glucose control: sliding scale insulin Mobility/Activity: bed rest w/bathroom privileges CODE STATUS: DNR Family Communication:  patient's family (daughter) 11/08/2019 daughter updated at bedside Disposition: at the discretion of the admitting physician  Pulmonary critical care will sign off at this time.  Richardson Landry Keyden Pavlov ACNP Acute Care Nurse Practitioner [facility] Pulmonary/Critical Care Please consult Amion 11/08/2019, 8:12 AM

## 2019-11-08 NOTE — Progress Notes (Signed)
Physical Therapy Treatment Patient Details Name: [name] MRN: [id number] DOB: [birth year] Today's Date: 11/08/2019    History of Present Illness Pt is an 86 yo male presenting with increased SOB, workup revealed acute on chronic CHF decompensation with hyperkalemia, UTI, and AKI. PMH includes: dementia, CAD s/p stent placement, COPD, DM, HTN, and GERD.    PT Comments    Patient progressing with mobility ambulating in hallway this session.  Very unsteady and needing multiple cues and stops to gait balance as shuffling pattern and anterior bias.  Feel he will hopefully improve as medical issues resolve and be safe at home with family assist and follow up HHPT.   Follow Up Recommendations  Home health PT;Supervision/Assistance - 24 hour     Equipment Recommendations  Rolling walker with 5" wheels    Recommendations for Other Services       Precautions / Restrictions Precautions Precautions: Fall Precaution Comments: pt family reports fall every other week, moderate cog deficits at baseline Required Braces or Orthoses: Sling;Splint/Cast Splint/Cast: L arm posterior long arm splint, shoulder immobilizer    Mobility  Bed Mobility Overal bed mobility: Needs Assistance Bed Mobility: Supine to Sit     Supine to sit: Min assist     General bed mobility comments: assist to pull up with HHA on R  Transfers Overall transfer level: Needs assistance Equipment used: 1 person hand held assist Transfers: Sit to/from Stand Sit to Stand: Mod assist         General transfer comment: some lifting assist from EOB  Ambulation/Gait Ambulation/Gait assistance: Mod assist;Max assist Gait Distance (Feet): 75 Feet Assistive device: 1 person hand held assist Gait Pattern/deviations: Step-to pattern;Shuffle;Decreased stride length;Trunk flexed;Wide base of support;Ataxic     General Gait Details: broad based gait with difficulty progressing feet at times with anterior bias cues to stop  to regain balance prior to continuing, very short suffling gait pattern and heavy R UE support due to splint/sling on L   Stairs             Wheelchair Mobility    Modified Rankin (Stroke Patients Only)       Balance Overall balance assessment: Needs assistance   Sitting balance-Leahy Scale: Fair Sitting balance - Comments: EOB no UE support   Standing balance support: Single extremity supported Standing balance-Leahy Scale: Poor Standing balance comment: Heavy UE support on R in standing with time to stand to get balanced,                            Cognition Arousal/Alertness: Awake/alert Behavior During Therapy: WFL for tasks assessed/performed Overall Cognitive Status: Impaired/Different from baseline Area of Impairment: Following commands;Memory;Safety/judgement                     Memory: Decreased short-term memory Following Commands: Follows one step commands consistently;Follows one step commands with increased time Safety/Judgement: Decreased awareness of deficits   Problem Solving: Slow processing;Requires verbal cues        Exercises      General Comments General comments (skin integrity, edema, etc.): patient scratching L arm under splint, applied cream and adjusted ace wrap for comfort      Pertinent Vitals/Pain Pain Assessment: Faces Faces Pain Scale: Hurts a little bit Pain Location: gneralized with mobility Pain Descriptors / Indicators: Grimacing (no verbal c/o pain) Pain Intervention(s): Monitored during session    Home Living  Prior Function            PT Goals (current goals can now be found in the care plan section) Progress towards PT goals: Progressing toward goals    Frequency    Min 3X/week      PT Plan Current plan remains appropriate    Co-evaluation              AM-PAC PT "6 Clicks" Mobility   Outcome Measure  Help needed turning from your back to your  side while in a flat bed without using bedrails?: A Little Help needed moving from lying on your back to sitting on the side of a flat bed without using bedrails?: A Little Help needed moving to and from a bed to a chair (including a wheelchair)?: A Little Help needed standing up from a chair using your arms (e.g., wheelchair or bedside chair)?: A Lot Help needed to walk in hospital room?: A Lot Help needed climbing 3-5 steps with a railing? : A Lot 6 Click Score: 15    End of Session Equipment Utilized During Treatment: Gait belt;Other (comment) (sling) Activity Tolerance: Patient tolerated treatment well Patient left: in chair;with call bell/phone within reach;with chair alarm set   PT Visit Diagnosis: Repeated falls (R29.6);History of falling (Z91.81);Difficulty in walking, not elsewhere classified (R26.2)     Time: 8413-2440 PT Time Calculation (min) (ACUTE ONLY): 31 min  Charges:  $Gait Training: 8-22 mins $Therapeutic Activity: 8-22 mins                     Magda Kiel, PT Acute Rehabilitation Services Pager:[phone] Office:[phone] 11/08/2019    Reginia Naas 11/08/2019, 11:40 AM

## 2019-11-08 NOTE — Progress Notes (Signed)
Subjective:  Patient's son and wife present at the bedside.  Breathing has improved.  A little tired today.  Intake/Output from previous day:  I/O last 3 completed shifts: In: 034 [P.O.:720; IV Piggyback:150] Out: 2450 [Urine:2450] Total I/O In: 420 [P.O.:120; IV Piggyback:300] Out: 525 [Urine:525]  Blood pressure (!) 129/49, pulse 66, temperature (!) 97.5 F (36.4 C), temperature source Oral, resp. rate 15, height 5' 7"  (1.702 m), weight 82.5 kg, SpO2 100 %. Physical Exam Constitutional:      General: He is not in acute distress.    Appearance: He is not ill-appearing.  Neck:     Vascular: No JVD.  Cardiovascular:     Rate and Rhythm: Normal rate and regular rhythm.     Pulses:          Dorsalis pedis pulses are 1+ on the right side and 1+ on the left side.       Posterior tibial pulses are 1+ on the right side and 1+ on the left side.     Heart sounds: Normal heart sounds. No gallop.   Pulmonary:     Breath sounds: Examination of the right-lower field reveals decreased breath sounds and rales. Examination of the left-lower field reveals decreased breath sounds and rales. Decreased breath sounds and rales present.  Musculoskeletal:     Cervical back: Normal range of motion.    Lab Results: BNP (last 3 results) Recent Labs    11/06/19 1126 11/07/19 0241 11/08/19 0306  BNP 477.3* 377.8* 189.6*    ProBNP (last 3 results) No results for input(s): PROBNP in the last 8760 hours. BMP Latest Ref Rng & Units 11/08/2019 11/07/2019 11/07/2019  Glucose 70 - 99 mg/dL 205(H) 119(H) 123(H)  BUN 8 - 23 mg/dL 45(H) 37(H) 36(H)  Creatinine 0.61 - 1.24 mg/dL 3.37(H) 2.70(H) 2.52(H)  Sodium 135 - 145 mmol/L 138 140 139  Potassium 3.5 - 5.1 mmol/L 4.4 5.1 5.1  Chloride 98 - 111 mmol/L 101 103 105  CO2 22 - 32 mmol/L 25 - 23  Calcium 8.9 - 10.3 mg/dL 8.6(L) - 9.2   Hepatic Function Latest Ref Rng & Units 09/24/2014 02/25/2013 02/24/2013  Total Protein 6.5 - 8.1 g/dL 6.0(L) 6.0 4.7(L)   Albumin 3.5 - 5.0 g/dL 3.7 2.6(L) 2.4(L)  AST 15 - 41 U/L 20 16 10   ALT 17 - 63 U/L 27 14 10   Alk Phosphatase 38 - 126 U/L 48 75 61  Total Bilirubin 0.3 - 1.2 mg/dL 0.8 0.2(L) 0.5  Bilirubin, Direct 0.0 - 0.3 mg/dL - - -   CBC Latest Ref Rng & Units 11/08/2019 11/07/2019 11/07/2019  WBC 4.0 - 10.5 K/uL 12.7(H) - 11.5(H)  Hemoglobin 13.0 - 17.0 g/dL 8.2(L) 9.2(L) 8.7(L)  Hematocrit 39 - 52 % 26.5(L) 27.0(L) 28.3(L)  Platelets 150 - 400 K/uL 194 - 214   Lipid Panel     Component Value Date/Time   CHOL 109 01/03/2012 1305   TRIG 44.0 01/03/2012 1305   HDL 60.20 01/03/2012 1305   CHOLHDL 2 01/03/2012 1305   VLDL 8.8 01/03/2012 1305   LDLCALC 40 01/03/2012 1305   Cardiac Panel (last 3 results) No results for input(s): CKTOTAL, CKMB, TROPONINI, RELINDX in the last 72 hours.  HEMOGLOBIN A1C Lab Results  Component Value Date   HGBA1C 9.4 (H) 11/07/2019   MPG 223.08 11/07/2019   TSH Recent Labs    11/07/19 0241  TSH 3.370   Imaging: CT ABDOMEN PELVIS WO CONTRAST  Result Date: 11/06/2019 CLINICAL DATA:  Bilateral  leg swelling. EXAM: CT ABDOMEN AND PELVIS WITHOUT CONTRAST TECHNIQUE: Multidetector CT imaging of the abdomen and pelvis was performed following the standard protocol without IV contrast. COMPARISON:  Sep 10, 2009 FINDINGS: Lower chest: Mild to moderate severity atelectasis is seen within the posterior aspect of the right lung base. There is a very small right pleural effusion. Hepatobiliary: No focal liver abnormality is seen. No gallstones, gallbladder wall thickening, or biliary dilatation. Pancreas: Unremarkable. No pancreatic ductal dilatation or surrounding inflammatory changes. Spleen: Normal in size without focal abnormality. Adrenals/Urinary Tract: Adrenal glands are unremarkable. Kidneys are normal in size, without renal calculi or hydronephrosis. A 4.8 cm x 4.3 cm isodense soft tissue mass is seen within the posterior aspect of the mid to lower right kidney (axial CT  images 27 through 41, CT series number 3). This represents a new finding compared to the prior study. Bladder is unremarkable. Stomach/Bowel: There is a large gastric hernia. Appendix appears normal. No evidence of bowel dilatation. Noninflamed diverticula are seen within the ascending, descending and sigmoid colon. Vascular/Lymphatic: There is marked severity calcification of the abdominal aorta. No enlarged abdominal or pelvic lymph nodes. Reproductive: Prostate radiation implantation seeds are seen within a mildly enlarged prostate gland. Other: A 3.1 cm x 3.9 cm fat containing paraumbilical hernia is seen on the right. Musculoskeletal: Marked severity multilevel degenerative changes seen throughout the lumbar spine. This is most prominent at the level of L2-L3. IMPRESSION: 1. 4.8 cm x 4.3 cm isodense soft tissue mass within the posterior aspect of the mid to lower right kidney which represents a new finding compared to the prior study. This is concerning for the presence of a renal cell carcinoma. Further evaluation with MRI is recommended. 2. Large gastric hernia. 3. Colonic diverticulosis. 4. Fat containing paraumbilical hernia. 5. Prostate radiation implantation seeds within a mildly enlarged prostate gland. 6. Marked severity multilevel degenerative changes throughout the lumbar spine. This is most prominent at the level of L2-L3. 7. Very small right pleural effusion. 8. Aortic atherosclerosis. Aortic Atherosclerosis (ICD10-I70.0). Electronically Signed   By: Virgina Norfolk M.D.   On: 11/06/2019 20:23   DG Chest 2 View  Result Date: 11/06/2019 CLINICAL DATA:  Onset shortness of breath this morning. EXAM: CHEST - 2 VIEW COMPARISON:  Single-view of the chest 05/07/2017. FINDINGS: There is cardiomegaly and interstitial pulmonary edema with small bilateral pleural effusions. No pneumothorax. IMPRESSION: Cardiomegaly and interstitial edema. Electronically Signed   By: Inge Rise M.D.   On: 11/06/2019  12:15   US RENAL  Result Date: 11/06/2019 CLINICAL DATA:  Acute kidney injury. EXAM: RENAL / URINARY TRACT ULTRASOUND COMPLETE COMPARISON:  None. FINDINGS: Right Kidney: Renal measurements: 10.7 x 5.2 x 5.1 cm = volume: 150 mL. Increased echogenicity of renal parenchyma is noted suggesting medical renal disease. 3.8 cm complex abnormality is seen involving the lower pole which may represent neoplasm. 7 mm simple cyst is seen in midpole. No hydronephrosis visualized. Left Kidney: Renal measurements: 10.7 x 5.7 x 4.7 cm = volume: 150 mL. Increased echogenicity of renal parenchyma is noted suggesting medical renal disease. No mass or hydronephrosis visualized. Bladder: Appears normal for degree of bladder distention. Other: None. IMPRESSION: Increased echogenicity of renal parenchyma is noted bilaterally suggesting medical renal disease. No hydronephrosis or renal obstruction is noted. 3.8 cm complex rounded abnormality is seen involving the lower pole of the right kidney which may represent complex cyst, but possible neoplasm cannot be excluded. Further evaluation with CT or MRI is recommended. Electronically Signed  By: Marijo Conception M.D.   On: 11/06/2019 16:30   ECHOCARDIOGRAM COMPLETE  Result Date: 11/07/2019    ECHOCARDIOGRAM REPORT   Patient Name:   [name] Date of Exam: 11/07/2019 Medical Rec #:  [id number]     Height:       67.0 in Accession #:    [id number]    Weight:       180.1 lb Date of Birth:  February 22, 1929     BSA:          1.934 m Patient Age:    86 years      BP:           130/69 mmHg Patient Gender: M             HR:           60 bpm. Exam Location:  Inpatient Procedure: 2D Echo Indications:     CHF-Acute Diastolic 034.74 / Q59.56  History:         Patient has prior history of Echocardiogram examinations, most                  recent 05/08/2017. CAD, COPD; Risk Factors:Hypertension,                  Diabetes, Dyslipidemia and Sleep Apnea. Acute renal failure,                  Renal cell  carcinoma right kidney, GERD, Asthma.  Sonographer:     Darlina Sicilian RDCS Referring Phys:  [id number] Lequita Halt Diagnosing Phys: Adrian Prows MD IMPRESSIONS  1. Left ventricular ejection fraction, by estimation, is 65 to 70%. Left ventricular ejection fraction by PLAX is 64 %. The left ventricle has normal function. The left ventricle has no regional wall motion abnormalities. There is moderate left ventricular hypertrophy. Left ventricular diastolic parameters are consistent with Grade II diastolic dysfunction (pseudonormalization). Elevated left ventricular end-diastolic pressure.  2. Right ventricular systolic function is normal. The right ventricular size is normal.  3. The mitral valve is degenerative. No evidence of mitral valve regurgitation.  4. The aortic valve is tricuspid. Aortic valve regurgitation is not visualized. Conclusion(s)/Recommendation(s): Poor echo window. Wall motion with reduced sensitivity. Consider definity if clinically indicated. Presence of mitral apparatus makes diastolic dysfunction evaluation difficult. FINDINGS  Left Ventricle: Left ventricular ejection fraction, by estimation, is 65 to 70%. Left ventricular ejection fraction by PLAX is 64 %. The left ventricle has normal function. The left ventricle has no regional wall motion abnormalities. The left ventricular internal cavity size was normal in size. There is moderate left ventricular hypertrophy. Left ventricular diastolic parameters are consistent with Grade II diastolic dysfunction (pseudonormalization). Elevated left ventricular end-diastolic pressure. Right Ventricle: The right ventricular size is normal. No increase in right ventricular wall thickness. Right ventricular systolic function is normal. Left Atrium: Left atrial size was normal in size. Right Atrium: Right atrial size was normal in size. Pericardium: There is no evidence of pericardial effusion. Mitral Valve: The mitral valve is degenerative in appearance. There  is mild calcification of the mitral valve leaflet(s). Mild mitral annular calcification. No evidence of mitral valve regurgitation. Tricuspid Valve: The tricuspid valve is normal in structure. Tricuspid valve regurgitation is not demonstrated. No evidence of tricuspid stenosis. Aortic Valve: The aortic valve is tricuspid. Aortic valve regurgitation is not visualized. Pulmonic Valve: The pulmonic valve was not well visualized. Pulmonic valve regurgitation is not visualized. Aorta: The aortic root is normal  in size and structure. Venous: The inferior vena cava was not well visualized. IAS/Shunts: The interatrial septum was not well visualized.  LEFT VENTRICLE PLAX 2D LV EF:         Left            Diastology                ventricular     LV e' lateral:   4.46 cm/s                ejection        LV E/e' lateral: 19.1                fraction by     LV e' medial:    3.26 cm/s                PLAX is 64      LV E/e' medial:  26.1                %. LVIDd:         3.50 cm LVIDs:         2.30 cm LV PW:         1.30 cm LV IVS:        1.50 cm  RIGHT VENTRICLE TAPSE (M-mode): 1.9 cm LEFT ATRIUM           Index LA Vol (A2C): 54.0 ml 27.92 ml/m LA Vol (A4C): 54.9 ml 28.38 ml/m  AORTIC VALVE LVOT Vmax:   92.00 cm/s LVOT Vmean:  58.900 cm/s LVOT VTI:    0.201 m MITRAL VALVE MV Area (PHT): 2.34 cm     SHUNTS MV Decel Time: 324 msec     Systemic VTI: 0.20 m MV E velocity: 85.10 cm/s MV A velocity: 110.00 cm/s MV E/A ratio:  0.77 Adrian Prows MD Electronically signed by Adrian Prows MD Signature Date/Time: 11/07/2019/9:36:45 PM    Final    VAS [place] UPPER EXTREMITY VENOUS DUPLEX  Result Date: 11/07/2019 UPPER VENOUS STUDY  Indications: Edema, and Pain Comparison Study: No prior studies. Performing Technologist: Darlin Coco  Examination Guidelines: A complete evaluation includes B-mode imaging, spectral Doppler, color Doppler, and power Doppler as needed of all accessible portions of each vessel. Bilateral testing is considered an integral  part of a complete examination. Limited examinations for reoccurring indications may be performed as noted.  Right Findings: +----------+------------+---------+-----------+----------+-------+ RIGHT     CompressiblePhasicitySpontaneousPropertiesSummary +----------+------------+---------+-----------+----------+-------+ Subclavian               Yes       Yes                      +----------+------------+---------+-----------+----------+-------+  Left Findings: +----------+------------+---------+-----------+----------+-------+ LEFT      CompressiblePhasicitySpontaneousPropertiesSummary +----------+------------+---------+-----------+----------+-------+ IJV           Full       Yes       Yes                      +----------+------------+---------+-----------+----------+-------+ Subclavian    Full       Yes       Yes                      +----------+------------+---------+-----------+----------+-------+ Axillary      Full       Yes       Yes                      +----------+------------+---------+-----------+----------+-------+  Brachial      Full                                          +----------+------------+---------+-----------+----------+-------+ Radial        Full                                          +----------+------------+---------+-----------+----------+-------+ Ulnar         Full                                          +----------+------------+---------+-----------+----------+-------+ Cephalic      Full                                          +----------+------------+---------+-----------+----------+-------+ Basilic       Full                                          +----------+------------+---------+-----------+----------+-------+  Summary:  Right: No evidence of thrombosis in the subclavian.  Left: No evidence of deep vein thrombosis in the upper extremity. No evidence of superficial vein thrombosis in the upper extremity.   *See table(s) above for measurements and observations.  Diagnosing physician: Servando Snare MD Electronically signed by Servando Snare MD on 11/07/2019 at 8:15:04 PM.    Final     Scheduled Meds: . amLODipine  5 mg Oral Daily  . aspirin EC  81 mg Oral Q supper  . brimonidine  1 drop Left Eye BID  . budesonide (PULMICORT) nebulizer solution  0.25 mg Nebulization BID  . Chlorhexidine Gluconate Cloth  6 each Topical Daily  . cholecalciferol  2,000 Units Oral Q supper  . donepezil  10 mg Oral Q supper  . escitalopram  5 mg Oral Q supper  . ferrous sulfate  325 mg Oral QPC supper  . finasteride  5 mg Oral Q supper  . heparin injection (subcutaneous)  5,000 Units Subcutaneous Q12H  . insulin aspart  0-9 Units Subcutaneous TID WC  . ipratropium-albuterol  3 mL Nebulization Q6H  . latanoprost  1 drop Both Eyes QHS  . loratadine  10 mg Oral Daily  . melatonin  5 mg Oral QHS  . memantine  10 mg Oral BID  . mirabegron ER  50 mg Oral Q supper  . montelukast  10 mg Oral Daily  . multivitamin with minerals  1 tablet Oral Daily  . pantoprazole  40 mg Oral BID  . [START ON 11/16/2019] predniSONE  15 mg Oral QAC breakfast  . sodium chloride flush  3 mL Intravenous Q12H   Continuous Infusions: . sodium chloride    . sodium chloride    . cefTRIAXone (ROCEPHIN)  IV Stopped (11/07/19 2248)   PRN Meds:.sodium chloride, acetaminophen, albuterol, ondansetron (ZOFRAN) IV, sodium chloride flush   Cardiac Studies:    Echocardiogram 05/08/2017: - Left ventricle: There was mild concentric hypertrophy. Systolic  function was normal. The estimated ejection fraction was in the  range  of 55% to 60%. Wall motion was normal; there were no  regional wall motion abnormalities. Doppler parameters are  consistent with abnormal left ventricular relaxation (grade 1 astolic dysfunction). Doppler parameters are consistent with  both elevated ventricular end-diastolic filling pressure and  elevated left atrial  filling pressure.  - Mitral valve: Calcified annulus.  - Left atrium: The atrium was mildly dilated.  - Atrial septum: No defect or patent foramen ovale was identified.  EKG:  EKG 11/06/2019 at 11: 11 hours Marked sinus bradycardia at rate of 49 bpm, normal axis, normal QT interval.  No evident ischemia.   Tele: NSR  Assessment/Plan:  1.  Acute pulmonary edema and acute on chronic diastolic heart failure 2.  Acute renal failure superimposed on chronic stage III kidney disease 3.  Renal cell carcinoma right kidney, new finding by CT 4.  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  Left upper extremity edema and pain, due to olecranon bursitis, infected. Now on antibiotics  Recommendation: His BNP has improved, lungs are clear to auscultate today, leg edema is essentially resolved.  Renal function has deteriorated due to aggressive diuresis, got last dose of IV lasix last evening. Will discontinue furosemide and also give IVF 50 mL an hour for 8-10 hours for forced diuresis.    Follow-up on BMP and also BMP tomorrow (ordered)  He is presently 86 years of age, has multiple medical comorbidity, may not be a good candidate for surgical intervention or nephrectomy and could potentially manage him conservatively but could get urology opinion as well in the out patient setting.  I discussed with his son Dr. Berneta Sages.    Adrian Prows, MD, [facility] 11/08/2019, 6:47 AM Office: [phone]

## 2019-11-08 NOTE — Progress Notes (Signed)
Patient with septic olecranon bursitis. Will reassess clinically in am. Npo for now in case surgery indicated for Friday afternoon.

## 2019-11-08 NOTE — Consult Note (Signed)
Reason for Consult: AKI/CKD stage III Referring Physician: Loleta Books, MD  [name] is an 86 y.o. male with a PMH significant for CAD, DM, HTN, severe COPD, OSA on CPAP, dementia, GERD, CKD stage IIIa, and chronic diastolic CHF (grade I DD on ECHO in 2019 and EF 55-60%), who presented to [facility] ED on 11/06/19 with a one week history of worsening SOB and lower extremity edema.  In the ED, he was hypothermic at 97.4, bradycardic at 45, and labs were notable for K of 6.2, BUN 40, Cr 2.32, gluc 190, WBC 14.5, Hgb 9, lactic acid 3.5, BNP 477.3.  ECG was without significant changes and CXR revealed cardiomegaly with interstitial pulmonary edema with small bilateral pleural effusions. He was admitted for IV diuresis for acute on chronic CHF.  He was also noted to have an olecranon bursitis/cellulitis of his left arm with sepsis.  Ortho and ID consulted and he was started on IV vancomycin for MRSA coverage.  His hospitalization was also complicated by the development of AKI/CKD stage IIIa which is why were were consulted.  The trend in Scr is seen below.   His workup also included renal US which revealed increased echogenicity of kidneys bilaterally consistent with medical renal disease and no obstruction.  However, there was also a complex lesion in the lower pole of the right kidney.  CT scan of abd/pelvis without contrast revealed 4.8 cm x 4.3 cm isodense soft tissue mass concerning for renal cell carcinoma.  Trend in Creatinine: Creatinine, Ser  Date/Time Value Ref Range Status  11/08/2019 03:06 AM 3.37 (H) 0.61 - 1.24 mg/dL Final  11/07/2019 02:45 AM 2.70 (H) 0.61 - 1.24 mg/dL Final  11/07/2019 02:41 AM 2.52 (H) 0.61 - 1.24 mg/dL Final  11/06/2019 11:26 AM 2.32 (H) 0.61 - 1.24 mg/dL Final  05/09/2017 05:34 AM 1.17 0.61 - 1.24 mg/dL Final  05/08/2017 01:50 AM 1.19 0.61 - 1.24 mg/dL Final  05/07/2017 04:52 PM 1.39 (H) 0.61 - 1.24 mg/dL Final  02/23/2016 12:00 PM 1.36 (H) 0.61 - 1.24 mg/dL Final  09/24/2014  02:15 PM 1.03 0.61 - 1.24 mg/dL Final  02/27/2013 06:20 AM 0.70 0.50 - 1.35 mg/dL Final  02/25/2013 05:45 AM 1.16 0.50 - 1.35 mg/dL Final  02/24/2013 10:10 AM 1.82 (H) 0.50 - 1.35 mg/dL Final  01/05/2013 04:32 AM 0.97 0.50 - 1.35 mg/dL Final  01/04/2013 03:18 PM 0.94 0.50 - 1.35 mg/dL Final  01/03/2012 01:05 PM 1.3 0.40 - 1.50 mg/dL Final  09/12/2009 04:14 AM 1.03 0.40 - 1.50 mg/dL Final  09/10/2009 12:31 PM 1.42 0.40 - 1.50 mg/dL Final  04/05/2007 11:50 AM 1.19  Final  12/06/2006 05:15 PM 1.06  Final    PMH:   Past Medical History:  Diagnosis Date  . Anemia   . Asthma   . Benign prostatic hypertrophy   . Bilateral sensorineural hearing loss   . CAD (coronary artery disease) 07/2003   a.  3.0 x 28 mm Taxus DES in the mid RCA in 2005  . COPD (chronic obstructive pulmonary disease) ([place])   . Diverticulosis   . DJD (degenerative joint disease)   . DM (diabetes mellitus) ([place])   . Dyslipidemia   . GERD (gastroesophageal reflux disease)   . Hemorrhoids   . Hiatal hernia   . Hypertension   . OSA (obstructive sleep apnea)    cpap use  . Osteoarthritis   . Overactive bladder   . Rhinitis   . Seasonal allergies   . Shingles   .  Stroke ([place])   . TIA (transient ischemic attack) 12/2006   Dr. Leonie Man    PSH:   Past Surgical History:  Procedure Laterality Date  . BACK SURGERY     lower back  . Cataract surgery     1 eye done.  . CORONARY ANGIOPLASTY WITH STENT PLACEMENT  07/2003   Est. EF of 65% with stent to the RCA, by Dr. Acie Fredrickson  . CYSTOSCOPY WITH INSERTION OF UROLIFT N/A 02/25/2016   Procedure: CYSTOSCOPY WITH INSERTION OF UROLIFT x6;  Surgeon: Cleon Gustin, MD;  Location: [facility] ORS;  Service: Urology;  Laterality: N/A;  1 HOUR    . INNER EAR SURGERY    . JOINT REPLACEMENT Right   . L5 laminectomy  2004   Dr. Sherwood Gambler  . right total knee  2006   Dr. Maureen Ralphs    Allergies:  Allergies  Allergen Reactions  . Ciprofloxacin Hcl Other (See Comments)    Unknown  reaction  . Mirabegron Other (See Comments)    FATIGUE  . Penicillins     Has patient had a PCN reaction causing immediate rash, facial/tongue/throat swelling, SOB or lightheadedness with hypotension: Unknown Has patient had a PCN reaction causing severe rash involving mucus membranes or skin necrosis: Unknown Has patient had a PCN reaction that required hospitalization Unknown Has patient had a PCN reaction occurring within the last 10 years: Unknown If all of the above answers are "NO", then may proceed with Cephalosporin use.   . Sulfa Antibiotics Hives and Rash  . Sulfamethoxazole Rash  . Sulfonamide Derivatives Rash    Medications:   Prior to Admission medications   Medication Sig Start Date End Date Taking? Authorizing Provider  albuterol (PROVENTIL HFA;VENTOLIN HFA) 108 (90 Base) MCG/ACT inhaler Inhale 2 puffs into the lungs every 6 (six) hours as needed for wheezing or shortness of breath.   Yes [provider]  albuterol (PROVENTIL) (2.5 MG/3ML) 0.083% nebulizer solution Take 2.5 mg by nebulization 2 (two) times daily.    Yes [provider]  amLODipine (NORVASC) 5 MG tablet Take 5 mg by mouth daily.    Yes [provider]  amoxicillin (AMOXIL) 500 MG capsule Take 2,000 mg by mouth See admin instructions. Take 4 capsules (2000 mg) by mouth one hour prior to dental appointment (last appointment November 2018) 03/10/17  Yes [provider]  aspirin EC 81 MG tablet Take 81 mg by mouth daily with supper.   Yes [provider]  atorvastatin (LIPITOR) 20 MG tablet Take 20 mg by mouth daily.    Yes [provider]  brimonidine (ALPHAGAN) 0.15 % ophthalmic solution Place 1 drop into the left eye 2 (two) times daily. 10/29/19  Yes [provider]  Cholecalciferol (VITAMIN D) 2000 units CAPS Take 2,000 Units by mouth daily with supper.    Yes [provider]  clopidogrel (PLAVIX) 75 MG tablet Take 1 tablet (75 mg total) by  mouth daily. 02/29/16  Yes McKenzie, Candee Furbish, MD  donepezil (ARICEPT) 10 MG tablet Take 10 mg by mouth daily with supper. 04/08/17  Yes [provider]  escitalopram (LEXAPRO) 5 MG tablet Take 5 mg by mouth daily with supper. 02/13/17  Yes [provider]  finasteride (PROSCAR) 5 MG tablet Take 5 mg by mouth daily with supper.  04/16/15  Yes [provider]  Fluticasone-Umeclidin-Vilant (TRELEGY ELLIPTA) 100-62.5-25 MCG/INH AEPB Inhale 1 puff into the lungs daily.   Yes [provider]  insulin lispro (HUMALOG KWIKPEN) 100  UNIT/ML KiwkPen Inject 2-5 Units into the skin See admin instructions. 2-5 units per sliding scale   Yes [provider]  iron polysaccharides (NIFEREX) 150 MG capsule Take 150 mg by mouth every other day.   Yes [provider]  latanoprost (XALATAN) 0.005 % ophthalmic solution Place 1 drop into both eyes at bedtime. 06/20/19  Yes [provider]  loratadine (CLARITIN) 10 MG tablet Take 10 mg by mouth daily.    Yes [provider]  memantine (NAMENDA) 10 MG tablet Take 10 mg by mouth 2 (two) times daily. 03/18/17  Yes [provider]  mirabegron ER (MYRBETRIQ) 50 MG TB24 tablet Take 50 mg by mouth daily with supper.   Yes [provider]  montelukast (SINGULAIR) 10 MG tablet Take 10 mg by mouth daily.    Yes [provider]  Multiple Vitamin (MULTIVITAMIN WITH MINERALS) TABS tablet Take 1 tablet by mouth daily.   Yes [provider]  nebivolol (BYSTOLIC) 5 MG tablet Take 5 mg by mouth daily.   Yes [provider]  nitroGLYCERIN (NITROSTAT) 0.4 MG SL tablet Place 0.4 mg under the tongue every 5 (five) minutes as needed for chest pain.    Yes [provider]  pantoprazole (PROTONIX) 40 MG tablet Take 40 mg by mouth 2 (two) times daily.    Yes [provider]  predniSONE (DELTASONE) 5 MG tablet Take 7.5 mg by mouth daily with breakfast.    Yes  [provider]  sitaGLIPtin-metformin (JANUMET) 50-1000 MG tablet Take 1 tablet by mouth 2 (two) times daily with a meal.     Yes [provider]  telmisartan (MICARDIS) 80 MG tablet Take 80 mg by mouth daily with supper.    Yes [provider]  vitamin C (ASCORBIC ACID) 500 MG tablet Take 500 mg by mouth daily with supper.    Yes [provider]  cefUROXime (CEFTIN) 500 MG tablet Take 1 tablet (500 mg total) by mouth 2 (two) times daily with a meal. Patient not taking: Reported on 11/06/2019 05/10/17   Geradine Girt, DO  ferrous sulfate 324 (65 Fe) MG TBEC Take 324 mg by mouth daily with supper. Patient not taking: Reported on 11/06/2019    [provider]  Melatonin 5 MG TABS Take 5 mg by mouth at bedtime. Patient not taking: Reported on 11/06/2019    [provider]    Inpatient medications: . amLODipine  5 mg Oral Daily  . aspirin EC  81 mg Oral Q supper  . brimonidine  1 drop Left Eye BID  . budesonide (PULMICORT) nebulizer solution  0.25 mg Nebulization BID  . Chlorhexidine Gluconate Cloth  6 each Topical Daily  . cholecalciferol  2,000 Units Oral Q supper  . donepezil  10 mg Oral Q supper  . doxycycline  100 mg Oral Q12H  . escitalopram  5 mg Oral Q supper  . ferrous sulfate  325 mg Oral QPC supper  . finasteride  5 mg Oral Q supper  . heparin injection (subcutaneous)  5,000 Units Subcutaneous Q12H  . insulin aspart  0-9 Units Subcutaneous TID WC  . ipratropium-albuterol  3 mL Nebulization BID  . latanoprost  1 drop Both Eyes QHS  . loratadine  10 mg Oral Daily  . melatonin  5 mg Oral QHS  . memantine  10 mg Oral BID  . mirabegron ER  50 mg Oral Q supper  . montelukast  10 mg Oral Daily  . multivitamin  with minerals  1 tablet Oral Daily  . pantoprazole  40 mg Oral BID  . [START ON 11/16/2019] predniSONE  7.5 mg Oral QAC breakfast  . sodium chloride flush  3 mL Intravenous Q12H    Discontinued Meds:   Medications  Discontinued During This Encounter  Medication Reason  . enoxaparin (LOVENOX) injection 30 mg   . nebivolol (BYSTOLIC) tablet 5 mg   . furosemide (LASIX) injection 40 mg   . atorvastatin (LIPITOR) tablet 20 mg   . clopidogrel (PLAVIX) tablet 75 mg   . ferrous sulfate TBEC 324 mg   . albuterol (PROVENTIL) (2.5 MG/3ML) 0.083% nebulizer solution 0.83 mg   . Fluticasone-Umeclidin-Vilant 100-62.5-25 MCG/INH AEPB 1 puff   . isosorbide mononitrate (IMDUR) 24 hr tablet 30 mg   . hydrALAZINE (APRESOLINE) tablet 25 mg   . furosemide (LASIX) injection 80 mg   . vancomycin (VANCOCIN) IVPB 1000 mg/200 mL premix   . doxycycline (VIBRA-TABS) tablet 100 mg   . furosemide (LASIX) injection 40 mg   . vancomycin (VANCOCIN) IVPB 1000 mg/200 mL premix   . predniSONE (DELTASONE) tablet 15 mg   . furosemide (LASIX) tablet 40 mg   . albuterol (PROVENTIL) (2.5 MG/3ML) 0.083% nebulizer solution 3 mL   . ipratropium-albuterol (DUONEB) 0.5-2.5 (3) MG/3ML nebulizer solution 3 mL   . albuterol (PROVENTIL) (2.5 MG/3ML) 0.083% nebulizer solution 3 mL   . cefTRIAXone (ROCEPHIN) 1 g in sodium chloride 0.9 % 100 mL IVPB     Social History:  reports that he quit smoking about 31 years ago. His smoking use included cigarettes. He has a 43.00 pack-year smoking history. He has never used smokeless tobacco. He reports current alcohol use. He reports that he does not use drugs.  Family History:   Family History  Problem Relation Age of Onset  . Lung disease Father   . Asthma Father   . Sudden death Mother        unknown causes  . Lung disease Brother   . Asthma Brother   . Sudden death Brother 61       unknown causes  . Sudden death Sister        unknown causes  . Sudden death Sister        unknown causes    Pertinent items are noted in HPI. Weight change: 7.5 kg  Intake/Output Summary (Last 24 hours) at 11/08/2019 1637 Last data filed at 11/08/2019 0421 Gross per 24 hour  Intake 660 ml  Output 525 ml  Net  135 ml   BP (!) 146/66 (BP Location: Right Arm)   Pulse 88   Temp 98.1 F (36.7 C) (Oral)   Resp 19   Ht 5\' 7"  (1.702 m)   Wt 82.5 kg   SpO2 100%   BMI 28.49 kg/m  Vitals:   11/08/19 0848 11/08/19 0854 11/08/19 1114 11/08/19 1500  BP:  (!) 168/64 (!) 127/57 (!) 146/66  Pulse:  64  88  Resp:  15  19  Temp:  (!) 97.2 F (36.2 C)  98.1 F (36.7 C)  TempSrc:  Axillary  Oral  SpO2: 99% 100%  100%  Weight:      Height:         General appearance: alert, cooperative and no distress Head: Normocephalic, without obvious abnormality, atraumatic Eyes: negative findings: lids and lashes normal, conjunctivae and sclerae normal and corneas clear Resp: diminished breath sounds bibasilar Cardio: no rub GI: soft, non-tender; bowel sounds normal; no masses,  no organomegaly  Extremities: edema trace pretibial and presacral and left arm in splint with wrapping  Labs: Basic Metabolic Panel: Recent Labs  Lab 11/06/19 1126 11/06/19 1420 11/06/19 1833 11/07/19 0241 11/07/19 0245 11/08/19 0306  NA 137  --  139 139 140 138  K 6.2* 6.4* 5.4* 5.1 5.1 4.4  CL 106  --   --  105 103 101  CO2 19*  --   --  23  --  25  GLUCOSE 190*  --   --  123* 119* 205*  BUN 40*  --   --  36* 37* 45*  CREATININE 2.32*  --   --  2.52* 2.70* 3.37*  CALCIUM 9.0  --   --  9.2  --  8.6*   Liver Function Tests: No results for input(s): AST, ALT, ALKPHOS, BILITOT, PROT, ALBUMIN in the last 168 hours. No results for input(s): LIPASE, AMYLASE in the last 168 hours. No results for input(s): AMMONIA in the last 168 hours. CBC: Recent Labs  Lab 11/06/19 1126 11/06/19 1126 11/06/19 1833 11/07/19 0241 11/07/19 0245 11/08/19 0306  WBC 14.5*  --   --  11.5*  --  12.7*  HGB 9.0*   < > 8.5* 8.7* 9.2* 8.2*  HCT 28.9*   < > 25.0* 28.3* 27.0* 26.5*  MCV 79.8*  --   --  78.6*  --  77.5*  PLT 214  --   --  214  --  194   < > = values in this interval not displayed.   PT/INR: @LABRCNTIP (inr:5) Cardiac  Enzymes: )No results for input(s): CKTOTAL, CKMB, CKMBINDEX, TROPONINI in the last 168 hours. CBG: Recent Labs  Lab 11/07/19 1204 11/07/19 1632 11/07/19 2121 11/08/19 0604 11/08/19 1116  GLUCAP 140* 177* 179* 178* 185*    Iron Studies:  Recent Labs  Lab 11/07/19 0241  IRON 38*  TIBC 295  FERRITIN 60    Xrays/Other Studies: CT ABDOMEN PELVIS WO CONTRAST  Result Date: 11/06/2019 CLINICAL DATA:  Bilateral leg swelling. EXAM: CT ABDOMEN AND PELVIS WITHOUT CONTRAST TECHNIQUE: Multidetector CT imaging of the abdomen and pelvis was performed following the standard protocol without IV contrast. COMPARISON:  Sep 10, 2009 FINDINGS: Lower chest: Mild to moderate severity atelectasis is seen within the posterior aspect of the right lung base. There is a very small right pleural effusion. Hepatobiliary: No focal liver abnormality is seen. No gallstones, gallbladder wall thickening, or biliary dilatation. Pancreas: Unremarkable. No pancreatic ductal dilatation or surrounding inflammatory changes. Spleen: Normal in size without focal abnormality. Adrenals/Urinary Tract: Adrenal glands are unremarkable. Kidneys are normal in size, without renal calculi or hydronephrosis. A 4.8 cm x 4.3 cm isodense soft tissue mass is seen within the posterior aspect of the mid to lower right kidney (axial CT images 27 through 41, CT series number 3). This represents a new finding compared to the prior study. Bladder is unremarkable. Stomach/Bowel: There is a large gastric hernia. Appendix appears normal. No evidence of bowel dilatation. Noninflamed diverticula are seen within the ascending, descending and sigmoid colon. Vascular/Lymphatic: There is marked severity calcification of the abdominal aorta. No enlarged abdominal or pelvic lymph nodes. Reproductive: Prostate radiation implantation seeds are seen within a mildly enlarged prostate gland. Other: A 3.1 cm x 3.9 cm fat containing paraumbilical hernia is seen on the  right. Musculoskeletal: Marked severity multilevel degenerative changes seen throughout the lumbar spine. This is most prominent at the level of L2-L3. IMPRESSION: 1. 4.8 cm x 4.3 cm isodense soft tissue mass within  the posterior aspect of the mid to lower right kidney which represents a new finding compared to the prior study. This is concerning for the presence of a renal cell carcinoma. Further evaluation with MRI is recommended. 2. Large gastric hernia. 3. Colonic diverticulosis. 4. Fat containing paraumbilical hernia. 5. Prostate radiation implantation seeds within a mildly enlarged prostate gland. 6. Marked severity multilevel degenerative changes throughout the lumbar spine. This is most prominent at the level of L2-L3. 7. Very small right pleural effusion. 8. Aortic atherosclerosis. Aortic Atherosclerosis (ICD10-I70.0). Electronically Signed   By: Virgina Norfolk M.D.   On: 11/06/2019 20:23   ECHOCARDIOGRAM COMPLETE  Result Date: 11/07/2019    ECHOCARDIOGRAM REPORT   Patient Name:   [name] Date of Exam: 11/07/2019 Medical Rec #:  [id number]     Height:       67.0 in Accession #:    [id number]    Weight:       180.1 lb Date of Birth:  11-28-1928     BSA:          1.934 m Patient Age:    17 years      BP:           130/69 mmHg Patient Gender: M             HR:           60 bpm. Exam Location:  Inpatient Procedure: 2D Echo Indications:     CHF-Acute Diastolic 655.37 / S82.70  History:         Patient has prior history of Echocardiogram examinations, most                  recent 05/08/2017. CAD, COPD; Risk Factors:Hypertension,                  Diabetes, Dyslipidemia and Sleep Apnea. Acute renal failure,                  Renal cell carcinoma right kidney, GERD, Asthma.  Sonographer:     Darlina Sicilian RDCS Referring Phys:  [id number] Lequita Halt Diagnosing Phys: Adrian Prows MD IMPRESSIONS  1. Left ventricular ejection fraction, by estimation, is 65 to 70%. Left ventricular ejection fraction by PLAX is 64 %.  The left ventricle has normal function. The left ventricle has no regional wall motion abnormalities. There is moderate left ventricular hypertrophy. Left ventricular diastolic parameters are consistent with Grade II diastolic dysfunction (pseudonormalization). Elevated left ventricular end-diastolic pressure.  2. Right ventricular systolic function is normal. The right ventricular size is normal.  3. The mitral valve is degenerative. No evidence of mitral valve regurgitation.  4. The aortic valve is tricuspid. Aortic valve regurgitation is not visualized. Conclusion(s)/Recommendation(s): Poor echo window. Wall motion with reduced sensitivity. Consider definity if clinically indicated. Presence of mitral apparatus makes diastolic dysfunction evaluation difficult. FINDINGS  Left Ventricle: Left ventricular ejection fraction, by estimation, is 65 to 70%. Left ventricular ejection fraction by PLAX is 64 %. The left ventricle has normal function. The left ventricle has no regional wall motion abnormalities. The left ventricular internal cavity size was normal in size. There is moderate left ventricular hypertrophy. Left ventricular diastolic parameters are consistent with Grade II diastolic dysfunction (pseudonormalization). Elevated left ventricular end-diastolic pressure. Right Ventricle: The right ventricular size is normal. No increase in right ventricular wall thickness. Right ventricular systolic function is normal. Left Atrium: Left atrial size was normal in size. Right Atrium: Right atrial size was  normal in size. Pericardium: There is no evidence of pericardial effusion. Mitral Valve: The mitral valve is degenerative in appearance. There is mild calcification of the mitral valve leaflet(s). Mild mitral annular calcification. No evidence of mitral valve regurgitation. Tricuspid Valve: The tricuspid valve is normal in structure. Tricuspid valve regurgitation is not demonstrated. No evidence of tricuspid stenosis.  Aortic Valve: The aortic valve is tricuspid. Aortic valve regurgitation is not visualized. Pulmonic Valve: The pulmonic valve was not well visualized. Pulmonic valve regurgitation is not visualized. Aorta: The aortic root is normal in size and structure. Venous: The inferior vena cava was not well visualized. IAS/Shunts: The interatrial septum was not well visualized.  LEFT VENTRICLE PLAX 2D LV EF:         Left            Diastology                ventricular     LV e' lateral:   4.46 cm/s                ejection        LV E/e' lateral: 19.1                fraction by     LV e' medial:    3.26 cm/s                PLAX is 64      LV E/e' medial:  26.1                %. LVIDd:         3.50 cm LVIDs:         2.30 cm LV PW:         1.30 cm LV IVS:        1.50 cm  RIGHT VENTRICLE TAPSE (M-mode): 1.9 cm LEFT ATRIUM           Index LA Vol (A2C): 54.0 ml 27.92 ml/m LA Vol (A4C): 54.9 ml 28.38 ml/m  AORTIC VALVE LVOT Vmax:   92.00 cm/s LVOT Vmean:  58.900 cm/s LVOT VTI:    0.201 m MITRAL VALVE MV Area (PHT): 2.34 cm     SHUNTS MV Decel Time: 324 msec     Systemic VTI: 0.20 m MV E velocity: 85.10 cm/s MV A velocity: 110.00 cm/s MV E/A ratio:  0.77 Adrian Prows MD Electronically signed by Adrian Prows MD Signature Date/Time: 11/07/2019/9:36:45 PM    Final    VAS [place] UPPER EXTREMITY VENOUS DUPLEX  Result Date: 11/07/2019 UPPER VENOUS STUDY  Indications: Edema, and Pain Comparison Study: No prior studies. Performing Technologist: Darlin Coco  Examination Guidelines: A complete evaluation includes B-mode imaging, spectral Doppler, color Doppler, and power Doppler as needed of all accessible portions of each vessel. Bilateral testing is considered an integral part of a complete examination. Limited examinations for reoccurring indications may be performed as noted.  Right Findings: +----------+------------+---------+-----------+----------+-------+ RIGHT     CompressiblePhasicitySpontaneousPropertiesSummary  +----------+------------+---------+-----------+----------+-------+ Subclavian               Yes       Yes                      +----------+------------+---------+-----------+----------+-------+  Left Findings: +----------+------------+---------+-----------+----------+-------+ LEFT      CompressiblePhasicitySpontaneousPropertiesSummary +----------+------------+---------+-----------+----------+-------+ IJV           Full       Yes       Yes                      +----------+------------+---------+-----------+----------+-------+  Subclavian    Full       Yes       Yes                      +----------+------------+---------+-----------+----------+-------+ Axillary      Full       Yes       Yes                      +----------+------------+---------+-----------+----------+-------+ Brachial      Full                                          +----------+------------+---------+-----------+----------+-------+ Radial        Full                                          +----------+------------+---------+-----------+----------+-------+ Ulnar         Full                                          +----------+------------+---------+-----------+----------+-------+ Cephalic      Full                                          +----------+------------+---------+-----------+----------+-------+ Basilic       Full                                          +----------+------------+---------+-----------+----------+-------+  Summary:  Right: No evidence of thrombosis in the subclavian.  Left: No evidence of deep vein thrombosis in the upper extremity. No evidence of superficial vein thrombosis in the upper extremity.  *See table(s) above for measurements and observations.  Diagnosing physician: Servando Snare MD Electronically signed by Servando Snare MD on 11/07/2019 at 8:15:04 PM.    Final      Assessment/Plan: 1.  Non-oliguric AKI/CKD stage IIIa- in setting of acute on  chronic diastolic CHF and IV diuresis as well as septic olecranon bursitis.  He did receive IV vanco 1 gram on 11/07/19.  Now on rocephin.  Renal US without obstruction and c/w chronic medical renal disease.  Likely due cardiorenal syndrome vs ischemic ATN.   1. Hold IV lasix for now since volume has improved 2. Continue to follow renal function and UOP 3. Renal dose meds 4. Avoid IV contrast and nephrotoxic agents. 2. Acute on chronic diastolic CHF- now grade II DD on ECHO.  Responding to IV lasix. 3. Septic left olecranon bursitis with cellulitis- +staph aureus on gram stain.  Given one dose of vanco and now on rocephin.  Ortho following. 4. Hyperkalemia- improved with lokelma and IV lasix 5. Renal mass- right kidney, soft tissue mass concerning for RCC.  Will need Urology evaluation. 6. DM- per primary 7. COPD- improved with bronchodilators.  On chronic prednisone 8. OSA on CPAP qhs 9. Anemia of CKD and iron deficiency.  TSAT 13%, ferritin 60, iron 38.  Will dose IV feraheme if ok with ID and likely  need to start ESA.  Transfuse prn 10. HTN- stable 11. Dementia- stable and family at bedside. 12. CAD- stable 13. Disposition- pt is DNR.     [name] 11/08/2019, 4:37 PM

## 2019-11-09 LAB — RENAL FUNCTION PANEL
Albumin: 2.3 g/dL — ABNORMAL LOW (ref 3.5–5.0)
Anion gap: 14 (ref 5–15)
BUN: 48 mg/dL — ABNORMAL HIGH (ref 8–23)
CO2: 22 mmol/L (ref 22–32)
Calcium: 8.3 mg/dL — ABNORMAL LOW (ref 8.9–10.3)
Chloride: 103 mmol/L (ref 98–111)
Creatinine, Ser: 3.09 mg/dL — ABNORMAL HIGH (ref 0.61–1.24)
GFR calc Af Amer: 20 mL/min — ABNORMAL LOW (ref >60)
GFR calc non Af Amer: 17 mL/min — ABNORMAL LOW (ref >60)
Glucose, Bld: 191 mg/dL — ABNORMAL HIGH (ref 70–99)
Phosphorus: 6.2 mg/dL — ABNORMAL HIGH (ref 2.5–4.6)
Potassium: 4 mmol/L (ref 3.5–5.1)
Sodium: 139 mmol/L (ref 135–145)

## 2019-11-09 LAB — GLUCOSE, CAPILLARY
Glucose-Capillary: 155 mg/dL — ABNORMAL HIGH (ref 70–99)
Glucose-Capillary: 260 mg/dL — ABNORMAL HIGH (ref 70–99)
Glucose-Capillary: 101 mg/dL — ABNORMAL HIGH (ref 70–99)
Glucose-Capillary: 152 mg/dL — ABNORMAL HIGH (ref 70–99)

## 2019-11-09 LAB — CBC
HCT: 27.1 % — ABNORMAL LOW (ref 39.0–52.0)
Hemoglobin: 8.5 g/dL — ABNORMAL LOW (ref 13.0–17.0)
MCH: 24.5 pg — ABNORMAL LOW (ref 26.0–34.0)
MCHC: 31.4 g/dL (ref 30.0–36.0)
MCV: 78.1 fL — ABNORMAL LOW (ref 80.0–100.0)
Platelets: 220 10*3/uL (ref 150–400)
RBC: 3.47 MIL/uL — ABNORMAL LOW (ref 4.22–5.81)
RDW: 17.2 % — ABNORMAL HIGH (ref 11.5–15.5)
WBC: 12.9 10*3/uL — ABNORMAL HIGH (ref 4.0–10.5)
nRBC: 0.2 % (ref 0.0–0.2)

## 2019-11-09 LAB — BRAIN NATRIURETIC PEPTIDE: B Natriuretic Peptide: 149.5 pg/mL — ABNORMAL HIGH (ref 0.0–100.0)

## 2019-11-09 MED ORDER — INSULIN ASPART 100 UNIT/ML ~~LOC~~ SOLN
0.0000 [IU] | Freq: Three times a day (TID) | SUBCUTANEOUS | Status: DC
  Administered 2019-11-10 (×3): 2 [IU] via SUBCUTANEOUS

## 2019-11-09 MED ORDER — CETIRIZINE HCL 10 MG PO TABS
10.0000 mg | ORAL_TABLET | ORAL | Status: AC
  Administered 2019-11-09: 10 mg via ORAL
  Filled 2019-11-09 (×2): qty 1

## 2019-11-09 MED ORDER — INSULIN ASPART 100 UNIT/ML ~~LOC~~ SOLN: 0.0000 [IU] | Freq: Every day | SUBCUTANEOUS | Status: DC

## 2019-11-09 MED ORDER — SODIUM CHLORIDE 0.9 % IV SOLN
1.0000 g | INTRAVENOUS | Status: DC
  Administered 2019-11-09 – 2019-11-11 (×3): 1 g via INTRAVENOUS
  Filled 2019-11-09: qty 1
  Filled 2019-11-09 (×2): qty 10

## 2019-11-09 MED ORDER — FUROSEMIDE 10 MG/ML IJ SOLN
40.0000 mg | Freq: Two times a day (BID) | INTRAMUSCULAR | Status: DC
  Administered 2019-11-09: 40 mg via INTRAVENOUS
  Filled 2019-11-09: qty 4

## 2019-11-09 MED ORDER — SODIUM CHLORIDE 0.9 % IV SOLN
510.0000 mg | INTRAVENOUS | Status: DC
  Administered 2019-11-09: 510 mg via INTRAVENOUS
  Filled 2019-11-09: qty 17

## 2019-11-09 MED ORDER — LORATADINE 10 MG PO TABS: 10.0000 mg | ORAL_TABLET | Freq: Two times a day (BID) | ORAL | Status: DC

## 2019-11-09 MED ORDER — PREDNISONE 5 MG PO TABS
7.5000 mg | ORAL_TABLET | Freq: Every day | ORAL | Status: DC
  Administered 2019-11-10 – 2019-11-13 (×4): 7.5 mg via ORAL
  Filled 2019-11-09 (×4): qty 2

## 2019-11-09 MED ORDER — CEFAZOLIN SODIUM-DEXTROSE 1-4 GM/50ML-% IV SOLN: 1.0000 g | Freq: Three times a day (TID) | INTRAVENOUS | Status: DC

## 2019-11-09 MED ORDER — LINAGLIPTIN 5 MG PO TABS
5.0000 mg | ORAL_TABLET | Freq: Every day | ORAL | Status: DC
  Administered 2019-11-10 – 2019-11-13 (×4): 5 mg via ORAL
  Filled 2019-11-09 (×4): qty 1

## 2019-11-09 MED ORDER — CETIRIZINE HCL 10 MG PO TABS
10.0000 mg | ORAL_TABLET | Freq: Every day | ORAL | Status: DC
  Administered 2019-11-10 – 2019-11-13 (×4): 10 mg via ORAL
  Filled 2019-11-09 (×4): qty 1

## 2019-11-09 NOTE — Progress Notes (Signed)
Patient ID: [name], male   DOB: 01/25/1929, 86 y.o.   MRN: [id number] S: No events overnight.  States the feels "normal". O:BP (!) 151/59 (BP Location: Left Arm)   Pulse 61   Temp (!) 97.5 F (36.4 C) (Oral)   Resp 14   Ht 5\' 7"  (1.702 m)   Wt 80.9 kg   SpO2 100%   BMI 27.93 kg/m   Intake/Output Summary (Last 24 hours) at 11/09/2019 1009 Last data filed at 11/09/2019 0444 Gross per 24 hour  Intake 100 ml  Output --  Net 100 ml   Intake/Output: I/O last 3 completed shifts: In: 520 [P.O.:120; IV Piggyback:400] Out: 525 [Urine:525]  Intake/Output this shift:  No intake/output data recorded. Weight change: -1.6 kg Gen: NAD CVS: no rub Resp: poor inspiratory effort, no increased work of breathing Abd: +BS, soft, Nt/nd Ext: 1+ left arm swelling and arm in brace, trace pretibial edema,   Recent Labs  Lab 11/06/19 1126 11/06/19 1420 11/06/19 1833 11/07/19 0241 11/07/19 0245 11/08/19 0306 11/09/19 0304  NA 137  --  139 139 140 138 139  K 6.2* 6.4* 5.4* 5.1 5.1 4.4 4.0  CL 106  --   --  105 103 101 103  CO2 19*  --   --  23  --  25 22  GLUCOSE 190*  --   --  123* 119* 205* 191*  BUN 40*  --   --  36* 37* 45* 48*  CREATININE 2.32*  --   --  2.52* 2.70* 3.37* 3.09*  ALBUMIN  --   --   --   --   --   --  2.3*  CALCIUM 9.0  --   --  9.2  --  8.6* 8.3*  PHOS  --   --   --   --   --   --  6.2*   Liver Function Tests: Recent Labs  Lab 11/09/19 0304  ALBUMIN 2.3*   No results for input(s): LIPASE, AMYLASE in the last 168 hours. No results for input(s): AMMONIA in the last 168 hours. CBC: Recent Labs  Lab 11/06/19 1126 11/06/19 1833 11/07/19 0241 11/07/19 0241 11/07/19 0245 11/08/19 0306 11/09/19 0304  WBC 14.5*  --  11.5*  --   --  12.7* 12.9*  HGB 9.0*   < > 8.7*   < > 9.2* 8.2* 8.5*  HCT 28.9*   < > 28.3*   < > 27.0* 26.5* 27.1*  MCV 79.8*  --  78.6*  --   --  77.5* 78.1*  PLT 214  --  214  --   --  194 220   < > = values in this interval not displayed.    Cardiac Enzymes: No results for input(s): CKTOTAL, CKMB, CKMBINDEX, TROPONINI in the last 168 hours. CBG: Recent Labs  Lab 11/08/19 0604 11/08/19 1116 11/08/19 1619 11/08/19 2146 11/09/19 0635  GLUCAP 178* 185* 231* 181* 155*    Iron Studies:  Recent Labs    11/07/19 0241  IRON 38*  TIBC 295  FERRITIN 60   Studies/Results: ECHOCARDIOGRAM COMPLETE  Result Date: 11/07/2019    ECHOCARDIOGRAM REPORT   Patient Name:   [name] Date of Exam: 11/07/2019 Medical Rec #:  [id number]     Height:       67.0 in Accession #:    [id number]    Weight:       180.1 lb Date of Birth:  1928-08-31  BSA:          1.934 m Patient Age:    5 years      BP:           130/69 mmHg Patient Gender: M             HR:           60 bpm. Exam Location:  Inpatient Procedure: 2D Echo Indications:     CHF-Acute Diastolic 509.32 / I71.24  History:         Patient has prior history of Echocardiogram examinations, most                  recent 05/08/2017. CAD, COPD; Risk Factors:Hypertension,                  Diabetes, Dyslipidemia and Sleep Apnea. Acute renal failure,                  Renal cell carcinoma right kidney, GERD, Asthma.  Sonographer:     Darlina Sicilian RDCS Referring Phys:  [id number] Lequita Halt Diagnosing Phys: Adrian Prows MD IMPRESSIONS  1. Left ventricular ejection fraction, by estimation, is 65 to 70%. Left ventricular ejection fraction by PLAX is 64 %. The left ventricle has normal function. The left ventricle has no regional wall motion abnormalities. There is moderate left ventricular hypertrophy. Left ventricular diastolic parameters are consistent with Grade II diastolic dysfunction (pseudonormalization). Elevated left ventricular end-diastolic pressure.  2. Right ventricular systolic function is normal. The right ventricular size is normal.  3. The mitral valve is degenerative. No evidence of mitral valve regurgitation.  4. The aortic valve is tricuspid. Aortic valve regurgitation is not visualized.  Conclusion(s)/Recommendation(s): Poor echo window. Wall motion with reduced sensitivity. Consider definity if clinically indicated. Presence of mitral apparatus makes diastolic dysfunction evaluation difficult. FINDINGS  Left Ventricle: Left ventricular ejection fraction, by estimation, is 65 to 70%. Left ventricular ejection fraction by PLAX is 64 %. The left ventricle has normal function. The left ventricle has no regional wall motion abnormalities. The left ventricular internal cavity size was normal in size. There is moderate left ventricular hypertrophy. Left ventricular diastolic parameters are consistent with Grade II diastolic dysfunction (pseudonormalization). Elevated left ventricular end-diastolic pressure. Right Ventricle: The right ventricular size is normal. No increase in right ventricular wall thickness. Right ventricular systolic function is normal. Left Atrium: Left atrial size was normal in size. Right Atrium: Right atrial size was normal in size. Pericardium: There is no evidence of pericardial effusion. Mitral Valve: The mitral valve is degenerative in appearance. There is mild calcification of the mitral valve leaflet(s). Mild mitral annular calcification. No evidence of mitral valve regurgitation. Tricuspid Valve: The tricuspid valve is normal in structure. Tricuspid valve regurgitation is not demonstrated. No evidence of tricuspid stenosis. Aortic Valve: The aortic valve is tricuspid. Aortic valve regurgitation is not visualized. Pulmonic Valve: The pulmonic valve was not well visualized. Pulmonic valve regurgitation is not visualized. Aorta: The aortic root is normal in size and structure. Venous: The inferior vena cava was not well visualized. IAS/Shunts: The interatrial septum was not well visualized.  LEFT VENTRICLE PLAX 2D LV EF:         Left            Diastology                ventricular     LV e' lateral:   4.46 cm/s  ejection        LV E/e' lateral: 19.1                 fraction by     LV e' medial:    3.26 cm/s                PLAX is 64      LV E/e' medial:  26.1                %. LVIDd:         3.50 cm LVIDs:         2.30 cm LV PW:         1.30 cm LV IVS:        1.50 cm  RIGHT VENTRICLE TAPSE (M-mode): 1.9 cm LEFT ATRIUM           Index LA Vol (A2C): 54.0 ml 27.92 ml/m LA Vol (A4C): 54.9 ml 28.38 ml/m  AORTIC VALVE LVOT Vmax:   92.00 cm/s LVOT Vmean:  58.900 cm/s LVOT VTI:    0.201 m MITRAL VALVE MV Area (PHT): 2.34 cm     SHUNTS MV Decel Time: 324 msec     Systemic VTI: 0.20 m MV E velocity: 85.10 cm/s MV A velocity: 110.00 cm/s MV E/A ratio:  0.77 Adrian Prows MD Electronically signed by Adrian Prows MD Signature Date/Time: 11/07/2019/9:36:45 PM    Final    VAS [place] UPPER EXTREMITY VENOUS DUPLEX  Result Date: 11/07/2019 UPPER VENOUS STUDY  Indications: Edema, and Pain Comparison Study: No prior studies. Performing Technologist: Darlin Coco  Examination Guidelines: A complete evaluation includes B-mode imaging, spectral Doppler, color Doppler, and power Doppler as needed of all accessible portions of each vessel. Bilateral testing is considered an integral part of a complete examination. Limited examinations for reoccurring indications may be performed as noted.  Right Findings: +----------+------------+---------+-----------+----------+-------+ RIGHT     CompressiblePhasicitySpontaneousPropertiesSummary +----------+------------+---------+-----------+----------+-------+ Subclavian               Yes       Yes                      +----------+------------+---------+-----------+----------+-------+  Left Findings: +----------+------------+---------+-----------+----------+-------+ LEFT      CompressiblePhasicitySpontaneousPropertiesSummary +----------+------------+---------+-----------+----------+-------+ IJV           Full       Yes       Yes                      +----------+------------+---------+-----------+----------+-------+ Subclavian    Full        Yes       Yes                      +----------+------------+---------+-----------+----------+-------+ Axillary      Full       Yes       Yes                      +----------+------------+---------+-----------+----------+-------+ Brachial      Full                                          +----------+------------+---------+-----------+----------+-------+ Radial        Full                                          +----------+------------+---------+-----------+----------+-------+  Ulnar         Full                                          +----------+------------+---------+-----------+----------+-------+ Cephalic      Full                                          +----------+------------+---------+-----------+----------+-------+ Basilic       Full                                          +----------+------------+---------+-----------+----------+-------+  Summary:  Right: No evidence of thrombosis in the subclavian.  Left: No evidence of deep vein thrombosis in the upper extremity. No evidence of superficial vein thrombosis in the upper extremity.  *See table(s) above for measurements and observations.  Diagnosing physician: Servando Snare MD Electronically signed by Servando Snare MD on 11/07/2019 at 8:15:04 PM.    Final    . amLODipine  5 mg Oral Daily  . aspirin EC  81 mg Oral Q supper  . brimonidine  1 drop Left Eye BID  . budesonide (PULMICORT) nebulizer solution  0.25 mg Nebulization BID  . Chlorhexidine Gluconate Cloth  6 each Topical Daily  . cholecalciferol  2,000 Units Oral Q supper  . donepezil  10 mg Oral Q supper  . doxycycline  100 mg Oral Q12H  . escitalopram  5 mg Oral Q supper  . ferrous sulfate  325 mg Oral QPC supper  . finasteride  5 mg Oral Q supper  . heparin injection (subcutaneous)  5,000 Units Subcutaneous Q12H  . insulin aspart  0-9 Units Subcutaneous TID WC  . ipratropium-albuterol  3 mL Nebulization BID  . latanoprost  1 drop Both Eyes  QHS  . loratadine  10 mg Oral Daily  . melatonin  5 mg Oral QHS  . memantine  10 mg Oral BID  . mirabegron ER  50 mg Oral Q supper  . montelukast  10 mg Oral Daily  . multivitamin with minerals  1 tablet Oral Daily  . pantoprazole  40 mg Oral BID  . [START ON 11/16/2019] predniSONE  7.5 mg Oral QAC breakfast  . sodium chloride flush  3 mL Intravenous Q12H    BMET    Component Value Date/Time   NA 139 11/09/2019 0304   K 4.0 11/09/2019 0304   CL 103 11/09/2019 0304   CO2 22 11/09/2019 0304   GLUCOSE 191 (H) 11/09/2019 0304   BUN 48 (H) 11/09/2019 0304   CREATININE 3.09 (H) 11/09/2019 0304   CALCIUM 8.3 (L) 11/09/2019 0304   GFRNONAA 17 (L) 11/09/2019 0304   GFRAA 20 (L) 11/09/2019 0304   CBC    Component Value Date/Time   WBC 12.9 (H) 11/09/2019 0304   RBC 3.47 (L) 11/09/2019 0304   HGB 8.5 (L) 11/09/2019 0304   HCT 27.1 (L) 11/09/2019 0304   PLT 220 11/09/2019 0304   MCV 78.1 (L) 11/09/2019 0304   MCH 24.5 (L) 11/09/2019 0304   MCHC 31.4 11/09/2019 0304   RDW 17.2 (H) 11/09/2019 0304   LYMPHSABS 1.2 05/07/2017 1652   MONOABS 0.8 05/07/2017 1652   EOSABS 1.0 (H)  05/07/2017 1652   BASOSABS 0.0 05/07/2017 1652   Assessment/Plan: 1.  Non-oliguric AKI/CKD stage IIIa- in setting of acute on chronic diastolic CHF and IV diuresis as well as septic olecranon bursitis.  He did receive IV vanco 1 gram on 11/07/19.  Now on rocephin.  Renal US without obstruction and c/w chronic medical renal disease.  Likely due cardiorenal syndrome vs ischemic ATN.   1. Hold IV lasix for now since volume has improved but will eventually need to resume but would recommend IV lasix 40 mg bid no tid as on admission. 2. Continue to follow renal function and UOP 3. Renal dose meds 4. Avoid IV contrast and nephrotoxic agents. 2. Acute on chronic diastolic CHF- now grade II DD on ECHO.  Responding to IV lasix. 3. Septic left olecranon bursitis with cellulitis- +staph aureus on gram stain.  Given one dose  of vanco and now on rocephin.  Ortho following. 4. Hyperkalemia- improved with lokelma and IV lasix 5. Renal mass- right kidney, soft tissue mass concerning for RCC.  Will need Urology evaluation as he may be a candidate for cryoablation but doubt he would tolerate partial nephrectomy. 6. DM- per primary 7. COPD- improved with bronchodilators.  On chronic prednisone 8. OSA on CPAP qhs 9. Anemia of CKD and iron deficiency.  TSAT 13%, ferritin 60, iron 38.  Will dose IV feraheme if ok with ID and likely need to start ESA.  Transfuse prn 10. HTN- stable 11. Dementia- stable and family at bedside. 12. CAD- stable 13. Disposition- pt is DNR.    Donetta Potts, MD [employer] [phone]

## 2019-11-09 NOTE — H&P (View-Only) (Signed)
Orthopaedic Trauma Progress Note  S: Doing okay overall this morning.  Continues to have pain in the left elbow and swelling to the hand.  Denies any fevers, chills, nausea, vomiting.  Son at bedside. Synovial fluid culture grew staph aureus, susceptibilities pending.    O:  Vitals:   11/09/19 0828 11/09/19 0829  BP:    Pulse:    Resp:    Temp:    SpO2: 100% 100%    General: Sitting up in bed, no acute distress  Respiratory: No increased work of breathing. Left upper extremity: Sling and splint in place.  Significant swelling through the arm and hand.  Tender with palpation about the elbow.  Able to wiggle each of his fingers.  Motor and sensory function is intact throughout the median, radial, ulnar nerve distributions.  Extremity warm   Labs:  Results for orders placed or performed during the hospital encounter of 11/06/19 (from the past 24 hour(s))  Glucose, capillary     Status: Abnormal   Collection Time: 11/08/19 11:16 AM  Result Value Ref Range   Glucose-Capillary 185 (H) 70 - 99 mg/dL  Glucose, capillary     Status: Abnormal   Collection Time: 11/08/19  4:19 PM  Result Value Ref Range   Glucose-Capillary 231 (H) 70 - 99 mg/dL  Protein / creatinine ratio, urine     Status: Abnormal   Collection Time: 11/08/19  4:32 PM  Result Value Ref Range   Creatinine, Urine 109.59 mg/dL   Total Protein, Urine 22 mg/dL   Protein Creatinine Ratio 0.20 (H) 0.00 - 0.15 mg/mg[Cre]  Sodium, urine, random     Status: None   Collection Time: 11/08/19  4:32 PM  Result Value Ref Range   Sodium, Ur 71 mmol/L  Creatinine, urine, random     Status: None   Collection Time: 11/08/19  4:32 PM  Result Value Ref Range   Creatinine, Urine 105.62 mg/dL  Urinalysis, Complete w Microscopic     Status: Abnormal   Collection Time: 11/08/19  4:33 PM  Result Value Ref Range   Color, Urine YELLOW YELLOW   APPearance HAZY (A) CLEAR   Specific Gravity, Urine 1.014 1.005 - 1.030   pH 5.0 5.0 - 8.0    Glucose, UA NEGATIVE NEGATIVE mg/dL   Hgb urine dipstick NEGATIVE NEGATIVE   Bilirubin Urine NEGATIVE NEGATIVE   Ketones, ur NEGATIVE NEGATIVE mg/dL   Protein, ur NEGATIVE NEGATIVE mg/dL   Nitrite NEGATIVE NEGATIVE   Leukocytes,Ua LARGE (A) NEGATIVE   RBC / HPF 0-5 0 - 5 RBC/hpf   WBC, UA 11-20 0 - 5 WBC/hpf   Bacteria, UA RARE (A) NONE SEEN   Mucus PRESENT    Hyaline Casts, UA PRESENT   Glucose, capillary     Status: Abnormal   Collection Time: 11/08/19  9:46 PM  Result Value Ref Range   Glucose-Capillary 181 (H) 70 - 99 mg/dL  Brain natriuretic peptide     Status: Abnormal   Collection Time: 11/09/19  3:04 AM  Result Value Ref Range   B Natriuretic Peptide 149.5 (H) 0.0 - 100.0 pg/mL  Renal function panel     Status: Abnormal   Collection Time: 11/09/19  3:04 AM  Result Value Ref Range   Sodium 139 135 - 145 mmol/L   Potassium 4.0 3.5 - 5.1 mmol/L   Chloride 103 98 - 111 mmol/L   CO2 22 22 - 32 mmol/L   Glucose, Bld 191 (H) 70 - 99 mg/dL  BUN 48 (H) 8 - 23 mg/dL   Creatinine, Ser 3.09 (H) 0.61 - 1.24 mg/dL   Calcium 8.3 (L) 8.9 - 10.3 mg/dL   Phosphorus 6.2 (H) 2.5 - 4.6 mg/dL   Albumin 2.3 (L) 3.5 - 5.0 g/dL   GFR calc non Af Amer 17 (L) >60 mL/min   GFR calc Af Amer 20 (L) >60 mL/min   Anion gap 14 5 - 15  CBC     Status: Abnormal   Collection Time: 11/09/19  3:04 AM  Result Value Ref Range   WBC 12.9 (H) 4.0 - 10.5 K/uL   RBC 3.47 (L) 4.22 - 5.81 MIL/uL   Hemoglobin 8.5 (L) 13.0 - 17.0 g/dL   HCT 27.1 (L) 39 - 52 %   MCV 78.1 (L) 80.0 - 100.0 fL   MCH 24.5 (L) 26.0 - 34.0 pg   MCHC 31.4 30.0 - 36.0 g/dL   RDW 17.2 (H) 11.5 - 15.5 %   Platelets 220 150 - 400 K/uL   nRBC 0.2 0.0 - 0.2 %  Glucose, capillary     Status: Abnormal   Collection Time: 11/09/19  6:35 AM  Result Value Ref Range   Glucose-Capillary 155 (H) 70 - 99 mg/dL   Comment 1 Notify RN     Assessment: 86 year old male with left septic olecranon bursitis   Plan: Orthopedic device(s): Splint  and Sling for comfort  Pain management: Continue current regimen VTE prophylaxis: Heparin ID: Rocephin, doxycycline  Dispo: Dr. Fredonia Highland will plan to re-evaluate Sunday morning, and determine how patient is doing clinically, both physiologically as well as with respect to his elbow, and consider surgical debridement under regional or local anesthetic late Sunday morning, depending on how he looks by then.  Plan to keep him n.p.o. after midnight Saturday night  Follow - up plan: To be determined   Brightyn Mozer A. [facility] [phone] (office) [email or web address]

## 2019-11-09 NOTE — Progress Notes (Signed)
PROGRESS NOTE    [name]  WUX:[id number] DOB: 1928-06-15 DOA: 11/06/2019 PCP: Leanna Battles, MD      Brief Narrative:  Mr. [name] is a 86 y.o. M with hx mod dementia, home dwelling, CAD s/p PCI 2005, HTN, COPD not on O2, DM, CKD IIIa baseline 1.2 per our records, OSA on CPAP and possible TIA who presented with few days of progressive confusion and swelling (thought to be legs and arm/face).  Of note, patient had recently been seen by orthopedics for arm swelling, x-rays showed no fracture, thought to have cellulitis.  In the ER, CXR showed edema and small effusion, K 6.2, Cr 2.3, Bicarb 19.  ECG with sinus bradycardia.  Also, lactate >3 and WBC 14K with UA suggesting UTI.  Cardiology and Pulmonology were consulted.  Patient started on IV Lasix for CHF, IV Rocephin for UTI.         Assessment & Plan:   Acute renal failure on CKD stage IIIa Baseline creatinine 1.2.  On admission creatinine 2.3.    UA showed pyuria, no RBCs.  FeNA 1.7%, intrinsic.  Renal imaging noted a 4 cm kidney mass, but no hydronephrosis.  No NSAID exposure, had been off ARB for some time per family.  +vanc exposure on 7/8  This was initially suspected to be congestive nephropathy, so he was started on diuresis.  Renal function progressively worsened over 48 hours with diuresis, despite decreasing Lasix dose, so this was stopped yesterday and he was given gentle fluids yesterday.  Today, arm swelling worse but Cr better.  UOP poor off diuretic  -Hold IVF  -Restart Lasix cautiously tonight (discussed with Dr. Marval Regal) -Consult Neph, appreciate cares  -Avoid ARB -Avoid nephrotoxins -Strict I/Os     Cellulitis of the left arm with olecranon bursitis Swelling developed in the left forearm several days prior to admission.  Olecranon bursa aspirated by orthopedics, MSSA in culture.  Sepsis was ruled out. -Continue ceftriaxone    Acute diastolic CHF Presented with SOB leg swelling, CXR with  interstitial edema and BNP 477.  Admitted and started on IV Lasix  Echo showed normal EF, grade 2 diastolic dysfunction, normal valves.  Even yesterday.  -1.5 on admission. -Resume diuretic cautiously -Hold home potassium supplement      Microcytic anemia Ferritin low normal, TIBC, iron sat and sat ratio all low.  No clear history GI blood loss.  RI low/hypoproliferative. -Feraheme given -Cardiology have held Plavix    Possible UTI Presented with leukocytosis, pyuria. -Covered by Rocephin -Follow urine culture, not previously collected   Renal mass 4.8 x 4.3 cm soft tissue mass noted incidentally in the RIGHT kidney.  Will need contrasted study eventually.  Family are discussing goals of care, they are mulling whether further work up of this is patient's wishes -Obtain MRI abdomen with and without gadolinium when GFR improves  COPD without exacerbation OSA -Continue montelukast -Switch loratadine to Zyrtec given breakthrough itching -Continue duo nebs -Continue Pulmicort -Hold home Trelegy -Continue CPAP at night -Continue home prednisone at chronic 7.5 daily dose    Vascular disease, secondary prevention Hypertension Blood pressure high normal -Hold telmisartan -Continue amlodipine -Hold nebivolol given bradycardia previously, restart when able -Continue low-dose aspirin, hold Plavix   Diabetes A1c 9.4%. Gulcoses elevated here -Restart Januvia -Hold Metformin -Continue sliding scale correction insulin   Dementia -Continue donepezil, memantine, Escitalopram  BPH LUTS -Continue mirabegron and finasteride  GERD -Continue PPI   Acute metabolic encephalopathy due to renal failure superimposed on moderate  baseline dementia Encephalopathy appears to be worsening Delirium precautions:   -Lights and TV off, minimize interruptions at night  -Blinds open and lights on during day  -Glasses/hearing aid with patient  -Frequent reorientation  -PT/OT when  able  -Avoid sedation medications/Beers list medications   Hyperkalemia Acute metabolic acidosis Potassium and acidosis are resolved with diuresis.          Disposition: Status is: Inpatient  Remains inpatient appropriate because:he has persistent encephalopathy, requires ongoing IV antibiotics and IV lasix and close monitoring renal function   Dispo: The patient is from: Home              Anticipated d/c is to: TBD              Anticipated d/c date is: >3 days              Patient currently is not medically stable to d/c.              MDM: The below labs and imaging reports reviewed and summarized above.  Medication management as above.     DVT prophylaxis: heparin injection 5,000 Units Start: 11/06/19 1530  Code Status: DNR Family Communication: Son at the bedside    Consultants:   Cardiology  Pulmonology  Orthopedic  Nephrology  Procedures:   7/7 renal ultrasound --no hydronephrosis  7/7 CT abdomen and pelvis --renal mass noted, no other abnormalities  Antimicrobials:   Ceftriaxone 7/7 >>  Vancomycin 1 g x 1 7/8  Doxycycline x1 7/8   Culture data:   7/8 urine culture pending  7/8 blood culture x2 no growth to date  7/8 olecranon bursa aspiration-MSSA          Subjective: More confused today.  No fever overnight.  No respiratory distress.  No change in pain in the left arm.  No vomiting, diarrhea.   Still severe generalized weakness      Objective: Vitals:   11/09/19 0828 11/09/19 0829 11/09/19 1032 11/09/19 1137  BP:   (!) 146/62 (!) 147/64  Pulse:   78   Resp:   14   Temp:   97.6 F (36.4 C)   TempSrc:   Oral   SpO2: 100% 100% 97%   Weight:      Height:        Intake/Output Summary (Last 24 hours) at 11/09/2019 1339 Last data filed at 11/09/2019 0444 Gross per 24 hour  Intake 100 ml  Output --  Net 100 ml   Filed Weights   11/07/19 0636 11/08/19 0420 11/09/19 0500  Weight: 81.7 kg 82.5 kg 80.9 kg     Examination: General appearance: Elderly adult male, lying in bed, interactive, appears somewhat restless     HEENT: Anicteric, conjunctival pink, lids and lashes normal for age, no nasal deformity, discharge, or epistaxis. Skin: Scattered senile purpura, no suspicious rashes on face, neck, arms, or legs. Cardiac: RRR, puffiness of the left arm, none on the right, minimal pretibial edema. Respiratory: Normal respiratory rate and rhythm, lungs clear without rales or wheezes. Abdomen: Abdomen soft, no guarding, no grimace to palpation, no ascites or distention. MSK: Loss of subcutaneous muscle mass and fat, thenar wasting. Neuro:  speech somewhat dysarthric due to fatigue.  Appears to move both upper extremities with symmetric strength but generalized weakness left arm movement limited by splint Psych: Appears confused, oriented to self, son, but not to situation, and easily distracted.  Affect pleasant.      Data Reviewed: I have  personally reviewed following labs and imaging studies:  CBC: Recent Labs  Lab 11/06/19 1126 11/06/19 1126 11/06/19 1833 11/07/19 0241 11/07/19 0245 11/08/19 0306 11/09/19 0304  WBC 14.5*  --   --  11.5*  --  12.7* 12.9*  HGB 9.0*   < > 8.5* 8.7* 9.2* 8.2* 8.5*  HCT 28.9*   < > 25.0* 28.3* 27.0* 26.5* 27.1*  MCV 79.8*  --   --  78.6*  --  77.5* 78.1*  PLT 214  --   --  214  --  194 220   < > = values in this interval not displayed.   Basic Metabolic Panel: Recent Labs  Lab 11/06/19 1126 11/06/19 1420 11/06/19 1833 11/07/19 0241 11/07/19 0245 11/08/19 0306 11/09/19 0304  NA 137   < > 139 139 140 138 139  K 6.2*   < > 5.4* 5.1 5.1 4.4 4.0  CL 106  --   --  105 103 101 103  CO2 19*  --   --  23  --  25 22  GLUCOSE 190*  --   --  123* 119* 205* 191*  BUN 40*  --   --  36* 37* 45* 48*  CREATININE 2.32*  --   --  2.52* 2.70* 3.37* 3.09*  CALCIUM 9.0  --   --  9.2  --  8.6* 8.3*  PHOS  --   --   --   --   --   --  6.2*   < > = values in  this interval not displayed.   GFR: Estimated Creatinine Clearance: 16.8 mL/min (A) (by C-G formula based on SCr of 3.09 mg/dL (H)). Liver Function Tests: Recent Labs  Lab 11/09/19 0304  ALBUMIN 2.3*   No results for input(s): LIPASE, AMYLASE in the last 168 hours. No results for input(s): AMMONIA in the last 168 hours. Coagulation Profile: No results for input(s): INR, PROTIME in the last 168 hours. Cardiac Enzymes: No results for input(s): CKTOTAL, CKMB, CKMBINDEX, TROPONINI in the last 168 hours. BNP (last 3 results) No results for input(s): PROBNP in the last 8760 hours. HbA1C: Recent Labs    11/07/19 0241  HGBA1C 9.4*   CBG: Recent Labs  Lab 11/08/19 1116 11/08/19 1619 11/08/19 2146 11/09/19 0635 11/09/19 1214  GLUCAP 185* 231* 181* 155* 260*   Lipid Profile: No results for input(s): CHOL, HDL, LDLCALC, TRIG, CHOLHDL, LDLDIRECT in the last 72 hours. Thyroid Function Tests: Recent Labs    11/07/19 0241  TSH 3.370   Anemia Panel: Recent Labs    11/07/19 0241  FERRITIN 60  TIBC 295  IRON 38*  RETICCTPCT 1.7   Urine analysis:    Component Value Date/Time   COLORURINE YELLOW 11/08/2019 1633   APPEARANCEUR HAZY (A) 11/08/2019 1633   LABSPEC 1.014 11/08/2019 1633   PHURINE 5.0 11/08/2019 1633   GLUCOSEU NEGATIVE 11/08/2019 1633   HGBUR NEGATIVE 11/08/2019 1633   BILIRUBINUR NEGATIVE 11/08/2019 1633   KETONESUR NEGATIVE 11/08/2019 1633   PROTEINUR NEGATIVE 11/08/2019 1633   UROBILINOGEN 0.2 09/24/2014 1442   NITRITE NEGATIVE 11/08/2019 1633   LEUKOCYTESUR LARGE (A) 11/08/2019 1633   Sepsis Labs: @LABRCNTIP (procalcitonin:4,lacticacidven:4)  ) Recent Results (from the past 240 hour(s))  SARS Coronavirus 2 by RT PCR (hospital order, performed in [facility] hospital lab) Nasopharyngeal Nasopharyngeal Swab     Status: None   Collection Time: 11/06/19  2:34 PM   Specimen: Nasopharyngeal Swab  Result Value Ref Range Status   SARS Coronavirus  2  NEGATIVE NEGATIVE Final    Comment: (NOTE) SARS-CoV-2 target nucleic acids are NOT DETECTED.  The SARS-CoV-2 RNA is generally detectable in upper and lower respiratory specimens during the acute phase of infection. The lowest concentration of SARS-CoV-2 viral copies this assay can detect is 250 copies / mL. A negative result does not preclude SARS-CoV-2 infection and should not be used as the sole basis for treatment or other patient management decisions.  A negative result may occur with improper specimen collection / handling, submission of specimen other than nasopharyngeal swab, presence of viral mutation(s) within the areas targeted by this assay, and inadequate number of viral copies (<250 copies / mL). A negative result must be combined with clinical observations, patient history, and epidemiological information.  Fact Sheet for Patients:   StrictlyIdeas.no  Fact Sheet for Healthcare Providers: BankingDealers.co.za  This test is not yet approved or  cleared by the [place] FDA and has been authorized for detection and/or diagnosis of SARS-CoV-2 by FDA under an Emergency Use Authorization (EUA).  This EUA will remain in effect (meaning this test can be used) for the duration of the COVID-19 declaration under Section 564(b)(1) of the Act, 21 U.S.C. section 360bbb-3(b)(1), unless the authorization is terminated or revoked sooner.  Performed at [facility], [place] [address], [place], [place] [address]   Body fluid culture     Status: None (Preliminary result)   Collection Time: 11/07/19  1:48 PM   Specimen: Fluid  Result Value Ref Range Status   Specimen Description FLUID SYNOVIAL CYSTS  Final   Special Requests NONE  Final   Gram Stain   Final    ABUNDANT WBC PRESENT,BOTH PMN AND MONONUCLEAR RARE GRAM POSITIVE COCCI CRITICAL RESULT CALLED TO, READ BACK BY AND VERIFIED WITH: L HUYNH RN 11/07/19 1724 JDW    Culture  FEW STAPHYLOCOCCUS AUREUS  Final   Report Status PENDING  Incomplete   Organism ID, Bacteria STAPHYLOCOCCUS AUREUS  Final      Susceptibility   Staphylococcus aureus - MIC*    CIPROFLOXACIN <=0.5 SENSITIVE Sensitive     ERYTHROMYCIN <=0.25 SENSITIVE Sensitive     GENTAMICIN <=0.5 SENSITIVE Sensitive     OXACILLIN <=0.25 SENSITIVE Sensitive     TETRACYCLINE <=1 SENSITIVE Sensitive     VANCOMYCIN <=0.5 SENSITIVE Sensitive     TRIMETH/SULFA <=10 SENSITIVE Sensitive     CLINDAMYCIN <=0.25 SENSITIVE Sensitive     RIFAMPIN <=0.5 SENSITIVE Sensitive     Inducible Clindamycin Value in next row Sensitive      NEGATIVEPerformed at [place] [address], [place], [place] [address]    * FEW STAPHYLOCOCCUS AUREUS  Culture, blood (routine x 2)     Status: None (Preliminary result)   Collection Time: 11/07/19  7:17 PM   Specimen: BLOOD  Result Value Ref Range Status   Specimen Description BLOOD RIGHT ANTECUBITAL  Final   Special Requests   Final    BOTTLES DRAWN AEROBIC AND ANAEROBIC Blood Culture adequate volume   Culture   Final    NO GROWTH < 24 HOURS Performed at [facility], [place] [address], [place], [place] [address]    Report Status PENDING  Incomplete  Culture, blood (routine x 2)     Status: None (Preliminary result)   Collection Time: 11/07/19  7:25 PM   Specimen: BLOOD RIGHT HAND  Result Value Ref Range Status   Specimen Description BLOOD RIGHT HAND  Final   Special Requests  Final    BOTTLES DRAWN AEROBIC ONLY Blood Culture adequate volume   Culture   Final    NO GROWTH < 24 HOURS Performed at [facility], [address] [place], [place] [address]    Report Status PENDING  Incomplete         Radiology Studies: ECHOCARDIOGRAM COMPLETE  Result Date: 11/07/2019    ECHOCARDIOGRAM REPORT   Patient Name:   [name] Date of Exam: 11/07/2019 Medical Rec #:  [id number]     Height:       67.0 in Accession #:    [id number]    Weight:       180.1 lb  Date of Birth:  31-Oct-1928     BSA:          1.934 m Patient Age:    7 years      BP:           130/69 mmHg Patient Gender: M             HR:           60 bpm. Exam Location:  Inpatient Procedure: 2D Echo Indications:     CHF-Acute Diastolic 371.69 / C78.93  History:         Patient has prior history of Echocardiogram examinations, most                  recent 05/08/2017. CAD, COPD; Risk Factors:Hypertension,                  Diabetes, Dyslipidemia and Sleep Apnea. Acute renal failure,                  Renal cell carcinoma right kidney, GERD, Asthma.  Sonographer:     Darlina Sicilian RDCS Referring Phys:  [id number] Lequita Halt Diagnosing Phys: Adrian Prows MD IMPRESSIONS  1. Left ventricular ejection fraction, by estimation, is 65 to 70%. Left ventricular ejection fraction by PLAX is 64 %. The left ventricle has normal function. The left ventricle has no regional wall motion abnormalities. There is moderate left ventricular hypertrophy. Left ventricular diastolic parameters are consistent with Grade II diastolic dysfunction (pseudonormalization). Elevated left ventricular end-diastolic pressure.  2. Right ventricular systolic function is normal. The right ventricular size is normal.  3. The mitral valve is degenerative. No evidence of mitral valve regurgitation.  4. The aortic valve is tricuspid. Aortic valve regurgitation is not visualized. Conclusion(s)/Recommendation(s): Poor echo window. Wall motion with reduced sensitivity. Consider definity if clinically indicated. Presence of mitral apparatus makes diastolic dysfunction evaluation difficult. FINDINGS  Left Ventricle: Left ventricular ejection fraction, by estimation, is 65 to 70%. Left ventricular ejection fraction by PLAX is 64 %. The left ventricle has normal function. The left ventricle has no regional wall motion abnormalities. The left ventricular internal cavity size was normal in size. There is moderate left ventricular hypertrophy. Left ventricular  diastolic parameters are consistent with Grade II diastolic dysfunction (pseudonormalization). Elevated left ventricular end-diastolic pressure. Right Ventricle: The right ventricular size is normal. No increase in right ventricular wall thickness. Right ventricular systolic function is normal. Left Atrium: Left atrial size was normal in size. Right Atrium: Right atrial size was normal in size. Pericardium: There is no evidence of pericardial effusion. Mitral Valve: The mitral valve is degenerative in appearance. There is mild calcification of the mitral valve leaflet(s). Mild mitral annular calcification. No evidence of mitral valve regurgitation. Tricuspid Valve: The tricuspid valve is normal in structure. Tricuspid  valve regurgitation is not demonstrated. No evidence of tricuspid stenosis. Aortic Valve: The aortic valve is tricuspid. Aortic valve regurgitation is not visualized. Pulmonic Valve: The pulmonic valve was not well visualized. Pulmonic valve regurgitation is not visualized. Aorta: The aortic root is normal in size and structure. Venous: The inferior vena cava was not well visualized. IAS/Shunts: The interatrial septum was not well visualized.  LEFT VENTRICLE PLAX 2D LV EF:         Left            Diastology                ventricular     LV e' lateral:   4.46 cm/s                ejection        LV E/e' lateral: 19.1                fraction by     LV e' medial:    3.26 cm/s                PLAX is 64      LV E/e' medial:  26.1                %. LVIDd:         3.50 cm LVIDs:         2.30 cm LV PW:         1.30 cm LV IVS:        1.50 cm  RIGHT VENTRICLE TAPSE (M-mode): 1.9 cm LEFT ATRIUM           Index LA Vol (A2C): 54.0 ml 27.92 ml/m LA Vol (A4C): 54.9 ml 28.38 ml/m  AORTIC VALVE LVOT Vmax:   92.00 cm/s LVOT Vmean:  58.900 cm/s LVOT VTI:    0.201 m MITRAL VALVE MV Area (PHT): 2.34 cm     SHUNTS MV Decel Time: 324 msec     Systemic VTI: 0.20 m MV E velocity: 85.10 cm/s MV A velocity: 110.00 cm/s MV E/A  ratio:  0.77 Adrian Prows MD Electronically signed by Adrian Prows MD Signature Date/Time: 11/07/2019/9:36:45 PM    Final    VAS [place] UPPER EXTREMITY VENOUS DUPLEX  Result Date: 11/07/2019 UPPER VENOUS STUDY  Indications: Edema, and Pain Comparison Study: No prior studies. Performing Technologist: Darlin Coco  Examination Guidelines: A complete evaluation includes B-mode imaging, spectral Doppler, color Doppler, and power Doppler as needed of all accessible portions of each vessel. Bilateral testing is considered an integral part of a complete examination. Limited examinations for reoccurring indications may be performed as noted.  Right Findings: +----------+------------+---------+-----------+----------+-------+ RIGHT     CompressiblePhasicitySpontaneousPropertiesSummary +----------+------------+---------+-----------+----------+-------+ Subclavian               Yes       Yes                      +----------+------------+---------+-----------+----------+-------+  Left Findings: +----------+------------+---------+-----------+----------+-------+ LEFT      CompressiblePhasicitySpontaneousPropertiesSummary +----------+------------+---------+-----------+----------+-------+ IJV           Full       Yes       Yes                      +----------+------------+---------+-----------+----------+-------+ Subclavian    Full       Yes       Yes                      +----------+------------+---------+-----------+----------+-------+  Axillary      Full       Yes       Yes                      +----------+------------+---------+-----------+----------+-------+ Brachial      Full                                          +----------+------------+---------+-----------+----------+-------+ Radial        Full                                          +----------+------------+---------+-----------+----------+-------+ Ulnar         Full                                           +----------+------------+---------+-----------+----------+-------+ Cephalic      Full                                          +----------+------------+---------+-----------+----------+-------+ Basilic       Full                                          +----------+------------+---------+-----------+----------+-------+  Summary:  Right: No evidence of thrombosis in the subclavian.  Left: No evidence of deep vein thrombosis in the upper extremity. No evidence of superficial vein thrombosis in the upper extremity.  *See table(s) above for measurements and observations.  Diagnosing physician: Servando Snare MD Electronically signed by Servando Snare MD on 11/07/2019 at 8:15:04 PM.    Final         Scheduled Meds: . amLODipine  5 mg Oral Daily  . aspirin EC  81 mg Oral Q supper  . brimonidine  1 drop Left Eye BID  . budesonide (PULMICORT) nebulizer solution  0.25 mg Nebulization BID  . cetirizine  10 mg Oral STAT  . [START ON 11/10/2019] cetirizine  10 mg Oral Daily  . Chlorhexidine Gluconate Cloth  6 each Topical Daily  . cholecalciferol  2,000 Units Oral Q supper  . donepezil  10 mg Oral Q supper  . escitalopram  5 mg Oral Q supper  . finasteride  5 mg Oral Q supper  . furosemide  40 mg Intravenous BID  . heparin injection (subcutaneous)  5,000 Units Subcutaneous Q12H  . insulin aspart  0-9 Units Subcutaneous TID WC  . ipratropium-albuterol  3 mL Nebulization BID  . latanoprost  1 drop Both Eyes QHS  . melatonin  5 mg Oral QHS  . memantine  10 mg Oral BID  . mirabegron ER  50 mg Oral Q supper  . montelukast  10 mg Oral Daily  . multivitamin with minerals  1 tablet Oral Daily  . pantoprazole  40 mg Oral BID  . [START ON 11/16/2019] predniSONE  7.5 mg Oral QAC breakfast  . sodium chloride flush  3 mL Intravenous Q12H   Continuous Infusions: . sodium chloride    . cefTRIAXone (  ROCEPHIN)  IV 1 g (11/09/19 1201)  . ferumoxytol       LOS: 3 days    Time spent: 25  minutes    Edwin Dada, MD [facility] 11/09/2019, 1:39 PM     Please page though [place] or Epic secure chat:  For [employer], Adult nurse

## 2019-11-09 NOTE — Progress Notes (Signed)
Orthopaedic Trauma Progress Note  S: Doing okay overall this morning.  Continues to have pain in the left elbow and swelling to the hand.  Denies any fevers, chills, nausea, vomiting.  Son at bedside. Synovial fluid culture grew staph aureus, susceptibilities pending.    O:  Vitals:   11/09/19 0828 11/09/19 0829  BP:    Pulse:    Resp:    Temp:    SpO2: 100% 100%    General: Sitting up in bed, no acute distress  Respiratory: No increased work of breathing. Left upper extremity: Sling and splint in place.  Significant swelling through the arm and hand.  Tender with palpation about the elbow.  Able to wiggle each of his fingers.  Motor and sensory function is intact throughout the median, radial, ulnar nerve distributions.  Extremity warm   Labs:  Results for orders placed or performed during the hospital encounter of 11/06/19 (from the past 24 hour(s))  Glucose, capillary     Status: Abnormal   Collection Time: 11/08/19 11:16 AM  Result Value Ref Range   Glucose-Capillary 185 (H) 70 - 99 mg/dL  Glucose, capillary     Status: Abnormal   Collection Time: 11/08/19  4:19 PM  Result Value Ref Range   Glucose-Capillary 231 (H) 70 - 99 mg/dL  Protein / creatinine ratio, urine     Status: Abnormal   Collection Time: 11/08/19  4:32 PM  Result Value Ref Range   Creatinine, Urine 109.59 mg/dL   Total Protein, Urine 22 mg/dL   Protein Creatinine Ratio 0.20 (H) 0.00 - 0.15 mg/mg[Cre]  Sodium, urine, random     Status: None   Collection Time: 11/08/19  4:32 PM  Result Value Ref Range   Sodium, Ur 71 mmol/L  Creatinine, urine, random     Status: None   Collection Time: 11/08/19  4:32 PM  Result Value Ref Range   Creatinine, Urine 105.62 mg/dL  Urinalysis, Complete w Microscopic     Status: Abnormal   Collection Time: 11/08/19  4:33 PM  Result Value Ref Range   Color, Urine YELLOW YELLOW   APPearance HAZY (A) CLEAR   Specific Gravity, Urine 1.014 1.005 - 1.030   pH 5.0 5.0 - 8.0    Glucose, UA NEGATIVE NEGATIVE mg/dL   Hgb urine dipstick NEGATIVE NEGATIVE   Bilirubin Urine NEGATIVE NEGATIVE   Ketones, ur NEGATIVE NEGATIVE mg/dL   Protein, ur NEGATIVE NEGATIVE mg/dL   Nitrite NEGATIVE NEGATIVE   Leukocytes,Ua LARGE (A) NEGATIVE   RBC / HPF 0-5 0 - 5 RBC/hpf   WBC, UA 11-20 0 - 5 WBC/hpf   Bacteria, UA RARE (A) NONE SEEN   Mucus PRESENT    Hyaline Casts, UA PRESENT   Glucose, capillary     Status: Abnormal   Collection Time: 11/08/19  9:46 PM  Result Value Ref Range   Glucose-Capillary 181 (H) 70 - 99 mg/dL  Brain natriuretic peptide     Status: Abnormal   Collection Time: 11/09/19  3:04 AM  Result Value Ref Range   B Natriuretic Peptide 149.5 (H) 0.0 - 100.0 pg/mL  Renal function panel     Status: Abnormal   Collection Time: 11/09/19  3:04 AM  Result Value Ref Range   Sodium 139 135 - 145 mmol/L   Potassium 4.0 3.5 - 5.1 mmol/L   Chloride 103 98 - 111 mmol/L   CO2 22 22 - 32 mmol/L   Glucose, Bld 191 (H) 70 - 99 mg/dL  BUN 48 (H) 8 - 23 mg/dL   Creatinine, Ser 3.09 (H) 0.61 - 1.24 mg/dL   Calcium 8.3 (L) 8.9 - 10.3 mg/dL   Phosphorus 6.2 (H) 2.5 - 4.6 mg/dL   Albumin 2.3 (L) 3.5 - 5.0 g/dL   GFR calc non Af Amer 17 (L) >60 mL/min   GFR calc Af Amer 20 (L) >60 mL/min   Anion gap 14 5 - 15  CBC     Status: Abnormal   Collection Time: 11/09/19  3:04 AM  Result Value Ref Range   WBC 12.9 (H) 4.0 - 10.5 K/uL   RBC 3.47 (L) 4.22 - 5.81 MIL/uL   Hemoglobin 8.5 (L) 13.0 - 17.0 g/dL   HCT 27.1 (L) 39 - 52 %   MCV 78.1 (L) 80.0 - 100.0 fL   MCH 24.5 (L) 26.0 - 34.0 pg   MCHC 31.4 30.0 - 36.0 g/dL   RDW 17.2 (H) 11.5 - 15.5 %   Platelets 220 150 - 400 K/uL   nRBC 0.2 0.0 - 0.2 %  Glucose, capillary     Status: Abnormal   Collection Time: 11/09/19  6:35 AM  Result Value Ref Range   Glucose-Capillary 155 (H) 70 - 99 mg/dL   Comment 1 Notify RN     Assessment: 86 year old male with left septic olecranon bursitis   Plan: Orthopedic device(s): Splint  and Sling for comfort  Pain management: Continue current regimen VTE prophylaxis: Heparin ID: Rocephin, doxycycline  Dispo: Dr. Fredonia Highland will plan to re-evaluate Sunday morning, and determine how patient is doing clinically, both physiologically as well as with respect to his elbow, and consider surgical debridement under regional or local anesthetic late Sunday morning, depending on how he looks by then.  Plan to keep him n.p.o. after midnight Saturday night  Follow - up plan: To be determined   Beni Turrell A. [facility] [phone] (office) [email or web address]

## 2019-11-10 ENCOUNTER — Encounter (HOSPITAL_COMMUNITY)
Admission: EM | Disposition: A | Discharge status: Skilled Nursing Facility | Payer: Self-pay | Source: Home / Self Care | Attending: Family Medicine

## 2019-11-10 ENCOUNTER — Encounter (HOSPITAL_COMMUNITY): Payer: Self-pay | Admitting: Internal Medicine

## 2019-11-10 ENCOUNTER — Inpatient Hospital Stay (HOSPITAL_COMMUNITY): Payer: Medicare PPO | Admitting: Certified Registered"

## 2019-11-10 HISTORY — PX: I & D EXTREMITY: SHX5045

## 2019-11-10 LAB — RENAL FUNCTION PANEL
Albumin: 2.5 g/dL — ABNORMAL LOW (ref 3.5–5.0)
Anion gap: 12 (ref 5–15)
BUN: 44 mg/dL — ABNORMAL HIGH (ref 8–23)
CO2: 25 mmol/L (ref 22–32)
Calcium: 8.6 mg/dL — ABNORMAL LOW (ref 8.9–10.3)
Chloride: 104 mmol/L (ref 98–111)
Creatinine, Ser: 2.79 mg/dL — ABNORMAL HIGH (ref 0.61–1.24)
GFR calc Af Amer: 22 mL/min — ABNORMAL LOW (ref >60)
GFR calc non Af Amer: 19 mL/min — ABNORMAL LOW (ref >60)
Glucose, Bld: 161 mg/dL — ABNORMAL HIGH (ref 70–99)
Phosphorus: 4.9 mg/dL — ABNORMAL HIGH (ref 2.5–4.6)
Potassium: 3.7 mmol/L (ref 3.5–5.1)
Sodium: 141 mmol/L (ref 135–145)

## 2019-11-10 LAB — GLUCOSE, CAPILLARY
Glucose-Capillary: 144 mg/dL — ABNORMAL HIGH (ref 70–99)
Glucose-Capillary: 123 mg/dL — ABNORMAL HIGH (ref 70–99)
Glucose-Capillary: 123 mg/dL — ABNORMAL HIGH (ref 70–99)
Glucose-Capillary: 132 mg/dL — ABNORMAL HIGH (ref 70–99)
Glucose-Capillary: 126 mg/dL — ABNORMAL HIGH (ref 70–99)
Glucose-Capillary: 118 mg/dL — ABNORMAL HIGH (ref 70–99)

## 2019-11-10 LAB — CBC
HCT: 28 % — ABNORMAL LOW (ref 39.0–52.0)
Hemoglobin: 8.7 g/dL — ABNORMAL LOW (ref 13.0–17.0)
MCH: 24.2 pg — ABNORMAL LOW (ref 26.0–34.0)
MCHC: 31.1 g/dL (ref 30.0–36.0)
MCV: 78 fL — ABNORMAL LOW (ref 80.0–100.0)
Platelets: 217 10*3/uL (ref 150–400)
RBC: 3.59 MIL/uL — ABNORMAL LOW (ref 4.22–5.81)
RDW: 17.3 % — ABNORMAL HIGH (ref 11.5–15.5)
WBC: 12.9 10*3/uL — ABNORMAL HIGH (ref 4.0–10.5)
nRBC: 0 % (ref 0.0–0.2)

## 2019-11-10 LAB — BODY FLUID CULTURE

## 2019-11-10 SURGERY — IRRIGATION AND DEBRIDEMENT EXTREMITY
Anesthesia: Regional | Laterality: Left

## 2019-11-10 MED ORDER — PROPOFOL 500 MG/50ML IV EMUL
INTRAVENOUS | Status: DC | PRN
  Administered 2019-11-10: 25 ug/kg/min via INTRAVENOUS

## 2019-11-10 MED ORDER — CEFAZOLIN SODIUM-DEXTROSE 1-4 GM/50ML-% IV SOLN
INTRAVENOUS | Status: AC
  Filled 2019-11-10: qty 50

## 2019-11-10 MED ORDER — VANCOMYCIN HCL 1000 MG IV SOLR
INTRAVENOUS | Status: DC | PRN
  Administered 2019-11-10: 1000 mg

## 2019-11-10 MED ORDER — CEFAZOLIN SODIUM-DEXTROSE 2-4 GM/100ML-% IV SOLN
2.0000 g | Freq: Once | INTRAVENOUS | Status: AC
  Administered 2019-11-10: 2 g via INTRAVENOUS
  Filled 2019-11-10: qty 100

## 2019-11-10 MED ORDER — ROPIVACAINE HCL 7.5 MG/ML IJ SOLN
INTRAMUSCULAR | Status: DC | PRN
  Administered 2019-11-10: 20 mL via PERINEURAL

## 2019-11-10 MED ORDER — OXYCODONE HCL 5 MG PO TABS
5.0000 mg | ORAL_TABLET | Freq: Once | ORAL | Status: DC | PRN
Start: 2019-11-10 — End: 2019-11-10

## 2019-11-10 MED ORDER — BUPIVACAINE HCL (PF) 0.25 % IJ SOLN
INTRAMUSCULAR | Status: AC
  Filled 2019-11-10: qty 20

## 2019-11-10 MED ORDER — FENTANYL CITRATE (PF) 100 MCG/2ML IJ SOLN: 25.0000 ug | INTRAMUSCULAR | Status: DC | PRN

## 2019-11-10 MED ORDER — ONDANSETRON HCL 4 MG/2ML IJ SOLN: 4.0000 mg | Freq: Once | INTRAMUSCULAR | Status: DC | PRN

## 2019-11-10 MED ORDER — FUROSEMIDE 10 MG/ML IJ SOLN
40.0000 mg | Freq: Every day | INTRAMUSCULAR | Status: DC
  Administered 2019-11-10 – 2019-11-11 (×2): 40 mg via INTRAVENOUS
  Filled 2019-11-10 (×2): qty 4

## 2019-11-10 MED ORDER — ONDANSETRON HCL 4 MG/2ML IJ SOLN
INTRAMUSCULAR | Status: AC
  Filled 2019-11-10: qty 4

## 2019-11-10 MED ORDER — SODIUM CHLORIDE 0.9 % IV SOLN: INTRAVENOUS | Status: DC

## 2019-11-10 MED ORDER — FENTANYL CITRATE (PF) 100 MCG/2ML IJ SOLN: INTRAMUSCULAR | Status: DC | PRN

## 2019-11-10 MED ORDER — PROPOFOL 500 MG/50ML IV EMUL: INTRAVENOUS | Status: DC | PRN

## 2019-11-10 MED ORDER — METOCLOPRAMIDE HCL 5 MG/ML IJ SOLN: 5.0000 mg | Freq: Three times a day (TID) | INTRAMUSCULAR | Status: DC | PRN

## 2019-11-10 MED ORDER — OXYCODONE HCL 5 MG/5ML PO SOLN: 5.0000 mg | Freq: Once | ORAL | Status: DC | PRN

## 2019-11-10 MED ORDER — DOCUSATE SODIUM 100 MG PO CAPS
100.0000 mg | ORAL_CAPSULE | Freq: Two times a day (BID) | ORAL | Status: DC
  Administered 2019-11-10 – 2019-11-13 (×6): 100 mg via ORAL
  Filled 2019-11-10 (×6): qty 1

## 2019-11-10 MED ORDER — CEFAZOLIN SODIUM-DEXTROSE 1-4 GM/50ML-% IV SOLN
INTRAVENOUS | Status: DC | PRN
  Administered 2019-11-10: 1 g via INTRAVENOUS

## 2019-11-10 MED ORDER — LACTATED RINGERS IV SOLN: INTRAVENOUS | Status: DC | PRN

## 2019-11-10 MED ORDER — SODIUM CHLORIDE 0.9 % IR SOLN
Status: DC | PRN
  Administered 2019-11-10: 1000 mL

## 2019-11-10 MED ORDER — FENTANYL CITRATE (PF) 250 MCG/5ML IJ SOLN
INTRAMUSCULAR | Status: AC
  Filled 2019-11-10: qty 5

## 2019-11-10 MED ORDER — ONDANSETRON HCL 4 MG PO TABS: 4.0000 mg | ORAL_TABLET | Freq: Four times a day (QID) | ORAL | Status: DC | PRN

## 2019-11-10 MED ORDER — METOCLOPRAMIDE HCL 5 MG PO TABS: 5.0000 mg | ORAL_TABLET | Freq: Three times a day (TID) | ORAL | Status: DC | PRN

## 2019-11-10 MED ORDER — ONDANSETRON HCL 4 MG/2ML IJ SOLN: 4.0000 mg | Freq: Four times a day (QID) | INTRAMUSCULAR | Status: DC | PRN

## 2019-11-10 MED ORDER — ONDANSETRON HCL 4 MG/2ML IJ SOLN
INTRAMUSCULAR | Status: DC | PRN
Start: 2019-11-10 — End: 2019-11-10
  Administered 2019-11-10: 4 mg via INTRAVENOUS

## 2019-11-10 MED ORDER — FENTANYL CITRATE (PF) 100 MCG/2ML IJ SOLN
INTRAMUSCULAR | Status: DC | PRN
  Administered 2019-11-10: 50 ug via INTRAVENOUS

## 2019-11-10 MED ORDER — VANCOMYCIN HCL 1000 MG IV SOLR
INTRAVENOUS | Status: AC
  Filled 2019-11-10: qty 1000

## 2019-11-10 SURGICAL SUPPLY — 56 items
BANDAGE ESMARK 6X9 LF (GAUZE/BANDAGES/DRESSINGS) IMPLANT
BLADE SURG 10 STRL SS (BLADE) ×2 IMPLANT
BNDG COHESIVE 4X5 TAN STRL (GAUZE/BANDAGES/DRESSINGS) ×2 IMPLANT
BNDG ELASTIC 4X5.8 VLCR STR LF (GAUZE/BANDAGES/DRESSINGS) ×3 IMPLANT
BNDG ELASTIC 6X5.8 VLCR STR LF (GAUZE/BANDAGES/DRESSINGS) ×1 IMPLANT
BNDG ESMARK 4X9 LF (GAUZE/BANDAGES/DRESSINGS) IMPLANT
BNDG ESMARK 6X9 LF (GAUZE/BANDAGES/DRESSINGS)
BNDG GAUZE ELAST 4 BULKY (GAUZE/BANDAGES/DRESSINGS) ×2 IMPLANT
CNTNR URN SCR LID CUP LEK RST (MISCELLANEOUS) IMPLANT
CONT SPEC 4OZ STRL OR WHT (MISCELLANEOUS)
COVER SURGICAL LIGHT HANDLE (MISCELLANEOUS) ×2 IMPLANT
COVER WAND RF STERILE (DRAPES) ×1 IMPLANT
CUFF TOURN SGL LL 12 NO SLV (MISCELLANEOUS) IMPLANT
CUFF TOURN SGL QUICK 34 (TOURNIQUET CUFF)
CUFF TRNQT CYL 34X4.125X (TOURNIQUET CUFF) IMPLANT
DRAPE SURG 17X23 STRL (DRAPES) IMPLANT
DRAPE U-SHAPE 47X51 STRL (DRAPES) IMPLANT
DRSG EMULSION OIL 3X3 NADH (GAUZE/BANDAGES/DRESSINGS) ×1 IMPLANT
DRSG PAD ABDOMINAL 8X10 ST (GAUZE/BANDAGES/DRESSINGS) ×2 IMPLANT
DURAPREP 26ML APPLICATOR (WOUND CARE) ×2 IMPLANT
ELECT REM PT RETURN 9FT ADLT (ELECTROSURGICAL)
ELECTRODE REM PT RTRN 9FT ADLT (ELECTROSURGICAL) IMPLANT
EVACUATOR 1/8 PVC DRAIN (DRAIN) IMPLANT
FACESHIELD WRAPAROUND (MASK) ×2 IMPLANT
FACESHIELD WRAPAROUND OR TEAM (MASK) ×1 IMPLANT
GAUZE SPONGE 4X4 12PLY STRL (GAUZE/BANDAGES/DRESSINGS) ×2 IMPLANT
GAUZE XEROFORM 1X8 LF (GAUZE/BANDAGES/DRESSINGS) ×1 IMPLANT
GLOVE BIO SURGEON STRL SZ7.5 (GLOVE) ×4 IMPLANT
GLOVE BIOGEL PI IND STRL 8 (GLOVE) ×2 IMPLANT
GLOVE BIOGEL PI INDICATOR 8 (GLOVE) ×2
GLOVE SURG SS PI 6.5 STRL IVOR (GLOVE) ×2 IMPLANT
GLOVE SURG SS PI 7.5 STRL IVOR (GLOVE) ×2 IMPLANT
GOWN STRL REUS W/ TWL LRG LVL3 (GOWN DISPOSABLE) ×3 IMPLANT
GOWN STRL REUS W/TWL LRG LVL3 (GOWN DISPOSABLE) ×6
HANDPIECE INTERPULSE COAX TIP (DISPOSABLE)
KIT BASIN OR (CUSTOM PROCEDURE TRAY) ×2 IMPLANT
KIT TURNOVER KIT B (KITS) ×2 IMPLANT
MANIFOLD NEPTUNE II (INSTRUMENTS) ×2 IMPLANT
NDL HYPO 25GX1X1/2 BEV (NEEDLE) IMPLANT
NEEDLE HYPO 25GX1X1/2 BEV (NEEDLE) IMPLANT
NS IRRIG 1000ML POUR BTL (IV SOLUTION) ×2 IMPLANT
PACK ORTHO EXTREMITY (CUSTOM PROCEDURE TRAY) ×2 IMPLANT
PAD ARMBOARD 7.5X6 YLW CONV (MISCELLANEOUS) ×4 IMPLANT
SET CYSTO W/LG BORE CLAMP LF (SET/KITS/TRAYS/PACK) IMPLANT
SET HNDPC FAN SPRY TIP SCT (DISPOSABLE) IMPLANT
SPONGE LAP 18X18 RF (DISPOSABLE) IMPLANT
STOCKINETTE IMPERVIOUS 9X36 MD (GAUZE/BANDAGES/DRESSINGS) ×2 IMPLANT
SUT ETHILON 3 0 PS 1 (SUTURE) IMPLANT
SUT PDS AB 2-0 CT1 27 (SUTURE) IMPLANT
SWAB CULTURE ESWAB REG 1ML (MISCELLANEOUS) IMPLANT
SYR CONTROL 10ML LL (SYRINGE) IMPLANT
TOWEL GREEN STERILE (TOWEL DISPOSABLE) ×2 IMPLANT
TOWEL GREEN STERILE FF (TOWEL DISPOSABLE) ×2 IMPLANT
TUBE CONNECTING 12X1/4 (SUCTIONS) ×2 IMPLANT
UNDERPAD 30X36 HEAVY ABSORB (UNDERPADS AND DIAPERS) ×2 IMPLANT
YANKAUER SUCT BULB TIP NO VENT (SUCTIONS) ×2 IMPLANT

## 2019-11-10 NOTE — Anesthesia Procedure Notes (Signed)
Anesthesia Regional Block: Supraclavicular block   Pre-Anesthetic Checklist: ,, timeout performed, Correct Patient, Correct Site, Correct Laterality, Correct Procedure, Correct Position, site marked, Risks and benefits discussed,  Surgical consent,  Pre-op evaluation,  At surgeon's request and post-op pain management  Laterality: Left  Prep: chloraprep       Needles:  Injection technique: Single-shot  Needle Type: Echogenic Needle     Needle Length: 5cm  Needle Gauge: 21     Additional Needles:   Narrative:  Start time: 11/10/2019 11:12 AM End time: 11/10/2019 11:16 AM Injection made incrementally with aspirations every 5 mL.  Performed by: Personally  Anesthesiologist: Audry Pili, MD  Additional Notes: No pain on injection. No increased resistance to injection. Injection made in 5cc increments. Good needle visualization. Patient tolerated the procedure well.

## 2019-11-10 NOTE — Transfer of Care (Signed)
Immediate Anesthesia Transfer of Care Note  Patient: [name]  Procedure(s) Performed: IRRIGATION AND DEBRIDEMENT EXTREMITY, left olecranon bursa (Left )  Patient Location: PACU  Anesthesia Type:MAC combined with regional for post-op pain  Level of Consciousness: drowsy  Airway & Oxygen Therapy: Patient Spontanous Breathing and Patient connected to nasal cannula oxygen  Post-op Assessment: Report given to RN and Post -op Vital signs reviewed and stable  Post vital signs: Reviewed and stable  Last Vitals:  Vitals Value Taken Time  BP 112/55 11/10/19 1227  Temp 36.3 C 11/10/19 1225  Pulse 54 11/10/19 1230  Resp 14 11/10/19 1230  SpO2 91 % 11/10/19 1230  Vitals shown include unvalidated device data.  Last Pain:  Vitals:   11/10/19 1225  TempSrc:   PainSc: 0-No pain         Complications: No complications documented.

## 2019-11-10 NOTE — Progress Notes (Signed)
Patient ID: [name], male   DOB: [birth year], 86 y.o.   MRN: [id number] S: No events overnight.  For surgery today per ortho. O:BP 131/67 (BP Location: Right Arm)   Pulse 68   Temp (!) 97.4 F (36.3 C) (Oral)   Resp 14   Ht 5\' 7"  (1.702 m)   Wt 80.4 kg   SpO2 100%   BMI 27.76 kg/m   Intake/Output Summary (Last 24 hours) at 11/10/2019 1139 Last data filed at 11/10/2019 0645 Gross per 24 hour  Intake 417 ml  Output 885 ml  Net -468 ml   Intake/Output: I/O last 3 completed shifts: In: 629 [P.O.:320; IV Piggyback:317] Out: 885 [Urine:885]  Intake/Output this shift:  No intake/output data recorded. Weight change: -0.5 kg Gen: NAD CVS: no rub Resp: scattered rhonchi and exp wheezes bilaterally Abd: benign Ext: trace pretibial edema  Recent Labs  Lab 11/06/19 1126 11/06/19 1126 11/06/19 1420 11/06/19 1833 11/07/19 0241 11/07/19 0245 11/08/19 0306 11/09/19 0304 11/10/19 0340  NA 137  --   --  139 139 140 138 139 141  K 6.2*   < > 6.4* 5.4* 5.1 5.1 4.4 4.0 3.7  CL 106  --   --   --  105 103 101 103 104  CO2 19*  --   --   --  23  --  25 22 25   GLUCOSE 190*  --   --   --  123* 119* 205* 191* 161*  BUN 40*  --   --   --  36* 37* 45* 48* 44*  CREATININE 2.32*  --   --   --  2.52* 2.70* 3.37* 3.09* 2.79*  ALBUMIN  --   --   --   --   --   --   --  2.3* 2.5*  CALCIUM 9.0  --   --   --  9.2  --  8.6* 8.3* 8.6*  PHOS  --   --   --   --   --   --   --  6.2* 4.9*   < > = values in this interval not displayed.   Liver Function Tests: Recent Labs  Lab 11/09/19 0304 11/10/19 0340  ALBUMIN 2.3* 2.5*   No results for input(s): LIPASE, AMYLASE in the last 168 hours. No results for input(s): AMMONIA in the last 168 hours. CBC: Recent Labs  Lab 11/06/19 1126 11/06/19 1833 11/07/19 0241 11/07/19 0245 11/08/19 0306 11/09/19 0304 11/10/19 0340  WBC 14.5*  --  11.5*   < > 12.7* 12.9* 12.9*  HGB 9.0*   < > 8.7*   < > 8.2* 8.5* 8.7*  HCT 28.9*   < > 28.3*   < > 26.5*  27.1* 28.0*  MCV 79.8*  --  78.6*  --  77.5* 78.1* 78.0*  PLT 214  --  214   < > 194 220 217   < > = values in this interval not displayed.   Cardiac Enzymes: No results for input(s): CKTOTAL, CKMB, CKMBINDEX, TROPONINI in the last 168 hours. CBG: Recent Labs  Lab 11/09/19 1214 11/09/19 1725 11/09/19 2120 11/10/19 0642 11/10/19 1017  GLUCAP 260* 101* 152* 144* 123*    Iron Studies: No results for input(s): IRON, TIBC, TRANSFERRIN, FERRITIN in the last 72 hours. Studies/Results: No results found. Doug Sou Hold] amLODipine  5 mg Oral Daily  . [MAR Hold] aspirin EC  81 mg Oral Q supper  . [MAR Hold] brimonidine  1 drop  Left Eye BID  . [MAR Hold] budesonide (PULMICORT) nebulizer solution  0.25 mg Nebulization BID  . [MAR Hold] cetirizine  10 mg Oral Daily  . [MAR Hold] Chlorhexidine Gluconate Cloth  6 each Topical Daily  . [MAR Hold] cholecalciferol  2,000 Units Oral Q supper  . [MAR Hold] donepezil  10 mg Oral Q supper  . [MAR Hold] escitalopram  5 mg Oral Q supper  . [MAR Hold] finasteride  5 mg Oral Q supper  . [MAR Hold] furosemide  40 mg Intravenous Daily  . [MAR Hold] heparin injection (subcutaneous)  5,000 Units Subcutaneous Q12H  . [MAR Hold] insulin aspart  0-15 Units Subcutaneous TID WC  . [MAR Hold] insulin aspart  0-5 Units Subcutaneous QHS  . [MAR Hold] ipratropium-albuterol  3 mL Nebulization BID  . [MAR Hold] latanoprost  1 drop Both Eyes QHS  . [MAR Hold] linagliptin  5 mg Oral Daily  . [MAR Hold] melatonin  5 mg Oral QHS  . [MAR Hold] memantine  10 mg Oral BID  . [MAR Hold] mirabegron ER  50 mg Oral Q supper  . [MAR Hold] montelukast  10 mg Oral Daily  . [MAR Hold] multivitamin with minerals  1 tablet Oral Daily  . [MAR Hold] pantoprazole  40 mg Oral BID  . [MAR Hold] predniSONE  7.5 mg Oral QAC breakfast  . [MAR Hold] sodium chloride flush  3 mL Intravenous Q12H    BMET    Component Value Date/Time   NA 141 11/10/2019 0340   K 3.7 11/10/2019 0340   CL  104 11/10/2019 0340   CO2 25 11/10/2019 0340   GLUCOSE 161 (H) 11/10/2019 0340   BUN 44 (H) 11/10/2019 0340   CREATININE 2.79 (H) 11/10/2019 0340   CALCIUM 8.6 (L) 11/10/2019 0340   GFRNONAA 19 (L) 11/10/2019 0340   GFRAA 22 (L) 11/10/2019 0340   CBC    Component Value Date/Time   WBC 12.9 (H) 11/10/2019 0340   RBC 3.59 (L) 11/10/2019 0340   HGB 8.7 (L) 11/10/2019 0340   HCT 28.0 (L) 11/10/2019 0340   PLT 217 11/10/2019 0340   MCV 78.0 (L) 11/10/2019 0340   MCH 24.2 (L) 11/10/2019 0340   MCHC 31.1 11/10/2019 0340   RDW 17.3 (H) 11/10/2019 0340   LYMPHSABS 1.2 05/07/2017 1652   MONOABS 0.8 05/07/2017 1652   EOSABS 1.0 (H) 05/07/2017 1652   BASOSABS 0.0 05/07/2017 1652    Assessment/Plan: 1. Non-oliguric AKI/CKD stage IIIa- in setting of acute on chronic diastolic CHF and IV diuresis as well as septic olecranon bursitis. He did receive IV vanco 1 gram on 11/07/19. Now on rocephin. Renal US without obstruction and c/w chronic medical renal disease. Likely due cardiorenal syndrome vs ischemic ATN.  1. Continue IV lasix 40 mg daily for now. 2. Continue to follow renal function and UOP 3. Renal dose meds 4. Avoid IV contrast and nephrotoxic agents. 5. Scr improving after holding and then decreasing lasix frequency. 2. Acute on chronic diastolic CHF- now grade II DD on ECHO. Responding to IV lasix. 3. Septic left olecranon bursitis with cellulitis- +staph aureus on gram stain. Given one dose of vanco and now on rocephin. Ortho following and for surgery today. 4. Hyperkalemia- improved with lokelma and IV lasix 5. Renal mass- right kidney, soft tissue mass concerning for RCC. Will consult Urology to help guide family regarding mass.  If this is RCC, they may want to transition to more comfort care but he may also  be a candidate for cryoablation. 6. DM- per primary 7. COPD- improved with bronchodilators. On chronic prednisone 8. OSA on CPAP qhs 9. Anemia of CKD and iron  deficiency. TSAT 13%, ferritin 60, iron 38. Will dose IV feraheme if ok with ID and likely need to start ESA. Transfuse prn 10. HTN- stable 11. Dementia- stable and family at bedside. 12. CAD- stable 13. Disposition- pt is DNR.  Donetta Potts, MD [employer] [phone]

## 2019-11-10 NOTE — Progress Notes (Signed)
Pt's family requested to try again later for nebs.

## 2019-11-10 NOTE — Op Note (Signed)
11/10/2019  12:52 PM  PATIENT:  [name]    PRE-OPERATIVE DIAGNOSIS:  infected left olecranon bursa  POST-OPERATIVE DIAGNOSIS:  Same  PROCEDURE:  IRRIGATION AND DEBRIDEMENT EXTREMITY, left olecranon bursa  SURGEON:  Renette Butters, MD  ASSISTANT: none  ANESTHESIA:   regional  PREOPERATIVE INDICATIONS:  [name] is a  86 y.o. male with a diagnosis of infected left olecranon bursa who failed conservative measures and elected for surgical management.    The risks benefits and alternatives were discussed with the patient preoperatively including but not limited to the risks of infection, bleeding, nerve injury, cardiopulmonary complications, the need for revision surgery, among others, and the patient was willing to proceed.  OPERATIVE IMPLANTS: none  OPERATIVE FINDINGS: purulent fluid  BLOOD LOSS: min  COMPLICATIONS: nnoe  TOURNIQUET TIME: nnone  OPERATIVE PROCEDURE:  Patient was identified in the preoperative holding area and site was marked by me He was transported to the operating theater and placed on the table in supine position taking care to pad all bony prominences. After a preincinduction time out anesthesia was induced. The left upper extremity was prepped and draped in normal sterile fashion and a pre-incision timeout was performed. He received ancef for preoperative antibiotics.   I made a longitudinal incision over his palpable fluid collection at his olecranon bursa.  Purulent fluid was expressed.  I probed to confirm no additional locations of fluid collection.  I then used a Cobb and a hemostat to perform an excisional debridement of fascia.  I then performed an olecranon bursectomy.  I then irrigated with multiple liters of saline  I then placed 500 mg of Vanco powder.  I closed this loosely and placed a sterile dressing he tolerated this well was taken to the PACU in stable condition  POST OPERATIVE PLAN: Continue antibiotics and DVT  prophylaxis per primary team

## 2019-11-10 NOTE — Consult Note (Signed)
Reason for Consult:Right Renal Neoplasm, Stage 4 Renal Insufficiency, Lower Urinary Tract Symptoms  Referring Physician: Donato Heinz MD  [name] is an 86 y.o. male.   HPI:   1 - Right Renal Neoplasm - 5cm Rt mid lateral and inferior mass noted on renal US, then non-con CT 10/2019 during eval acute on chronic renal failure. Mass is amazingly isodense and isoenhancing to kidney. No liver or chest lesions, No adenopathy. In restrospect only appears likely 2.5cm in 2011 suggesting very slow growth kinetics.  2 - Stage 4 Renal Insufficiency - Cr 2-3 with GFR upper teens-20s. Imaging 7/24 w/o stoens or hydro. DM with A1C 8-9.  3 - Lower Urinary Tract Symptoms - Longh/o obsructive and irritative voiding. S/p Urolift 2018 by [facility]. Prior UDS with obstrution. Most recent PVR 2019 32mL (normal). On finasteride, vesicare previously.   PMH sig for CAD/Stent/CHF, Frailty, DM2. His PCP is Elliot Cousin MD. His Son Einar Grad is a respected internal medicine MD in town.  Today "[name]" is seen in consultation for above. He is admitted for CHF exacerbation.   Past Medical History:  Diagnosis Date  . Anemia   . Asthma   . Benign prostatic hypertrophy   . Bilateral sensorineural hearing loss   . CAD (coronary artery disease) 07/2003   a.  3.0 x 28 mm Taxus DES in the mid RCA in 2005  . COPD (chronic obstructive pulmonary disease) ([place])   . Diverticulosis   . DJD (degenerative joint disease)   . DM (diabetes mellitus) ([place])   . Dyslipidemia   . GERD (gastroesophageal reflux disease)   . Hemorrhoids   . Hiatal hernia   . Hypertension   . OSA (obstructive sleep apnea)    cpap use  . Osteoarthritis   . Overactive bladder   . Rhinitis   . Seasonal allergies   . Shingles   . Stroke ([place])   . TIA (transient ischemic attack) 12/2006   Dr. Leonie Man    Past Surgical History:  Procedure Laterality Date  . BACK SURGERY     lower back  . Cataract surgery     1 eye done.  . CORONARY  ANGIOPLASTY WITH STENT PLACEMENT  07/2003   Est. EF of 65% with stent to the RCA, by Dr. Acie Fredrickson  . CYSTOSCOPY WITH INSERTION OF UROLIFT N/A 02/25/2016   Procedure: CYSTOSCOPY WITH INSERTION OF UROLIFT x6;  Surgeon: Cleon Gustin, MD;  Location: [facility];  Service: Urology;  Laterality: N/A;  1 HOUR    . INNER EAR SURGERY    . JOINT REPLACEMENT Right   . L5 laminectomy  2004   Dr. Sherwood Gambler  . right total knee  2006   Dr. Maureen Ralphs    Family History  Problem Relation Age of Onset  . Lung disease Father   . Asthma Father   . Sudden death Mother        unknown causes  . Lung disease Brother   . Asthma Brother   . Sudden death Brother 53       unknown causes  . Sudden death Sister        unknown causes  . Sudden death Sister        unknown causes    Social History:  reports that he quit smoking about 31 years ago. His smoking use included cigarettes. He has a 43.00 pack-year smoking history. He has never used smokeless tobacco. He reports current alcohol use. He reports that he does not use drugs.  Allergies:  Allergies  Allergen Reactions  . Ciprofloxacin Hcl Other (See Comments)    Unknown reaction  . Mirabegron Other (See Comments)    FATIGUE  . Penicillins     Has patient had a PCN reaction causing immediate rash, facial/tongue/throat swelling, SOB or lightheadedness with hypotension: Unknown Has patient had a PCN reaction causing severe rash involving mucus membranes or skin necrosis: Unknown Has patient had a PCN reaction that required hospitalization Unknown Has patient had a PCN reaction occurring within the last 10 years: Unknown If all of the above answers are "NO", then may proceed with Cephalosporin use.   . Sulfa Antibiotics Hives and Rash  . Sulfamethoxazole Rash  . Sulfonamide Derivatives Rash    Medications: I have reviewed the patient's current medications.  Results for orders placed or performed during the hospital encounter of 11/06/19 (from the past  48 hour(s))  Glucose, capillary     Status: Abnormal   Collection Time: 11/08/19  4:19 PM  Result Value Ref Range   Glucose-Capillary 231 (H) 70 - 99 mg/dL    Comment: Glucose reference range applies only to samples taken after fasting for at least 8 hours.  Protein / creatinine ratio, urine     Status: Abnormal   Collection Time: 11/08/19  4:32 PM  Result Value Ref Range   Creatinine, Urine 109.59 mg/dL   Total Protein, Urine 22 mg/dL    Comment: NO NORMAL RANGE ESTABLISHED FOR THIS TEST   Protein Creatinine Ratio 0.20 (H) 0.00 - 0.15 mg/mg[Cre]    Comment: Performed at [facility] [address], [place], [place] [address]  Sodium, urine, random     Status: None   Collection Time: 11/08/19  4:32 PM  Result Value Ref Range   Sodium, Ur 71 mmol/L    Comment: Performed at [facility] [address] [place], [place] [address]  Creatinine, urine, random     Status: None   Collection Time: 11/08/19  4:32 PM  Result Value Ref Range   Creatinine, Urine 105.62 mg/dL    Comment: Performed at [facility] [address], [place], [place] [address]  Urinalysis, Complete w Microscopic     Status: Abnormal   Collection Time: 11/08/19  4:33 PM  Result Value Ref Range   Color, Urine YELLOW YELLOW   APPearance HAZY (A) CLEAR   Specific Gravity, Urine 1.014 1.005 - 1.030   pH 5.0 5.0 - 8.0   Glucose, UA NEGATIVE NEGATIVE mg/dL   Hgb urine dipstick NEGATIVE NEGATIVE   Bilirubin Urine NEGATIVE NEGATIVE   Ketones, ur NEGATIVE NEGATIVE mg/dL   Protein, ur NEGATIVE NEGATIVE mg/dL   Nitrite NEGATIVE NEGATIVE   Leukocytes,Ua LARGE (A) NEGATIVE   RBC / HPF 0-5 0 - 5 RBC/hpf   WBC, UA 11-20 0 - 5 WBC/hpf   Bacteria, UA RARE (A) NONE SEEN   Mucus PRESENT    Hyaline Casts, UA PRESENT     Comment: Performed at [facility] [address] [place], [place] [address]  Glucose, capillary     Status: Abnormal   Collection Time: 11/08/19  9:46 PM  Result Value Ref Range    Glucose-Capillary 181 (H) 70 - 99 mg/dL    Comment: Glucose reference range applies only to samples taken after fasting for at least 8 hours.  Brain natriuretic peptide     Status: Abnormal   Collection Time: 11/09/19  3:04 AM  Result Value Ref Range  B Natriuretic Peptide 149.5 (H) 0.0 - 100.0 pg/mL    Comment: Performed at [facility], [place] [address], [place], [place] [address]  Renal function panel     Status: Abnormal   Collection Time: 11/09/19  3:04 AM  Result Value Ref Range   Sodium 139 135 - 145 mmol/L   Potassium 4.0 3.5 - 5.1 mmol/L   Chloride 103 98 - 111 mmol/L   CO2 22 22 - 32 mmol/L   Glucose, Bld 191 (H) 70 - 99 mg/dL    Comment: Glucose reference range applies only to samples taken after fasting for at least 8 hours.   BUN 48 (H) 8 - 23 mg/dL   Creatinine, Ser 3.09 (H) 0.61 - 1.24 mg/dL   Calcium 8.3 (L) 8.9 - 10.3 mg/dL   Phosphorus 6.2 (H) 2.5 - 4.6 mg/dL   Albumin 2.3 (L) 3.5 - 5.0 g/dL   GFR calc non Af Amer 17 (L) >60 mL/min   GFR calc Af Amer 20 (L) >60 mL/min   Anion gap 14 5 - 15    Comment: Performed at [facility] [address] [place], [place] [address]  CBC     Status: Abnormal   Collection Time: 11/09/19  3:04 AM  Result Value Ref Range   WBC 12.9 (H) 4.0 - 10.5 K/uL   RBC 3.47 (L) 4.22 - 5.81 MIL/uL   Hemoglobin 8.5 (L) 13.0 - 17.0 g/dL   HCT 27.1 (L) 39 - 52 %   MCV 78.1 (L) 80.0 - 100.0 fL   MCH 24.5 (L) 26.0 - 34.0 pg   MCHC 31.4 30.0 - 36.0 g/dL   RDW 17.2 (H) 11.5 - 15.5 %   Platelets 220 150 - 400 K/uL   nRBC 0.2 0.0 - 0.2 %    Comment: Performed at [facility], [place] [address], [place], [place] [address]  Glucose, capillary     Status: Abnormal   Collection Time: 11/09/19  6:35 AM  Result Value Ref Range   Glucose-Capillary 155 (H) 70 - 99 mg/dL    Comment: Glucose reference range applies only to samples taken after fasting for at least 8 hours.   Comment 1 Notify RN   Glucose, capillary     Status: Abnormal    Collection Time: 11/09/19 12:14 PM  Result Value Ref Range   Glucose-Capillary 260 (H) 70 - 99 mg/dL    Comment: Glucose reference range applies only to samples taken after fasting for at least 8 hours.  Glucose, capillary     Status: Abnormal   Collection Time: 11/09/19  5:25 PM  Result Value Ref Range   Glucose-Capillary 101 (H) 70 - 99 mg/dL    Comment: Glucose reference range applies only to samples taken after fasting for at least 8 hours.  Glucose, capillary     Status: Abnormal   Collection Time: 11/09/19  9:20 PM  Result Value Ref Range   Glucose-Capillary 152 (H) 70 - 99 mg/dL    Comment: Glucose reference range applies only to samples taken after fasting for at least 8 hours.  Renal function panel     Status: Abnormal   Collection Time: 11/10/19  3:40 AM  Result Value Ref Range   Sodium 141 135 - 145 mmol/L   Potassium 3.7 3.5 - 5.1 mmol/L   Chloride 104 98 - 111 mmol/L   CO2 25 22 - 32 mmol/L   Glucose, Bld 161 (H) 70 - 99 mg/dL  Comment: Glucose reference range applies only to samples taken after fasting for at least 8 hours.   BUN 44 (H) 8 - 23 mg/dL   Creatinine, Ser 2.79 (H) 0.61 - 1.24 mg/dL   Calcium 8.6 (L) 8.9 - 10.3 mg/dL   Phosphorus 4.9 (H) 2.5 - 4.6 mg/dL   Albumin 2.5 (L) 3.5 - 5.0 g/dL   GFR calc non Af Amer 19 (L) >60 mL/min   GFR calc Af Amer 22 (L) >60 mL/min   Anion gap 12 5 - 15    Comment: Performed at [facility] [address], [place], [place] [address]  CBC     Status: Abnormal   Collection Time: 11/10/19  3:40 AM  Result Value Ref Range   WBC 12.9 (H) 4.0 - 10.5 K/uL   RBC 3.59 (L) 4.22 - 5.81 MIL/uL   Hemoglobin 8.7 (L) 13.0 - 17.0 g/dL   HCT 28.0 (L) 39 - 52 %   MCV 78.0 (L) 80.0 - 100.0 fL   MCH 24.2 (L) 26.0 - 34.0 pg   MCHC 31.1 30.0 - 36.0 g/dL   RDW 17.3 (H) 11.5 - 15.5 %   Platelets 217 150 - 400 K/uL   nRBC 0.0 0.0 - 0.2 %    Comment: Performed at [facility], [place] [address], [place], [place] [address]   Glucose, capillary     Status: Abnormal   Collection Time: 11/10/19  6:42 AM  Result Value Ref Range   Glucose-Capillary 144 (H) 70 - 99 mg/dL    Comment: Glucose reference range applies only to samples taken after fasting for at least 8 hours.  Glucose, capillary     Status: Abnormal   Collection Time: 11/10/19 10:17 AM  Result Value Ref Range   Glucose-Capillary 123 (H) 70 - 99 mg/dL    Comment: Glucose reference range applies only to samples taken after fasting for at least 8 hours.  Glucose, capillary     Status: Abnormal   Collection Time: 11/10/19 12:26 PM  Result Value Ref Range   Glucose-Capillary 123 (H) 70 - 99 mg/dL    Comment: Glucose reference range applies only to samples taken after fasting for at least 8 hours.    No results found.  Review of Systems  Unable to perform ROS: Other  Constitutional:       Pt very somnolent after ortho surgery today. Wife provides all info.    Blood pressure (!) 130/59, pulse (!) 54, temperature (!) 97.3 F (36.3 C), resp. rate 11, height 5\' 7"  (1.702 m), weight 80.4 kg, SpO2 91 %. Physical Exam Constitutional:      Comments: Somnolent after recent surgeyr, but arousable.   HENT:     Head: Normocephalic.  Eyes:     Pupils: Pupils are equal, round, and reactive to light.  Cardiovascular:     Rate and Rhythm: Normal rate.     Comments: HR low 60s by bedside monitor.  Pulmonary:     Effort: Pulmonary effort is normal.     Comments: Non-labored.  Abdominal:     Palpations: Abdomen is soft.  Genitourinary:    Comments: NO CVAT. Foley in place with non-foul urine.  Skin:    General: Skin is warm.  Neurological:     General: No focal deficit present.     Assessment/Plan:  1 - Right Renal Neoplasm - appears very slow growing, non-metastatic neoplasm that I feel very UNlikely to affect his life expectancy. Treating the mass  surgically (realloy only curative option at its size) would be exceptionally risk and likely place him  on dialysis which I feel is MUCH worse than its direct effects. Recommend observation only, consider therapy only if began to exhibit major symptoms, which is very unlikely. Discussed with son and wife who are in agreement.   2 - Stage 4 Renal Insufficiency - progressive medical renal disease. Appreciate nephrology comangemnt. He empties well w/o obstriction.   3 - Lower Urinary Tract Symptoms - continue current regimen.   I will follow him PRN at this point, please call me directly with questions anytime.   Alexis Frock 11/10/2019, 12:45 PM

## 2019-11-10 NOTE — Anesthesia Procedure Notes (Signed)
Procedure Name: MAC Date/Time: 11/10/2019 12:10 PM Performed by: Imagene Riches, CRNA Pre-anesthesia Checklist: Patient identified, Emergency Drugs available, Suction available and Timeout performed Patient Re-evaluated:Patient Re-evaluated prior to induction Oxygen Delivery Method: Nasal cannula

## 2019-11-10 NOTE — Anesthesia Preprocedure Evaluation (Addendum)
Anesthesia Evaluation  Patient identified by MRN, date of birth, ID band Patient awake    Reviewed: Allergy & Precautions, NPO status , Patient's Chart, lab work & pertinent test results, reviewed documented beta blocker date and time   History of Anesthesia Complications Negative for: history of anesthetic complications  Airway Mallampati: II  TM Distance: >3 FB Neck ROM: Full    Dental  (+) Dental Advisory Given   Pulmonary asthma , sleep apnea and Continuous Positive Airway Pressure Ventilation , COPD,  COPD inhaler, former smoker,    Pulmonary exam normal        Cardiovascular hypertension, Pt. on medications and Pt. on home beta blockers + CAD, + Cardiac Stents (2005) and +CHF  Normal cardiovascular exam     Neuro/Psych PSYCHIATRIC DISORDERS Dementia  Hearing loss b/l  TIACVA, No Residual Symptoms    GI/Hepatic Neg liver ROS, hiatal hernia, GERD  Medicated and Controlled,  Endo/Other  diabetes, Type 2, Insulin Dependent  Renal/GU CRFRenal disease Bladder dysfunction      Musculoskeletal  (+) Arthritis ,   Abdominal   Peds  Hematology  (+) anemia ,   Anesthesia Other Findings Covid test negative   Reproductive/Obstetrics                            Anesthesia Physical Anesthesia Plan  ASA: III  Anesthesia Plan: Regional   Post-op Pain Management:    Induction: Intravenous  PONV Risk Score and Plan: 1 and Propofol infusion and Treatment may vary due to age or medical condition  Airway Management Planned: Natural Airway and Simple Face Mask  Additional Equipment: None  Intra-op Plan:   Post-operative Plan:   Informed Consent: I have reviewed the patients History and Physical, chart, labs and discussed the procedure including the risks, benefits and alternatives for the proposed anesthesia with the patient or authorized representative who has indicated his/her  understanding and acceptance.   Patient has DNR.  Discussed DNR with power of attorney and Suspend DNR.   Consent reviewed with POA  Plan Discussed with: CRNA, Anesthesiologist and Surgeon  Anesthesia Plan Comments:        Anesthesia Quick Evaluation

## 2019-11-10 NOTE — Interval H&P Note (Signed)
History and Physical Interval Note:  11/10/2019 11:39 AM  [name]  has presented today for surgery, with the diagnosis of infected left olecranon bursa.  The various methods of treatment have been discussed with the patient and family. After consideration of risks, benefits and other options for treatment, the patient has consented to  Procedure(s): IRRIGATION AND DEBRIDEMENT EXTREMITY, left olecranon bursa (Left) as a surgical intervention.  The patient's history has been reviewed, patient examined, no change in status, stable for surgery.  I have reviewed the patient's chart and labs.  Questions were answered to the patient's satisfaction.     Renette Butters   I have discussed case with his son and examined his elbow. We discussed the risks and benefits and all would like to go forward with surgical I&D under regional/local anesthetic.    Renette Butters

## 2019-11-10 NOTE — Anesthesia Postprocedure Evaluation (Signed)
Anesthesia Post Note  Patient: [name]  Procedure(s) Performed: IRRIGATION AND DEBRIDEMENT EXTREMITY, left olecranon bursa (Left )     Patient location during evaluation: PACU Anesthesia Type: Regional Level of consciousness: confused, awake and patient cooperative (at baseline) Pain management: pain level controlled Vital Signs Assessment: post-procedure vital signs reviewed and stable Respiratory status: spontaneous breathing, nonlabored ventilation and respiratory function stable Cardiovascular status: stable and blood pressure returned to baseline Anesthetic complications: no   No complications documented.  Last Vitals:  Vitals:   11/10/19 1250 11/10/19 1301  BP:  (!) 149/59  Pulse:  (!) 55  Resp:  17  Temp: 36.7 C (!) 36.3 C  SpO2:  97%    Last Pain:  Vitals:   11/10/19 1301  TempSrc: Oral  PainSc: 0-No pain                 Audry Pili

## 2019-11-10 NOTE — Progress Notes (Signed)
PROGRESS NOTE    [name]  AXE:[id number] DOB: 1928-07-14 DOA: 11/06/2019 PCP: Leanna Battles, MD      Brief Narrative:  [name] is a 86 y.o. M with hx mod dementia, home dwelling, CAD s/p PCI 2005, HTN, COPD not on O2, DM, CKD IIIa baseline 1.2 per our records, OSA on CPAP and possible TIA who presented with few days of progressive confusion and swelling (thought to be legs and arm/face).  Of note, patient had recently been seen by orthopedics for arm swelling, x-rays showed no fracture, thought to have cellulitis.  In the ER, CXR showed edema and small effusion, K 6.2, Cr 2.3, Bicarb 19.  ECG with sinus bradycardia.  Also, lactate >3 and WBC 14K with UA suggesting UTI.  Cardiology and Pulmonology were consulted.  Patient started on IV Lasix for CHF, IV Rocephin for UTI.         Assessment & Plan:   Acute renal failure on CKD stage III Baseline creatinine 1.2.  On admission creatinine 2.3, unclear chronicity.    UA showed pyuria, no RBCs.  FeNA 1.7%, intrinsic.  Renal imaging noted a 4 cm kidney mass, but no hydronephrosis.  No NSAID exposure, had been off ARB for some time per family.  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  Renal function worsened, diuretics reduced, renal fxn worsened again and so diuretics held and Cr started to improve.  Restarted Lasix 40 IV yesterday and Cr down to 2.8 today.    1L UOP yesterday  -Continue Lasix 40 IV once daily -Consult Neph, appreciate cares  -Avoid ARB -Avoid nephrotoxins -Strict I/Os     Cellulitis of the left arm with olecranon bursitis Swelling developed in the left forearm several days prior to admission.  Olecranon bursa aspirated by orthopedics, MSSA in culture.  Sepsis was ruled out.  Patient to have I&D today with local anesthesia. -Continue ceftriaxone   Diastolic diastolic CHF Presented with SOB leg swelling, CXR with  interstitial edema and BNP 477.  Admitted and started on IV Lasix  Echo showed normal EF, grade 2 diastolic dysfunction, normal valves.  Net -300 cc yesterday, -1.9 L on admission.  Creatinine improving. -Continue Lasix 40 daily -Strict I's and O's -Monitor potassium   Microcytic anemia Ferritin low normal, TIBC, iron sat and sat ratio all low.  No clear history GI blood loss.  RI low/hypoproliferative.  Feraheme given 7/10  Hemoglobin stable -May resume Plavix postop    Possible UTI Presented with leukocytosis, pyuria. -Covered by Rocephin -Follow-up urine culture, not previously collected   Renal mass 4.8 x 4.3 cm soft tissue mass noted incidentally in the RIGHT kidney.  Will need contrasted study eventually.  Family are discussing goals of care, they are mulling whether further work up of this is patient's wishes -Obtain MRI abdomen with and without gadolinium when GFR improves -Outpatient neurology follow-up, pending goals of care  COPD without exacerbation OSA -Continue montelukast, Zyrtec, duo nebs, Pulmicort, CPAP -Hold home Trelegy -Continue home prednisone at chronic 7.5 daily dose    Vascular disease, secondary prevention Hypertension Blood pressure high normal -Hold telmisartan -Continue amlodipine -Restart nebivolol tomorrow -Continue low-dose aspirin, hold Plavix   Diabetes A1c 9.4%. Glucose controlled -Continue Januvia -Hold Metformin -Continue sliding scale correction insulin  Dementia with superimposed acute metabolic encephalopathy due to renal failure, infection -Continue donepezil, memantine, escitalopram -Mobilize, PT/OT  BPH LUTS -Continue mirabegron and finasteride  GERD -Continue PPI      Hyperkalemia Acute metabolic acidosis Potassium and acidosis are resolved with diuresis.          Disposition: Status is: Inpatient  Remains inpatient appropriate because:he has persistent encephalopathy, requires ongoing IV  antibiotics and IV lasix and close monitoring renal function   Dispo: The patient is from: Home              Anticipated d/c is to: TBD              Anticipated d/c date is: >3 days              Patient currently is not medically stable to d/c.              MDM: The below labs and imaging reports reviewed and summarized above.  Medication management as above.     DVT prophylaxis: heparin injection 5,000 Units Start: 11/06/19 1530  Code Status: DNR Family Communication: Son at the bedside    Consultants:   Cardiology  Pulmonology  Orthopedics  Nephrology  Procedures:   7/7 renal ultrasound --no hydronephrosis  7/7 CT abdomen and pelvis --renal mass noted, no other abnormalities  Antimicrobials:   Ceftriaxone 7/7 >>  Vancomycin 1 g x 1 7/8  Doxycycline x1 7/8   Culture data:   7/8 urine culture pending  7/8 blood culture x2 no growth   7/8 olecranon bursa aspiration-MSSA          Subjective: 1 episode of hypothermia overnight.  Pulling at his arm brace overnight.  No respiratory distress, cough, vomiting, fever, pain complaints.  Swelling worse in the left arm.  Patient is sleeping and still moderately encephalopathic, unable to provide independent history to me.  All history collected from family at the bedside.      Objective: Vitals:   11/09/19 2220 11/09/19 2221 11/09/19 2329 11/10/19 0409  BP:   (!) 153/59 (!) 156/74  Pulse:  60 65 61  Resp: 14 14 14 12   Temp:   (!) 97.5 F (36.4 C) (!) 96.1 F (35.6 C)  TempSrc:   Axillary Axillary  SpO2: 100% 100% 96% 96%  Weight:    80.4 kg  Height:        Intake/Output Summary (Last 24 hours) at 11/10/2019 0913 Last data filed at 11/10/2019 0645 Gross per 24 hour  Intake 417 ml  Output 885 ml  Net -468 ml   Filed Weights   11/08/19 0420 11/09/19 0500 11/10/19 0409  Weight: 82.5 kg 80.9 kg 80.4 kg    Examination: General appearance: Elderly adult male, lying in bed,  sleeping.  In no obvious distress.     HEENT:    Skin: The arms not unwrapped, but there is no redness spreading above the dressing, edema left hand appears swollen similar in appearance to yesterday. Cardiac: RRR, no murmurs, no lower extremity edema Respiratory: Respiratory effort normal, sleeping, no rales or wheezing. Abdomen: Mid to palpation, no ascites or distention. MSK:  Neuro:    Psych: Sleeping, attention diminished to my presence      Data Reviewed: I have personally reviewed following labs and imaging studies:  CBC: Recent Labs  Lab 11/06/19 1126 11/06/19 1833 11/07/19 0241 11/07/19 0245 11/08/19 0306 11/09/19 0304 11/10/19 0340  WBC 14.5*  --  11.5*  --  12.7* 12.9* 12.9*  HGB 9.0*   < > 8.7* 9.2* 8.2* 8.5* 8.7*  HCT 28.9*   < > 28.3* 27.0* 26.5* 27.1* 28.0*  MCV 79.8*  --  78.6*  --  77.5* 78.1* 78.0*  PLT 214  --  214  --  194 220 217   < > = values in this interval not displayed.   Basic Metabolic Panel: Recent Labs  Lab 11/06/19 1126 11/06/19 1420 11/07/19 0241 11/07/19 0245 11/08/19 0306 11/09/19 0304 11/10/19 0340  NA 137   < > 139 140 138 139 141  K 6.2*   < > 5.1 5.1 4.4 4.0 3.7  CL 106   < > 105 103 101 103 104  CO2 19*  --  23  --  25 22 25   GLUCOSE 190*   < > 123* 119* 205* 191* 161*  BUN 40*   < > 36* 37* 45* 48* 44*  CREATININE 2.32*   < > 2.52* 2.70* 3.37* 3.09* 2.79*  CALCIUM 9.0  --  9.2  --  8.6* 8.3* 8.6*  PHOS  --   --   --   --   --  6.2* 4.9*   < > = values in this interval not displayed.   GFR: Estimated Creatinine Clearance: 18.6 mL/min (A) (by C-G formula based on SCr of 2.79 mg/dL (H)). Liver Function Tests: Recent Labs  Lab 11/09/19 0304 11/10/19 0340  ALBUMIN 2.3* 2.5*   No results for input(s): LIPASE, AMYLASE in the last 168 hours. No results for input(s): AMMONIA in the last 168 hours. Coagulation Profile: No results for input(s): INR, PROTIME in the last 168 hours. Cardiac Enzymes: No results for  input(s): CKTOTAL, CKMB, CKMBINDEX, TROPONINI in the last 168 hours. BNP (last 3 results) No results for input(s): PROBNP in the last 8760 hours. HbA1C: No results for input(s): HGBA1C in the last 72 hours. CBG: Recent Labs  Lab 11/09/19 0635 11/09/19 1214 11/09/19 1725 11/09/19 2120 11/10/19 0642  GLUCAP 155* 260* 101* 152* 144*   Lipid Profile: No results for input(s): CHOL, HDL, LDLCALC, TRIG, CHOLHDL, LDLDIRECT in the last 72 hours. Thyroid Function Tests: No results for input(s): TSH, T4TOTAL, FREET4, T3FREE, THYROIDAB in the last 72 hours. Anemia Panel: No results for input(s): VITAMINB12, FOLATE, FERRITIN, TIBC, IRON, RETICCTPCT in the last 72 hours. Urine analysis:    Component Value Date/Time   COLORURINE YELLOW 11/08/2019 1633   APPEARANCEUR HAZY (A) 11/08/2019 1633   LABSPEC 1.014 11/08/2019 1633   PHURINE 5.0 11/08/2019 1633   GLUCOSEU NEGATIVE 11/08/2019 1633   HGBUR NEGATIVE 11/08/2019 1633   BILIRUBINUR NEGATIVE 11/08/2019 1633   KETONESUR NEGATIVE 11/08/2019 1633   PROTEINUR NEGATIVE 11/08/2019 1633   UROBILINOGEN 0.2 09/24/2014 1442   NITRITE NEGATIVE 11/08/2019 1633   LEUKOCYTESUR LARGE (A) 11/08/2019 1633   Sepsis Labs: @LABRCNTIP (procalcitonin:4,lacticacidven:4)  ) Recent Results (from the past 240 hour(s))  SARS Coronavirus 2 by RT PCR (hospital order, performed in [facility] hospital lab) Nasopharyngeal Nasopharyngeal Swab     Status: None   Collection Time: 11/06/19  2:34 PM   Specimen: Nasopharyngeal Swab  Result Value Ref Range Status   SARS Coronavirus 2 NEGATIVE NEGATIVE Final    Comment: (NOTE) SARS-CoV-2 target nucleic acids are NOT DETECTED.  The SARS-CoV-2 RNA is generally detectable in upper and lower respiratory specimens during the acute phase of infection. The lowest concentration of SARS-CoV-2 viral copies this assay can detect is 250 copies / mL. A negative result does not preclude SARS-CoV-2 infection and should not be used  as the sole basis for treatment or other patient management decisions.  A negative result may occur with improper  specimen collection / handling, submission of specimen other than nasopharyngeal swab, presence of viral mutation(s) within the areas targeted by this assay, and inadequate number of viral copies (<250 copies / mL). A negative result must be combined with clinical observations, patient history, and epidemiological information.  Fact Sheet for Patients:   StrictlyIdeas.no  Fact Sheet for Healthcare Providers: BankingDealers.co.za  This test is not yet approved or  cleared by the [place] FDA and has been authorized for detection and/or diagnosis of SARS-CoV-2 by FDA under an Emergency Use Authorization (EUA).  This EUA will remain in effect (meaning this test can be used) for the duration of the COVID-19 declaration under Section 564(b)(1) of the Act, 21 U.S.C. section 360bbb-3(b)(1), unless the authorization is terminated or revoked sooner.  Performed at [facility], [place] [address], [place], [place] [address]   Body fluid culture     Status: None   Collection Time: 11/07/19  1:48 PM   Specimen: Fluid  Result Value Ref Range Status   Specimen Description FLUID SYNOVIAL CYSTS  Final   Special Requests NONE  Final   Gram Stain   Final    ABUNDANT WBC PRESENT,BOTH PMN AND MONONUCLEAR RARE GRAM POSITIVE COCCI CRITICAL RESULT CALLED TO, READ BACK BY AND VERIFIED WITH: L HUYNH RN 11/07/19 1724 JDW Performed at [facility], [place] [address], [place], [place] [address]    Culture FEW STAPHYLOCOCCUS AUREUS  Final   Report Status 11/10/2019 FINAL  Final   Organism ID, Bacteria STAPHYLOCOCCUS AUREUS  Final      Susceptibility   Staphylococcus aureus - MIC*    CIPROFLOXACIN <=0.5 SENSITIVE Sensitive     ERYTHROMYCIN <=0.25 SENSITIVE Sensitive     GENTAMICIN <=0.5 SENSITIVE Sensitive     OXACILLIN <=0.25 SENSITIVE  Sensitive     TETRACYCLINE <=1 SENSITIVE Sensitive     VANCOMYCIN <=0.5 SENSITIVE Sensitive     TRIMETH/SULFA <=10 SENSITIVE Sensitive     CLINDAMYCIN <=0.25 SENSITIVE Sensitive     RIFAMPIN <=0.5 SENSITIVE Sensitive     Inducible Clindamycin NEGATIVE Sensitive     * FEW STAPHYLOCOCCUS AUREUS  Culture, blood (routine x 2)     Status: None (Preliminary result)   Collection Time: 11/07/19  7:17 PM   Specimen: BLOOD  Result Value Ref Range Status   Specimen Description BLOOD RIGHT ANTECUBITAL  Final   Special Requests   Final    BOTTLES DRAWN AEROBIC AND ANAEROBIC Blood Culture adequate volume   Culture   Final    NO GROWTH 2 DAYS Performed at [facility], [address], [place], [place] [address]    Report Status PENDING  Incomplete  Culture, blood (routine x 2)     Status: None (Preliminary result)   Collection Time: 11/07/19  7:25 PM   Specimen: BLOOD RIGHT HAND  Result Value Ref Range Status   Specimen Description BLOOD RIGHT HAND  Final   Special Requests   Final    BOTTLES DRAWN AEROBIC ONLY Blood Culture adequate volume   Culture   Final    NO GROWTH 2 DAYS Performed at [facility], [place] [address] [place], [place] [address]    Report Status PENDING  Incomplete         Radiology Studies: No results found.      Scheduled Meds: . amLODipine  5 mg Oral Daily  . aspirin EC  81 mg Oral Q supper  . brimonidine  1 drop Left Eye  BID  . budesonide (PULMICORT) nebulizer solution  0.25 mg Nebulization BID  . cetirizine  10 mg Oral Daily  . Chlorhexidine Gluconate Cloth  6 each Topical Daily  . cholecalciferol  2,000 Units Oral Q supper  . donepezil  10 mg Oral Q supper  . escitalopram  5 mg Oral Q supper  . finasteride  5 mg Oral Q supper  . furosemide  40 mg Intravenous Daily  . heparin injection (subcutaneous)  5,000 Units Subcutaneous Q12H  . insulin aspart  0-15 Units Subcutaneous TID WC  . insulin aspart  0-5 Units Subcutaneous QHS  .  ipratropium-albuterol  3 mL Nebulization BID  . latanoprost  1 drop Both Eyes QHS  . linagliptin  5 mg Oral Daily  . melatonin  5 mg Oral QHS  . memantine  10 mg Oral BID  . mirabegron ER  50 mg Oral Q supper  . montelukast  10 mg Oral Daily  . multivitamin with minerals  1 tablet Oral Daily  . pantoprazole  40 mg Oral BID  . predniSONE  7.5 mg Oral QAC breakfast  . sodium chloride flush  3 mL Intravenous Q12H   Continuous Infusions: . sodium chloride    . cefTRIAXone (ROCEPHIN)  IV 1 g (11/09/19 1201)  . ferumoxytol 510 mg (11/09/19 1344)     LOS: 4 days    Time spent: 25 minutes    Edwin Dada, MD [facility] 11/10/2019, 9:13 AM     Please page though [place] or Epic secure chat:  For [employer], Adult nurse

## 2019-11-11 ENCOUNTER — Encounter (HOSPITAL_COMMUNITY): Payer: Self-pay | Admitting: Orthopedic Surgery

## 2019-11-11 LAB — RENAL FUNCTION PANEL
Albumin: 2 g/dL — ABNORMAL LOW (ref 3.5–5.0)
Anion gap: 10 (ref 5–15)
BUN: 39 mg/dL — ABNORMAL HIGH (ref 8–23)
CO2: 25 mmol/L (ref 22–32)
Calcium: 8.3 mg/dL — ABNORMAL LOW (ref 8.9–10.3)
Chloride: 105 mmol/L (ref 98–111)
Creatinine, Ser: 2.6 mg/dL — ABNORMAL HIGH (ref 0.61–1.24)
GFR calc Af Amer: 24 mL/min — ABNORMAL LOW (ref >60)
GFR calc non Af Amer: 21 mL/min — ABNORMAL LOW (ref >60)
Glucose, Bld: 111 mg/dL — ABNORMAL HIGH (ref 70–99)
Phosphorus: 4.6 mg/dL (ref 2.5–4.6)
Potassium: 4.3 mmol/L (ref 3.5–5.1)
Sodium: 140 mmol/L (ref 135–145)

## 2019-11-11 LAB — GLUCOSE, CAPILLARY
Glucose-Capillary: 91 mg/dL (ref 70–99)
Glucose-Capillary: 114 mg/dL — ABNORMAL HIGH (ref 70–99)
Glucose-Capillary: 280 mg/dL — ABNORMAL HIGH (ref 70–99)
Glucose-Capillary: 219 mg/dL — ABNORMAL HIGH (ref 70–99)

## 2019-11-11 LAB — CBC
HCT: 24.3 % — ABNORMAL LOW (ref 39.0–52.0)
Hemoglobin: 7.5 g/dL — ABNORMAL LOW (ref 13.0–17.0)
MCH: 24.5 pg — ABNORMAL LOW (ref 26.0–34.0)
MCHC: 30.9 g/dL (ref 30.0–36.0)
MCV: 79.4 fL — ABNORMAL LOW (ref 80.0–100.0)
Platelets: 202 10*3/uL (ref 150–400)
RBC: 3.06 MIL/uL — ABNORMAL LOW (ref 4.22–5.81)
RDW: 17.3 % — ABNORMAL HIGH (ref 11.5–15.5)
WBC: 11.6 10*3/uL — ABNORMAL HIGH (ref 4.0–10.5)
nRBC: 0.3 % — ABNORMAL HIGH (ref 0.0–0.2)

## 2019-11-11 LAB — IMMUNOFIXATION, URINE

## 2019-11-11 MED ORDER — DIPHENHYDRAMINE-ZINC ACETATE 2-0.1 % EX CREA
TOPICAL_CREAM | Freq: Three times a day (TID) | CUTANEOUS | Status: DC | PRN
  Filled 2019-11-11: qty 28

## 2019-11-11 MED ORDER — INSULIN ASPART 100 UNIT/ML ~~LOC~~ SOLN
0.0000 [IU] | Freq: Three times a day (TID) | SUBCUTANEOUS | Status: DC
  Administered 2019-11-11 – 2019-11-12 (×2): 5 [IU] via SUBCUTANEOUS
  Administered 2019-11-12 (×2): 2 [IU] via SUBCUTANEOUS
  Administered 2019-11-13 (×2): 3 [IU] via SUBCUTANEOUS
  Administered 2019-11-13: 2 [IU] via SUBCUTANEOUS

## 2019-11-11 MED ORDER — CEPHALEXIN 500 MG PO CAPS
500.0000 mg | ORAL_CAPSULE | Freq: Three times a day (TID) | ORAL | Status: DC
  Administered 2019-11-12 – 2019-11-13 (×5): 500 mg via ORAL
  Filled 2019-11-11 (×5): qty 1

## 2019-11-11 NOTE — Progress Notes (Addendum)
Pt is on ceftriaxone for MSSA infected olecranon bursa. He is s/p I&D by ortho. He has been on 4 days of ceftriaxone. Dr Loleta Books discussed with ID today and we will transition pt to Keflex to complete 14d of abx from time of I&D. We will use a little higher dose based on renal function.   CrCl~20 ml/min  Keflex 500mg  PO q8 to complete 14d of total therapy post I&D  Onnie Boer, PharmD, BCIDP, AAHIVP, CPP Infectious Disease Pharmacist 11/11/2019 2:01 PM

## 2019-11-11 NOTE — Progress Notes (Addendum)
Patient ID: [name], male   DOB: 1929/04/11, 86 y.o.   MRN: [id number] S:  S/p washout in OR yesterday with ortho.  I/Os yesterday 300 / 710.  Rec'd dose of lasix this AM. Awaiting breakfast - was inadvertently kept NPO post op.  Wife and nephew bedside.   O:BP (!) 135/59 (BP Location: Right Arm)   Pulse (!) 52   Temp (!) 95.8 F (35.4 C) (Axillary)   Resp 14   Ht 5\' 7"  (1.702 m)   Wt 83.1 kg   SpO2 98%   BMI 28.69 kg/m   Intake/Output Summary (Last 24 hours) at 11/11/2019 1012 Last data filed at 11/10/2019 2115 Gross per 24 hour  Intake 300 ml  Output 712 ml  Net -412 ml   Intake/Output: I/O last 3 completed shifts: In: 517 [I.V.:300; IV Piggyback:217] Out: 1037 [Urine:1035; Blood:2]  Intake/Output this shift:  No intake/output data recorded. Weight change: 2.7 kg Gen: NAD, resting comfortably CVS: no rub Resp: normal WOB at rest, just a few exp wheezes BL Abd: benign Ext: trace dependent thigh. LUE in ace wrap s/p OR.  GU: condom cath  Recent Labs  Lab 11/06/19 1126 11/06/19 1420 11/06/19 1833 11/07/19 0241 11/07/19 0245 11/08/19 0306 11/09/19 0304 11/10/19 0340 11/11/19 0242  NA 137   < > 139 139 140 138 139 141 140  K 6.2*   < > 5.4* 5.1 5.1 4.4 4.0 3.7 4.3  CL 106  --   --  105 103 101 103 104 105  CO2 19*  --   --  23  --  25 22 25 25   GLUCOSE 190*  --   --  123* 119* 205* 191* 161* 111*  BUN 40*  --   --  36* 37* 45* 48* 44* 39*  CREATININE 2.32*  --   --  2.52* 2.70* 3.37* 3.09* 2.79* 2.60*  ALBUMIN  --   --   --   --   --   --  2.3* 2.5* 2.0*  CALCIUM 9.0  --   --  9.2  --  8.6* 8.3* 8.6* 8.3*  PHOS  --   --   --   --   --   --  6.2* 4.9* 4.6   < > = values in this interval not displayed.   Liver Function Tests: Recent Labs  Lab 11/09/19 0304 11/10/19 0340 11/11/19 0242  ALBUMIN 2.3* 2.5* 2.0*   No results for input(s): LIPASE, AMYLASE in the last 168 hours. No results for input(s): AMMONIA in the last 168 hours. CBC: Recent Labs  Lab  11/07/19 0241 11/07/19 0245 11/08/19 0306 11/08/19 0306 11/09/19 0304 11/10/19 0340 11/11/19 0242  WBC 11.5*   < > 12.7*   < > 12.9* 12.9* 11.6*  HGB 8.7*   < > 8.2*   < > 8.5* 8.7* 7.5*  HCT 28.3*   < > 26.5*   < > 27.1* 28.0* 24.3*  MCV 78.6*  --  77.5*  --  78.1* 78.0* 79.4*  PLT 214   < > 194   < > 220 217 202   < > = values in this interval not displayed.   Cardiac Enzymes: No results for input(s): CKTOTAL, CKMB, CKMBINDEX, TROPONINI in the last 168 hours. CBG: Recent Labs  Lab 11/10/19 1226 11/10/19 1258 11/10/19 1644 11/10/19 2053 11/11/19 0626  GLUCAP 123* 132* 126* 118* 91    Iron Studies: No results for input(s): IRON, TIBC, TRANSFERRIN, FERRITIN in the last  72 hours. Studies/Results: No results found. Marland Kitchen amLODipine  5 mg Oral Daily  . aspirin EC  81 mg Oral Q supper  . brimonidine  1 drop Left Eye BID  . budesonide (PULMICORT) nebulizer solution  0.25 mg Nebulization BID  . cetirizine  10 mg Oral Daily  . Chlorhexidine Gluconate Cloth  6 each Topical Daily  . cholecalciferol  2,000 Units Oral Q supper  . docusate sodium  100 mg Oral BID  . escitalopram  5 mg Oral Q supper  . finasteride  5 mg Oral Q supper  . furosemide  40 mg Intravenous Daily  . heparin injection (subcutaneous)  5,000 Units Subcutaneous Q12H  . insulin aspart  0-15 Units Subcutaneous TID WC  . insulin aspart  0-5 Units Subcutaneous QHS  . ipratropium-albuterol  3 mL Nebulization BID  . latanoprost  1 drop Both Eyes QHS  . linagliptin  5 mg Oral Daily  . melatonin  5 mg Oral QHS  . memantine  10 mg Oral BID  . mirabegron ER  50 mg Oral Q supper  . montelukast  10 mg Oral Daily  . multivitamin with minerals  1 tablet Oral Daily  . pantoprazole  40 mg Oral BID  . predniSONE  7.5 mg Oral QAC breakfast  . sodium chloride flush  3 mL Intravenous Q12H    BMET    Component Value Date/Time   NA 140 11/11/2019 0242   K 4.3 11/11/2019 0242   CL 105 11/11/2019 0242   CO2 25 11/11/2019  0242   GLUCOSE 111 (H) 11/11/2019 0242   BUN 39 (H) 11/11/2019 0242   CREATININE 2.60 (H) 11/11/2019 0242   CALCIUM 8.3 (L) 11/11/2019 0242   GFRNONAA 21 (L) 11/11/2019 0242   GFRAA 24 (L) 11/11/2019 0242   CBC    Component Value Date/Time   WBC 11.6 (H) 11/11/2019 0242   RBC 3.06 (L) 11/11/2019 0242   HGB 7.5 (L) 11/11/2019 0242   HCT 24.3 (L) 11/11/2019 0242   PLT 202 11/11/2019 0242   MCV 79.4 (L) 11/11/2019 0242   MCH 24.5 (L) 11/11/2019 0242   MCHC 30.9 11/11/2019 0242   RDW 17.3 (H) 11/11/2019 0242   LYMPHSABS 1.2 05/07/2017 1652   MONOABS 0.8 05/07/2017 1652   EOSABS 1.0 (H) 05/07/2017 1652   BASOSABS 0.0 05/07/2017 1652    Assessment/Plan: 1. Non-oliguric AKI/CKD stage IIIa- in setting of acute on chronic diastolic CHF and IV diuresis as well as septic olecranon bursitis. He did receive IV vanco 1 gram on 11/07/19. Now on rocephin. Renal US without obstruction and c/w chronic medical renal disease. Likely due cardiorenal syndrome vs ischemic ATN.  1. Continue IV lasix 40 mg daily for now.  2. Continue to follow renal function and UOP --> seeing some improvement in renal function after lasix was held then resumed at lower dose.  Had some very transient modest hypotension yesterday in OR, hopefully won't see a bump in creatinine tomorrow. 3. Renal dose meds 4. Avoid IV contrast and nephrotoxic agents. 2. Acute on chronic diastolic CHF- now grade II DD on ECHO. Responding to IV lasix.  Maintain dose at lasix 40 IV daily for now. 3. Septic left olecranon bursitis with cellulitis- +staph aureus on gram stain. Given one dose of vanco and now on rocephin. Ortho following and s/p OR 7/11. 4. Hyperkalemia- improved with lokelma and IV lasix 5. Renal mass- right kidney, soft tissue mass concerning for RCC. Urology has been consulted --> slow growing  and nonmetastatic and unlikely to affect life expectancy.  Rec observation.   6. DM- per primary 7. COPD- improved with  bronchodilators. On chronic prednisone 8. OSA on CPAP qhs 9. Anemia of CKD and iron deficiency. TSAT 13%, ferritin 60, iron 38. IV feraheme - s/p 1 dose 7/10, ordered weekly - if d/c prior to Sat would try to give early. Transfuse prn.  I'm reluctant to give ESA with renal mass but if Hb trending down will further discuss risk:benefit with family. 10. HTN- stable 11. Dementia- stable and family at bedside. 12. CAD- stable 13. Disposition- pt is DNR.  Family requested I call his son to update today --> ph number [phone].  I will do so.   Jannifer Hick MD [facility] Pager [phone]

## 2019-11-11 NOTE — TOC Initial Note (Signed)
Transition of Care [facility]) - Initial/Assessment Note    Patient Details  Name: [name] MRN: [id number] Date of Birth: 06/21/1928  [facility] CM/SW Contact:    Vinie Sill, [place] Phone Number: 11/11/2019, 4:16 PM  Clinical Narrative:                  CSW spoke with patient's on,Dr, Einar Grad. CSW introduced self and explained role. CSW discussed PT recommendation of short term rehab at [facility]. Patient's son was agreeable to rehab at [facility]. Preferred SNF is [facility]. CSW was given permission to send SNF referrals to others SNFs as back up plan. Patient has received covid vaccines.  CSW sent referral and contacted [facility]-waiting on response.  CSW will continue to follow and assist with discharge planning.  Thurmond Butts, MSW, [place] Clinical Social Worker   Expected Discharge Plan: Skilled Nursing Facility Barriers to Discharge: Continued Medical Work up   Patient Goals and CMS Choice        Expected Discharge Plan and Services Expected Discharge Plan: [place] In-house Referral: Clinical Social Work     Living arrangements for the past 2 months: Single Family Home                                      Prior Living Arrangements/Services Living arrangements for the past 2 months: Single Family Home Lives with:: Self, Spouse Patient language and need for interpreter reviewed:: No                 Activities of Daily Living      Permission Sought/Granted Permission sought to share information with : Family Supports Permission granted to share information with : Yes, Verbal Permission Granted  Share Information with NAME: Dr Berneta Sages  Permission granted to share info w AGENCY: SNFs  Permission granted to share info w Relationship: son  Permission granted to share info w Contact Information: [phone]  Emotional Assessment       Orientation: : Oriented to Self Alcohol / Substance Use: Not  Applicable Psych Involvement: No (comment)  Admission diagnosis:  Hyperkalemia [E87.5] CHF (congestive heart failure) (HCC) [I50.9] AKI (acute kidney injury) ([place]) [N17.9] Acute renal failure, unspecified acute renal failure type ([place]) [N17.9] Congestive heart failure, unspecified HF chronicity, unspecified heart failure type [facility]) [I50.9] Patient Active Problem List   Diagnosis Date Noted  . Acute on chronic diastolic CHF (congestive heart failure) ([place]) 11/06/2019  . CHF (congestive heart failure) ([place]) 11/06/2019  . Acute renal failure ([place])   . Hyperkalemia   . Fall 05/07/2017  . Alzheimer's dementia without behavioral disturbance ([place]) 10/10/2015  . Dementia ([place]) 04/22/2015  . Alzheimer's disease ([place]) 04/22/2015  . Community acquired pneumonia 02/24/2013    Class: Acute  . Iatrogenic adrenal insufficiency (HCC) 02/24/2013    Class: Chronic  . Type II or unspecified type diabetes mellitus with neurological manifestations, not stated as uncontrolled(250.60) 01/04/2013    Class: Chronic  . Coronary artery disease 01/03/2012  . OSA (obstructive sleep apnea) 03/13/2007  . COPD (chronic obstructive pulmonary disease) ([place]) 03/13/2007   PCP:  Leanna Battles, MD Pharmacy:   [id number] - , [place] [address] [place] [address] Phone: [phone] Fax: [phone]     Social Determinants of Health (SDOH) Interventions    Readmission Risk Interventions No flowsheet data  found.  

## 2019-11-11 NOTE — Progress Notes (Signed)
Occupational Therapy Treatment Patient Details Name: [name] MRN: [id number] DOB: [birth year] Today's Date: 11/11/2019    History of present illness Pt is an 86 yo male presenting with increased SOB, workup revealed acute on chronic CHF decompensation with hyperkalemia, UTI, and AKI. Patient also found to have septic olecranon bursitis in L elbow s/p I&D on 7/11 by Dr. Percell [name]. PMH includes: dementia, CAD s/p stent placement, COPD, DM, HTN, and GERD.   OT comments  Patient making progress toward goals demonstrating Mod A for bed mobility with HOB flat and Min A for sit <> stand and for stand-pivot transfers with RW this date. Education on incorporation of LUE in functional activities with patient and family at bedside expressing verbal understanding. LUE HEP in sitting for 2 sets x10 reps each to promote soft tissue elongate and decrease likelihood of stiffness with patient requiring cues for pace/form. Patient would continue to benefit from acute OT services in prep for safe d/c home with family.    Follow Up Recommendations  Home health OT;Supervision/Assistance - 24 hour    Equipment Recommendations  3 in 1 bedside commode    Recommendations for Other Services      Precautions / Restrictions Precautions Precautions: Fall Precaution Comments: WBAT LUE. No ROM restrictions.  Required Braces or Orthoses: Sling;Splint/Cast Splint/Cast: L arm posterior long arm splint, shoulder immobilizer Restrictions Weight Bearing Restrictions: Yes LUE Weight Bearing: Weight bearing as tolerated Other Position/Activity Restrictions: LUE sling removed for functional mobility with RW.        Mobility Bed Mobility Overal bed mobility: Needs Assistance Bed Mobility: Supine to Sit     Supine to sit: Mod assist     General bed mobility comments: With HOB flat and assist to bring trunk upright.   Transfers Overall transfer level: Needs assistance Equipment used: Rolling walker (2  wheeled) Transfers: Sit to/from [facility] Sit to Stand: Min assist Stand pivot transfers: Min assist       General transfer comment: Min A for STS from elevated EOB and Min A for SPT to recliner on L.     Balance Overall balance assessment: Needs assistance Sitting-balance support: No upper extremity supported;Feet supported Sitting balance-Leahy Scale: Fair Sitting balance - Comments: Able to maintain sitting balance at EOB with LUE HEP.    Standing balance support: Bilateral upper extremity supported Standing balance-Leahy Scale: Fair Standing balance comment: Heavy reliance on BUE on RW.                            ADL either performed or assessed with clinical judgement   ADL                                               Vision       Perception     Praxis      Cognition Arousal/Alertness: Awake/alert Behavior During Therapy: WFL for tasks assessed/performed Overall Cognitive Status: Impaired/Different from baseline Area of Impairment: Following commands;Memory;Safety/judgement;Attention                   Current Attention Level: Sustained Memory: Decreased short-term memory Following Commands: Follows one step commands consistently;Follows one step commands with increased time Safety/Judgement: Decreased awareness of deficits   Problem Solving: Slow processing;Requires verbal cues  Exercises Exercises: General Upper Extremity General Exercises - Upper Extremity Shoulder Flexion: AROM;20 reps;Seated Shoulder Extension: AROM;20 reps;Seated Elbow Flexion: AROM;20 reps;Seated Elbow Extension: AROM;20 reps;Seated Wrist Flexion: AROM;20 reps;Seated Wrist Extension: AROM;20 reps;Seated   Shoulder Instructions       General Comments Patient with decreased need for assistance with functional transfers and mobility this date compared to previous tx. sessions.     Pertinent Vitals/ Pain        Pain Assessment: No/denies pain  Home Living                                          Prior Functioning/Environment              Frequency  Min 2X/week        Progress Toward Goals  OT Goals(current goals can now be found in the care plan section)  Progress towards OT goals: Progressing toward goals  Acute Rehab OT Goals Patient Stated Goal: To get stronger.  OT Goal Formulation: With patient/family Time For Goal Achievement: 11/22/19 Potential to Achieve Goals: Good ADL Goals Pt Will Perform Upper Body Bathing: with supervision;sitting Pt Will Perform Lower Body Bathing: with min assist;sit to/from stand Pt Will Perform Upper Body Dressing: with set-up;sitting Pt Will Perform Lower Body Dressing: with min assist;sit to/from stand Pt Will Transfer to Toilet: with supervision;ambulating;bedside commode Pt Will Perform Toileting - Clothing Manipulation and hygiene: with supervision;sit to/from stand Pt Will Perform Tub/Shower Transfer: with min guard assist;ambulating;shower seat;rolling walker  Plan Discharge plan remains appropriate    Co-evaluation                 AM-PAC OT "6 Clicks" Daily Activity     Outcome Measure   Help from another person eating meals?: A Little Help from another person taking care of personal grooming?: A Little Help from another person toileting, which includes using toliet, bedpan, or urinal?: A Lot Help from another person bathing (including washing, rinsing, drying)?: A Lot Help from another person to put on and taking off regular upper body clothing?: A Lot Help from another person to put on and taking off regular lower body clothing?: A Lot 6 Click Score: 14    End of Session Equipment Utilized During Treatment: Gait belt;Rolling walker  OT Visit Diagnosis: Unsteadiness on feet (R26.81);Muscle weakness (generalized) (M62.81);Repeated falls (R29.6)   Activity Tolerance Patient tolerated treatment well    Patient Left     Nurse Communication          Time: 2330-0762 OT Time Calculation (min): 23 min  Charges: OT General Charges $OT Visit: 1 Visit OT Treatments $Therapeutic Activity: 8-22 mins $Therapeutic Exercise: 8-22 mins  [name] H. OTR/L Supplemental OT, Department of rehab services [phone]   [name] R H. 11/11/2019, 12:51 PM

## 2019-11-11 NOTE — Progress Notes (Signed)
PROGRESS NOTE    [name]  BDZ:[id number] DOB: [birth year] DOA: 11/06/2019 PCP: Robert Battles, MD      Brief Narrative:  [name] is a 86 y.o. M with hx mod dementia, home dwelling, CAD s/p PCI 2005, HTN, COPD not on O2, DM, CKD IIIa baseline 1.2 per our records, OSA on CPAP and possible TIA who presented with few days of progressive confusion and swelling (thought to be legs and arm/face).  Of note, patient had recently been seen by orthopedics for arm swelling, x-rays showed no fracture, thought to have cellulitis.  In the ER, CXR showed edema and small effusion, K 6.2, Cr 2.3, Bicarb 19.  ECG with sinus bradycardia.  Also, lactate >3 and WBC 14K with UA suggesting UTI.  Cardiology and Pulmonology were consulted.  Patient started on IV Lasix for CHF, IV Rocephin for UTI.         Assessment & Plan:   Acute renal failure on CKD stage IIIa Baseline creatinine 1.2.  On admission creatinine 2.3, unclear chronicity.    UA showed pyuria, no RBCs.  FeNA 1.7%, intrinsic.  Renal imaging noted a 4 cm kidney mass, but no hydronephrosis.  No NSAID exposure, had been off ARB for some time per family.  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  Renal function worsened, diuretics reduced, renal fxn worsened again and so diuretics held and Cr started to improve.  Still relatively good urine output yesterday, net negative another 400 cc.  -Continue cautious IV Lasix -Daily RFP  -Consult Neph, appreciate cares  -Avoid ARB -Avoid nephrotoxins -Strict I/Os     Cellulitis of the left arm with olecranon bursitis S/p olecranon bursa I&D/washout by Dr. Percell Miller on 7/11 Swelling developed in the left forearm several days prior to admission.  Olecranon bursa aspirated by orthopedics, MSSA in culture.  Sepsis was ruled out.  Patient underwent I&D yesterday, uncomplicated, purulent fluid expressed from the  bursa and bursectomy performed.  Discussed with ID, Dr., By phone, recommended 14 days of antibiotics from the point of bursectomy. -Transition to Keflex, day 1 of 14   Acute diastolic CHF Presented with SOB leg swelling, CXR with interstitial edema and BNP 477.  Admitted and started on IV Lasix  Echo showed normal EF, grade 2 diastolic dysfunction, normal valves.  Creatinine improving, net -100 cc again yesterday -Continue cautious IV Lasix -Strict I's and O's -Monitor potassium   Microcytic anemia Ferritin low normal, TIBC, iron sat and sat ratio all low.  No clear history GI blood loss.  RI low/hypoproliferative.  Feraheme given 7/10  Hemoglobin trending down postop -Continue to hold Plavix for now -Trend CBC    Possible UTI Presented with leukocytosis, pyuria.  Completed 5 days of IV antibiotics  Renal mass 4.8 x 4.3 cm soft tissue mass noted incidentally in the RIGHT kidney.  Urology were consulted who noted slow growth since imaging 10 years ago, suggesting very slow growth kinetics, unlikely more harm than benefit with nephrectomy.  Family in agreement.  COPD without exacerbation OSA -Continue montelukast, Zyrtec, duo nebs, Pulmicort, CPAP -Hold home Trelegy -Continue home prednisone at chronic 7.5 daily dose    Vascular disease, secondary prevention Hypertension Blood pressure remains high normal -Hold telmisartan -Continue amlodipine -Hold nebivolol -Continue low-dose aspirin, hold Plavix   Bradycardia -Hold nebivolol  Diabetes A1c 9.4%. Glucose controlled -Continue Januvia -Hold Metformin -Continue sliding scale correction insulin  Dementia with superimposed  acute metabolic encephalopathy due to renal failure, infection -Continue donepezil, memantine, Escitalopram -Mobilize, PT/OT  BPH LUTS -Continue mirabegron and finasteride  GERD -Continue PPI      Hyperkalemia Acute metabolic acidosis Potassium and acidosis are resolved with  diuresis.          Disposition: Status is: Inpatient  Remains inpatient appropriate because: although his encephalopathy is improving, he requires ongoing IV Lasix and close monitoring of renal function   Dispo: The patient is from: Home              Anticipated d/c is to: TBD              Anticipated d/c date is: 2-3 days              Patient currently is not medically stable to d/c.              MDM: The below labs and imaging reports reviewed and summarized above.  Medication management as above.    DVT prophylaxis: SCDs Start: 11/10/19 1307 heparin injection 5,000 Units Start: 11/06/19 1530  Code Status: DNR Family Communication: Cousin at the bedside few, wife at the bedside, son by phone    Consultants:   Cardiology  Pulmonology  Orthopedics  Nephrology  Procedures:   7/7 renal ultrasound --no hydronephrosis  7/7 CT abdomen and pelvis --renal mass noted, no other abnormalities  7/11 olecranon bursa I&D, bursectomy  Antimicrobials:   Ceftriaxone 7/7 >>  Vancomycin 1 g x 1 7/8  Doxycycline x1 7/8   Culture data:   7/8 urine culture pending  7/8 blood culture x2 no growth   7/8 olecranon bursa aspiration-MSSA          Subjective: No fever, hypothermia, respiratory distress, vomiting.  His arm is very itchy, patient is still somewhat confused, all history collected from nursing and family.      Objective: Vitals:   11/11/19 0731 11/11/19 0748 11/11/19 1249 11/11/19 1714  BP:  (!) 135/59 (!) 115/55 (!) 141/59  Pulse: (!) 51 (!) 52 (!) 57 (!) 55  Resp: 12 14 14 14   Temp:  (!) 95.8 F (35.4 C) (!) 97.4 F (36.3 C) 97.6 F (36.4 C)  TempSrc:  Axillary Oral Oral  SpO2: 96% 98% 98% 97%  Weight:      Height:        Intake/Output Summary (Last 24 hours) at 11/11/2019 1807 Last data filed at 11/11/2019 1722 Gross per 24 hour  Intake 240 ml  Output 1050 ml  Net -810 ml   Filed Weights   11/09/19 0500 11/10/19  0409 11/11/19 0252  Weight: 80.9 kg 80.4 kg 83.1 kg    Examination: General appearance: Elderly adult male, lying in bed, sleeping, arouses easily, interactive.     HEENT: Anicteric, conjunctival pink, lids and lashes appear normal for age.  Seems somewhat hard of hearing. Skin: He has scattered excoriations from scratching mostly in the arms.  No ulcers.  Orthopedics of asked that we not undressed the left arm.  But there is no redness pending from the top, and there is only mild tenderness to palpation. Cardiac: RRR, no murmurs, only trace lower extremity edema Respiratory: Respiratory effort normal, no rales or wheezing Abdomen: No tenderness to palpation or ascites. MSK:  Neuro:    Psych: Sleeping but arouses easily to voice, makes eye contact, answers appropriately simple questions.  Speech fluent.       Data Reviewed: I have personally reviewed following labs and imaging  studies:  CBC: Recent Labs  Lab 11/07/19 0241 11/07/19 0241 11/07/19 0245 11/08/19 0306 11/09/19 0304 11/10/19 0340 11/11/19 0242  WBC 11.5*  --   --  12.7* 12.9* 12.9* 11.6*  HGB 8.7*   < > 9.2* 8.2* 8.5* 8.7* 7.5*  HCT 28.3*   < > 27.0* 26.5* 27.1* 28.0* 24.3*  MCV 78.6*  --   --  77.5* 78.1* 78.0* 79.4*  PLT 214  --   --  194 220 217 202   < > = values in this interval not displayed.   Basic Metabolic Panel: Recent Labs  Lab 11/07/19 0241 11/07/19 0241 11/07/19 0245 11/08/19 0306 11/09/19 0304 11/10/19 0340 11/11/19 0242  NA 139   < > 140 138 139 141 140  K 5.1   < > 5.1 4.4 4.0 3.7 4.3  CL 105   < > 103 101 103 104 105  CO2 23  --   --  25 22 25 25   GLUCOSE 123*   < > 119* 205* 191* 161* 111*  BUN 36*   < > 37* 45* 48* 44* 39*  CREATININE 2.52*   < > 2.70* 3.37* 3.09* 2.79* 2.60*  CALCIUM 9.2  --   --  8.6* 8.3* 8.6* 8.3*  PHOS  --   --   --   --  6.2* 4.9* 4.6   < > = values in this interval not displayed.   GFR: Estimated Creatinine Clearance: 20.3 mL/min (A) (by C-G formula  based on SCr of 2.6 mg/dL (H)). Liver Function Tests: Recent Labs  Lab 11/09/19 0304 11/10/19 0340 11/11/19 0242  ALBUMIN 2.3* 2.5* 2.0*   No results for input(s): LIPASE, AMYLASE in the last 168 hours. No results for input(s): AMMONIA in the last 168 hours. Coagulation Profile: No results for input(s): INR, PROTIME in the last 168 hours. Cardiac Enzymes: No results for input(s): CKTOTAL, CKMB, CKMBINDEX, TROPONINI in the last 168 hours. BNP (last 3 results) No results for input(s): PROBNP in the last 8760 hours. HbA1C: No results for input(s): HGBA1C in the last 72 hours. CBG: Recent Labs  Lab 11/10/19 1644 11/10/19 2053 11/11/19 0626 11/11/19 1121 11/11/19 1727  GLUCAP 126* 118* 91 114* 280*   Lipid Profile: No results for input(s): CHOL, HDL, LDLCALC, TRIG, CHOLHDL, LDLDIRECT in the last 72 hours. Thyroid Function Tests: No results for input(s): TSH, T4TOTAL, FREET4, T3FREE, THYROIDAB in the last 72 hours. Anemia Panel: No results for input(s): VITAMINB12, FOLATE, FERRITIN, TIBC, IRON, RETICCTPCT in the last 72 hours. Urine analysis:    Component Value Date/Time   COLORURINE YELLOW 11/08/2019 1633   APPEARANCEUR HAZY (A) 11/08/2019 1633   LABSPEC 1.014 11/08/2019 1633   PHURINE 5.0 11/08/2019 1633   GLUCOSEU NEGATIVE 11/08/2019 1633   HGBUR NEGATIVE 11/08/2019 1633   BILIRUBINUR NEGATIVE 11/08/2019 1633   KETONESUR NEGATIVE 11/08/2019 1633   PROTEINUR NEGATIVE 11/08/2019 1633   UROBILINOGEN 0.2 09/24/2014 1442   NITRITE NEGATIVE 11/08/2019 1633   LEUKOCYTESUR LARGE (A) 11/08/2019 1633   Sepsis Labs: @LABRCNTIP (procalcitonin:4,lacticacidven:4)  ) Recent Results (from the past 240 hour(s))  SARS Coronavirus 2 by RT PCR (hospital order, performed in [facility] hospital lab) Nasopharyngeal Nasopharyngeal Swab     Status: None   Collection Time: 11/06/19  2:34 PM   Specimen: Nasopharyngeal Swab  Result Value Ref Range Status   SARS Coronavirus 2 NEGATIVE  NEGATIVE Final    Comment: (NOTE) SARS-CoV-2 target nucleic acids are NOT DETECTED.  The SARS-CoV-2 RNA is generally detectable  in upper and lower respiratory specimens during the acute phase of infection. The lowest concentration of SARS-CoV-2 viral copies this assay can detect is 250 copies / mL. A negative result does not preclude SARS-CoV-2 infection and should not be used as the sole basis for treatment or other patient management decisions.  A negative result may occur with improper specimen collection / handling, submission of specimen other than nasopharyngeal swab, presence of viral mutation(s) within the areas targeted by this assay, and inadequate number of viral copies (<250 copies / mL). A negative result must be combined with clinical observations, patient history, and epidemiological information.  Fact Sheet for Patients:   StrictlyIdeas.no  Fact Sheet for Healthcare Providers: BankingDealers.co.za  This test is not yet approved or  cleared by the [place] FDA and has been authorized for detection and/or diagnosis of SARS-CoV-2 by FDA under an Emergency Use Authorization (EUA).  This EUA will remain in effect (meaning this test can be used) for the duration of the COVID-19 declaration under Section 564(b)(1) of the Act, 21 U.S.C. section 360bbb-3(b)(1), unless the authorization is terminated or revoked sooner.  Performed at [facility], [place] [address], [place], [place] [address]   Body fluid culture     Status: None   Collection Time: 11/07/19  1:48 PM   Specimen: Fluid  Result Value Ref Range Status   Specimen Description FLUID SYNOVIAL CYSTS  Final   Special Requests NONE  Final   Gram Stain   Final    ABUNDANT WBC PRESENT,BOTH PMN AND MONONUCLEAR RARE GRAM POSITIVE COCCI CRITICAL RESULT CALLED TO, READ BACK BY AND VERIFIED WITH: L HUYNH RN 11/07/19 1724 JDW Performed at [facility], [place] [address], [place], [place] [address]    Culture FEW STAPHYLOCOCCUS AUREUS  Final   Report Status 11/10/2019 FINAL  Final   Organism ID, Bacteria STAPHYLOCOCCUS AUREUS  Final      Susceptibility   Staphylococcus aureus - MIC*    CIPROFLOXACIN <=0.5 SENSITIVE Sensitive     ERYTHROMYCIN <=0.25 SENSITIVE Sensitive     GENTAMICIN <=0.5 SENSITIVE Sensitive     OXACILLIN <=0.25 SENSITIVE Sensitive     TETRACYCLINE <=1 SENSITIVE Sensitive     VANCOMYCIN <=0.5 SENSITIVE Sensitive     TRIMETH/SULFA <=10 SENSITIVE Sensitive     CLINDAMYCIN <=0.25 SENSITIVE Sensitive     RIFAMPIN <=0.5 SENSITIVE Sensitive     Inducible Clindamycin NEGATIVE Sensitive     * FEW STAPHYLOCOCCUS AUREUS  Culture, blood (routine x 2)     Status: None (Preliminary result)   Collection Time: 11/07/19  7:17 PM   Specimen: BLOOD  Result Value Ref Range Status   Specimen Description BLOOD RIGHT ANTECUBITAL  Final   Special Requests   Final    BOTTLES DRAWN AEROBIC AND ANAEROBIC Blood Culture adequate volume   Culture   Final    NO GROWTH 4 DAYS Performed at [facility], [address], [place], [place] [address]    Report Status PENDING  Incomplete  Culture, blood (routine x 2)     Status: None (Preliminary result)   Collection Time: 11/07/19  7:25 PM   Specimen: BLOOD RIGHT HAND  Result Value Ref Range Status   Specimen Description BLOOD RIGHT HAND  Final   Special Requests   Final    BOTTLES DRAWN AEROBIC ONLY Blood Culture adequate volume   Culture   Final    NO GROWTH 4 DAYS Performed at [facility],  [address] [place], [place] [address]    Report Status PENDING  Incomplete         Radiology Studies: No results found.      Scheduled Meds: . amLODipine  5 mg Oral Daily  . aspirin EC  81 mg Oral Q supper  . brimonidine  1 drop Left Eye BID  . budesonide (PULMICORT) nebulizer solution  0.25 mg Nebulization BID  . [START ON 11/12/2019] cephALEXin  500 mg Oral Q8H  . cetirizine  10  mg Oral Daily  . Chlorhexidine Gluconate Cloth  6 each Topical Daily  . cholecalciferol  2,000 Units Oral Q supper  . docusate sodium  100 mg Oral BID  . escitalopram  5 mg Oral Q supper  . finasteride  5 mg Oral Q supper  . furosemide  40 mg Intravenous Daily  . heparin injection (subcutaneous)  5,000 Units Subcutaneous Q12H  . insulin aspart  0-9 Units Subcutaneous TID WC  . ipratropium-albuterol  3 mL Nebulization BID  . latanoprost  1 drop Both Eyes QHS  . linagliptin  5 mg Oral Daily  . melatonin  5 mg Oral QHS  . memantine  10 mg Oral BID  . mirabegron ER  50 mg Oral Q supper  . montelukast  10 mg Oral Daily  . multivitamin with minerals  1 tablet Oral Daily  . pantoprazole  40 mg Oral BID  . predniSONE  7.5 mg Oral QAC breakfast  . sodium chloride flush  3 mL Intravenous Q12H   Continuous Infusions: . sodium chloride    . ferumoxytol 510 mg (11/09/19 1344)     LOS: 5 days    Time spent: 25 minutes    Edwin Dada, MD [facility] 11/11/2019, 6:07 PM     Please page though [place] or Epic secure chat:  For [employer], Adult nurse

## 2019-11-11 NOTE — Care Management Important Message (Signed)
Important Message  Patient Details  Name: [name] MRN: [id number] Date of Birth: March 18, 1929   Medicare Important Message Given:  Yes     [name] 11/11/2019, 9:33 AM

## 2019-11-11 NOTE — Progress Notes (Signed)
Subjective: 1 Day Post-Op s/p Procedure(s): IRRIGATION AND DEBRIDEMENT EXTREMITY, left olecranon bursa  Overall doing well this morning. Reports no pain in left elbow, but does have swelling in left hand. Nursing staff also noted some erythema at his right medial elbow. Denies fevers, chills, abdominal pain, nausea, or vomiting.    Objective:  PE: VITALS:   Vitals:   11/10/19 2320 11/11/19 0252 11/11/19 0731 11/11/19 0748  BP: (!) 100/52 (!) 157/57  (!) 135/59  Pulse: (!) 52 (!) 50 (!) 51 (!) 52  Resp: 12 13 12 14   Temp: (!) 97.4 F (36.3 C)   (!) 95.8 F (35.4 C)  TempSrc: Axillary   Axillary  SpO2: 98% 99% 96% 98%  Weight:  83.1 kg    Height:       General: laying comfortably in bed, in no acute distress Resp: No use of accessory musculature LUE - surgical dressings in place. Significant edema of left hand though full ROM of all fingers and able to make a fist. + radial pulse. Distal sensation intact. RUE - erythema noted at right medial elbow. No edema this morning. No TTP to right elbow.   LABS  Results for orders placed or performed during the hospital encounter of 11/06/19 (from the past 24 hour(s))  Glucose, capillary     Status: Abnormal   Collection Time: 11/10/19 10:17 AM  Result Value Ref Range   Glucose-Capillary 123 (H) 70 - 99 mg/dL  Glucose, capillary     Status: Abnormal   Collection Time: 11/10/19 12:26 PM  Result Value Ref Range   Glucose-Capillary 123 (H) 70 - 99 mg/dL  Glucose, capillary     Status: Abnormal   Collection Time: 11/10/19 12:58 PM  Result Value Ref Range   Glucose-Capillary 132 (H) 70 - 99 mg/dL  Glucose, capillary     Status: Abnormal   Collection Time: 11/10/19  4:44 PM  Result Value Ref Range   Glucose-Capillary 126 (H) 70 - 99 mg/dL  Glucose, capillary     Status: Abnormal   Collection Time: 11/10/19  8:53 PM  Result Value Ref Range   Glucose-Capillary 118 (H) 70 - 99 mg/dL  Renal function panel     Status: Abnormal    Collection Time: 11/11/19  2:42 AM  Result Value Ref Range   Sodium 140 135 - 145 mmol/L   Potassium 4.3 3.5 - 5.1 mmol/L   Chloride 105 98 - 111 mmol/L   CO2 25 22 - 32 mmol/L   Glucose, Bld 111 (H) 70 - 99 mg/dL   BUN 39 (H) 8 - 23 mg/dL   Creatinine, Ser 2.60 (H) 0.61 - 1.24 mg/dL   Calcium 8.3 (L) 8.9 - 10.3 mg/dL   Phosphorus 4.6 2.5 - 4.6 mg/dL   Albumin 2.0 (L) 3.5 - 5.0 g/dL   GFR calc non Af Amer 21 (L) >60 mL/min   GFR calc Af Amer 24 (L) >60 mL/min   Anion gap 10 5 - 15  CBC     Status: Abnormal   Collection Time: 11/11/19  2:42 AM  Result Value Ref Range   WBC 11.6 (H) 4.0 - 10.5 K/uL   RBC 3.06 (L) 4.22 - 5.81 MIL/uL   Hemoglobin 7.5 (L) 13.0 - 17.0 g/dL   HCT 24.3 (L) 39 - 52 %   MCV 79.4 (L) 80.0 - 100.0 fL   MCH 24.5 (L) 26.0 - 34.0 pg   MCHC 30.9 30.0 - 36.0 g/dL   RDW  17.3 (H) 11.5 - 15.5 %   Platelets 202 150 - 400 K/uL   nRBC 0.3 (H) 0.0 - 0.2 %  Glucose, capillary     Status: None   Collection Time: 11/11/19  6:26 AM  Result Value Ref Range   Glucose-Capillary 91 70 - 99 mg/dL    No results found.  Assessment/Plan: Principal Problem:   Acute renal failure (HCC) Active Problems:   OSA (obstructive sleep apnea)   COPD (chronic obstructive pulmonary disease) (HCC)   Iatrogenic adrenal insufficiency (HCC)   Acute on chronic diastolic CHF (congestive heart failure) (HCC)   CHF (congestive heart failure) (HCC)   Hyperkalemia  Right medial elbow erythema: - will continue to watch clinically, no surgical intervention needed at this time  Left septic olecranon bursitis: 1 Day Post-Op s/p Procedure(s): IRRIGATION AND DEBRIDEMENT EXTREMITY, left olecranon bursa Insicional and dressing care: continue with daily dressing changes. Dressing changed this evening and removed ace wrap to decrease hand swelling.  Orthopedic device(s): Can use sling for comfort.  VTE prophylaxis: Heparin ID: rocephin  Pain control: continue current regimen  Ortho will sign  off, patient to follow-up as outpatient with Dr. Percell Miller 1 week after discharge.  Contact information:   Weekdays 8-5 [address], [place] [phone] A fter hours and holidays please check [email or web address] for group call information for [facility]  Ventura Bruns 11/11/2019, 7:51 AM

## 2019-11-11 NOTE — Progress Notes (Signed)
Physical Therapy Treatment Patient Details Name: [name] MRN: [id number] DOB: 1928/11/08 Today's Date: 11/11/2019    History of Present Illness Pt is an 86 yo male presenting with increased SOB, workup revealed acute on chronic CHF decompensation with hyperkalemia, UTI, and AKI. Patient also found to have septic olecranon bursitis in L elbow s/p I&D on 7/11 by Dr. Percell Miller. PMH includes: dementia, CAD s/p stent placement, COPD, DM, HTN, and GERD.    PT Comments    Pt was seen for mobility and noted sign change for lesser distance with gait on hemiwalker and sling.  Pt is unable to fully follow instructions to use the device and will need more help to successfully resume gait as before.  Follow up with all precautions, but have learned he may use LUE on RW as needed.  Instruct pt to use care to limit LUE wb for comfort.     Follow Up Recommendations  SNF     Equipment Recommendations  Rolling walker with 5" wheels    Recommendations for Other Services OT consult     Precautions / Restrictions Precautions Precautions: Fall Precaution Comments: NWB on LUE Required Braces or Orthoses: Sling;Splint/Cast Splint/Cast: L arm posterior long arm splint, shoulder immobilizer Restrictions Weight Bearing Restrictions: Yes LUE Weight Bearing: Weight bearing as tolerated Other Position/Activity Restrictions: may use walker    Mobility  Bed Mobility Overal bed mobility: Needs Assistance Bed Mobility: Supine to Sit     Supine to sit: Mod assist     General bed mobility comments: HOB is mildly elevated   Transfers Overall transfer level: Needs assistance Equipment used: Hemi-walker;1 person hand held assist Transfers: Sit to/from Stand Sit to Stand: Mod assist Stand pivot transfers: Mod assist       General transfer comment: mod assist with dense cues for hand placement to stand  Ambulation/Gait Ambulation/Gait assistance: Mod assist;Max assist Gait Distance (Feet): 20  Feet Assistive device: 1 person hand held assist Gait Pattern/deviations: Step-to pattern;Decreased stride length;Narrow base of support (actively trying to use LUE to stand and walk on hemiwalker) Gait velocity: decreased   General Gait Details: shuffling and heavy lean on R side due to sling   Stairs             Wheelchair Mobility    Modified Rankin (Stroke Patients Only)       Balance Overall balance assessment: Needs assistance Sitting-balance support: Feet supported Sitting balance-Leahy Scale: Fair Sitting balance - Comments: Able to maintain sitting balance at EOB with LUE HEP.  Postural control: Posterior lean Standing balance support: Single extremity supported Standing balance-Leahy Scale: Poor Standing balance comment: Heavy reliance on BUE on RW.                             Cognition Arousal/Alertness: Awake/alert Behavior During Therapy: Impulsive Overall Cognitive Status: History of cognitive impairments - at baseline Area of Impairment: Problem solving;Awareness;Safety/judgement;Following commands;Memory;Orientation;Attention                 Orientation Level: Situation Current Attention Level: Selective Memory: Decreased recall of precautions;Decreased short-term memory Following Commands: Follows one step commands inconsistently;Follows one step commands with increased time Safety/Judgement: Decreased awareness of deficits;Decreased awareness of safety Awareness: Intellectual Problem Solving: Slow processing;Difficulty sequencing;Requires verbal cues;Requires tactile cues General Comments: pt was unable to follow instructions for safety with LUE      Exercises General Exercises - Upper Extremity Shoulder Flexion: AROM;20 reps;Seated Shoulder Extension: AROM;20  reps;Seated Elbow Flexion: AROM;20 reps;Seated Elbow Extension: AROM;20 reps;Seated Wrist Flexion: AROM;20 reps;Seated Wrist Extension: AROM;20 reps;Seated     General Comments General comments (skin integrity, edema, etc.): signficant support today on hemiwalker, pt unable to follow cues to advance it in sling      Pertinent Vitals/Pain Pain Assessment: No/denies pain    Home Living                      Prior Function            PT Goals (current goals can now be found in the care plan section) Acute Rehab PT Goals Patient Stated Goal: To get stronger.     Frequency    Min 3X/week      PT Plan Discharge plan needs to be updated    Co-evaluation              AM-PAC PT "6 Clicks" Mobility   Outcome Measure  Help needed turning from your back to your side while in a flat bed without using bedrails?: A Little Help needed moving from lying on your back to sitting on the side of a flat bed without using bedrails?: A Little Help needed moving to and from a bed to a chair (including a wheelchair)?: A Little Help needed standing up from a chair using your arms (e.g., wheelchair or bedside chair)?: A Lot Help needed to walk in hospital room?: A Lot Help needed climbing 3-5 steps with a railing? : Total 6 Click Score: 14    End of Session Equipment Utilized During Treatment: Gait belt;Other (comment) (sling) Activity Tolerance: Treatment limited secondary to medical complications (Comment) Patient left: in bed;with call bell/phone within reach;with bed alarm set;with family/visitor present Nurse Communication: Mobility status PT Visit Diagnosis: Repeated falls (R29.6);History of falling (Z91.81);Difficulty in walking, not elsewhere classified (R26.2)     Time: 7482-7078 PT Time Calculation (min) (ACUTE ONLY): 28 min  Charges:  $Gait Training: 8-22 mins $Therapeutic Activity: 8-22 mins                  Ramond Dial 11/11/2019, 2:53 PM  Mee Hives, PT MS Acute Rehab Dept. Number: [place] [facility]

## 2019-11-11 NOTE — Progress Notes (Signed)
Inpatient Diabetes Program Recommendations  AACE/ADA: New Consensus Statement on Inpatient Glycemic Control (2015)  Target Ranges:  Prepandial:   less than 140 mg/dL      Peak postprandial:   less than 180 mg/dL (1-2 hours)      Critically ill patients:  140 - 180 mg/dL   Lab Results  Component Value Date   GLUCAP 114 (H) 11/11/2019   HGBA1C 9.4 (H) 11/07/2019    Review of Glycemic Control Results for BRAND, SIEVER (MRN [id number]) as of 11/11/2019 13:03  Ref. Range 11/10/2019 12:58 11/10/2019 16:44 11/10/2019 20:53 11/11/2019 06:26 11/11/2019 11:21  Glucose-Capillary Latest Ref Range: 70 - 99 mg/dL 132 (H) 126 (H) 118 (H) 91 114 (H)   Inpatient Diabetes Program Recommendations:    Please consider decrease in Novolog correction to sensitive tid.  Thank you, Nani Gasser. Eastyn Skalla, RN, MSN, CDE  Diabetes Coordinator Inpatient Glycemic Control Team Team Pager [phone] (8am-5pm) 11/11/2019 1:03 PM

## 2019-11-12 LAB — CBC
HCT: 23.9 % — ABNORMAL LOW (ref 39.0–52.0)
Hemoglobin: 7.5 g/dL — ABNORMAL LOW (ref 13.0–17.0)
MCH: 24.4 pg — ABNORMAL LOW (ref 26.0–34.0)
MCHC: 31.4 g/dL (ref 30.0–36.0)
MCV: 77.9 fL — ABNORMAL LOW (ref 80.0–100.0)
Platelets: 225 10*3/uL (ref 150–400)
RBC: 3.07 MIL/uL — ABNORMAL LOW (ref 4.22–5.81)
RDW: 17.4 % — ABNORMAL HIGH (ref 11.5–15.5)
WBC: 13.6 10*3/uL — ABNORMAL HIGH (ref 4.0–10.5)
nRBC: 0.5 % — ABNORMAL HIGH (ref 0.0–0.2)

## 2019-11-12 LAB — CULTURE, BLOOD (ROUTINE X 2)
Culture: NO GROWTH
Culture: NO GROWTH
Special Requests: ADEQUATE
Special Requests: ADEQUATE

## 2019-11-12 LAB — RENAL FUNCTION PANEL
Albumin: 2.2 g/dL — ABNORMAL LOW (ref 3.5–5.0)
Anion gap: 10 (ref 5–15)
BUN: 47 mg/dL — ABNORMAL HIGH (ref 8–23)
CO2: 25 mmol/L (ref 22–32)
Calcium: 8.5 mg/dL — ABNORMAL LOW (ref 8.9–10.3)
Chloride: 103 mmol/L (ref 98–111)
Creatinine, Ser: 2.42 mg/dL — ABNORMAL HIGH (ref 0.61–1.24)
GFR calc Af Amer: 27 mL/min — ABNORMAL LOW (ref >60)
GFR calc non Af Amer: 23 mL/min — ABNORMAL LOW (ref >60)
Glucose, Bld: 189 mg/dL — ABNORMAL HIGH (ref 70–99)
Phosphorus: 3.2 mg/dL (ref 2.5–4.6)
Potassium: 4.6 mmol/L (ref 3.5–5.1)
Sodium: 138 mmol/L (ref 135–145)

## 2019-11-12 LAB — GLUCOSE, CAPILLARY
Glucose-Capillary: 157 mg/dL — ABNORMAL HIGH (ref 70–99)
Glucose-Capillary: 199 mg/dL — ABNORMAL HIGH (ref 70–99)
Glucose-Capillary: 269 mg/dL — ABNORMAL HIGH (ref 70–99)

## 2019-11-12 MED ORDER — SODIUM CHLORIDE 0.9 % IV SOLN
510.0000 mg | Freq: Once | INTRAVENOUS | Status: AC
  Administered 2019-11-13: 510 mg via INTRAVENOUS
  Filled 2019-11-12 (×2): qty 17

## 2019-11-12 MED ORDER — FUROSEMIDE 40 MG PO TABS
40.0000 mg | ORAL_TABLET | Freq: Every day | ORAL | Status: DC
  Administered 2019-11-12 – 2019-11-13 (×2): 40 mg via ORAL
  Filled 2019-11-12 (×2): qty 1

## 2019-11-12 MED ORDER — INSULIN ASPART 100 UNIT/ML ~~LOC~~ SOLN
2.0000 [IU] | Freq: Three times a day (TID) | SUBCUTANEOUS | Status: DC
  Administered 2019-11-12 – 2019-11-13 (×5): 2 [IU] via SUBCUTANEOUS

## 2019-11-12 MED ORDER — AMLODIPINE BESYLATE 10 MG PO TABS
10.0000 mg | ORAL_TABLET | Freq: Every day | ORAL | Status: DC
  Administered 2019-11-13: 10 mg via ORAL
  Filled 2019-11-12: qty 1

## 2019-11-12 NOTE — Progress Notes (Signed)
Inpatient Diabetes Program Recommendations  AACE/ADA: New Consensus Statement on Inpatient Glycemic Control (2015)  Target Ranges:  Prepandial:   less than 140 mg/dL      Peak postprandial:   less than 180 mg/dL (1-2 hours)      Critically ill patients:  140 - 180 mg/dL   Lab Results  Component Value Date   GLUCAP 157 (H) 11/12/2019   HGBA1C 9.4 (H) 11/07/2019    Review of Glycemic Control Results for COURAGE, BIGLOW (MRN [id number]) as of 11/12/2019 08:15  Ref. Range 11/11/2019 06:26 11/11/2019 11:21 11/11/2019 17:27 11/11/2019 22:58 11/12/2019 06:16  Glucose-Capillary Latest Ref Range: 70 - 99 mg/dL 91 114 (H) 280 (H) 219 (H) 157 (H)   Diabetes history: DM2 Outpatient Diabetes medications: Janumet 50-1000 mg bid, Humalog 2-5 units tid meal coverage Current orders for Inpatient glycemic control: Tradjenta 5 mg + Novolog sensitive correction tid  Inpatient Diabetes Program Recommendations:   Consider if postprandials continue elevated >200: -Novolog 2 units tid meal coverage if eats 50%  Thank you, Bethena Roys E. Fiorella Hanahan, RN, MSN, CDE  Diabetes Coordinator Inpatient Glycemic Control Team Team Pager [phone] (8am-5pm) 11/12/2019 8:18 AM

## 2019-11-12 NOTE — Progress Notes (Signed)
Patient ID: [name], male   DOB: 24-Oct-1928, 86 y.o.   MRN: [id number] S:  No new issues. Wife at bedside.    O:BP 139/63 (BP Location: Right Arm)   Pulse 62   Temp 97.6 F (36.4 C) (Oral)   Resp 18   Ht 5\' 7"  (1.702 m)   Wt 80.7 kg   SpO2 100%   BMI 27.86 kg/m   Intake/Output Summary (Last 24 hours) at 11/12/2019 2130 Last data filed at 11/11/2019 1722 Gross per 24 hour  Intake 240 ml  Output 800 ml  Net -560 ml   Intake/Output: Yesterday was 280mL/ 863mL Gen: NAD, resting comfortably CVS: no rub Resp: normal WOB at rest, just a few exp wheezes BL  Abd: benign Ext: trace dependent thigh. LUE in ace wrap s/p OR.  GU: condom cath draining clear yellow urine  Recent Labs  Lab 11/06/19 1126 11/06/19 1420 11/07/19 0241 11/07/19 0245 11/08/19 0306 11/09/19 0304 11/10/19 0340 11/11/19 0242 11/12/19 0323  NA 137   < > 139 140 138 139 141 140 138  K 6.2*   < > 5.1 5.1 4.4 4.0 3.7 4.3 4.6  CL 106   < > 105 103 101 103 104 105 103  CO2 19*  --  23  --  25 22 25 25 25   GLUCOSE 190*   < > 123* 119* 205* 191* 161* 111* 189*  BUN 40*   < > 36* 37* 45* 48* 44* 39* 47*  CREATININE 2.32*   < > 2.52* 2.70* 3.37* 3.09* 2.79* 2.60* 2.42*  ALBUMIN  --   --   --   --   --  2.3* 2.5* 2.0* 2.2*  CALCIUM 9.0  --  9.2  --  8.6* 8.3* 8.6* 8.3* 8.5*  PHOS  --   --   --   --   --  6.2* 4.9* 4.6 3.2   < > = values in this interval not displayed.   Liver Function Tests: Recent Labs  Lab 11/10/19 0340 11/11/19 0242 11/12/19 0323  ALBUMIN 2.5* 2.0* 2.2*   No results for input(s): LIPASE, AMYLASE in the last 168 hours. No results for input(s): AMMONIA in the last 168 hours. CBC: Recent Labs  Lab 11/08/19 0306 11/08/19 0306 11/09/19 0304 11/09/19 0304 11/10/19 0340 11/11/19 0242 11/12/19 0323  WBC 12.7*   < > 12.9*   < > 12.9* 11.6* 13.6*  HGB 8.2*   < > 8.5*   < > 8.7* 7.5* 7.5*  HCT 26.5*   < > 27.1*   < > 28.0* 24.3* 23.9*  MCV 77.5*  --  78.1*  --  78.0* 79.4* 77.9*   PLT 194   < > 220   < > 217 202 225   < > = values in this interval not displayed.   Cardiac Enzymes: No results for input(s): CKTOTAL, CKMB, CKMBINDEX, TROPONINI in the last 168 hours. CBG: Recent Labs  Lab 11/11/19 0626 11/11/19 1121 11/11/19 1727 11/11/19 2258 11/12/19 0616  GLUCAP 91 114* 280* 219* 157*    Iron Studies: No results for input(s): IRON, TIBC, TRANSFERRIN, FERRITIN in the last 72 hours. Studies/Results: No results found. Marland Kitchen amLODipine  5 mg Oral Daily  . aspirin EC  81 mg Oral Q supper  . brimonidine  1 drop Left Eye BID  . budesonide (PULMICORT) nebulizer solution  0.25 mg Nebulization BID  . cephALEXin  500 mg Oral Q8H  . cetirizine  10 mg Oral Daily  .  cholecalciferol  2,000 Units Oral Q supper  . docusate sodium  100 mg Oral BID  . escitalopram  5 mg Oral Q supper  . finasteride  5 mg Oral Q supper  . furosemide  40 mg Intravenous Daily  . heparin injection (subcutaneous)  5,000 Units Subcutaneous Q12H  . insulin aspart  0-9 Units Subcutaneous TID WC  . ipratropium-albuterol  3 mL Nebulization BID  . latanoprost  1 drop Both Eyes QHS  . linagliptin  5 mg Oral Daily  . melatonin  5 mg Oral QHS  . memantine  10 mg Oral BID  . mirabegron ER  50 mg Oral Q supper  . montelukast  10 mg Oral Daily  . multivitamin with minerals  1 tablet Oral Daily  . pantoprazole  40 mg Oral BID  . predniSONE  7.5 mg Oral QAC breakfast  . sodium chloride flush  3 mL Intravenous Q12H    BMET    Component Value Date/Time   NA 138 11/12/2019 0323   K 4.6 11/12/2019 0323   CL 103 11/12/2019 0323   CO2 25 11/12/2019 0323   GLUCOSE 189 (H) 11/12/2019 0323   BUN 47 (H) 11/12/2019 0323   CREATININE 2.42 (H) 11/12/2019 0323   CALCIUM 8.5 (L) 11/12/2019 0323   GFRNONAA 23 (L) 11/12/2019 0323   GFRAA 27 (L) 11/12/2019 0323   CBC    Component Value Date/Time   WBC 13.6 (H) 11/12/2019 0323   RBC 3.07 (L) 11/12/2019 0323   HGB 7.5 (L) 11/12/2019 0323   HCT 23.9 (L)  11/12/2019 0323   PLT 225 11/12/2019 0323   MCV 77.9 (L) 11/12/2019 0323   MCH 24.4 (L) 11/12/2019 0323   MCHC 31.4 11/12/2019 0323   RDW 17.4 (H) 11/12/2019 0323   LYMPHSABS 1.2 05/07/2017 1652   MONOABS 0.8 05/07/2017 1652   EOSABS 1.0 (H) 05/07/2017 1652   BASOSABS 0.0 05/07/2017 1652    Assessment/Plan: 1. Non-oliguric AKI/CKD stage IIIa- in setting of acute on chronic diastolic CHF and IV diuresis as well as septic olecranon bursitis. He did receive IV vanco 1 gram on 11/07/19. Now on rocephin. Renal US without obstruction and c/w chronic medical renal disease. Likely due cardiorenal syndrome + ischemic ATN. Thankfully renal function improving daily. 1. D/C IV lasix and transition to lasix 40 po daily, 1st dose this AM 2. Renal dose meds 3. Avoid IV contrast and nephrotoxic agents. 2. Acute on chronic diastolic CHF- now grade II DD on ECHO. Responded to IV lasix. Transition to po lasix per above. On low na diet.   3. Septic left olecranon bursitis with cellulitis- +staph aureus on gram stain. Given one dose of vanco and now on rocephin. Ortho following and s/p OR 7/11. 4. Hyperkalemia- improved with lokelma and IV lasix 5. Renal mass- right kidney, soft tissue mass concerning for RCC. Urology has been consulted --> slow growing and nonmetastatic and unlikely to affect life expectancy.  Rec observation.   6. DM- per primary 7. COPD- improved with bronchodilators. On chronic prednisone 8. OSA on CPAP qhs 9. Anemia of CKD and iron deficiency. TSAT 13%, ferritin 60, iron 38. IV feraheme - s/p 1 dose 7/10, will give 2nd dose tomorrow.  I favor seeing his response to iron load prior to initiating ESA given suspected renal malignancy.   10. HTN- stable 11. Dementia- stable and family at bedside. 12. CAD- stable 13. Disposition- pt is DNR.Awaiting SNF placement.  Spoke to son [name] yesterday --> ph  number [phone].  I'm happy to update anytime.   Jannifer Hick  MD [facility] Pager [phone]

## 2019-11-12 NOTE — Progress Notes (Signed)
Pt will wear CPAP at bedtime.

## 2019-11-12 NOTE — Progress Notes (Signed)
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  Of note, patient had recently been seen by orthopedics for arm swelling, x-rays showed no fracture, thought to have cellulitis.  In the ER, CXR showed edema and small effusion, K 6.2, Cr 2.3, Bicarb 19.  ECG with sinus bradycardia.  Also, lactate >3 and WBC 14K with UA suggesting UTI.  Cardiology and Pulmonology were consulted.  Patient started on IV Lasix for CHF, IV Rocephin for UTI.         Assessment & Plan:  Acute diastolic CHF Initial chief complaint of dyspnea, SOB, swelling; CXR with interstitial edema and BNP 477.  Admitted and started on IV Lasix  Echo showed normal EF but new grade 2 diastolic dysfunction, normal valves.  Initial creatinine worsening with aggressive diuresis, this was slowed and has had good UOP with steadily improving Cr.  Net negative 500cc again, discussed with Nephrology, will transition to oral Lasix -Oral Lasix -If urine output net even to negative, and creatinine stable to improving, likely home tomorrow with oral Lasix and outpatient follow-up    Acute renal failure on CKD stage IIIa Baseline creatinine 1.2.  On admission creatinine 2.3, unclear chronicity.    UA showed pyuria, no RBCs.  FeNA 1.7%, intrinsic.  Renal imaging noted a 4 cm kidney mass, but no hydronephrosis.  No NSAID exposure, had been off ARB for some time per family.  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  Renal function worsened, neurology were consulted, diuretics were held, and creatinine  started to improve.  -Stop ARB at discharge -Transition to oral Lasix, if creatinine stable to improving, discharge to SNF when bed available -Consult nephrology, appreciate recommendations     Cellulitis of the left arm with olecranon bursitis S/p olecranon bursa I&D/washout by Dr. Percell Miller on 7/11 Swelling developed in the left forearm several days prior to admission.  Olecranon bursa aspirated by orthopedics, MSSA in culture.  Sepsis was ruled out.  Patient underwent I&D 2/99, uncomplicated, purulent fluid expressed from the bursa and bursectomy performed.  Discussed with ID, Dr. Linus Salmons By phone, recommended 14 days of antibiotics from the point of bursectomy. -Continue Keflex, day 2 of 14 -Patient will need follow-up with [facility] orthopedics for suture removal by 7/18     Microcytic anemia Ferritin low normal, TIBC, iron sat and sat ratio all low.  No clear history GI blood loss.  RI low/hypoproliferative.  Feraheme given 7/10  Hemoglobin stable today.  Nephrology will give Feraheme second dose tomorrow. -Monitor hemoglobin -If no response to iron after discharge, nephrology will consider ESA at a later time -Hold home Plavix given anemia     Renal mass 4.8 x 4.3 cm soft tissue mass noted incidentally in the RIGHT kidney on admission imaging.  Urology were consulted who noted slow growth since imaging 10 years ago, suggesting very slow growth kinetics, unlikely to impact life expectancy, likely more harm than benefit with nephrectomy.  Family in agreement.  COPD without exacerbation OSA -Continue montelukast, Zyrtec, duo nebs -Continue Pulmicort -Continue  CPAP at night -Hold home Trelegy -Continue home prednisone at chronic 7.5 daily dose    Vascular disease, secondary prevention Hypertension Blood pressure slightly elevated -Continue amlodipine -Hold telmisartan due to ARF -Hold metoprolol due to bradycardia -Continue low-dose aspirin -Hold Plavix given  anemia    Bradycardia -Hold nebivolol, donepezil  Diabetes A1c 9.4%. Glucose normal -Continue linagliptin -Hold Metformin -Continue sliding scale corrections   Dementia with superimposed acute metabolic encephalopathy due to renal failure, infection Encephalopathy resolving -Continue memantine, escitalopram -Resume donepezil at discharge -Mobilize, PT/OT  BPH LUTS -Continue mirabegron and finasteride  GERD -Continue PPI      Hyperkalemia Acute metabolic acidosis Potassium and acidosis are resolved with diuresis.          Disposition: Status is: Inpatient  Remains inpatient appropriate because: his infection and renal failure have left him very weak and debilitated from his baseline independent level of function and ambulatory.  At present he is weak and requires two person assist to transfer to chair.  Likely will need a cautious rehabilitation and therapy regimen to attain his prior level of function   Dispo: The patient is from: Home              Anticipated d/c is to: SNF              Anticipated d/c date is: 1 day             Patient currently is not medically stable to d/c.   Lasix PO today, and if Cr stable to improving and urine output even to net negative, will transition to SNF when bed available          MDM: The below labs and imaging reports reviewed and summarized above.  Medication management as above.    DVT prophylaxis: SCDs Start: 11/10/19 1307 heparin injection 5,000 Units Start: 11/06/19 1530  Code Status: DNR Family Communication: son by phone    Consultants:   Cardiology  Pulmonology  Orthopedics  Nephrology  Procedures:   7/7 renal ultrasound --no hydronephrosis  7/7 CT abdomen and pelvis --renal mass noted, no other abnormalities  7/11 olecranon bursa I&D, bursectomy  Antimicrobials:   Ceftriaxone 7/7 >>  Vancomycin 1 g x 1 7/8  Doxycycline x1 7/8   Culture data:   7/8 urine culture  pending  7/8 blood culture x2 no growth   7/8 olecranon bursa aspiration-MSSA          Subjective: No new fever, respiratory distress, vomiting, confusion.  His mentation is improving, and he is slightly more physically active, although still requiring assistance to get out of bed.  His left arm swelling is improved      Objective: Vitals:   11/12/19 1100 11/12/19 1606 11/12/19 2042 11/12/19 2104  BP: (!) 125/52 (!) 147/62 (!) 154/69   Pulse: (!) 57 (!) 56 61   Resp: 20 20    Temp: 97.9 F (36.6 C) 97.6 F (36.4 C) 97.8 F (36.6 C)   TempSrc: Oral Oral Oral   SpO2: 100% 100% 100% 100%  Weight:      Height:        Intake/Output Summary (Last 24 hours) at 11/12/2019 2128 Last data filed at 11/12/2019 1607 Gross per 24 hour  Intake 480 ml  Output 650 ml  Net -170 ml   Filed Weights   11/10/19 0409 11/11/19 0252 11/12/19 0531  Weight: 80.4 kg 83.1 kg 80.7 kg    Examination: General appearance: Elderly adult male, lying in bed,  interactive but sleepy.    HEENT: Anicteric, conjunctival pink, lids and lashes appear normal for age.   Skin: Swelling in the left arm has resolved.  There is a small amount of fluctuance and fluid at the site of his I&D, but this is nontender nonfluctuant nor hard nor painful. Cardiac: RRR, no murmurs, trace lower extremity edema only Respiratory: Respiratory effort normal, lungs clear without rales or wheezes Abdomen: Abdomen without tenderness palpation, distention, or ascites.  No rigidity or rebound. MSK:  Neuro: Makes eye contact, extraocular movements intact, moves upper extremities with generalized weakness, but symmetric strength.  Speech fluent. Psych: Psychomotor slowing noted, but attention to me is normal.  Affect blunted, judgment insight appear moderately impaired by dementia      Data Reviewed: I have personally reviewed following labs and imaging studies:  CBC: Recent Labs  Lab 11/08/19 0306 11/09/19 0304  11/10/19 0340 11/11/19 0242 11/12/19 0323  WBC 12.7* 12.9* 12.9* 11.6* 13.6*  HGB 8.2* 8.5* 8.7* 7.5* 7.5*  HCT 26.5* 27.1* 28.0* 24.3* 23.9*  MCV 77.5* 78.1* 78.0* 79.4* 77.9*  PLT 194 220 217 202 981   Basic Metabolic Panel: Recent Labs  Lab 11/08/19 0306 11/09/19 0304 11/10/19 0340 11/11/19 0242 11/12/19 0323  NA 138 139 141 140 138  K 4.4 4.0 3.7 4.3 4.6  CL 101 103 104 105 103  CO2 25 22 25 25 25   GLUCOSE 205* 191* 161* 111* 189*  BUN 45* 48* 44* 39* 47*  CREATININE 3.37* 3.09* 2.79* 2.60* 2.42*  CALCIUM 8.6* 8.3* 8.6* 8.3* 8.5*  PHOS  --  6.2* 4.9* 4.6 3.2   GFR: Estimated Creatinine Clearance: 21.5 mL/min (A) (by C-G formula based on SCr of 2.42 mg/dL (H)). Liver Function Tests: Recent Labs  Lab 11/09/19 0304 11/10/19 0340 11/11/19 0242 11/12/19 0323  ALBUMIN 2.3* 2.5* 2.0* 2.2*   No results for input(s): LIPASE, AMYLASE in the last 168 hours. No results for input(s): AMMONIA in the last 168 hours. Coagulation Profile: No results for input(s): INR, PROTIME in the last 168 hours. Cardiac Enzymes: No results for input(s): CKTOTAL, CKMB, CKMBINDEX, TROPONINI in the last 168 hours. BNP (last 3 results) No results for input(s): PROBNP in the last 8760 hours. HbA1C: No results for input(s): HGBA1C in the last 72 hours. CBG: Recent Labs  Lab 11/11/19 1727 11/11/19 2258 11/12/19 0616 11/12/19 1056 11/12/19 1722  GLUCAP 280* 219* 157* 199* 269*   Lipid Profile: No results for input(s): CHOL, HDL, LDLCALC, TRIG, CHOLHDL, LDLDIRECT in the last 72 hours. Thyroid Function Tests: No results for input(s): TSH, T4TOTAL, FREET4, T3FREE, THYROIDAB in the last 72 hours. Anemia Panel: No results for input(s): VITAMINB12, FOLATE, FERRITIN, TIBC, IRON, RETICCTPCT in the last 72 hours. Urine analysis:    Component Value Date/Time   COLORURINE YELLOW 11/08/2019 1633   APPEARANCEUR HAZY (A) 11/08/2019 1633   LABSPEC 1.014 11/08/2019 1633   PHURINE 5.0 11/08/2019  1633   GLUCOSEU NEGATIVE 11/08/2019 1633   HGBUR NEGATIVE 11/08/2019 1633   BILIRUBINUR NEGATIVE 11/08/2019 1633   KETONESUR NEGATIVE 11/08/2019 1633   PROTEINUR NEGATIVE 11/08/2019 1633   UROBILINOGEN 0.2 09/24/2014 1442   NITRITE NEGATIVE 11/08/2019 1633   LEUKOCYTESUR LARGE (A) 11/08/2019 1633   Sepsis Labs: @LABRCNTIP (procalcitonin:4,lacticacidven:4)  ) Recent Results (from the past 240 hour(s))  SARS Coronavirus 2 by RT PCR (hospital order, performed in [facility] hospital lab) Nasopharyngeal Nasopharyngeal Swab     Status: None   Collection Time: 11/06/19  2:34 PM   Specimen:  Nasopharyngeal Swab  Result Value Ref Range Status   SARS Coronavirus 2 NEGATIVE NEGATIVE Final    Comment: (NOTE) SARS-CoV-2 target nucleic acids are NOT DETECTED.  The SARS-CoV-2 RNA is generally detectable in upper and lower respiratory specimens during the acute phase of infection. The lowest concentration of SARS-CoV-2 viral copies this assay can detect is 250 copies / mL. A negative result does not preclude SARS-CoV-2 infection and should not be used as the sole basis for treatment or other patient management decisions.  A negative result may occur with improper specimen collection / handling, submission of specimen other than nasopharyngeal swab, presence of viral mutation(s) within the areas targeted by this assay, and inadequate number of viral copies (<250 copies / mL). A negative result must be combined with clinical observations, patient history, and epidemiological information.  Fact Sheet for Patients:   StrictlyIdeas.no  Fact Sheet for Healthcare Providers: BankingDealers.co.za  This test is not yet approved or  cleared by the [place] FDA and has been authorized for detection and/or diagnosis of SARS-CoV-2 by FDA under an Emergency Use Authorization (EUA).  This EUA will remain in effect (meaning this test can be used) for the  duration of the COVID-19 declaration under Section 564(b)(1) of the Act, 21 U.S.C. section 360bbb-3(b)(1), unless the authorization is terminated or revoked sooner.  Performed at [facility], [place] [address] [place], [place] [address]   Body fluid culture     Status: None   Collection Time: 11/07/19  1:48 PM   Specimen: Fluid  Result Value Ref Range Status   Specimen Description FLUID SYNOVIAL CYSTS  Final   Special Requests NONE  Final   Gram Stain   Final    ABUNDANT WBC PRESENT,BOTH PMN AND MONONUCLEAR RARE GRAM POSITIVE COCCI CRITICAL RESULT CALLED TO, READ BACK BY AND VERIFIED WITH: L HUYNH RN 11/07/19 1724 JDW Performed at [facility], [place] [address], [place], [place] [address]    Culture FEW STAPHYLOCOCCUS AUREUS  Final   Report Status 11/10/2019 FINAL  Final   Organism ID, Bacteria STAPHYLOCOCCUS AUREUS  Final      Susceptibility   Staphylococcus aureus - MIC*    CIPROFLOXACIN <=0.5 SENSITIVE Sensitive     ERYTHROMYCIN <=0.25 SENSITIVE Sensitive     GENTAMICIN <=0.5 SENSITIVE Sensitive     OXACILLIN <=0.25 SENSITIVE Sensitive     TETRACYCLINE <=1 SENSITIVE Sensitive     VANCOMYCIN <=0.5 SENSITIVE Sensitive     TRIMETH/SULFA <=10 SENSITIVE Sensitive     CLINDAMYCIN <=0.25 SENSITIVE Sensitive     RIFAMPIN <=0.5 SENSITIVE Sensitive     Inducible Clindamycin NEGATIVE Sensitive     * FEW STAPHYLOCOCCUS AUREUS  Culture, blood (routine x 2)     Status: None   Collection Time: 11/07/19  7:17 PM   Specimen: BLOOD  Result Value Ref Range Status   Specimen Description BLOOD RIGHT ANTECUBITAL  Final   Special Requests   Final    BOTTLES DRAWN AEROBIC AND ANAEROBIC Blood Culture adequate volume   Culture   Final    NO GROWTH 5 DAYS Performed at [facility], [place] [address], [place], [place] [address]    Report Status 11/12/2019 FINAL  Final  Culture, blood (routine x 2)     Status: None   Collection Time: 11/07/19  7:25 PM   Specimen: BLOOD RIGHT HAND   Result Value Ref Range Status   Specimen Description BLOOD RIGHT HAND  Final   Special Requests  Final    BOTTLES DRAWN AEROBIC ONLY Blood Culture adequate volume   Culture   Final    NO GROWTH 5 DAYS Performed at [facility], [place] [address] [place], [place] [address]    Report Status 11/12/2019 FINAL  Final         Radiology Studies: No results found.      Scheduled Meds: . amLODipine  5 mg Oral Daily  . aspirin EC  81 mg Oral Q supper  . brimonidine  1 drop Left Eye BID  . budesonide (PULMICORT) nebulizer solution  0.25 mg Nebulization BID  . cephALEXin  500 mg Oral Q8H  . cetirizine  10 mg Oral Daily  . cholecalciferol  2,000 Units Oral Q supper  . docusate sodium  100 mg Oral BID  . escitalopram  5 mg Oral Q supper  . finasteride  5 mg Oral Q supper  . furosemide  40 mg Oral Daily  . heparin injection (subcutaneous)  5,000 Units Subcutaneous Q12H  . insulin aspart  0-9 Units Subcutaneous TID WC  . insulin aspart  2 Units Subcutaneous TID WC  . ipratropium-albuterol  3 mL Nebulization BID  . latanoprost  1 drop Both Eyes QHS  . linagliptin  5 mg Oral Daily  . melatonin  5 mg Oral QHS  . memantine  10 mg Oral BID  . mirabegron ER  50 mg Oral Q supper  . montelukast  10 mg Oral Daily  . multivitamin with minerals  1 tablet Oral Daily  . pantoprazole  40 mg Oral BID  . predniSONE  7.5 mg Oral QAC breakfast  . sodium chloride flush  3 mL Intravenous Q12H   Continuous Infusions: . sodium chloride    . [START ON 11/13/2019] ferumoxytol       LOS: 6 days    Time spent: 25 minutes    Edwin Dada, MD [facility] 11/12/2019, 9:28 PM     Please page though [place] or Epic secure chat:  For [employer], Adult nurse

## 2019-11-12 NOTE — Progress Notes (Signed)
Patient on CPAP for night time rest tolerating well.

## 2019-11-12 NOTE — TOC Progression Note (Signed)
Transition of Care [facility]) - Progression Note    Patient Details  Name: LENVILLE HIBBERD MRN: [id number] Date of Birth: 24-Jun-1928  Transition of Care [facility]) CM/SW [place], [place] Phone Number: 11/12/2019, 1:06 PM  Clinical Narrative:     [facility] informed CSW they have to rescind their bed offer- patient's insurance is not in network. CSW gave patient's son other bed offers, he will review and let CSW know SNF choice.  Thurmond Butts, MSW, [place] Clinical Social Worker   Expected Discharge Plan: Skilled Nursing Facility Barriers to Discharge: Continued Medical Work up  Expected Discharge Plan and Services Expected Discharge Plan: [place] In-house Referral: Clinical Social Work     Living arrangements for the past 2 months: Single Family Home                                       Social Determinants of Health (SDOH) Interventions    Readmission Risk Interventions No flowsheet data found.

## 2019-11-12 NOTE — TOC Progression Note (Signed)
Transition of Care [facility]) - Progression Note    Patient Details  Name: MARTHA SOLTYS MRN: [id number] Date of Birth: 1929-03-02  Transition of Care [facility]) CM/SW [place], [place] Phone Number: 11/12/2019, 5:27 PM  Clinical Narrative:     Patient's son has accepted bed offer with [facility]. [facility] has confirmed bed offer.   clinicals was submitted for insurance auth-Insurance auth pending reference # [id number]  MD updated  Thurmond Butts, MSW, [place] Clinical Social Worker   Expected Discharge Plan: [place] Barriers to Discharge: Continued Medical Work up  Expected Discharge Plan and Services Expected Discharge Plan: [place] In-house Referral: Clinical Social Work     Living arrangements for the past 2 months: Single Family Home                                       Social Determinants of Health (SDOH) Interventions    Readmission Risk Interventions No flowsheet data found.

## 2019-11-13 DIAGNOSIS — I5042 Chronic combined systolic (congestive) and diastolic (congestive) heart failure: Secondary | ICD-10-CM | POA: Diagnosis not present

## 2019-11-13 DIAGNOSIS — Z743 Need for continuous supervision: Secondary | ICD-10-CM | POA: Diagnosis not present

## 2019-11-13 DIAGNOSIS — E119 Type 2 diabetes mellitus without complications: Secondary | ICD-10-CM | POA: Diagnosis not present

## 2019-11-13 DIAGNOSIS — N179 Acute kidney failure, unspecified: Secondary | ICD-10-CM | POA: Diagnosis not present

## 2019-11-13 DIAGNOSIS — I25118 Atherosclerotic heart disease of native coronary artery with other forms of angina pectoris: Secondary | ICD-10-CM | POA: Diagnosis not present

## 2019-11-13 DIAGNOSIS — N178 Other acute kidney failure: Secondary | ICD-10-CM | POA: Diagnosis not present

## 2019-11-13 DIAGNOSIS — J9601 Acute respiratory failure with hypoxia: Secondary | ICD-10-CM | POA: Diagnosis not present

## 2019-11-13 DIAGNOSIS — J441 Chronic obstructive pulmonary disease with (acute) exacerbation: Secondary | ICD-10-CM | POA: Diagnosis not present

## 2019-11-13 DIAGNOSIS — I10 Essential (primary) hypertension: Secondary | ICD-10-CM | POA: Diagnosis not present

## 2019-11-13 DIAGNOSIS — N400 Enlarged prostate without lower urinary tract symptoms: Secondary | ICD-10-CM | POA: Diagnosis not present

## 2019-11-13 DIAGNOSIS — R6520 Severe sepsis without septic shock: Secondary | ICD-10-CM | POA: Diagnosis not present

## 2019-11-13 DIAGNOSIS — N183 Chronic kidney disease, stage 3 unspecified: Secondary | ICD-10-CM | POA: Diagnosis not present

## 2019-11-13 DIAGNOSIS — J810 Acute pulmonary edema: Secondary | ICD-10-CM | POA: Diagnosis not present

## 2019-11-13 DIAGNOSIS — J449 Chronic obstructive pulmonary disease, unspecified: Secondary | ICD-10-CM | POA: Diagnosis not present

## 2019-11-13 DIAGNOSIS — I129 Hypertensive chronic kidney disease with stage 1 through stage 4 chronic kidney disease, or unspecified chronic kidney disease: Secondary | ICD-10-CM | POA: Diagnosis not present

## 2019-11-13 DIAGNOSIS — G4733 Obstructive sleep apnea (adult) (pediatric): Secondary | ICD-10-CM | POA: Diagnosis not present

## 2019-11-13 DIAGNOSIS — I509 Heart failure, unspecified: Secondary | ICD-10-CM | POA: Diagnosis not present

## 2019-11-13 DIAGNOSIS — I5033 Acute on chronic diastolic (congestive) heart failure: Secondary | ICD-10-CM | POA: Diagnosis not present

## 2019-11-13 DIAGNOSIS — E875 Hyperkalemia: Secondary | ICD-10-CM | POA: Diagnosis not present

## 2019-11-13 DIAGNOSIS — R279 Unspecified lack of coordination: Secondary | ICD-10-CM | POA: Diagnosis not present

## 2019-11-13 DIAGNOSIS — H409 Unspecified glaucoma: Secondary | ICD-10-CM | POA: Diagnosis not present

## 2019-11-13 DIAGNOSIS — K219 Gastro-esophageal reflux disease without esophagitis: Secondary | ICD-10-CM | POA: Diagnosis not present

## 2019-11-13 DIAGNOSIS — R52 Pain, unspecified: Secondary | ICD-10-CM | POA: Diagnosis not present

## 2019-11-13 DIAGNOSIS — M71122 Other infective bursitis, left elbow: Secondary | ICD-10-CM | POA: Diagnosis not present

## 2019-11-13 DIAGNOSIS — Z03818 Encounter for observation for suspected exposure to other biological agents ruled out: Secondary | ICD-10-CM | POA: Diagnosis not present

## 2019-11-13 DIAGNOSIS — N184 Chronic kidney disease, stage 4 (severe): Secondary | ICD-10-CM | POA: Diagnosis not present

## 2019-11-13 DIAGNOSIS — E2749 Other adrenocortical insufficiency: Secondary | ICD-10-CM

## 2019-11-13 DIAGNOSIS — I2510 Atherosclerotic heart disease of native coronary artery without angina pectoris: Secondary | ICD-10-CM | POA: Diagnosis not present

## 2019-11-13 DIAGNOSIS — F0390 Unspecified dementia without behavioral disturbance: Secondary | ICD-10-CM | POA: Diagnosis not present

## 2019-11-13 DIAGNOSIS — E782 Mixed hyperlipidemia: Secondary | ICD-10-CM | POA: Diagnosis not present

## 2019-11-13 DIAGNOSIS — G309 Alzheimer's disease, unspecified: Secondary | ICD-10-CM | POA: Diagnosis not present

## 2019-11-13 DIAGNOSIS — D649 Anemia, unspecified: Secondary | ICD-10-CM | POA: Diagnosis not present

## 2019-11-13 LAB — RENAL FUNCTION PANEL
Albumin: 2.4 g/dL — ABNORMAL LOW (ref 3.5–5.0)
Anion gap: 11 (ref 5–15)
BUN: 44 mg/dL — ABNORMAL HIGH (ref 8–23)
CO2: 24 mmol/L (ref 22–32)
Calcium: 8.6 mg/dL — ABNORMAL LOW (ref 8.9–10.3)
Chloride: 103 mmol/L (ref 98–111)
Creatinine, Ser: 2.11 mg/dL — ABNORMAL HIGH (ref 0.61–1.24)
GFR calc Af Amer: 31 mL/min — ABNORMAL LOW (ref >60)
GFR calc non Af Amer: 27 mL/min — ABNORMAL LOW (ref >60)
Glucose, Bld: 237 mg/dL — ABNORMAL HIGH (ref 70–99)
Phosphorus: 3.4 mg/dL (ref 2.5–4.6)
Potassium: 4.6 mmol/L (ref 3.5–5.1)
Sodium: 138 mmol/L (ref 135–145)

## 2019-11-13 LAB — CBC
HCT: 26 % — ABNORMAL LOW (ref 39.0–52.0)
Hemoglobin: 8 g/dL — ABNORMAL LOW (ref 13.0–17.0)
MCH: 24.4 pg — ABNORMAL LOW (ref 26.0–34.0)
MCHC: 30.8 g/dL (ref 30.0–36.0)
MCV: 79.3 fL — ABNORMAL LOW (ref 80.0–100.0)
Platelets: 256 10*3/uL (ref 150–400)
RBC: 3.28 MIL/uL — ABNORMAL LOW (ref 4.22–5.81)
RDW: 17.4 % — ABNORMAL HIGH (ref 11.5–15.5)
WBC: 13 10*3/uL — ABNORMAL HIGH (ref 4.0–10.5)
nRBC: 0.4 % — ABNORMAL HIGH (ref 0.0–0.2)

## 2019-11-13 LAB — URINE CULTURE: Culture: NO GROWTH

## 2019-11-13 LAB — BRAIN NATRIURETIC PEPTIDE: B Natriuretic Peptide: 396.1 pg/mL — ABNORMAL HIGH (ref 0.0–100.0)

## 2019-11-13 LAB — GLUCOSE, CAPILLARY
Glucose-Capillary: 192 mg/dL — ABNORMAL HIGH (ref 70–99)
Glucose-Capillary: 232 mg/dL — ABNORMAL HIGH (ref 70–99)
Glucose-Capillary: 243 mg/dL — ABNORMAL HIGH (ref 70–99)

## 2019-11-13 MED ORDER — IPRATROPIUM-ALBUTEROL 0.5-2.5 (3) MG/3ML IN SOLN: 3.0000 mL | Freq: Three times a day (TID) | RESPIRATORY_TRACT | 0 refills | Status: DC

## 2019-11-13 MED ORDER — FUROSEMIDE 40 MG PO TABS: 40.0000 mg | ORAL_TABLET | Freq: Every day | ORAL | 0 refills | Status: DC

## 2019-11-13 MED ORDER — FUROSEMIDE 10 MG/ML IJ SOLN
40.0000 mg | Freq: Once | INTRAMUSCULAR | Status: AC
  Administered 2019-11-13: 40 mg via INTRAVENOUS
  Filled 2019-11-13: qty 4

## 2019-11-13 MED ORDER — ACETAMINOPHEN 325 MG PO TABS: 650.0000 mg | ORAL_TABLET | Freq: Four times a day (QID) | ORAL | 0 refills | Status: AC | PRN

## 2019-11-13 MED ORDER — BUDESONIDE 0.25 MG/2ML IN SUSP: 0.2500 mg | Freq: Two times a day (BID) | RESPIRATORY_TRACT | 12 refills | Status: DC

## 2019-11-13 MED ORDER — CEPHALEXIN 500 MG PO CAPS: 500.0000 mg | ORAL_CAPSULE | Freq: Three times a day (TID) | ORAL | 0 refills | Status: AC

## 2019-11-13 NOTE — Plan of Care (Signed)
?  Problem: Clinical Measurements: ?Goal: Will remain free from infection ?Outcome: Progressing ?  ?

## 2019-11-13 NOTE — TOC Transition Note (Signed)
Transition of Care [facility]) - CM/SW Discharge Note   Patient Details  Name: [name] MRN: [id number] Date of Birth: 1928-07-18  Transition of Care [facility]) CM/SW Contact:  Vinie Sill, [place] Phone Number: 11/13/2019, 3:30 PM   Clinical Narrative:     Patient will DC to: [facility] DC Date:11/13/2019 Family Notified:patient's son and spouse  Transport By: Corey Harold  Per MD patient is ready for discharge. RN, patient, and facility notified of DC. Discharge Summary sent to facility. RN given number for [phone],room [phone]. Ambulance transport requested for patient.   Clinical Social Worker signing off.  Thurmond Butts, MSW, LCSWA Clinical Social Worker    Final next level of care: Skilled Nursing Facility Barriers to Discharge: Barriers Resolved   Patient Goals and CMS Choice        Discharge Placement PASRR number recieved: 11/13/19            Patient [facility] bed at: [facility] Patient to be transferred to facility by: [place] Name of family member notified: pateint's son and wife Patient and family notified of of transfer: 11/13/19  Discharge Plan and Services In-house Referral: Clinical Social Work                                   Social Determinants of Health (SDOH) Interventions     Readmission Risk Interventions No flowsheet data found.

## 2019-11-13 NOTE — Progress Notes (Signed)
Physical Therapy Treatment Patient Details Name: [name] MRN: [id number] DOB: 1928-08-09 Today's Date: 11/13/2019    History of Present Illness Pt is an 86 yo male presenting with increased SOB, workup revealed acute on chronic CHF decompensation with hyperkalemia, UTI, and AKI. Patient also found to have septic olecranon bursitis in L elbow s/p I&D on 7/11 by Dr. Percell Miller. PMH includes: dementia, CAD s/p stent placement, COPD, DM, HTN, and GERD.    PT Comments    Continuing work on functional mobility and activity tolerance;  Pt is WBAT LUE, and able to use the RW, which is much better for his steadiness with walking with bilateral UE support; Still, he did have 4 moments of loss of balance, typically to the R, and required min assist to correct; Moderate cues for RW proximity and safety; tending to run into objects on the R; Discussion with Care Team re: possible dc home versus SNF for post-acute rehab -- ultimately, Dr. Cathlean Sauer consulted family, who are in agreement with SNF   Follow Up Recommendations  SNF     Equipment Recommendations  Rolling walker with 5" wheels;3in1 (PT);Wheelchair (measurements PT)    Recommendations for Other Services       Precautions / Restrictions Precautions Precautions: Fall Precaution Comments: WBAT per Ortho PA Required Braces or Orthoses: Sling Restrictions LUE Weight Bearing: Weight bearing as tolerated Other Position/Activity Restrictions: may use walker    Mobility  Bed Mobility                  Transfers Overall transfer level: Needs assistance Equipment used: Rolling walker (2 wheeled) Transfers: Sit to/from Stand Sit to Stand: Min assist         General transfer comment: Min, almost mod, assist to steady pt during transition of hands to RW  Ambulation/Gait Ambulation/Gait assistance: Min assist;Mod assist Gait Distance (Feet): 75 Feet Assistive device: Rolling walker (2 wheeled) Gait Pattern/deviations: Step-through  pattern;Drifts right/left;Staggering right;Decreased step length - right Gait velocity: decreased   General Gait Details: Min assist to walk in hallway with RW; cues to self-monitor for activity tolerance; 3 notable losses of balance to R side, requiring light mod assist to regain balance; tends to lose balance to the right, with notably short R step length; Max cues for safety, noted he tends to sit prematurely   Stairs             Wheelchair Mobility    Modified Rankin (Stroke Patients Only)       Balance     Sitting balance-Leahy Scale: Fair       Standing balance-Leahy Scale: Poor                              Cognition Arousal/Alertness: Awake/alert Behavior During Therapy: Impulsive Overall Cognitive Status: History of cognitive impairments - at baseline                               Problem Solving: Slow processing;Difficulty sequencing;Requires verbal cues;Requires tactile cues General Comments: Difficulty answering the question: what was hard about walking?  When asked how it feels with his arm supported on pillow and eggcrate-type cusion, he replied, "Fantastic"      Exercises      General Comments  VSS throughout activity      Pertinent Vitals/Pain Pain Assessment: No/denies pain Pain Intervention(s): Monitored during session    Home  Living                      Prior Function            PT Goals (current goals can now be found in the care plan section) Acute Rehab PT Goals Patient Stated Goal: To get stronger.  PT Goal Formulation: With patient/family Time For Goal Achievement: 11/21/19 Potential to Achieve Goals: Fair Progress towards PT goals: Progressing toward goals    Frequency    Min 3X/week      PT Plan Current plan remains appropriate    Co-evaluation              AM-PAC PT "6 Clicks" Mobility   Outcome Measure  Help needed turning from your back to your side while in a flat bed  without using bedrails?: A Little Help needed moving from lying on your back to sitting on the side of a flat bed without using bedrails?: A Little Help needed moving to and from a bed to a chair (including a wheelchair)?: A Little Help needed standing up from a chair using your arms (e.g., wheelchair or bedside chair)?: A Little Help needed to walk in hospital room?: A Lot Help needed climbing 3-5 steps with a railing? : Total 6 Click Score: 15    End of Session Equipment Utilized During Treatment: Gait belt Activity Tolerance: Patient tolerated treatment well Patient left: in chair;with call bell/phone within reach;with chair alarm set Nurse Communication: Mobility status;Other (comment) (considerations for dc planning) PT Visit Diagnosis: Repeated falls (R29.6);History of falling (Z91.81);Difficulty in walking, not elsewhere classified (R26.2)     Time: 3343-5686 PT Time Calculation (min) (ACUTE ONLY): 24 min  Charges:  $Gait Training: 23-37 mins                     Roney Marion, [place] Pager [phone] Office [place] 11/13/2019, 1:39 PM

## 2019-11-13 NOTE — Discharge Summary (Addendum)
Physician Discharge Summary  [name] UYQ:[id number] DOB: 05/14/1928 DOA: 11/06/2019  PCP: Robert Battles, MD  Admit date: 11/06/2019 Discharge date: 11/13/2019  Admitted From: Home  Disposition:  SNF  Recommendations for Outpatient Follow-up and new medication changes:  1. Follow up with [name] in 7 days.  2. Follow up with Orthopedics 07/18 for suture removal.  3. Continue cephalexin for 12 more days.  4. Holding angiotensin receptor blocker (telemisartan due to AKI and hyperkalemia) 5. Right renal mass 4.8x4.3 cm, conservative management.  6. Holding AV blockers due to bradycardia.  7. Patient placed on furosemide 40 mg daily follow up renal panel as outpatient in 7 days.   I spoke over the phone with the patient's son ([name])  about patient's  condition, plan of care, prognosis and all questions were addressed.  Home Health: na   Equipment/Devices: na    Discharge Condition: stable  CODE STATUS:  dnr  Diet recommendation: heart healthy and diabetic prudent.   Brief/Interim Summary: Patient admitted to the hospital with the working diagnosis with acute on chronic diastolic heart failure complicated with AKI, hyperkalemia and left elbow infective bursitis  86 year old male who presented with dyspnea. He does have significant past medical history for coronary artery disease, COPD, type 2 diabetes mellitus, hypertension and GERD. Patient he developed worsening lower extremity edema for about 2 weeks, eventually leading to dyspnea along with neck and facial swelling. On his initial physical examination blood pressure 148/70, heart rate 45-50, respiratory rate 20, temperature 97.4, oxygen saturation 99%. Patient had positive JVD, his lungs had bilateral rales at the lower lobes, heart S1-S2, present, rhythmic, soft abdomen, 2+ lower extremity edema. Left upper extremity with elbow erythema and edema.  Sodium 137, potassium 6.2, chloride 106, bicarb 19, glucose 190, BUN 40,  creatinine 2.32, BNP 477, troponin 11, white count 14.5, hemoglobin 9.0, hematocrit 28.9, platelets 214. SARS COVID-19 negative. Urinalysis specific gravity 1.021, 3-5 red cells, more than 50 white cells. CT of the abdomen with 4.8 cm x 4.3 cm soft tissue mass right kidney. Chest radiograph with increased interstitial markings bilaterally. EKG 49 bpm, normal axis, normal intervals, sinus rhythm, no ST segment or T wave changes.  Patient placed on IV furosemide for diuresis and nephrotoxic agents were held.   He was diagnosed with left arm olecranon bursitis, arthrocentesis performed 07/08, fluid with 26,250 wbc, 93 PMN. Orthopedics performed I&D on 07/11, obtaining purulent fluid. Patient placed on cephalosporins with good response and toleration.   Discharge Diagnoses:  Principal Problem:   Acute renal failure (HCC) Active Problems:   OSA (obstructive sleep apnea)   COPD (chronic obstructive pulmonary disease) (HCC)   Iatrogenic adrenal insufficiency (HCC)   Acute on chronic diastolic CHF (congestive heart failure) (HCC)   CHF (congestive heart failure) (HCC)   Hyperkalemia    1.  Acute on chronic diastolic heart failure exacerbation/ bradycardia.  Patient was admitted to the medical ward, he was placed on a remote telemetry monitor.  He received diuresis with intravenous furosemide, negative fluid balance was achieved with improvement of his symptoms.  Further work-up with echocardiography showed preserved LV systolic function 65 to 63%, no wall motion abnormalities.  Moderate LVH.  Holding AV blockade for now.   2.  Acute kidney injury on chronic kidney disease stage IIIa with hyperkalemia/ metabolic acidosis (non anion gap).  Patient received diuresis with furosemide with good response.  Nephrotoxic's occasions were avoided.    Currently his renal function is improving with discharge creatinine  2.42, bicarb 25, potassium 4.6. Continue holding losartan.   3.  Left upper extremity  olecranon bursitis/cellulitis, patient responded well to I&D, he will continue antibiotic therapy with cephalexin for 12 more days.  Follow-up with orthopedics on July 18 for suture removal.  4.  Iron deficiency anemia.  Patient did receive IV iron during his hospitalization.  Follow-up iron levels as an outpatient.  5.  COPD without exacerbation, OSA.  Patient received bronchodilators and inhaled corticosteroids.  Continue home dose of prednisone 7.5 mg daily.  CPAP at night.   At discharge has scattered wheezing, likely chronic from COPD.   6.  Hypertension.  Continue blood pressure control with amlodipine. Continue with aspirin and clopidogrel per prior admission orders.   7.  Uncontrolled type II tablets mellitus. Hgb A1 c 9,4/ dyslipidemia.  Patient will resume insulin regimen and antihypertensive glycemic agents at discharge.   Continue with atorvastatin.   8.  BPH.  Continue finasteride and mirabegron.   9.  GERD. On pantoprazole.   10. Dementia. Continue with donepezil and memantine.     Discharge Instructions   Allergies as of 11/13/2019      Reactions   Ciprofloxacin Hcl Other (See Comments)   Unknown reaction   Mirabegron Other (See Comments)   FATIGUE   Penicillins    Has patient had a PCN reaction causing immediate rash, facial/tongue/throat swelling, SOB or lightheadedness with hypotension: Unknown Has patient had a PCN reaction causing severe rash involving mucus membranes or skin necrosis: Unknown Has patient had a PCN reaction that required hospitalization Unknown Has patient had a PCN reaction occurring within the last 10 years: Unknown If all of the above answers are "NO", then may proceed with Cephalosporin use. Tolerate cephalosporins   Sulfa Antibiotics Hives, Rash   Sulfamethoxazole Rash   Sulfonamide Derivatives Rash      Medication List    STOP taking these medications   amoxicillin 500 MG capsule Commonly known as: AMOXIL   cefUROXime 500 MG  tablet Commonly known as: CEFTIN   ferrous sulfate 324 (65 Fe) MG Tbec   iron polysaccharides 150 MG capsule Commonly known as: NIFEREX   melatonin 5 MG Tabs   nebivolol 5 MG tablet Commonly known as: BYSTOLIC   telmisartan 80 MG tablet Commonly known as: MICARDIS     TAKE these medications   acetaminophen 325 MG tablet Commonly known as: TYLENOL Take 2 tablets (650 mg total) by mouth every 6 (six) hours as needed for moderate pain.   albuterol 108 (90 Base) MCG/ACT inhaler Commonly known as: VENTOLIN HFA Inhale 2 puffs into the lungs every 6 (six) hours as needed for wheezing or shortness of breath.   albuterol (2.5 MG/3ML) 0.083% nebulizer solution Commonly known as: PROVENTIL Take 2.5 mg by nebulization 2 (two) times daily.   amLODipine 5 MG tablet Commonly known as: NORVASC Take 5 mg by mouth daily.   aspirin EC 81 MG tablet Take 81 mg by mouth daily with supper.   atorvastatin 20 MG tablet Commonly known as: LIPITOR Take 20 mg by mouth daily.   brimonidine 0.15 % ophthalmic solution Commonly known as: ALPHAGAN Place 1 drop into the left eye 2 (two) times daily.   budesonide 0.25 MG/2ML nebulizer solution Commonly known as: PULMICORT Take 2 mLs (0.25 mg total) by nebulization 2 (two) times daily.   cephALEXin 500 MG capsule Commonly known as: KEFLEX Take 1 capsule (500 mg total) by mouth every 8 (eight) hours for 12 days.   clopidogrel  75 MG tablet Commonly known as: PLAVIX Take 1 tablet (75 mg total) by mouth daily.   donepezil 10 MG tablet Commonly known as: ARICEPT Take 10 mg by mouth daily with supper.   escitalopram 5 MG tablet Commonly known as: LEXAPRO Take 5 mg by mouth daily with supper.   finasteride 5 MG tablet Commonly known as: PROSCAR Take 5 mg by mouth daily with supper.   furosemide 40 MG tablet Commonly known as: LASIX Take 1 tablet (40 mg total) by mouth daily. Start taking on: November 14, 2019   HumaLOG KwikPen 100 UNIT/ML  KiwkPen Generic drug: insulin lispro Inject 2-5 Units into the skin See admin instructions. 2-5 units per sliding scale   ipratropium-albuterol 0.5-2.5 (3) MG/3ML Soln Commonly known as: DUONEB Take 3 mLs by nebulization in the morning, at noon, and at bedtime.   latanoprost 0.005 % ophthalmic solution Commonly known as: XALATAN Place 1 drop into both eyes at bedtime.   loratadine 10 MG tablet Commonly known as: CLARITIN Take 10 mg by mouth daily.   memantine 10 MG tablet Commonly known as: NAMENDA Take 10 mg by mouth 2 (two) times daily.   montelukast 10 MG tablet Commonly known as: SINGULAIR Take 10 mg by mouth daily.   multivitamin with minerals Tabs tablet Take 1 tablet by mouth daily.   Myrbetriq 50 MG Tb24 tablet Generic drug: mirabegron ER Take 50 mg by mouth daily with supper.   nitroGLYCERIN 0.4 MG SL tablet Commonly known as: NITROSTAT Place 0.4 mg under the tongue every 5 (five) minutes as needed for chest pain.   pantoprazole 40 MG tablet Commonly known as: PROTONIX Take 40 mg by mouth 2 (two) times daily.   predniSONE 5 MG tablet Commonly known as: DELTASONE Take 7.5 mg by mouth daily with breakfast.   sitaGLIPtin-metformin 50-1000 MG tablet Commonly known as: JANUMET Take 1 tablet by mouth 2 (two) times daily with a meal.   Trelegy Ellipta 100-62.5-25 MCG/INH Aepb Generic drug: Fluticasone-Umeclidin-Vilant Inhale 1 puff into the lungs daily.   vitamin C 500 MG tablet Commonly known as: ASCORBIC ACID Take 500 mg by mouth daily with supper.   Vitamin D 50 MCG (2000 UT) Caps Take 2,000 Units by mouth daily with supper.       Follow-up Information    Renette Butters, MD. Schedule an appointment as soon as possible for a visit in 1 week(s).   Specialty: Orthopedic Surgery Contact information: [address] [place] [address] [phone]        Robert Battles, MD Follow up in 1 week(s).   Specialty: Internal  Medicine Contact information: [place] [address] [phone]              Allergies  Allergen Reactions  . Ciprofloxacin Hcl Other (See Comments)    Unknown reaction  . Mirabegron Other (See Comments)    FATIGUE  . Penicillins     Has patient had a PCN reaction causing immediate rash, facial/tongue/throat swelling, SOB or lightheadedness with hypotension: Unknown Has patient had a PCN reaction causing severe rash involving mucus membranes or skin necrosis: Unknown Has patient had a PCN reaction that required hospitalization Unknown Has patient had a PCN reaction occurring within the last 10 years: Unknown If all of the above answers are "NO", then may proceed with Cephalosporin use. Tolerate cephalosporins  . Sulfa Antibiotics Hives and Rash  . Sulfamethoxazole Rash  . Sulfonamide Derivatives Rash    Consultations:  Cardiology   Pulmonary    Procedures/Studies: CT ABDOMEN PELVIS WO CONTRAST  Result Date: 11/06/2019 CLINICAL DATA:  Bilateral leg swelling. EXAM: CT ABDOMEN AND PELVIS WITHOUT CONTRAST TECHNIQUE: Multidetector CT imaging of the abdomen and pelvis was performed following the standard protocol without IV contrast. COMPARISON:  Sep 10, 2009 FINDINGS: Lower chest: Mild to moderate severity atelectasis is seen within the posterior aspect of the right lung base. There is a very small right pleural effusion. Hepatobiliary: No focal liver abnormality is seen. No gallstones, gallbladder wall thickening, or biliary dilatation. Pancreas: Unremarkable. No pancreatic ductal dilatation or surrounding inflammatory changes. Spleen: Normal in size without focal abnormality. Adrenals/Urinary Tract: Adrenal glands are unremarkable. Kidneys are normal in size, without renal calculi or hydronephrosis. A 4.8 cm x 4.3 cm isodense soft tissue mass is seen within the posterior aspect of the mid to lower right kidney (axial CT images 27 through 41, CT series number 3).  This represents a new finding compared to the prior study. Bladder is unremarkable. Stomach/Bowel: There is a large gastric hernia. Appendix appears normal. No evidence of bowel dilatation. Noninflamed diverticula are seen within the ascending, descending and sigmoid colon. Vascular/Lymphatic: There is marked severity calcification of the abdominal aorta. No enlarged abdominal or pelvic lymph nodes. Reproductive: Prostate radiation implantation seeds are seen within a mildly enlarged prostate gland. Other: A 3.1 cm x 3.9 cm fat containing paraumbilical hernia is seen on the right. Musculoskeletal: Marked severity multilevel degenerative changes seen throughout the lumbar spine. This is most prominent at the level of L2-L3. IMPRESSION: 1. 4.8 cm x 4.3 cm isodense soft tissue mass within the posterior aspect of the mid to lower right kidney which represents a new finding compared to the prior study. This is concerning for the presence of a renal cell carcinoma. Further evaluation with MRI is recommended. 2. Large gastric hernia. 3. Colonic diverticulosis. 4. Fat containing paraumbilical hernia. 5. Prostate radiation implantation seeds within a mildly enlarged prostate gland. 6. Marked severity multilevel degenerative changes throughout the lumbar spine. This is most prominent at the level of L2-L3. 7. Very small right pleural effusion. 8. Aortic atherosclerosis. Aortic Atherosclerosis (ICD10-I70.0). Electronically Signed   By: Virgina Norfolk M.D.   On: 11/06/2019 20:23   DG Chest 2 View  Result Date: 11/06/2019 CLINICAL DATA:  Onset shortness of breath this morning. EXAM: CHEST - 2 VIEW COMPARISON:  Single-view of the chest 05/07/2017. FINDINGS: There is cardiomegaly and interstitial pulmonary edema with small bilateral pleural effusions. No pneumothorax. IMPRESSION: Cardiomegaly and interstitial edema. Electronically Signed   By: Inge Rise M.D.   On: 11/06/2019 12:15   US RENAL  Result Date:  11/06/2019 CLINICAL DATA:  Acute kidney injury. EXAM: RENAL / URINARY TRACT ULTRASOUND COMPLETE COMPARISON:  None. FINDINGS: Right Kidney: Renal measurements: 10.7 x 5.2 x 5.1 cm = volume: 150 mL. Increased echogenicity of renal parenchyma is noted suggesting medical renal disease. 3.8 cm complex abnormality is seen involving the lower pole which may represent neoplasm. 7 mm simple cyst is seen in midpole. No hydronephrosis visualized. Left Kidney: Renal measurements: 10.7 x 5.7 x 4.7 cm = volume: 150 mL. Increased echogenicity of renal parenchyma is noted suggesting medical renal disease. No mass or hydronephrosis visualized. Bladder: Appears normal for degree of bladder distention. Other: None. IMPRESSION: Increased echogenicity of renal parenchyma is noted bilaterally suggesting medical renal disease. No hydronephrosis or renal obstruction is noted. 3.8 cm complex rounded abnormality is seen involving the lower pole of the right kidney  which may represent complex cyst, but possible neoplasm cannot be excluded. Further evaluation with CT or MRI is recommended. Electronically Signed   By: Marijo Conception M.D.   On: 11/06/2019 16:30   ECHOCARDIOGRAM COMPLETE  Result Date: 11/07/2019    ECHOCARDIOGRAM REPORT   Patient Name:   LIANDRO THELIN Date of Exam: 11/07/2019 Medical Rec #:  [id number]     Height:       67.0 in Accession #:    [id number]    Weight:       180.1 lb Date of Birth:  May 08, 1928     BSA:          1.934 m Patient Age:    86 years      BP:           130/69 mmHg Patient Gender: M             HR:           60 bpm. Exam Location:  Inpatient Procedure: 2D Echo Indications:     CHF-Acute Diastolic 580.99 / I33.82  History:         Patient has prior history of Echocardiogram examinations, most                  recent 05/08/2017. CAD, COPD; Risk Factors:Hypertension,                  Diabetes, Dyslipidemia and Sleep Apnea. Acute renal failure,                  Renal cell carcinoma right kidney, GERD, Asthma.   Sonographer:     Darlina Sicilian RDCS Referring Phys:  [id number] Lequita Halt Diagnosing Phys: Adrian Prows MD IMPRESSIONS  1. Left ventricular ejection fraction, by estimation, is 65 to 70%. Left ventricular ejection fraction by PLAX is 64 %. The left ventricle has normal function. The left ventricle has no regional wall motion abnormalities. There is moderate left ventricular hypertrophy. Left ventricular diastolic parameters are consistent with Grade II diastolic dysfunction (pseudonormalization). Elevated left ventricular end-diastolic pressure.  2. Right ventricular systolic function is normal. The right ventricular size is normal.  3. The mitral valve is degenerative. No evidence of mitral valve regurgitation.  4. The aortic valve is tricuspid. Aortic valve regurgitation is not visualized. Conclusion(s)/Recommendation(s): Poor echo window. Wall motion with reduced sensitivity. Consider definity if clinically indicated. Presence of mitral apparatus makes diastolic dysfunction evaluation difficult. FINDINGS  Left Ventricle: Left ventricular ejection fraction, by estimation, is 65 to 70%. Left ventricular ejection fraction by PLAX is 64 %. The left ventricle has normal function. The left ventricle has no regional wall motion abnormalities. The left ventricular internal cavity size was normal in size. There is moderate left ventricular hypertrophy. Left ventricular diastolic parameters are consistent with Grade II diastolic dysfunction (pseudonormalization). Elevated left ventricular end-diastolic pressure. Right Ventricle: The right ventricular size is normal. No increase in right ventricular wall thickness. Right ventricular systolic function is normal. Left Atrium: Left atrial size was normal in size. Right Atrium: Right atrial size was normal in size. Pericardium: There is no evidence of pericardial effusion. Mitral Valve: The mitral valve is degenerative in appearance. There is mild calcification of the mitral  valve leaflet(s). Mild mitral annular calcification. No evidence of mitral valve regurgitation. Tricuspid Valve: The tricuspid valve is normal in structure. Tricuspid valve regurgitation is not demonstrated. No evidence of tricuspid stenosis. Aortic Valve: The aortic valve is tricuspid. Aortic valve regurgitation is  not visualized. Pulmonic Valve: The pulmonic valve was not well visualized. Pulmonic valve regurgitation is not visualized. Aorta: The aortic root is normal in size and structure. Venous: The inferior vena cava was not well visualized. IAS/Shunts: The interatrial septum was not well visualized.  LEFT VENTRICLE PLAX 2D LV EF:         Left            Diastology                ventricular     LV e' lateral:   4.46 cm/s                ejection        LV E/e' lateral: 19.1                fraction by     LV e' medial:    3.26 cm/s                PLAX is 64      LV E/e' medial:  26.1                %. LVIDd:         3.50 cm LVIDs:         2.30 cm LV PW:         1.30 cm LV IVS:        1.50 cm  RIGHT VENTRICLE TAPSE (M-mode): 1.9 cm LEFT ATRIUM           Index LA Vol (A2C): 54.0 ml 27.92 ml/m LA Vol (A4C): 54.9 ml 28.38 ml/m  AORTIC VALVE LVOT Vmax:   92.00 cm/s LVOT Vmean:  58.900 cm/s LVOT VTI:    0.201 m MITRAL VALVE MV Area (PHT): 2.34 cm     SHUNTS MV Decel Time: 324 msec     Systemic VTI: 0.20 m MV E velocity: 85.10 cm/s MV A velocity: 110.00 cm/s MV E/A ratio:  0.77 Adrian Prows MD Electronically signed by Adrian Prows MD Signature Date/Time: 11/07/2019/9:36:45 PM    Final    VAS [place] UPPER EXTREMITY VENOUS DUPLEX  Result Date: 11/07/2019 UPPER VENOUS STUDY  Indications: Edema, and Pain Comparison Study: No prior studies. Performing Technologist: Darlin Coco  Examination Guidelines: A complete evaluation includes B-mode imaging, spectral Doppler, color Doppler, and power Doppler as needed of all accessible portions of each vessel. Bilateral testing is considered an integral part of a complete examination.  Limited examinations for reoccurring indications may be performed as noted.  Right Findings: +----------+------------+---------+-----------+----------+-------+ RIGHT     CompressiblePhasicitySpontaneousPropertiesSummary +----------+------------+---------+-----------+----------+-------+ Subclavian               Yes       Yes                      +----------+------------+---------+-----------+----------+-------+  Left Findings: +----------+------------+---------+-----------+----------+-------+ LEFT      CompressiblePhasicitySpontaneousPropertiesSummary +----------+------------+---------+-----------+----------+-------+ IJV           Full       Yes       Yes                      +----------+------------+---------+-----------+----------+-------+ Subclavian    Full       Yes       Yes                      +----------+------------+---------+-----------+----------+-------+ Axillary      Full  Yes       Yes                      +----------+------------+---------+-----------+----------+-------+ Brachial      Full                                          +----------+------------+---------+-----------+----------+-------+ Radial        Full                                          +----------+------------+---------+-----------+----------+-------+ Ulnar         Full                                          +----------+------------+---------+-----------+----------+-------+ Cephalic      Full                                          +----------+------------+---------+-----------+----------+-------+ Basilic       Full                                          +----------+------------+---------+-----------+----------+-------+  Summary:  Right: No evidence of thrombosis in the subclavian.  Left: No evidence of deep vein thrombosis in the upper extremity. No evidence of superficial vein thrombosis in the upper extremity.  *See table(s) above for  measurements and observations.  Diagnosing physician: Servando Snare MD Electronically signed by Servando Snare MD on 11/07/2019 at 8:15:04 PM.    Final       Procedures: left elbow joint arthrocentesis.  IRRIGATION AND DEBRIDEMENT EXTREMITY, left olecranon bursa   Subjective: Patient is feeling better, no dyspnea or chest pain, tolerating po well. No nausea or vomiting.   Discharge Exam: Vitals:   11/13/19 0734 11/13/19 0821  BP: (!) 159/65   Pulse: (!) 54   Resp: 18   Temp: (!) 97.5 F (36.4 C)   SpO2: 99% 99%   Vitals:   11/12/19 2357 11/13/19 0501 11/13/19 0734 11/13/19 0821  BP: (!) 127/58 (!) 151/82 (!) 159/65   Pulse: (!) 50 65 (!) 54   Resp: 18 18 18    Temp: 97.9 F (36.6 C) (!) 97.4 F (36.3 C) (!) 97.5 F (36.4 C)   TempSrc: Oral Oral Oral   SpO2: 100% 100% 99% 99%  Weight:      Height:        General: Not in pain or dyspnea.  Neurology: Awake and alert, non focal  E ENT: no pallor, no icterus, oral mucosa moist Cardiovascular: No JVD. S1-S2 present, rhythmic, no gallops, rubs, or murmurs. No lower extremity edema. Pulmonary: positive breath sounds bilaterally,  no wheezing, rhonchi or rales. Gastrointestinal. Abdomen soft with no organomegaly, non tender, no rebound or guarding Skin. No rashes Musculoskeletal: left elbow with improved edema, sutures in place.    The results of significant diagnostics from this hospitalization (including imaging, microbiology, ancillary and laboratory) are listed below for reference.     Microbiology: Recent  Results (from the past 240 hour(s))  SARS Coronavirus 2 by RT PCR (hospital order, performed in [facility] hospital lab) Nasopharyngeal Nasopharyngeal Swab     Status: None   Collection Time: 11/06/19  2:34 PM   Specimen: Nasopharyngeal Swab  Result Value Ref Range Status   SARS Coronavirus 2 NEGATIVE NEGATIVE Final    Comment: (NOTE) SARS-CoV-2 target nucleic acids are NOT DETECTED.  The SARS-CoV-2 RNA is  generally detectable in upper and lower respiratory specimens during the acute phase of infection. The lowest concentration of SARS-CoV-2 viral copies this assay can detect is 250 copies / mL. A negative result does not preclude SARS-CoV-2 infection and should not be used as the sole basis for treatment or other patient management decisions.  A negative result may occur with improper specimen collection / handling, submission of specimen other than nasopharyngeal swab, presence of viral mutation(s) within the areas targeted by this assay, and inadequate number of viral copies (<250 copies / mL). A negative result must be combined with clinical observations, patient history, and epidemiological information.  Fact Sheet for Patients:   StrictlyIdeas.no  Fact Sheet for Healthcare Providers: BankingDealers.co.za  This test is not yet approved or  cleared by the [place] FDA and has been authorized for detection and/or diagnosis of SARS-CoV-2 by FDA under an Emergency Use Authorization (EUA).  This EUA will remain in effect (meaning this test can be used) for the duration of the COVID-19 declaration under Section 564(b)(1) of the Act, 21 U.S.C. section 360bbb-3(b)(1), unless the authorization is terminated or revoked sooner.  Performed at [facility], [place] [address] [place], [place] [address]   Body fluid culture     Status: None   Collection Time: 11/07/19  1:48 PM   Specimen: Fluid  Result Value Ref Range Status   Specimen Description FLUID SYNOVIAL CYSTS  Final   Special Requests NONE  Final   Gram Stain   Final    ABUNDANT WBC PRESENT,BOTH PMN AND MONONUCLEAR RARE GRAM POSITIVE COCCI CRITICAL RESULT CALLED TO, READ BACK BY AND VERIFIED WITH: L HUYNH RN 11/07/19 1724 JDW Performed at [facility], [place] [address], [place], [place] [address]    Culture FEW STAPHYLOCOCCUS AUREUS  Final   Report Status 11/10/2019 FINAL   Final   Organism ID, Bacteria STAPHYLOCOCCUS AUREUS  Final      Susceptibility   Staphylococcus aureus - MIC*    CIPROFLOXACIN <=0.5 SENSITIVE Sensitive     ERYTHROMYCIN <=0.25 SENSITIVE Sensitive     GENTAMICIN <=0.5 SENSITIVE Sensitive     OXACILLIN <=0.25 SENSITIVE Sensitive     TETRACYCLINE <=1 SENSITIVE Sensitive     VANCOMYCIN <=0.5 SENSITIVE Sensitive     TRIMETH/SULFA <=10 SENSITIVE Sensitive     CLINDAMYCIN <=0.25 SENSITIVE Sensitive     RIFAMPIN <=0.5 SENSITIVE Sensitive     Inducible Clindamycin NEGATIVE Sensitive     * FEW STAPHYLOCOCCUS AUREUS  Culture, blood (routine x 2)     Status: None   Collection Time: 11/07/19  7:17 PM   Specimen: BLOOD  Result Value Ref Range Status   Specimen Description BLOOD RIGHT ANTECUBITAL  Final   Special Requests   Final    BOTTLES DRAWN AEROBIC AND ANAEROBIC Blood Culture adequate volume   Culture   Final    NO GROWTH 5 DAYS Performed at [facility], [place] [address] [place], [place] [address]    Report Status 11/12/2019 FINAL  Final  Culture, blood (routine  x 2)     Status: None   Collection Time: 11/07/19  7:25 PM   Specimen: BLOOD RIGHT HAND  Result Value Ref Range Status   Specimen Description BLOOD RIGHT HAND  Final   Special Requests   Final    BOTTLES DRAWN AEROBIC ONLY Blood Culture adequate volume   Culture   Final    NO GROWTH 5 DAYS Performed at [facility], [address] [place], [place] [address]    Report Status 11/12/2019 FINAL  Final  Culture, Urine     Status: None   Collection Time: 11/12/19  4:29 AM   Specimen: Urine, Random  Result Value Ref Range Status   Specimen Description URINE, RANDOM  Final   Special Requests NONE  Final   Culture   Final    NO GROWTH Performed at [facility], [place] [address] [place], [place] [address]    Report Status 11/13/2019 FINAL  Final     Labs: BNP (last 3 results) Recent Labs    11/07/19 0241 11/08/19 0306 11/09/19 0304  BNP 377.8*  189.6* 322.0*   Basic Metabolic Panel: Recent Labs  Lab 11/08/19 0306 11/09/19 0304 11/10/19 0340 11/11/19 0242 11/12/19 0323  NA 138 139 141 140 138  K 4.4 4.0 3.7 4.3 4.6  CL 101 103 104 105 103  CO2 25 22 25 25 25   GLUCOSE 205* 191* 161* 111* 189*  BUN 45* 48* 44* 39* 47*  CREATININE 3.37* 3.09* 2.79* 2.60* 2.42*  CALCIUM 8.6* 8.3* 8.6* 8.3* 8.5*  PHOS  --  6.2* 4.9* 4.6 3.2   Liver Function Tests: Recent Labs  Lab 11/09/19 0304 11/10/19 0340 11/11/19 0242 11/12/19 0323  ALBUMIN 2.3* 2.5* 2.0* 2.2*   No results for input(s): LIPASE, AMYLASE in the last 168 hours. No results for input(s): AMMONIA in the last 168 hours. CBC: Recent Labs  Lab 11/09/19 0304 11/10/19 0340 11/11/19 0242 11/12/19 0323 11/13/19 0425  WBC 12.9* 12.9* 11.6* 13.6* 13.0*  HGB 8.5* 8.7* 7.5* 7.5* 8.0*  HCT 27.1* 28.0* 24.3* 23.9* 26.0*  MCV 78.1* 78.0* 79.4* 77.9* 79.3*  PLT 220 217 202 225 256   Cardiac Enzymes: No results for input(s): CKTOTAL, CKMB, CKMBINDEX, TROPONINI in the last 168 hours. BNP: Invalid input(s): POCBNP CBG: Recent Labs  Lab 11/11/19 2258 11/12/19 0616 11/12/19 1056 11/12/19 1722 11/13/19 0621  GLUCAP 219* 157* 199* 269* 192*   D-Dimer No results for input(s): DDIMER in the last 72 hours. Hgb A1c No results for input(s): HGBA1C in the last 72 hours. Lipid Profile No results for input(s): CHOL, HDL, LDLCALC, TRIG, CHOLHDL, LDLDIRECT in the last 72 hours. Thyroid function studies No results for input(s): TSH, T4TOTAL, T3FREE, THYROIDAB in the last 72 hours.  Invalid input(s): FREET3 Anemia work up No results for input(s): VITAMINB12, FOLATE, FERRITIN, TIBC, IRON, RETICCTPCT in the last 72 hours. Urinalysis    Component Value Date/Time   COLORURINE YELLOW 11/08/2019 1633   APPEARANCEUR HAZY (A) 11/08/2019 1633   LABSPEC 1.014 11/08/2019 1633   PHURINE 5.0 11/08/2019 1633   GLUCOSEU NEGATIVE 11/08/2019 1633   HGBUR NEGATIVE 11/08/2019 1633    BILIRUBINUR NEGATIVE 11/08/2019 1633   KETONESUR NEGATIVE 11/08/2019 1633   PROTEINUR NEGATIVE 11/08/2019 1633   UROBILINOGEN 0.2 09/24/2014 1442   NITRITE NEGATIVE 11/08/2019 1633   LEUKOCYTESUR LARGE (A) 11/08/2019 1633   Sepsis Labs Invalid input(s): PROCALCITONIN,  WBC,  LACTICIDVEN Microbiology Recent Results (from the past 240 hour(s))  SARS Coronavirus 2  by RT PCR (hospital order, performed in [facility] lab) Nasopharyngeal Nasopharyngeal Swab     Status: None   Collection Time: 11/06/19  2:34 PM   Specimen: Nasopharyngeal Swab  Result Value Ref Range Status   SARS Coronavirus 2 NEGATIVE NEGATIVE Final    Comment: (NOTE) SARS-CoV-2 target nucleic acids are NOT DETECTED.  The SARS-CoV-2 RNA is generally detectable in upper and lower respiratory specimens during the acute phase of infection. The lowest concentration of SARS-CoV-2 viral copies this assay can detect is 250 copies / mL. A negative result does not preclude SARS-CoV-2 infection and should not be used as the sole basis for treatment or other patient management decisions.  A negative result may occur with improper specimen collection / handling, submission of specimen other than nasopharyngeal swab, presence of viral mutation(s) within the areas targeted by this assay, and inadequate number of viral copies (<250 copies / mL). A negative result must be combined with clinical observations, patient history, and epidemiological information.  Fact Sheet for Patients:   StrictlyIdeas.no  Fact Sheet for Healthcare Providers: BankingDealers.co.za  This test is not yet approved or  cleared by the [place] FDA and has been authorized for detection and/or diagnosis of SARS-CoV-2 by FDA under an Emergency Use Authorization (EUA).  This EUA will remain in effect (meaning this test can be used) for the duration of the COVID-19 declaration under Section 564(b)(1) of  the Act, 21 U.S.C. section 360bbb-3(b)(1), unless the authorization is terminated or revoked sooner.  Performed at [facility], [place] [address], [place], [place] [address]   Body fluid culture     Status: None   Collection Time: 11/07/19  1:48 PM   Specimen: Fluid  Result Value Ref Range Status   Specimen Description FLUID SYNOVIAL CYSTS  Final   Special Requests NONE  Final   Gram Stain   Final    ABUNDANT WBC PRESENT,BOTH PMN AND MONONUCLEAR RARE GRAM POSITIVE COCCI CRITICAL RESULT CALLED TO, READ BACK BY AND VERIFIED WITH: L HUYNH RN 11/07/19 1724 JDW Performed at [facility], [place] [address] [place], [place] [address]    Culture FEW STAPHYLOCOCCUS AUREUS  Final   Report Status 11/10/2019 FINAL  Final   Organism ID, Bacteria STAPHYLOCOCCUS AUREUS  Final      Susceptibility   Staphylococcus aureus - MIC*    CIPROFLOXACIN <=0.5 SENSITIVE Sensitive     ERYTHROMYCIN <=0.25 SENSITIVE Sensitive     GENTAMICIN <=0.5 SENSITIVE Sensitive     OXACILLIN <=0.25 SENSITIVE Sensitive     TETRACYCLINE <=1 SENSITIVE Sensitive     VANCOMYCIN <=0.5 SENSITIVE Sensitive     TRIMETH/SULFA <=10 SENSITIVE Sensitive     CLINDAMYCIN <=0.25 SENSITIVE Sensitive     RIFAMPIN <=0.5 SENSITIVE Sensitive     Inducible Clindamycin NEGATIVE Sensitive     * FEW STAPHYLOCOCCUS AUREUS  Culture, blood (routine x 2)     Status: None   Collection Time: 11/07/19  7:17 PM   Specimen: BLOOD  Result Value Ref Range Status   Specimen Description BLOOD RIGHT ANTECUBITAL  Final   Special Requests   Final    BOTTLES DRAWN AEROBIC AND ANAEROBIC Blood Culture adequate volume   Culture   Final    NO GROWTH 5 DAYS Performed at [facility], [place] [address] [place], [place] [address]    Report Status 11/12/2019 FINAL  Final  Culture, blood (routine x 2)     Status: None   Collection  Time: 11/07/19  7:25 PM   Specimen: BLOOD RIGHT HAND  Result Value Ref Range Status   Specimen Description BLOOD RIGHT  HAND  Final   Special Requests   Final    BOTTLES DRAWN AEROBIC ONLY Blood Culture adequate volume   Culture   Final    NO GROWTH 5 DAYS Performed at [facility], [address] [place], [place] [address]    Report Status 11/12/2019 FINAL  Final  Culture, Urine     Status: None   Collection Time: 11/12/19  4:29 AM   Specimen: Urine, Random  Result Value Ref Range Status   Specimen Description URINE, RANDOM  Final   Special Requests NONE  Final   Culture   Final    NO GROWTH Performed at [facility], [place] [address], [place], [place] [address]    Report Status 11/13/2019 FINAL  Final     Time coordinating discharge: 45 minutes  SIGNED:   Tawni Millers, MD  [facility] 11/13/2019, 9:24 AM

## 2019-11-13 NOTE — TOC Progression Note (Signed)
Transition of Care [facility]) - Progression Note    Patient Details  Name: COURAGE BIGLOW MRN: [id number] Date of Birth: 05/11/1928  Transition of Care [facility]) CM/SW [place], [place] Phone Number: 11/13/2019, 3:29 PM  Clinical Narrative:     Received insurance authorization 7/14-7/16 auth # [id number] reference # [id number], send updated clinicals to case manager Starlyn Skeans    Expected Discharge Plan: [place] Barriers to Discharge: Barriers Resolved  Expected Discharge Plan and Services Expected Discharge Plan: [place] In-house Referral: Clinical Social Work     Living arrangements for the past 2 months: Single Family Home Expected Discharge Date: 11/13/19                                     Social Determinants of Health (SDOH) Interventions    Readmission Risk Interventions No flowsheet data found.

## 2019-11-13 NOTE — Progress Notes (Signed)
Nephrology quick note --> see he has pending discharge for today.   Called to update and f/u with his son [name].  Cr further improved to 2.1 today. We discussed options and make no plans at this point for formal nephrology f/u but they're aware we are happy to have input/see anytime.  He will have close f/u with PCP. Confirmed rec'd 2nd dose of feraheme today.  Await response prior to considering ESA therapy in light of suspected RCCa.

## 2019-11-13 NOTE — Progress Notes (Signed)
Attempted to call report. Left message.

## 2019-11-13 NOTE — NC FL2 (Signed)
[place] MEDICAID FL2 LEVEL OF CARE SCREENING TOOL     IDENTIFICATION  Patient Name: [name] Birthdate: [birth year] Sex: male Admission Date (Current Location): 11/06/2019  [facility] [place] [facility] [place] Number:  Herbalist and Address:  [facility], [place] [address], [place], [place] [address]      Provider Number: [id number]  Attending Physician Name and Address:  Tawni Millers  Relative Name and Phone Number:       Current Level of Care: Hospital Recommended Level of Care: [place] Prior Approval Number:    Date Approved/Denied:   PASRR Number: [id number]  Discharge Plan: SNF    Current Diagnoses: Patient Active Problem List   Diagnosis Date Noted  . Acute on chronic diastolic CHF (congestive heart failure) ([place]) 11/06/2019  . CHF (congestive heart failure) ([place]) 11/06/2019  . Acute renal failure ([place])   . Hyperkalemia   . Fall 05/07/2017  . Alzheimer's dementia without behavioral disturbance ([place]) 10/10/2015  . Dementia ([place]) 04/22/2015  . Alzheimer's disease ([place]) 04/22/2015  . Community acquired pneumonia 02/24/2013  . Iatrogenic adrenal insufficiency ([place]) 02/24/2013  . Type II or unspecified type diabetes mellitus with neurological manifestations, not stated as uncontrolled(250.60) 01/04/2013  . Coronary artery disease 01/03/2012  . OSA (obstructive sleep apnea) 03/13/2007  . COPD (chronic obstructive pulmonary disease) ([place]) 03/13/2007    Orientation RESPIRATION BLADDER Height & Weight     Self, Time, Situation, Place  Normal External catheter, Incontinent Weight: 177 lb 14.6 oz (80.7 kg) Height:  5\' 7"  (170.2 cm)  BEHAVIORAL SYMPTOMS/MOOD NEUROLOGICAL BOWEL NUTRITION STATUS      Continent Diet (see discharge summary)  AMBULATORY STATUS COMMUNICATION OF NEEDS Skin   Supervision Verbally Surgical wounds (closed incision left elbow,ecchymosis arm,left/right hand)                        Personal Care Assistance Level of Assistance  Bathing, Dressing, Feeding Bathing Assistance: Limited assistance Feeding assistance: Independent Dressing Assistance: Limited assistance     Functional Limitations Info  Sight, Speech, Hearing Sight Info: Adequate Hearing Info: Adequate Speech Info: Adequate    SPECIAL CARE FACTORS FREQUENCY  PT (By licensed PT), OT (By licensed OT)     PT Frequency: 5x per week OT Frequency: 5x per wek            Contractures Contractures Info: Not present    Additional Factors Info  Code Status, Allergies Code Status Info: DNR Allergies Info: Ciprofloxacin Hcl,Mirabegron,Penicillins,Sulfa Antibiotics,Sulfamethoxazole,Sulfonamide Derivatives           Current Medications (11/13/2019):  This is the current hospital active medication list Current Facility-Administered Medications  Medication Dose Route Frequency Provider Last Rate Last Admin  . 0.9 %  sodium chloride infusion  250 mL Intravenous PRN Renette Butters, MD      . acetaminophen (TYLENOL) tablet 650 mg  650 mg Oral Q4H PRN Renette Butters, MD   650 mg at 11/13/19 1448  . albuterol (PROVENTIL) (2.5 MG/3ML) 0.083% nebulizer solution 3 mL  3 mL Inhalation Q6H PRN Renette Butters, MD      . amLODipine (NORVASC) tablet 10 mg  10 mg Oral Daily Edwin Dada, MD   10 mg at 11/13/19 0927  . aspirin EC tablet 81 mg  81 mg Oral Q supper Renette Butters, MD   81 mg at 11/12/19 1746  . brimonidine (ALPHAGAN) 0.15 % ophthalmic solution 1 drop  1  drop Left Eye BID Renette Butters, MD   1 drop at 11/13/19 0930  . budesonide (PULMICORT) nebulizer solution 0.25 mg  0.25 mg Nebulization BID Renette Butters, MD   0.25 mg at 11/13/19 7824  . cephALEXin (KEFLEX) capsule 500 mg  500 mg Oral Q8H Arrien, Jimmy Picket, MD   500 mg at 11/13/19 1453  . cetirizine (ZYRTEC) tablet 10 mg  10 mg Oral Daily Renette Butters, MD   10 mg at 11/13/19 0925  . cholecalciferol (VITAMIN D3)  tablet 2,000 Units  2,000 Units Oral Q supper Renette Butters, MD   2,000 Units at 11/12/19 1746  . diphenhydrAMINE-zinc acetate (BENADRYL) 2-0.1 % cream   Topical TID PRN Edwin Dada, MD   Given at 11/13/19 1412  . docusate sodium (COLACE) capsule 100 mg  100 mg Oral BID Renette Butters, MD   100 mg at 11/13/19 0925  . escitalopram (LEXAPRO) tablet 5 mg  5 mg Oral Q supper Renette Butters, MD   5 mg at 11/12/19 1746  . finasteride (PROSCAR) tablet 5 mg  5 mg Oral Q supper Renette Butters, MD   5 mg at 11/12/19 1746  . furosemide (LASIX) tablet 40 mg  40 mg Oral Daily Justin Mend, MD   40 mg at 11/13/19 0924  . heparin injection 5,000 Units  5,000 Units Subcutaneous Q12H Renette Butters, MD   5,000 Units at 11/13/19 [phone]  . insulin aspart (novoLOG) injection 0-9 Units  0-9 Units Subcutaneous TID WC Danford, Suann Larry, MD   3 Units at 11/13/19 1213  . insulin aspart (novoLOG) injection 2 Units  2 Units Subcutaneous TID WC Danford, Suann Larry, MD   2 Units at 11/13/19 1423  . ipratropium-albuterol (DUONEB) 0.5-2.5 (3) MG/3ML nebulizer solution 3 mL  3 mL Nebulization BID Renette Butters, MD   3 mL at 11/13/19 0821  . latanoprost (XALATAN) 0.005 % ophthalmic solution 1 drop  1 drop Both Eyes QHS Renette Butters, MD   1 drop at 11/12/19 2247  . linagliptin (TRADJENTA) tablet 5 mg  5 mg Oral Daily Renette Butters, MD   5 mg at 11/13/19 6144  . melatonin tablet 5 mg  5 mg Oral QHS Renette Butters, MD   5 mg at 11/12/19 2241  . memantine (NAMENDA) tablet 10 mg  10 mg Oral BID Renette Butters, MD   10 mg at 11/13/19 0926  . metoCLOPramide (REGLAN) tablet 5-10 mg  5-10 mg Oral Q8H PRN Renette Butters, MD       Or  . metoCLOPramide (REGLAN) injection 5-10 mg  5-10 mg Intravenous Q8H PRN Renette Butters, MD      . mirabegron ER [facility]) tablet 50 mg  50 mg Oral Q supper Renette Butters, MD   50 mg at 11/12/19 1746  . montelukast (SINGULAIR) tablet 10 mg  10  mg Oral Daily Renette Butters, MD   10 mg at 11/13/19 0929  . multivitamin with minerals tablet 1 tablet  1 tablet Oral Daily Renette Butters, MD   1 tablet at 11/13/19 [phone]  . ondansetron (ZOFRAN) tablet 4 mg  4 mg Oral Q6H PRN Renette Butters, MD       Or  . ondansetron [facility]) injection 4 mg  4 mg Intravenous Q6H PRN Renette Butters, MD      . pantoprazole (PROTONIX) EC tablet 40 mg  40 mg Oral BID Percell Miller,  Ernesta Amble, MD   40 mg at 11/13/19 0923  . predniSONE (DELTASONE) tablet 7.5 mg  7.5 mg Oral QAC breakfast Renette Butters, MD   7.5 mg at 11/13/19 2904  . sodium chloride flush (NS) 0.9 % injection 3 mL  3 mL Intravenous Q12H Renette Butters, MD   3 mL at 11/13/19 1031  . sodium chloride flush (NS) 0.9 % injection 3 mL  3 mL Intravenous PRN Renette Butters, MD         Discharge Medications: Please see discharge summary for a list of discharge medications.  Relevant Imaging Results:  Relevant Lab Results:   Additional Information SSN [id number]   uses C-pap at night, family will bring his from home  Vinie Sill, [place]

## 2019-11-13 NOTE — Progress Notes (Signed)
Patient bumped left elbow while getting back to bed. Site immediately started to bleed. Very slight "tear" lateral to incision. Bleeding stopped and elbow redressed. Patient reposition in bed, tylenol given. Patient resting at this time

## 2019-11-13 NOTE — Progress Notes (Signed)
Subjective:  Left arm pain is better. States breathing is better.   Intake/Output from previous day:  I/O last 3 completed shifts: In: 840 [P.O.:840] Out: 650 [Urine:650] Total I/O In: 240 [P.O.:240] Out: 450 [Urine:450]  Blood pressure (!) 159/65, pulse (!) 54, temperature (!) 97.5 F (36.4 C), temperature source Oral, resp. rate 18, height 5' 7"  (1.702 m), weight 80.7 kg, SpO2 99 %. Physical Exam Constitutional:      General: He is not in acute distress.    Appearance: He is not ill-appearing.  Neck:     Vascular: No JVD.  Cardiovascular:     Rate and Rhythm: Normal rate and regular rhythm.     Pulses:          Dorsalis pedis pulses are 1+ on the right side and 1+ on the left side.       Posterior tibial pulses are 1+ on the right side and 1+ on the left side.     Heart sounds: Normal heart sounds. No gallop.   Pulmonary:     Breath sounds: Examination of the right-lower field reveals decreased breath sounds. Examination of the left-lower field reveals decreased breath sounds. Decreased breath sounds and rales (Bilateral diffuse expiratory and inspiratory rales) present.  Musculoskeletal:     Cervical back: Normal range of motion.    Lab Results: BNP (last 3 results) Recent Labs    11/07/19 0241 11/08/19 0306 11/09/19 0304  BNP 377.8* 189.6* 149.5*    ProBNP (last 3 results) No results for input(s): PROBNP in the last 8760 hours. BMP Latest Ref Rng & Units 11/12/2019 11/11/2019 11/10/2019  Glucose 70 - 99 mg/dL 189(H) 111(H) 161(H)  BUN 8 - 23 mg/dL 47(H) 39(H) 44(H)  Creatinine 0.61 - 1.24 mg/dL 2.42(H) 2.60(H) 2.79(H)  Sodium 135 - 145 mmol/L 138 140 141  Potassium 3.5 - 5.1 mmol/L 4.6 4.3 3.7  Chloride 98 - 111 mmol/L 103 105 104  CO2 22 - 32 mmol/L 25 25 25   Calcium 8.9 - 10.3 mg/dL 8.5(L) 8.3(L) 8.6(L)   Hepatic Function Latest Ref Rng & Units 11/12/2019 11/11/2019 11/10/2019  Total Protein 6.5 - 8.1 g/dL - - -  Albumin 3.5 - 5.0 g/dL 2.2(L) 2.0(L) 2.5(L)   AST 15 - 41 U/L - - -  ALT 17 - 63 U/L - - -  Alk Phosphatase 38 - 126 U/L - - -  Total Bilirubin 0.3 - 1.2 mg/dL - - -  Bilirubin, Direct 0.0 - 0.3 mg/dL - - -   CBC Latest Ref Rng & Units 11/13/2019 11/12/2019 11/11/2019  WBC 4.0 - 10.5 K/uL 13.0(H) 13.6(H) 11.6(H)  Hemoglobin 13.0 - 17.0 g/dL 8.0(L) 7.5(L) 7.5(L)  Hematocrit 39 - 52 % 26.0(L) 23.9(L) 24.3(L)  Platelets 150 - 400 K/uL 256 225 202   Lipid Panel     Component Value Date/Time   CHOL 109 01/03/2012 1305   TRIG 44.0 01/03/2012 1305   HDL 60.20 01/03/2012 1305   CHOLHDL 2 01/03/2012 1305   VLDL 8.8 01/03/2012 1305   LDLCALC 40 01/03/2012 1305   Cardiac Panel (last 3 results) No results for input(s): CKTOTAL, CKMB, TROPONINI, RELINDX in the last 72 hours.  HEMOGLOBIN A1C Lab Results  Component Value Date   HGBA1C 9.4 (H) 11/07/2019   MPG 223.08 11/07/2019   TSH Recent Labs    11/07/19 0241  TSH 3.370   Imaging: No results found.  Scheduled Meds: . amLODipine  10 mg Oral Daily  . aspirin EC  81 mg Oral  Q supper  . brimonidine  1 drop Left Eye BID  . budesonide (PULMICORT) nebulizer solution  0.25 mg Nebulization BID  . cephALEXin  500 mg Oral Q8H  . cetirizine  10 mg Oral Daily  . cholecalciferol  2,000 Units Oral Q supper  . docusate sodium  100 mg Oral BID  . escitalopram  5 mg Oral Q supper  . finasteride  5 mg Oral Q supper  . furosemide  40 mg Oral Daily  . heparin injection (subcutaneous)  5,000 Units Subcutaneous Q12H  . insulin aspart  0-9 Units Subcutaneous TID WC  . insulin aspart  2 Units Subcutaneous TID WC  . ipratropium-albuterol  3 mL Nebulization BID  . latanoprost  1 drop Both Eyes QHS  . linagliptin  5 mg Oral Daily  . melatonin  5 mg Oral QHS  . memantine  10 mg Oral BID  . mirabegron ER  50 mg Oral Q supper  . montelukast  10 mg Oral Daily  . multivitamin with minerals  1 tablet Oral Daily  . pantoprazole  40 mg Oral BID  . predniSONE  7.5 mg Oral QAC breakfast  . sodium  chloride flush  3 mL Intravenous Q12H   Continuous Infusions: . sodium chloride    . ferumoxytol     PRN Meds:.sodium chloride, acetaminophen, albuterol, diphenhydrAMINE-zinc acetate, metoCLOPramide **OR** metoCLOPramide (REGLAN) injection, ondansetron **OR** ondansetron (ZOFRAN) IV, sodium chloride flush   Cardiac Studies:    Echocardiogram 05/08/2017: - Left ventricle: There was mild concentric hypertrophy. Systolic  function was normal. The estimated ejection fraction was in the  range of 55% to 60%. Wall motion was normal; there were no  regional wall motion abnormalities. Doppler parameters are  consistent with abnormal left ventricular relaxation (grade 1 astolic dysfunction). Doppler parameters are consistent with  both elevated ventricular end-diastolic filling pressure and  elevated left atrial filling pressure.  - Mitral valve: Calcified annulus.  - Left atrium: The atrium was mildly dilated.  - Atrial septum: No defect or patent foramen ovale was identified.  EKG:  EKG 11/06/2019 at 11: 11 hours Marked sinus bradycardia at rate of 49 bpm, normal axis, normal QT interval.  No evident ischemia.   Tele: NSR  Assessment/Plan:  1.  Acute on chronic diastolic CHF 2. Acute on chronic stage 4 CKD.  3. Left upper extremity edema and pain, due to olecranon bursitis, infected. Now on antibiotics 4. COPD exacerbation  Recommendation: He was actively wheezing today and just received albuterol therapy. I am also concerned about CHF recurrence, will check BNP today. May need IV furosemide. UOP is good. Renal function has improved from pre-admit level. Question is whether the active wheezing is COPD vs CHF.    Adrian Prows, MD, [facility] 11/13/2019, 9:17 AM Office: [phone]

## 2019-11-14 DIAGNOSIS — D649 Anemia, unspecified: Secondary | ICD-10-CM | POA: Diagnosis not present

## 2019-11-14 DIAGNOSIS — I10 Essential (primary) hypertension: Secondary | ICD-10-CM | POA: Diagnosis not present

## 2019-11-14 DIAGNOSIS — E875 Hyperkalemia: Secondary | ICD-10-CM | POA: Diagnosis not present

## 2019-11-14 DIAGNOSIS — I2510 Atherosclerotic heart disease of native coronary artery without angina pectoris: Secondary | ICD-10-CM | POA: Diagnosis not present

## 2019-11-14 DIAGNOSIS — N179 Acute kidney failure, unspecified: Secondary | ICD-10-CM | POA: Diagnosis not present

## 2019-11-14 DIAGNOSIS — E119 Type 2 diabetes mellitus without complications: Secondary | ICD-10-CM | POA: Diagnosis not present

## 2019-11-14 DIAGNOSIS — I5042 Chronic combined systolic (congestive) and diastolic (congestive) heart failure: Secondary | ICD-10-CM | POA: Diagnosis not present

## 2019-11-14 DIAGNOSIS — E782 Mixed hyperlipidemia: Secondary | ICD-10-CM | POA: Diagnosis not present

## 2019-11-14 DIAGNOSIS — F0390 Unspecified dementia without behavioral disturbance: Secondary | ICD-10-CM | POA: Diagnosis not present

## 2019-11-15 DIAGNOSIS — N183 Chronic kidney disease, stage 3 unspecified: Secondary | ICD-10-CM | POA: Diagnosis not present

## 2019-11-15 DIAGNOSIS — N179 Acute kidney failure, unspecified: Secondary | ICD-10-CM | POA: Diagnosis not present

## 2019-11-15 DIAGNOSIS — J9601 Acute respiratory failure with hypoxia: Secondary | ICD-10-CM | POA: Diagnosis not present

## 2019-11-15 DIAGNOSIS — I5033 Acute on chronic diastolic (congestive) heart failure: Secondary | ICD-10-CM | POA: Diagnosis not present

## 2019-11-17 DIAGNOSIS — F0390 Unspecified dementia without behavioral disturbance: Secondary | ICD-10-CM | POA: Diagnosis not present

## 2019-11-17 DIAGNOSIS — I2510 Atherosclerotic heart disease of native coronary artery without angina pectoris: Secondary | ICD-10-CM | POA: Diagnosis not present

## 2019-11-17 DIAGNOSIS — H409 Unspecified glaucoma: Secondary | ICD-10-CM | POA: Diagnosis not present

## 2019-11-17 DIAGNOSIS — K219 Gastro-esophageal reflux disease without esophagitis: Secondary | ICD-10-CM | POA: Diagnosis not present

## 2019-11-17 DIAGNOSIS — N400 Enlarged prostate without lower urinary tract symptoms: Secondary | ICD-10-CM | POA: Diagnosis not present

## 2019-11-17 DIAGNOSIS — J449 Chronic obstructive pulmonary disease, unspecified: Secondary | ICD-10-CM | POA: Diagnosis not present

## 2019-11-17 DIAGNOSIS — I5042 Chronic combined systolic (congestive) and diastolic (congestive) heart failure: Secondary | ICD-10-CM | POA: Diagnosis not present

## 2019-11-17 DIAGNOSIS — I10 Essential (primary) hypertension: Secondary | ICD-10-CM | POA: Diagnosis not present

## 2019-11-18 DIAGNOSIS — N179 Acute kidney failure, unspecified: Secondary | ICD-10-CM | POA: Diagnosis not present

## 2019-11-18 DIAGNOSIS — I2510 Atherosclerotic heart disease of native coronary artery without angina pectoris: Secondary | ICD-10-CM | POA: Diagnosis not present

## 2019-11-18 DIAGNOSIS — I10 Essential (primary) hypertension: Secondary | ICD-10-CM | POA: Diagnosis not present

## 2019-11-18 DIAGNOSIS — E782 Mixed hyperlipidemia: Secondary | ICD-10-CM | POA: Diagnosis not present

## 2019-11-18 DIAGNOSIS — I5042 Chronic combined systolic (congestive) and diastolic (congestive) heart failure: Secondary | ICD-10-CM | POA: Diagnosis not present

## 2019-11-18 DIAGNOSIS — F0390 Unspecified dementia without behavioral disturbance: Secondary | ICD-10-CM | POA: Diagnosis not present

## 2019-11-18 DIAGNOSIS — E119 Type 2 diabetes mellitus without complications: Secondary | ICD-10-CM | POA: Diagnosis not present

## 2019-11-18 DIAGNOSIS — H409 Unspecified glaucoma: Secondary | ICD-10-CM | POA: Diagnosis not present

## 2019-11-18 DIAGNOSIS — D649 Anemia, unspecified: Secondary | ICD-10-CM | POA: Diagnosis not present

## 2019-11-20 DIAGNOSIS — J449 Chronic obstructive pulmonary disease, unspecified: Secondary | ICD-10-CM | POA: Diagnosis not present

## 2019-11-20 DIAGNOSIS — I5042 Chronic combined systolic (congestive) and diastolic (congestive) heart failure: Secondary | ICD-10-CM | POA: Diagnosis not present

## 2019-11-20 DIAGNOSIS — I10 Essential (primary) hypertension: Secondary | ICD-10-CM | POA: Diagnosis not present

## 2019-11-20 DIAGNOSIS — F0390 Unspecified dementia without behavioral disturbance: Secondary | ICD-10-CM | POA: Diagnosis not present

## 2019-11-20 DIAGNOSIS — N179 Acute kidney failure, unspecified: Secondary | ICD-10-CM | POA: Diagnosis not present

## 2019-11-20 DIAGNOSIS — E119 Type 2 diabetes mellitus without complications: Secondary | ICD-10-CM | POA: Diagnosis not present

## 2019-11-20 DIAGNOSIS — D649 Anemia, unspecified: Secondary | ICD-10-CM | POA: Diagnosis not present

## 2019-11-20 DIAGNOSIS — E875 Hyperkalemia: Secondary | ICD-10-CM | POA: Diagnosis not present

## 2019-11-21 DIAGNOSIS — N179 Acute kidney failure, unspecified: Secondary | ICD-10-CM | POA: Diagnosis not present

## 2019-11-21 DIAGNOSIS — M71122 Other infective bursitis, left elbow: Secondary | ICD-10-CM | POA: Diagnosis not present

## 2019-11-21 DIAGNOSIS — I5042 Chronic combined systolic (congestive) and diastolic (congestive) heart failure: Secondary | ICD-10-CM | POA: Diagnosis not present

## 2019-11-21 DIAGNOSIS — F0390 Unspecified dementia without behavioral disturbance: Secondary | ICD-10-CM | POA: Diagnosis not present

## 2019-11-21 DIAGNOSIS — D649 Anemia, unspecified: Secondary | ICD-10-CM | POA: Diagnosis not present

## 2019-11-21 DIAGNOSIS — I10 Essential (primary) hypertension: Secondary | ICD-10-CM | POA: Diagnosis not present

## 2019-11-21 DIAGNOSIS — J449 Chronic obstructive pulmonary disease, unspecified: Secondary | ICD-10-CM | POA: Diagnosis not present

## 2019-11-21 DIAGNOSIS — E782 Mixed hyperlipidemia: Secondary | ICD-10-CM | POA: Diagnosis not present

## 2019-11-21 DIAGNOSIS — E119 Type 2 diabetes mellitus without complications: Secondary | ICD-10-CM | POA: Diagnosis not present

## 2019-11-22 DIAGNOSIS — I5042 Chronic combined systolic (congestive) and diastolic (congestive) heart failure: Secondary | ICD-10-CM | POA: Diagnosis not present

## 2019-11-22 DIAGNOSIS — I10 Essential (primary) hypertension: Secondary | ICD-10-CM | POA: Diagnosis not present

## 2019-11-22 DIAGNOSIS — N179 Acute kidney failure, unspecified: Secondary | ICD-10-CM | POA: Diagnosis not present

## 2019-11-22 DIAGNOSIS — D649 Anemia, unspecified: Secondary | ICD-10-CM | POA: Diagnosis not present

## 2019-11-22 DIAGNOSIS — M71122 Other infective bursitis, left elbow: Secondary | ICD-10-CM | POA: Diagnosis not present

## 2019-11-22 DIAGNOSIS — J449 Chronic obstructive pulmonary disease, unspecified: Secondary | ICD-10-CM | POA: Diagnosis not present

## 2019-11-22 DIAGNOSIS — I2510 Atherosclerotic heart disease of native coronary artery without angina pectoris: Secondary | ICD-10-CM | POA: Diagnosis not present

## 2019-11-22 DIAGNOSIS — E782 Mixed hyperlipidemia: Secondary | ICD-10-CM | POA: Diagnosis not present

## 2019-11-22 DIAGNOSIS — F0390 Unspecified dementia without behavioral disturbance: Secondary | ICD-10-CM | POA: Diagnosis not present

## 2019-11-28 DIAGNOSIS — I10 Essential (primary) hypertension: Secondary | ICD-10-CM | POA: Diagnosis not present

## 2019-11-28 DIAGNOSIS — I5042 Chronic combined systolic (congestive) and diastolic (congestive) heart failure: Secondary | ICD-10-CM | POA: Diagnosis not present

## 2019-11-28 DIAGNOSIS — E119 Type 2 diabetes mellitus without complications: Secondary | ICD-10-CM | POA: Diagnosis not present

## 2019-11-28 DIAGNOSIS — N179 Acute kidney failure, unspecified: Secondary | ICD-10-CM | POA: Diagnosis not present

## 2019-11-28 DIAGNOSIS — F0390 Unspecified dementia without behavioral disturbance: Secondary | ICD-10-CM | POA: Diagnosis not present

## 2019-11-28 DIAGNOSIS — D649 Anemia, unspecified: Secondary | ICD-10-CM | POA: Diagnosis not present

## 2019-11-28 DIAGNOSIS — E875 Hyperkalemia: Secondary | ICD-10-CM | POA: Diagnosis not present

## 2019-11-28 DIAGNOSIS — E782 Mixed hyperlipidemia: Secondary | ICD-10-CM | POA: Diagnosis not present

## 2019-11-28 DIAGNOSIS — J449 Chronic obstructive pulmonary disease, unspecified: Secondary | ICD-10-CM | POA: Diagnosis not present

## 2019-11-29 DIAGNOSIS — E782 Mixed hyperlipidemia: Secondary | ICD-10-CM | POA: Diagnosis not present

## 2019-11-29 DIAGNOSIS — I10 Essential (primary) hypertension: Secondary | ICD-10-CM | POA: Diagnosis not present

## 2019-11-29 DIAGNOSIS — M71122 Other infective bursitis, left elbow: Secondary | ICD-10-CM | POA: Diagnosis not present

## 2019-11-29 DIAGNOSIS — J449 Chronic obstructive pulmonary disease, unspecified: Secondary | ICD-10-CM | POA: Diagnosis not present

## 2019-11-29 DIAGNOSIS — I5042 Chronic combined systolic (congestive) and diastolic (congestive) heart failure: Secondary | ICD-10-CM | POA: Diagnosis not present

## 2019-11-29 DIAGNOSIS — D649 Anemia, unspecified: Secondary | ICD-10-CM | POA: Diagnosis not present

## 2019-11-29 DIAGNOSIS — E119 Type 2 diabetes mellitus without complications: Secondary | ICD-10-CM | POA: Diagnosis not present

## 2019-11-29 DIAGNOSIS — N179 Acute kidney failure, unspecified: Secondary | ICD-10-CM | POA: Diagnosis not present

## 2019-11-29 DIAGNOSIS — F0390 Unspecified dementia without behavioral disturbance: Secondary | ICD-10-CM | POA: Diagnosis not present

## 2019-12-11 DIAGNOSIS — Z20818 Contact with and (suspected) exposure to other bacterial communicable diseases: Secondary | ICD-10-CM | POA: Diagnosis not present

## 2019-12-14 DIAGNOSIS — N183 Chronic kidney disease, stage 3 unspecified: Secondary | ICD-10-CM | POA: Diagnosis not present

## 2019-12-14 DIAGNOSIS — I2510 Atherosclerotic heart disease of native coronary artery without angina pectoris: Secondary | ICD-10-CM | POA: Diagnosis not present

## 2019-12-14 DIAGNOSIS — F0390 Unspecified dementia without behavioral disturbance: Secondary | ICD-10-CM | POA: Diagnosis not present

## 2019-12-14 DIAGNOSIS — D509 Iron deficiency anemia, unspecified: Secondary | ICD-10-CM | POA: Diagnosis not present

## 2019-12-14 DIAGNOSIS — I5022 Chronic systolic (congestive) heart failure: Secondary | ICD-10-CM | POA: Diagnosis not present

## 2019-12-14 DIAGNOSIS — E1122 Type 2 diabetes mellitus with diabetic chronic kidney disease: Secondary | ICD-10-CM | POA: Diagnosis not present

## 2019-12-14 DIAGNOSIS — I5033 Acute on chronic diastolic (congestive) heart failure: Secondary | ICD-10-CM | POA: Diagnosis not present

## 2019-12-14 DIAGNOSIS — I110 Hypertensive heart disease with heart failure: Secondary | ICD-10-CM | POA: Diagnosis not present

## 2019-12-14 DIAGNOSIS — J449 Chronic obstructive pulmonary disease, unspecified: Secondary | ICD-10-CM | POA: Diagnosis not present

## 2019-12-16 DIAGNOSIS — F0390 Unspecified dementia without behavioral disturbance: Secondary | ICD-10-CM | POA: Diagnosis not present

## 2019-12-16 DIAGNOSIS — N183 Chronic kidney disease, stage 3 unspecified: Secondary | ICD-10-CM | POA: Diagnosis not present

## 2019-12-16 DIAGNOSIS — I2510 Atherosclerotic heart disease of native coronary artery without angina pectoris: Secondary | ICD-10-CM | POA: Diagnosis not present

## 2019-12-16 DIAGNOSIS — I5022 Chronic systolic (congestive) heart failure: Secondary | ICD-10-CM | POA: Diagnosis not present

## 2019-12-16 DIAGNOSIS — J449 Chronic obstructive pulmonary disease, unspecified: Secondary | ICD-10-CM | POA: Diagnosis not present

## 2019-12-16 DIAGNOSIS — D509 Iron deficiency anemia, unspecified: Secondary | ICD-10-CM | POA: Diagnosis not present

## 2019-12-16 DIAGNOSIS — I110 Hypertensive heart disease with heart failure: Secondary | ICD-10-CM | POA: Diagnosis not present

## 2019-12-16 DIAGNOSIS — I5033 Acute on chronic diastolic (congestive) heart failure: Secondary | ICD-10-CM | POA: Diagnosis not present

## 2019-12-16 DIAGNOSIS — E1122 Type 2 diabetes mellitus with diabetic chronic kidney disease: Secondary | ICD-10-CM | POA: Diagnosis not present

## 2019-12-20 DIAGNOSIS — I5022 Chronic systolic (congestive) heart failure: Secondary | ICD-10-CM | POA: Diagnosis not present

## 2019-12-20 DIAGNOSIS — G4733 Obstructive sleep apnea (adult) (pediatric): Secondary | ICD-10-CM | POA: Diagnosis not present

## 2019-12-20 DIAGNOSIS — D508 Other iron deficiency anemias: Secondary | ICD-10-CM | POA: Diagnosis not present

## 2019-12-20 DIAGNOSIS — N179 Acute kidney failure, unspecified: Secondary | ICD-10-CM | POA: Diagnosis not present

## 2019-12-20 DIAGNOSIS — I2510 Atherosclerotic heart disease of native coronary artery without angina pectoris: Secondary | ICD-10-CM | POA: Diagnosis not present

## 2019-12-20 DIAGNOSIS — I5033 Acute on chronic diastolic (congestive) heart failure: Secondary | ICD-10-CM | POA: Diagnosis not present

## 2019-12-20 DIAGNOSIS — G309 Alzheimer's disease, unspecified: Secondary | ICD-10-CM | POA: Diagnosis not present

## 2019-12-20 DIAGNOSIS — N183 Chronic kidney disease, stage 3 unspecified: Secondary | ICD-10-CM | POA: Diagnosis not present

## 2019-12-20 DIAGNOSIS — N1831 Chronic kidney disease, stage 3a: Secondary | ICD-10-CM | POA: Diagnosis not present

## 2019-12-20 DIAGNOSIS — J449 Chronic obstructive pulmonary disease, unspecified: Secondary | ICD-10-CM | POA: Diagnosis not present

## 2019-12-20 DIAGNOSIS — D509 Iron deficiency anemia, unspecified: Secondary | ICD-10-CM | POA: Diagnosis not present

## 2019-12-20 DIAGNOSIS — M7022 Olecranon bursitis, left elbow: Secondary | ICD-10-CM | POA: Diagnosis not present

## 2019-12-20 DIAGNOSIS — D72829 Elevated white blood cell count, unspecified: Secondary | ICD-10-CM | POA: Diagnosis not present

## 2019-12-20 DIAGNOSIS — I130 Hypertensive heart and chronic kidney disease with heart failure and stage 1 through stage 4 chronic kidney disease, or unspecified chronic kidney disease: Secondary | ICD-10-CM | POA: Diagnosis not present

## 2019-12-20 DIAGNOSIS — F0390 Unspecified dementia without behavioral disturbance: Secondary | ICD-10-CM | POA: Diagnosis not present

## 2019-12-20 DIAGNOSIS — I110 Hypertensive heart disease with heart failure: Secondary | ICD-10-CM | POA: Diagnosis not present

## 2019-12-20 DIAGNOSIS — E1122 Type 2 diabetes mellitus with diabetic chronic kidney disease: Secondary | ICD-10-CM | POA: Diagnosis not present

## 2019-12-22 ENCOUNTER — Other Ambulatory Visit: Payer: Self-pay | Admitting: Physician Assistant

## 2019-12-22 DIAGNOSIS — E119 Type 2 diabetes mellitus without complications: Secondary | ICD-10-CM

## 2019-12-22 DIAGNOSIS — J449 Chronic obstructive pulmonary disease, unspecified: Secondary | ICD-10-CM

## 2019-12-22 DIAGNOSIS — U071 COVID-19: Secondary | ICD-10-CM

## 2019-12-22 DIAGNOSIS — N184 Chronic kidney disease, stage 4 (severe): Secondary | ICD-10-CM

## 2019-12-22 NOTE — Progress Notes (Signed)
I connected by phone with [name] on 12/22/2019 at 2:43 PM to discuss the potential use of a new treatment for mild to moderate COVID-19 viral infection in non-hospitalized patients.  This patient is a 86 y.o. male that meets the FDA criteria for Emergency Use Authorization of COVID monoclonal antibody casirivimab/imdevimab.  Has a (+) direct SARS-CoV-2 viral test result  Has mild or moderate COVID-19   Is NOT hospitalized due to COVID-19  Is within 10 days of symptom onset  Has at least one of the high risk factor(s) for progression to severe COVID-19 and/or hospitalization as defined in EUA.  Specific high risk criteria : Chronic Kidney Disease (CKD), COPD, DM, CHF   I have spoken and communicated the following to the patient or parent/caregiver regarding COVID monoclonal antibody treatment:  1. FDA has authorized the emergency use for the treatment of mild to moderate COVID-19 in adults and pediatric patients with positive results of direct SARS-CoV-2 viral testing who are 29 years of age and older weighing at least 40 kg, and who are at high risk for progressing to severe COVID-19 and/or hospitalization.  2. The significant known and potential risks and benefits of COVID monoclonal antibody, and the extent to which such potential risks and benefits are unknown.  3. Information on available alternative treatments and the risks and benefits of those alternatives, including clinical trials.  4. Patients treated with COVID monoclonal antibody should continue to self-isolate and use infection control measures (e.g., wear mask, isolate, social distance, avoid sharing personal items, clean and disinfect "high touch" surfaces, and frequent handwashing) according to CDC guidelines.   5. The patient or parent/caregiver has the option to accept or refuse COVID monoclonal antibody treatment.  After reviewing this information with the patient, The patient agreed to proceed with receiving  casirivimab\imdevimab infusion and will be provided a copy of the Fact sheet prior to receiving the infusion.   [name] 12/22/2019 2:43 PM

## 2019-12-23 ENCOUNTER — Ambulatory Visit (HOSPITAL_COMMUNITY)
Admission: RE | Admit: 2019-12-23 | Discharge: 2019-12-23 | Disposition: A | Discharge status: Home / Self Care | Payer: Medicare Other | Source: Ambulatory Visit | Attending: Pulmonary Disease | Admitting: Pulmonary Disease

## 2019-12-23 DIAGNOSIS — U071 COVID-19: Secondary | ICD-10-CM

## 2019-12-23 DIAGNOSIS — Z23 Encounter for immunization: Secondary | ICD-10-CM | POA: Insufficient documentation

## 2019-12-23 MED ORDER — EPINEPHRINE 0.3 MG/0.3ML IJ SOAJ: 0.3000 mg | Freq: Once | INTRAMUSCULAR | Status: DC | PRN

## 2019-12-23 MED ORDER — SODIUM CHLORIDE 0.9 % IV SOLN: INTRAVENOUS | Status: DC | PRN

## 2019-12-23 MED ORDER — METHYLPREDNISOLONE SODIUM SUCC 125 MG IJ SOLR: 125.0000 mg | Freq: Once | INTRAMUSCULAR | Status: DC | PRN

## 2019-12-23 MED ORDER — SODIUM CHLORIDE 0.9 % IV SOLN
1200.0000 mg | Freq: Once | INTRAVENOUS | Status: AC
  Administered 2019-12-23: 1200 mg via INTRAVENOUS
  Filled 2019-12-23: qty 10

## 2019-12-23 MED ORDER — FAMOTIDINE IN NACL 20-0.9 MG/50ML-% IV SOLN: 20.0000 mg | Freq: Once | INTRAVENOUS | Status: DC | PRN

## 2019-12-23 MED ORDER — ALBUTEROL SULFATE HFA 108 (90 BASE) MCG/ACT IN AERS: 2.0000 | INHALATION_SPRAY | Freq: Once | RESPIRATORY_TRACT | Status: DC | PRN

## 2019-12-23 MED ORDER — DIPHENHYDRAMINE HCL 50 MG/ML IJ SOLN: 50.0000 mg | Freq: Once | INTRAMUSCULAR | Status: DC | PRN

## 2019-12-23 NOTE — Progress Notes (Signed)
  Diagnosis: COVID-19  Physician: Dr Tia Masker  Procedure: [facility] Med: casirivimab\imdevimab infusion - Provided patient with casirivimab\imdevimab fact sheet for patients, parents and caregivers prior to infusion.  Complications: No immediate complications noted.  Discharge: Discharged home   Scotty Court 12/23/2019

## 2019-12-23 NOTE — Discharge Instructions (Signed)

## 2019-12-30 DIAGNOSIS — I2510 Atherosclerotic heart disease of native coronary artery without angina pectoris: Secondary | ICD-10-CM | POA: Diagnosis not present

## 2019-12-30 DIAGNOSIS — F0390 Unspecified dementia without behavioral disturbance: Secondary | ICD-10-CM | POA: Diagnosis not present

## 2019-12-30 DIAGNOSIS — I5042 Chronic combined systolic (congestive) and diastolic (congestive) heart failure: Secondary | ICD-10-CM | POA: Diagnosis not present

## 2019-12-30 DIAGNOSIS — J449 Chronic obstructive pulmonary disease, unspecified: Secondary | ICD-10-CM | POA: Diagnosis not present

## 2020-01-03 DIAGNOSIS — E782 Mixed hyperlipidemia: Secondary | ICD-10-CM | POA: Diagnosis not present

## 2020-01-10 DIAGNOSIS — F0390 Unspecified dementia without behavioral disturbance: Secondary | ICD-10-CM | POA: Diagnosis not present

## 2020-01-10 DIAGNOSIS — I110 Hypertensive heart disease with heart failure: Secondary | ICD-10-CM | POA: Diagnosis not present

## 2020-01-10 DIAGNOSIS — E1122 Type 2 diabetes mellitus with diabetic chronic kidney disease: Secondary | ICD-10-CM | POA: Diagnosis not present

## 2020-01-10 DIAGNOSIS — I2510 Atherosclerotic heart disease of native coronary artery without angina pectoris: Secondary | ICD-10-CM | POA: Diagnosis not present

## 2020-01-10 DIAGNOSIS — J449 Chronic obstructive pulmonary disease, unspecified: Secondary | ICD-10-CM | POA: Diagnosis not present

## 2020-01-10 DIAGNOSIS — D509 Iron deficiency anemia, unspecified: Secondary | ICD-10-CM | POA: Diagnosis not present

## 2020-01-10 DIAGNOSIS — I5033 Acute on chronic diastolic (congestive) heart failure: Secondary | ICD-10-CM | POA: Diagnosis not present

## 2020-01-10 DIAGNOSIS — N183 Chronic kidney disease, stage 3 unspecified: Secondary | ICD-10-CM | POA: Diagnosis not present

## 2020-01-10 DIAGNOSIS — I5022 Chronic systolic (congestive) heart failure: Secondary | ICD-10-CM | POA: Diagnosis not present

## 2020-01-13 DIAGNOSIS — E1122 Type 2 diabetes mellitus with diabetic chronic kidney disease: Secondary | ICD-10-CM | POA: Diagnosis not present

## 2020-01-13 DIAGNOSIS — I5033 Acute on chronic diastolic (congestive) heart failure: Secondary | ICD-10-CM | POA: Diagnosis not present

## 2020-01-13 DIAGNOSIS — N183 Chronic kidney disease, stage 3 unspecified: Secondary | ICD-10-CM | POA: Diagnosis not present

## 2020-01-13 DIAGNOSIS — D509 Iron deficiency anemia, unspecified: Secondary | ICD-10-CM | POA: Diagnosis not present

## 2020-01-13 DIAGNOSIS — I5022 Chronic systolic (congestive) heart failure: Secondary | ICD-10-CM | POA: Diagnosis not present

## 2020-01-13 DIAGNOSIS — I110 Hypertensive heart disease with heart failure: Secondary | ICD-10-CM | POA: Diagnosis not present

## 2020-01-13 DIAGNOSIS — F0390 Unspecified dementia without behavioral disturbance: Secondary | ICD-10-CM | POA: Diagnosis not present

## 2020-01-13 DIAGNOSIS — I2510 Atherosclerotic heart disease of native coronary artery without angina pectoris: Secondary | ICD-10-CM | POA: Diagnosis not present

## 2020-01-13 DIAGNOSIS — J449 Chronic obstructive pulmonary disease, unspecified: Secondary | ICD-10-CM | POA: Diagnosis not present

## 2020-01-17 DIAGNOSIS — I5022 Chronic systolic (congestive) heart failure: Secondary | ICD-10-CM | POA: Diagnosis not present

## 2020-01-17 DIAGNOSIS — I5033 Acute on chronic diastolic (congestive) heart failure: Secondary | ICD-10-CM | POA: Diagnosis not present

## 2020-01-17 DIAGNOSIS — N183 Chronic kidney disease, stage 3 unspecified: Secondary | ICD-10-CM | POA: Diagnosis not present

## 2020-01-17 DIAGNOSIS — I2510 Atherosclerotic heart disease of native coronary artery without angina pectoris: Secondary | ICD-10-CM | POA: Diagnosis not present

## 2020-01-17 DIAGNOSIS — F0390 Unspecified dementia without behavioral disturbance: Secondary | ICD-10-CM | POA: Diagnosis not present

## 2020-01-17 DIAGNOSIS — E1122 Type 2 diabetes mellitus with diabetic chronic kidney disease: Secondary | ICD-10-CM | POA: Diagnosis not present

## 2020-01-17 DIAGNOSIS — D509 Iron deficiency anemia, unspecified: Secondary | ICD-10-CM | POA: Diagnosis not present

## 2020-01-17 DIAGNOSIS — I110 Hypertensive heart disease with heart failure: Secondary | ICD-10-CM | POA: Diagnosis not present

## 2020-01-17 DIAGNOSIS — J449 Chronic obstructive pulmonary disease, unspecified: Secondary | ICD-10-CM | POA: Diagnosis not present

## 2020-01-18 DIAGNOSIS — E1122 Type 2 diabetes mellitus with diabetic chronic kidney disease: Secondary | ICD-10-CM | POA: Diagnosis not present

## 2020-01-18 DIAGNOSIS — N183 Chronic kidney disease, stage 3 unspecified: Secondary | ICD-10-CM | POA: Diagnosis not present

## 2020-01-18 DIAGNOSIS — I5033 Acute on chronic diastolic (congestive) heart failure: Secondary | ICD-10-CM | POA: Diagnosis not present

## 2020-01-18 DIAGNOSIS — I2510 Atherosclerotic heart disease of native coronary artery without angina pectoris: Secondary | ICD-10-CM | POA: Diagnosis not present

## 2020-01-18 DIAGNOSIS — J449 Chronic obstructive pulmonary disease, unspecified: Secondary | ICD-10-CM | POA: Diagnosis not present

## 2020-01-18 DIAGNOSIS — D509 Iron deficiency anemia, unspecified: Secondary | ICD-10-CM | POA: Diagnosis not present

## 2020-01-18 DIAGNOSIS — I5022 Chronic systolic (congestive) heart failure: Secondary | ICD-10-CM | POA: Diagnosis not present

## 2020-01-18 DIAGNOSIS — I110 Hypertensive heart disease with heart failure: Secondary | ICD-10-CM | POA: Diagnosis not present

## 2020-01-18 DIAGNOSIS — F0390 Unspecified dementia without behavioral disturbance: Secondary | ICD-10-CM | POA: Diagnosis not present

## 2020-01-20 DIAGNOSIS — E1122 Type 2 diabetes mellitus with diabetic chronic kidney disease: Secondary | ICD-10-CM | POA: Diagnosis not present

## 2020-01-20 DIAGNOSIS — J449 Chronic obstructive pulmonary disease, unspecified: Secondary | ICD-10-CM | POA: Diagnosis not present

## 2020-01-20 DIAGNOSIS — I110 Hypertensive heart disease with heart failure: Secondary | ICD-10-CM | POA: Diagnosis not present

## 2020-01-20 DIAGNOSIS — D509 Iron deficiency anemia, unspecified: Secondary | ICD-10-CM | POA: Diagnosis not present

## 2020-01-20 DIAGNOSIS — I5033 Acute on chronic diastolic (congestive) heart failure: Secondary | ICD-10-CM | POA: Diagnosis not present

## 2020-01-20 DIAGNOSIS — I5022 Chronic systolic (congestive) heart failure: Secondary | ICD-10-CM | POA: Diagnosis not present

## 2020-01-20 DIAGNOSIS — F0390 Unspecified dementia without behavioral disturbance: Secondary | ICD-10-CM | POA: Diagnosis not present

## 2020-01-20 DIAGNOSIS — N183 Chronic kidney disease, stage 3 unspecified: Secondary | ICD-10-CM | POA: Diagnosis not present

## 2020-01-20 DIAGNOSIS — I2510 Atherosclerotic heart disease of native coronary artery without angina pectoris: Secondary | ICD-10-CM | POA: Diagnosis not present

## 2020-01-22 DIAGNOSIS — I5022 Chronic systolic (congestive) heart failure: Secondary | ICD-10-CM | POA: Diagnosis not present

## 2020-01-22 DIAGNOSIS — D509 Iron deficiency anemia, unspecified: Secondary | ICD-10-CM | POA: Diagnosis not present

## 2020-01-22 DIAGNOSIS — E1122 Type 2 diabetes mellitus with diabetic chronic kidney disease: Secondary | ICD-10-CM | POA: Diagnosis not present

## 2020-01-22 DIAGNOSIS — J449 Chronic obstructive pulmonary disease, unspecified: Secondary | ICD-10-CM | POA: Diagnosis not present

## 2020-01-22 DIAGNOSIS — F0390 Unspecified dementia without behavioral disturbance: Secondary | ICD-10-CM | POA: Diagnosis not present

## 2020-01-22 DIAGNOSIS — I110 Hypertensive heart disease with heart failure: Secondary | ICD-10-CM | POA: Diagnosis not present

## 2020-01-22 DIAGNOSIS — N183 Chronic kidney disease, stage 3 unspecified: Secondary | ICD-10-CM | POA: Diagnosis not present

## 2020-01-22 DIAGNOSIS — I2510 Atherosclerotic heart disease of native coronary artery without angina pectoris: Secondary | ICD-10-CM | POA: Diagnosis not present

## 2020-01-22 DIAGNOSIS — I5033 Acute on chronic diastolic (congestive) heart failure: Secondary | ICD-10-CM | POA: Diagnosis not present

## 2020-01-27 DIAGNOSIS — J449 Chronic obstructive pulmonary disease, unspecified: Secondary | ICD-10-CM | POA: Diagnosis not present

## 2020-01-27 DIAGNOSIS — I110 Hypertensive heart disease with heart failure: Secondary | ICD-10-CM | POA: Diagnosis not present

## 2020-01-27 DIAGNOSIS — I5022 Chronic systolic (congestive) heart failure: Secondary | ICD-10-CM | POA: Diagnosis not present

## 2020-01-27 DIAGNOSIS — E1122 Type 2 diabetes mellitus with diabetic chronic kidney disease: Secondary | ICD-10-CM | POA: Diagnosis not present

## 2020-01-27 DIAGNOSIS — I5033 Acute on chronic diastolic (congestive) heart failure: Secondary | ICD-10-CM | POA: Diagnosis not present

## 2020-01-27 DIAGNOSIS — I2510 Atherosclerotic heart disease of native coronary artery without angina pectoris: Secondary | ICD-10-CM | POA: Diagnosis not present

## 2020-01-27 DIAGNOSIS — F0390 Unspecified dementia without behavioral disturbance: Secondary | ICD-10-CM | POA: Diagnosis not present

## 2020-01-27 DIAGNOSIS — N183 Chronic kidney disease, stage 3 unspecified: Secondary | ICD-10-CM | POA: Diagnosis not present

## 2020-01-27 DIAGNOSIS — D509 Iron deficiency anemia, unspecified: Secondary | ICD-10-CM | POA: Diagnosis not present

## 2020-01-30 DIAGNOSIS — I2510 Atherosclerotic heart disease of native coronary artery without angina pectoris: Secondary | ICD-10-CM | POA: Diagnosis not present

## 2020-01-30 DIAGNOSIS — F0390 Unspecified dementia without behavioral disturbance: Secondary | ICD-10-CM | POA: Diagnosis not present

## 2020-01-30 DIAGNOSIS — I5042 Chronic combined systolic (congestive) and diastolic (congestive) heart failure: Secondary | ICD-10-CM | POA: Diagnosis not present

## 2020-01-30 DIAGNOSIS — J449 Chronic obstructive pulmonary disease, unspecified: Secondary | ICD-10-CM | POA: Diagnosis not present

## 2020-01-31 DIAGNOSIS — E1151 Type 2 diabetes mellitus with diabetic peripheral angiopathy without gangrene: Secondary | ICD-10-CM | POA: Diagnosis not present

## 2020-01-31 DIAGNOSIS — R531 Weakness: Secondary | ICD-10-CM | POA: Diagnosis not present

## 2020-01-31 DIAGNOSIS — I130 Hypertensive heart and chronic kidney disease with heart failure and stage 1 through stage 4 chronic kidney disease, or unspecified chronic kidney disease: Secondary | ICD-10-CM | POA: Diagnosis not present

## 2020-01-31 DIAGNOSIS — N1831 Chronic kidney disease, stage 3a: Secondary | ICD-10-CM | POA: Diagnosis not present

## 2020-01-31 DIAGNOSIS — G309 Alzheimer's disease, unspecified: Secondary | ICD-10-CM | POA: Diagnosis not present

## 2020-02-03 DIAGNOSIS — J449 Chronic obstructive pulmonary disease, unspecified: Secondary | ICD-10-CM | POA: Diagnosis not present

## 2020-02-03 DIAGNOSIS — D509 Iron deficiency anemia, unspecified: Secondary | ICD-10-CM | POA: Diagnosis not present

## 2020-02-03 DIAGNOSIS — I110 Hypertensive heart disease with heart failure: Secondary | ICD-10-CM | POA: Diagnosis not present

## 2020-02-03 DIAGNOSIS — F0390 Unspecified dementia without behavioral disturbance: Secondary | ICD-10-CM | POA: Diagnosis not present

## 2020-02-03 DIAGNOSIS — E782 Mixed hyperlipidemia: Secondary | ICD-10-CM | POA: Diagnosis not present

## 2020-02-03 DIAGNOSIS — E1122 Type 2 diabetes mellitus with diabetic chronic kidney disease: Secondary | ICD-10-CM | POA: Diagnosis not present

## 2020-02-03 DIAGNOSIS — N183 Chronic kidney disease, stage 3 unspecified: Secondary | ICD-10-CM | POA: Diagnosis not present

## 2020-02-03 DIAGNOSIS — I5022 Chronic systolic (congestive) heart failure: Secondary | ICD-10-CM | POA: Diagnosis not present

## 2020-02-03 DIAGNOSIS — I5033 Acute on chronic diastolic (congestive) heart failure: Secondary | ICD-10-CM | POA: Diagnosis not present

## 2020-02-03 DIAGNOSIS — I2510 Atherosclerotic heart disease of native coronary artery without angina pectoris: Secondary | ICD-10-CM | POA: Diagnosis not present

## 2020-02-10 DIAGNOSIS — I2510 Atherosclerotic heart disease of native coronary artery without angina pectoris: Secondary | ICD-10-CM | POA: Diagnosis not present

## 2020-02-10 DIAGNOSIS — I5033 Acute on chronic diastolic (congestive) heart failure: Secondary | ICD-10-CM | POA: Diagnosis not present

## 2020-02-10 DIAGNOSIS — F0390 Unspecified dementia without behavioral disturbance: Secondary | ICD-10-CM | POA: Diagnosis not present

## 2020-02-10 DIAGNOSIS — I5022 Chronic systolic (congestive) heart failure: Secondary | ICD-10-CM | POA: Diagnosis not present

## 2020-02-10 DIAGNOSIS — E1122 Type 2 diabetes mellitus with diabetic chronic kidney disease: Secondary | ICD-10-CM | POA: Diagnosis not present

## 2020-02-10 DIAGNOSIS — N183 Chronic kidney disease, stage 3 unspecified: Secondary | ICD-10-CM | POA: Diagnosis not present

## 2020-02-10 DIAGNOSIS — J449 Chronic obstructive pulmonary disease, unspecified: Secondary | ICD-10-CM | POA: Diagnosis not present

## 2020-02-10 DIAGNOSIS — I110 Hypertensive heart disease with heart failure: Secondary | ICD-10-CM | POA: Diagnosis not present

## 2020-02-10 DIAGNOSIS — D509 Iron deficiency anemia, unspecified: Secondary | ICD-10-CM | POA: Diagnosis not present

## 2020-02-12 DIAGNOSIS — I5033 Acute on chronic diastolic (congestive) heart failure: Secondary | ICD-10-CM | POA: Diagnosis not present

## 2020-02-12 DIAGNOSIS — F0390 Unspecified dementia without behavioral disturbance: Secondary | ICD-10-CM | POA: Diagnosis not present

## 2020-02-12 DIAGNOSIS — D509 Iron deficiency anemia, unspecified: Secondary | ICD-10-CM | POA: Diagnosis not present

## 2020-02-12 DIAGNOSIS — N183 Chronic kidney disease, stage 3 unspecified: Secondary | ICD-10-CM | POA: Diagnosis not present

## 2020-02-12 DIAGNOSIS — E1122 Type 2 diabetes mellitus with diabetic chronic kidney disease: Secondary | ICD-10-CM | POA: Diagnosis not present

## 2020-02-12 DIAGNOSIS — I110 Hypertensive heart disease with heart failure: Secondary | ICD-10-CM | POA: Diagnosis not present

## 2020-02-12 DIAGNOSIS — J449 Chronic obstructive pulmonary disease, unspecified: Secondary | ICD-10-CM | POA: Diagnosis not present

## 2020-02-12 DIAGNOSIS — I2510 Atherosclerotic heart disease of native coronary artery without angina pectoris: Secondary | ICD-10-CM | POA: Diagnosis not present

## 2020-02-12 DIAGNOSIS — I5022 Chronic systolic (congestive) heart failure: Secondary | ICD-10-CM | POA: Diagnosis not present

## 2020-02-17 DIAGNOSIS — I110 Hypertensive heart disease with heart failure: Secondary | ICD-10-CM | POA: Diagnosis not present

## 2020-02-17 DIAGNOSIS — J449 Chronic obstructive pulmonary disease, unspecified: Secondary | ICD-10-CM | POA: Diagnosis not present

## 2020-02-17 DIAGNOSIS — F0390 Unspecified dementia without behavioral disturbance: Secondary | ICD-10-CM | POA: Diagnosis not present

## 2020-02-17 DIAGNOSIS — I5022 Chronic systolic (congestive) heart failure: Secondary | ICD-10-CM | POA: Diagnosis not present

## 2020-02-17 DIAGNOSIS — I2510 Atherosclerotic heart disease of native coronary artery without angina pectoris: Secondary | ICD-10-CM | POA: Diagnosis not present

## 2020-02-17 DIAGNOSIS — E1122 Type 2 diabetes mellitus with diabetic chronic kidney disease: Secondary | ICD-10-CM | POA: Diagnosis not present

## 2020-02-17 DIAGNOSIS — I5033 Acute on chronic diastolic (congestive) heart failure: Secondary | ICD-10-CM | POA: Diagnosis not present

## 2020-02-17 DIAGNOSIS — D509 Iron deficiency anemia, unspecified: Secondary | ICD-10-CM | POA: Diagnosis not present

## 2020-02-17 DIAGNOSIS — N183 Chronic kidney disease, stage 3 unspecified: Secondary | ICD-10-CM | POA: Diagnosis not present

## 2020-02-24 DIAGNOSIS — D509 Iron deficiency anemia, unspecified: Secondary | ICD-10-CM | POA: Diagnosis not present

## 2020-02-24 DIAGNOSIS — I2510 Atherosclerotic heart disease of native coronary artery without angina pectoris: Secondary | ICD-10-CM | POA: Diagnosis not present

## 2020-02-24 DIAGNOSIS — J449 Chronic obstructive pulmonary disease, unspecified: Secondary | ICD-10-CM | POA: Diagnosis not present

## 2020-02-24 DIAGNOSIS — F0390 Unspecified dementia without behavioral disturbance: Secondary | ICD-10-CM | POA: Diagnosis not present

## 2020-02-24 DIAGNOSIS — I110 Hypertensive heart disease with heart failure: Secondary | ICD-10-CM | POA: Diagnosis not present

## 2020-02-24 DIAGNOSIS — I5033 Acute on chronic diastolic (congestive) heart failure: Secondary | ICD-10-CM | POA: Diagnosis not present

## 2020-02-24 DIAGNOSIS — E1122 Type 2 diabetes mellitus with diabetic chronic kidney disease: Secondary | ICD-10-CM | POA: Diagnosis not present

## 2020-02-24 DIAGNOSIS — I5022 Chronic systolic (congestive) heart failure: Secondary | ICD-10-CM | POA: Diagnosis not present

## 2020-02-24 DIAGNOSIS — N183 Chronic kidney disease, stage 3 unspecified: Secondary | ICD-10-CM | POA: Diagnosis not present

## 2020-02-29 DIAGNOSIS — J449 Chronic obstructive pulmonary disease, unspecified: Secondary | ICD-10-CM | POA: Diagnosis not present

## 2020-02-29 DIAGNOSIS — F0390 Unspecified dementia without behavioral disturbance: Secondary | ICD-10-CM | POA: Diagnosis not present

## 2020-02-29 DIAGNOSIS — I2510 Atherosclerotic heart disease of native coronary artery without angina pectoris: Secondary | ICD-10-CM | POA: Diagnosis not present

## 2020-02-29 DIAGNOSIS — I5042 Chronic combined systolic (congestive) and diastolic (congestive) heart failure: Secondary | ICD-10-CM | POA: Diagnosis not present

## 2020-03-02 DIAGNOSIS — D509 Iron deficiency anemia, unspecified: Secondary | ICD-10-CM | POA: Diagnosis not present

## 2020-03-02 DIAGNOSIS — I5022 Chronic systolic (congestive) heart failure: Secondary | ICD-10-CM | POA: Diagnosis not present

## 2020-03-02 DIAGNOSIS — J449 Chronic obstructive pulmonary disease, unspecified: Secondary | ICD-10-CM | POA: Diagnosis not present

## 2020-03-02 DIAGNOSIS — I110 Hypertensive heart disease with heart failure: Secondary | ICD-10-CM | POA: Diagnosis not present

## 2020-03-02 DIAGNOSIS — F0390 Unspecified dementia without behavioral disturbance: Secondary | ICD-10-CM | POA: Diagnosis not present

## 2020-03-02 DIAGNOSIS — N183 Chronic kidney disease, stage 3 unspecified: Secondary | ICD-10-CM | POA: Diagnosis not present

## 2020-03-02 DIAGNOSIS — I5033 Acute on chronic diastolic (congestive) heart failure: Secondary | ICD-10-CM | POA: Diagnosis not present

## 2020-03-02 DIAGNOSIS — I2510 Atherosclerotic heart disease of native coronary artery without angina pectoris: Secondary | ICD-10-CM | POA: Diagnosis not present

## 2020-03-02 DIAGNOSIS — E1122 Type 2 diabetes mellitus with diabetic chronic kidney disease: Secondary | ICD-10-CM | POA: Diagnosis not present

## 2020-03-09 DIAGNOSIS — J449 Chronic obstructive pulmonary disease, unspecified: Secondary | ICD-10-CM | POA: Diagnosis not present

## 2020-03-09 DIAGNOSIS — E1122 Type 2 diabetes mellitus with diabetic chronic kidney disease: Secondary | ICD-10-CM | POA: Diagnosis not present

## 2020-03-09 DIAGNOSIS — I5033 Acute on chronic diastolic (congestive) heart failure: Secondary | ICD-10-CM | POA: Diagnosis not present

## 2020-03-09 DIAGNOSIS — N183 Chronic kidney disease, stage 3 unspecified: Secondary | ICD-10-CM | POA: Diagnosis not present

## 2020-03-09 DIAGNOSIS — I5022 Chronic systolic (congestive) heart failure: Secondary | ICD-10-CM | POA: Diagnosis not present

## 2020-03-09 DIAGNOSIS — F0390 Unspecified dementia without behavioral disturbance: Secondary | ICD-10-CM | POA: Diagnosis not present

## 2020-03-09 DIAGNOSIS — D509 Iron deficiency anemia, unspecified: Secondary | ICD-10-CM | POA: Diagnosis not present

## 2020-03-09 DIAGNOSIS — I2510 Atherosclerotic heart disease of native coronary artery without angina pectoris: Secondary | ICD-10-CM | POA: Diagnosis not present

## 2020-03-09 DIAGNOSIS — I110 Hypertensive heart disease with heart failure: Secondary | ICD-10-CM | POA: Diagnosis not present

## 2020-03-31 DIAGNOSIS — J449 Chronic obstructive pulmonary disease, unspecified: Secondary | ICD-10-CM | POA: Diagnosis not present

## 2020-03-31 DIAGNOSIS — F0390 Unspecified dementia without behavioral disturbance: Secondary | ICD-10-CM | POA: Diagnosis not present

## 2020-03-31 DIAGNOSIS — I2510 Atherosclerotic heart disease of native coronary artery without angina pectoris: Secondary | ICD-10-CM | POA: Diagnosis not present

## 2020-03-31 DIAGNOSIS — I5042 Chronic combined systolic (congestive) and diastolic (congestive) heart failure: Secondary | ICD-10-CM | POA: Diagnosis not present

## 2020-04-30 DIAGNOSIS — I5042 Chronic combined systolic (congestive) and diastolic (congestive) heart failure: Secondary | ICD-10-CM | POA: Diagnosis not present

## 2020-04-30 DIAGNOSIS — F0390 Unspecified dementia without behavioral disturbance: Secondary | ICD-10-CM | POA: Diagnosis not present

## 2020-04-30 DIAGNOSIS — I2510 Atherosclerotic heart disease of native coronary artery without angina pectoris: Secondary | ICD-10-CM | POA: Diagnosis not present

## 2020-04-30 DIAGNOSIS — J449 Chronic obstructive pulmonary disease, unspecified: Secondary | ICD-10-CM | POA: Diagnosis not present

## 2020-05-14 ENCOUNTER — Telehealth: Payer: Self-pay | Admitting: Cardiology

## 2020-05-15 ENCOUNTER — Ambulatory Visit: Payer: Medicare PPO | Admitting: Cardiology

## 2020-05-24 NOTE — Telephone Encounter (Signed)
Future appointments cancelled per family request

## 2020-05-31 DIAGNOSIS — F0390 Unspecified dementia without behavioral disturbance: Secondary | ICD-10-CM | POA: Diagnosis not present

## 2020-05-31 DIAGNOSIS — J449 Chronic obstructive pulmonary disease, unspecified: Secondary | ICD-10-CM | POA: Diagnosis not present

## 2020-05-31 DIAGNOSIS — I2510 Atherosclerotic heart disease of native coronary artery without angina pectoris: Secondary | ICD-10-CM | POA: Diagnosis not present

## 2020-05-31 DIAGNOSIS — I5042 Chronic combined systolic (congestive) and diastolic (congestive) heart failure: Secondary | ICD-10-CM | POA: Diagnosis not present

## 2020-06-29 DIAGNOSIS — F0390 Unspecified dementia without behavioral disturbance: Secondary | ICD-10-CM | POA: Diagnosis not present

## 2020-06-29 DIAGNOSIS — J449 Chronic obstructive pulmonary disease, unspecified: Secondary | ICD-10-CM | POA: Diagnosis not present

## 2020-06-29 DIAGNOSIS — I5042 Chronic combined systolic (congestive) and diastolic (congestive) heart failure: Secondary | ICD-10-CM | POA: Diagnosis not present

## 2020-06-29 DIAGNOSIS — I2510 Atherosclerotic heart disease of native coronary artery without angina pectoris: Secondary | ICD-10-CM | POA: Diagnosis not present

## 2020-07-29 DIAGNOSIS — J449 Chronic obstructive pulmonary disease, unspecified: Secondary | ICD-10-CM | POA: Diagnosis not present

## 2020-07-29 DIAGNOSIS — I5042 Chronic combined systolic (congestive) and diastolic (congestive) heart failure: Secondary | ICD-10-CM | POA: Diagnosis not present

## 2020-07-29 DIAGNOSIS — F0390 Unspecified dementia without behavioral disturbance: Secondary | ICD-10-CM | POA: Diagnosis not present

## 2020-07-29 DIAGNOSIS — I2510 Atherosclerotic heart disease of native coronary artery without angina pectoris: Secondary | ICD-10-CM | POA: Diagnosis not present

## 2020-08-29 DIAGNOSIS — F0390 Unspecified dementia without behavioral disturbance: Secondary | ICD-10-CM | POA: Diagnosis not present

## 2020-08-29 DIAGNOSIS — I5042 Chronic combined systolic (congestive) and diastolic (congestive) heart failure: Secondary | ICD-10-CM | POA: Diagnosis not present

## 2020-08-29 DIAGNOSIS — J449 Chronic obstructive pulmonary disease, unspecified: Secondary | ICD-10-CM | POA: Diagnosis not present

## 2020-08-29 DIAGNOSIS — I2510 Atherosclerotic heart disease of native coronary artery without angina pectoris: Secondary | ICD-10-CM | POA: Diagnosis not present

## 2020-08-31 DIAGNOSIS — R2681 Unsteadiness on feet: Secondary | ICD-10-CM | POA: Diagnosis not present

## 2020-08-31 DIAGNOSIS — E1151 Type 2 diabetes mellitus with diabetic peripheral angiopathy without gangrene: Secondary | ICD-10-CM | POA: Diagnosis not present

## 2020-08-31 DIAGNOSIS — R7989 Other specified abnormal findings of blood chemistry: Secondary | ICD-10-CM | POA: Diagnosis not present

## 2020-08-31 DIAGNOSIS — I10 Essential (primary) hypertension: Secondary | ICD-10-CM | POA: Diagnosis not present

## 2020-08-31 DIAGNOSIS — R400 Somnolence: Secondary | ICD-10-CM | POA: Diagnosis not present

## 2020-08-31 DIAGNOSIS — J439 Emphysema, unspecified: Secondary | ICD-10-CM | POA: Diagnosis not present

## 2020-08-31 DIAGNOSIS — I739 Peripheral vascular disease, unspecified: Secondary | ICD-10-CM | POA: Diagnosis not present

## 2020-08-31 DIAGNOSIS — D508 Other iron deficiency anemias: Secondary | ICD-10-CM | POA: Diagnosis not present

## 2020-08-31 DIAGNOSIS — G309 Alzheimer's disease, unspecified: Secondary | ICD-10-CM | POA: Diagnosis not present

## 2020-09-16 DIAGNOSIS — H5213 Myopia, bilateral: Secondary | ICD-10-CM | POA: Diagnosis not present

## 2020-09-16 DIAGNOSIS — E119 Type 2 diabetes mellitus without complications: Secondary | ICD-10-CM | POA: Diagnosis not present

## 2020-09-16 DIAGNOSIS — H401111 Primary open-angle glaucoma, right eye, mild stage: Secondary | ICD-10-CM | POA: Diagnosis not present

## 2020-09-16 DIAGNOSIS — H401123 Primary open-angle glaucoma, left eye, severe stage: Secondary | ICD-10-CM | POA: Diagnosis not present

## 2020-09-28 DIAGNOSIS — F0390 Unspecified dementia without behavioral disturbance: Secondary | ICD-10-CM | POA: Diagnosis not present

## 2020-09-28 DIAGNOSIS — I5042 Chronic combined systolic (congestive) and diastolic (congestive) heart failure: Secondary | ICD-10-CM | POA: Diagnosis not present

## 2020-09-28 DIAGNOSIS — J449 Chronic obstructive pulmonary disease, unspecified: Secondary | ICD-10-CM | POA: Diagnosis not present

## 2020-09-28 DIAGNOSIS — I2510 Atherosclerotic heart disease of native coronary artery without angina pectoris: Secondary | ICD-10-CM | POA: Diagnosis not present

## 2020-10-29 DIAGNOSIS — F0390 Unspecified dementia without behavioral disturbance: Secondary | ICD-10-CM | POA: Diagnosis not present

## 2020-10-29 DIAGNOSIS — I5042 Chronic combined systolic (congestive) and diastolic (congestive) heart failure: Secondary | ICD-10-CM | POA: Diagnosis not present

## 2020-10-29 DIAGNOSIS — I2510 Atherosclerotic heart disease of native coronary artery without angina pectoris: Secondary | ICD-10-CM | POA: Diagnosis not present

## 2020-10-29 DIAGNOSIS — J449 Chronic obstructive pulmonary disease, unspecified: Secondary | ICD-10-CM | POA: Diagnosis not present

## 2020-11-13 ENCOUNTER — Emergency Department (HOSPITAL_COMMUNITY): Payer: Medicare PPO

## 2020-11-13 ENCOUNTER — Emergency Department (HOSPITAL_COMMUNITY)
Admission: EM | Admit: 2020-11-13 | Discharge: 2020-11-13 | Disposition: A | Discharge status: Home / Self Care | Payer: Medicare PPO | Attending: Emergency Medicine | Admitting: Emergency Medicine

## 2020-11-13 DIAGNOSIS — R42 Dizziness and giddiness: Secondary | ICD-10-CM

## 2020-11-13 DIAGNOSIS — M2578 Osteophyte, vertebrae: Secondary | ICD-10-CM | POA: Diagnosis not present

## 2020-11-13 DIAGNOSIS — I491 Atrial premature depolarization: Secondary | ICD-10-CM | POA: Diagnosis not present

## 2020-11-13 DIAGNOSIS — W19XXXA Unspecified fall, initial encounter: Secondary | ICD-10-CM | POA: Insufficient documentation

## 2020-11-13 DIAGNOSIS — E860 Dehydration: Secondary | ICD-10-CM | POA: Diagnosis not present

## 2020-11-13 DIAGNOSIS — M160 Bilateral primary osteoarthritis of hip: Secondary | ICD-10-CM | POA: Diagnosis not present

## 2020-11-13 DIAGNOSIS — Z043 Encounter for examination and observation following other accident: Secondary | ICD-10-CM | POA: Diagnosis not present

## 2020-11-13 DIAGNOSIS — R739 Hyperglycemia, unspecified: Secondary | ICD-10-CM | POA: Diagnosis not present

## 2020-11-13 DIAGNOSIS — I959 Hypotension, unspecified: Secondary | ICD-10-CM | POA: Diagnosis not present

## 2020-11-13 DIAGNOSIS — F0390 Unspecified dementia without behavioral disturbance: Secondary | ICD-10-CM | POA: Insufficient documentation

## 2020-11-13 DIAGNOSIS — T1490XA Injury, unspecified, initial encounter: Secondary | ICD-10-CM | POA: Diagnosis not present

## 2020-11-13 DIAGNOSIS — R296 Repeated falls: Secondary | ICD-10-CM | POA: Diagnosis not present

## 2020-11-13 DIAGNOSIS — R9082 White matter disease, unspecified: Secondary | ICD-10-CM | POA: Diagnosis not present

## 2020-11-13 LAB — I-STAT CHEM 8, ED
BUN: 39 mg/dL — ABNORMAL HIGH (ref 8–23)
Calcium, Ion: 1.12 mmol/L — ABNORMAL LOW (ref 1.15–1.40)
Chloride: 102 mmol/L (ref 98–111)
Creatinine, Ser: 1.9 mg/dL — ABNORMAL HIGH (ref 0.61–1.24)
Glucose, Bld: 162 mg/dL — ABNORMAL HIGH (ref 70–99)
HCT: 27 % — ABNORMAL LOW (ref 39.0–52.0)
Hemoglobin: 9.2 g/dL — ABNORMAL LOW (ref 13.0–17.0)
Potassium: 3.7 mmol/L (ref 3.5–5.1)
Sodium: 141 mmol/L (ref 135–145)
TCO2: 27 mmol/L (ref 22–32)

## 2020-11-13 LAB — AMMONIA: Ammonia: 16 umol/L (ref 9–35)

## 2020-11-13 LAB — CBG MONITORING, ED: Glucose-Capillary: 175 mg/dL — ABNORMAL HIGH (ref 70–99)

## 2020-11-13 LAB — COMPREHENSIVE METABOLIC PANEL
ALT: 16 U/L (ref 0–44)
AST: 13 U/L — ABNORMAL LOW (ref 15–41)
Albumin: 2.5 g/dL — ABNORMAL LOW (ref 3.5–5.0)
Alkaline Phosphatase: 71 U/L (ref 38–126)
Anion gap: 8 (ref 5–15)
BUN: 42 mg/dL — ABNORMAL HIGH (ref 8–23)
CO2: 28 mmol/L (ref 22–32)
Calcium: 8.2 mg/dL — ABNORMAL LOW (ref 8.9–10.3)
Chloride: 101 mmol/L (ref 98–111)
Creatinine, Ser: 1.9 mg/dL — ABNORMAL HIGH (ref 0.61–1.24)
GFR, Estimated: 33 mL/min — ABNORMAL LOW (ref >60)
Glucose, Bld: 164 mg/dL — ABNORMAL HIGH (ref 70–99)
Potassium: 3.7 mmol/L (ref 3.5–5.1)
Sodium: 137 mmol/L (ref 135–145)
Total Bilirubin: 0.5 mg/dL (ref 0.3–1.2)
Total Protein: 4.9 g/dL — ABNORMAL LOW (ref 6.5–8.1)

## 2020-11-13 LAB — TSH: TSH: 5.912 u[IU]/mL — ABNORMAL HIGH (ref 0.350–4.500)

## 2020-11-13 LAB — SAMPLE TO BLOOD BANK

## 2020-11-13 LAB — CBC
HCT: 27.4 % — ABNORMAL LOW (ref 39.0–52.0)
Hemoglobin: 8.8 g/dL — ABNORMAL LOW (ref 13.0–17.0)
MCH: 26.1 pg (ref 26.0–34.0)
MCHC: 32.1 g/dL (ref 30.0–36.0)
MCV: 81.3 fL (ref 80.0–100.0)
Platelets: 195 10*3/uL (ref 150–400)
RBC: 3.37 MIL/uL — ABNORMAL LOW (ref 4.22–5.81)
RDW: 15.1 % (ref 11.5–15.5)
WBC: 11.3 10*3/uL — ABNORMAL HIGH (ref 4.0–10.5)
nRBC: 0 % (ref 0.0–0.2)

## 2020-11-13 LAB — PROTIME-INR
INR: 1.1 (ref 0.8–1.2)
Prothrombin Time: 14.3 seconds (ref 11.4–15.2)

## 2020-11-13 LAB — ETHANOL: Alcohol, Ethyl (B): <10 mg/dL (ref <10)

## 2020-11-13 LAB — LACTIC ACID, PLASMA: Lactic Acid, Venous: 2.1 mmol/L (ref 0.5–1.9)

## 2020-11-13 MED ORDER — SODIUM CHLORIDE 0.9 % IV BOLUS
1000.0000 mL | Freq: Once | INTRAVENOUS | Status: AC
  Administered 2020-11-13: 1000 mL via INTRAVENOUS

## 2020-11-13 MED ORDER — SODIUM CHLORIDE 0.9 % IV BOLUS
500.0000 mL | Freq: Once | INTRAVENOUS | Status: AC
  Administered 2020-11-13: 500 mL via INTRAVENOUS

## 2020-11-13 NOTE — Progress Notes (Signed)
Orthopedic Tech Progress Note Patient Details:  [name] Apr 11, 1929 [id number] Level 1 Trauma  Patient ID: [name], male   DOB: Jan 25, 1929, 87 y.o.   MRN: [id number]  [name] 11/13/2020, 11:48 AM

## 2020-11-13 NOTE — Discharge Instructions (Addendum)
Your work-up today was consistent with some dehydration leading to your soft blood pressures and likely contributing to your falls.  The imaging today did not show acute traumatic injuries and after discussion with both yourself and your family, we feel you are safe for discharge after rehydration.  Your vital signs remained reassuring on reassessment and you told me you are feeling better.  Please increase your hydration and follow-up with your primary doctor.  If any symptoms change or worsen, please return to the nearest emergency department.  It was a pleasure taking care of you today.

## 2020-11-13 NOTE — ED Provider Notes (Signed)
[facility] Provider Note   CSN: [id number] Arrival date & time: 11/13/20  1136     History No chief complaint on file.   Jerrod Moes is a 87 y.o. male.  The history is provided by the patient and medical records. No language interpreter was used.  Fall This is a recurrent problem. The current episode started less than 1 hour ago. The problem occurs constantly. The problem has not changed since onset.Pertinent negatives include no chest pain, no abdominal pain, no headaches and no shortness of breath. Nothing aggravates the symptoms. Nothing relieves the symptoms. He has tried nothing for the symptoms. The treatment provided no relief.      No past medical history on file.  There are no problems to display for this patient.     No family history on file.     Home Medications Prior to Admission medications   Not on File    Allergies    Patient has no allergy information on record.  Review of Systems   Review of Systems  Unable to perform ROS: Dementia  Constitutional:  Negative for chills, diaphoresis, fatigue and fever.  HENT:  Negative for congestion.   Eyes:  Negative for visual disturbance.  Respiratory:  Negative for cough, chest tightness and shortness of breath.   Cardiovascular:  Negative for chest pain and palpitations.  Gastrointestinal:  Negative for abdominal pain, constipation, diarrhea, nausea and vomiting.  Genitourinary:  Negative for flank pain.  Musculoskeletal:  Negative for back pain, neck pain and neck stiffness.  Skin:  Negative for rash and wound.  Neurological:  Positive for speech difficulty. Negative for dizziness, weakness, light-headedness, numbness and headaches.  Psychiatric/Behavioral:  Positive for confusion. Negative for agitation.   All other systems reviewed and are negative.  Physical Exam Updated Vital Signs BP (!) 101/55   Pulse 63   Temp (!) 96.5 F (35.8 C) (Temporal)   Resp 16    Ht 5\' 10"  (1.778 m)   Wt 81.6 kg   SpO2 98%   BMI 25.83 kg/m   Physical Exam Vitals and nursing note reviewed.  Constitutional:      General: He is not in acute distress.    Appearance: He is well-developed. He is not ill-appearing, toxic-appearing or diaphoretic.  HENT:     Head: Normocephalic and atraumatic.     Nose: No congestion.     Mouth/Throat:     Mouth: Mucous membranes are moist.     Pharynx: No oropharyngeal exudate or posterior oropharyngeal erythema.  Eyes:     Extraocular Movements: Extraocular movements intact.     Conjunctiva/sclera: Conjunctivae normal.     Pupils: Pupils are equal, round, and reactive to light.  Neck:     Comments: In c collar Cardiovascular:     Rate and Rhythm: Normal rate and regular rhythm.     Heart sounds: No murmur heard. Pulmonary:     Effort: Pulmonary effort is normal. No respiratory distress.     Breath sounds: Normal breath sounds. No wheezing, rhonchi or rales.  Chest:     Chest wall: No tenderness.  Abdominal:     General: Abdomen is flat.     Palpations: Abdomen is soft.     Tenderness: There is no abdominal tenderness. There is no guarding or rebound.  Musculoskeletal:        General: No tenderness or deformity.     Cervical back: Neck supple.  Skin:    General: Skin is  warm and dry.     Capillary Refill: Capillary refill takes less than 2 seconds.     Findings: No erythema or rash.  Neurological:     Mental Status: He is alert. He is disoriented.     Sensory: No sensory deficit.     Motor: No weakness.    ED Results / Procedures / Treatments   Labs (all labs ordered are listed, but only abnormal results are displayed) Labs Reviewed  COMPREHENSIVE METABOLIC PANEL - Abnormal; Notable for the following components:      Result Value   Glucose, Bld 164 (*)    BUN 42 (*)    Creatinine, Ser 1.90 (*)    Calcium 8.2 (*)    Total Protein 4.9 (*)    Albumin 2.5 (*)    AST 13 (*)    GFR, Estimated 33 (*)    All  other components within normal limits  CBC - Abnormal; Notable for the following components:   WBC 11.3 (*)    RBC 3.37 (*)    Hemoglobin 8.8 (*)    HCT 27.4 (*)    All other components within normal limits  LACTIC ACID, PLASMA - Abnormal; Notable for the following components:   Lactic Acid, Venous 2.1 (*)    All other components within normal limits  TSH - Abnormal; Notable for the following components:   TSH 5.912 (*)    All other components within normal limits  CBG MONITORING, ED - Abnormal; Notable for the following components:   Glucose-Capillary 175 (*)    All other components within normal limits  I-STAT CHEM 8, ED - Abnormal; Notable for the following components:   BUN 39 (*)    Creatinine, Ser 1.90 (*)    Glucose, Bld 162 (*)    Calcium, Ion 1.12 (*)    Hemoglobin 9.2 (*)    HCT 27.0 (*)    All other components within normal limits  RESP PANEL BY RT-PCR (FLU A&B, COVID) ARPGX2  URINE CULTURE  ETHANOL  PROTIME-INR  AMMONIA  URINALYSIS, ROUTINE W REFLEX MICROSCOPIC  SAMPLE TO BLOOD BANK    EKG None  Radiology CT HEAD WO CONTRAST  Result Date: 11/13/2020 CLINICAL DATA:  Multiple falls, blood thinners EXAM: CT HEAD WITHOUT CONTRAST CT CERVICAL SPINE WITHOUT CONTRAST TECHNIQUE: Multidetector CT imaging of the head and cervical spine was performed following the standard protocol without intravenous contrast. Multiplanar CT image reconstructions of the cervical spine were also generated. COMPARISON:  05/07/2017 FINDINGS: CT HEAD FINDINGS Brain: No evidence of acute infarction, hemorrhage, hydrocephalus, extra-axial collection or mass lesion/mass effect. Extent periventricular and deep white matter hypodensity. Vascular: No hyperdense vessel or unexpected calcification. Skull: Normal. Negative for fracture or focal lesion. Sinuses/Orbits: No acute finding. Other: None. CT CERVICAL SPINE FINDINGS Alignment: Normal. Skull base and vertebrae: No acute fracture. No primary bone  lesion or focal pathologic process. Soft tissues and spinal canal: No prevertebral fluid or swelling. No visible canal hematoma. Disc levels: Moderate to severe multilevel disc space height loss and osteophytosis, worst at C5 through C7. Upper chest: Negative. Other: None. IMPRESSION: 1. No acute intracranial pathology. Advanced small-vessel white matter disease. 2. No fracture or static subluxation of the cervical spine. 3. Moderate to severe multilevel disc space height loss and osteophytosis, worst at C5 through C7. Electronically Signed   By: Eddie Candle M.D.   On: 11/13/2020 12:30   CT CERVICAL SPINE WO CONTRAST  Result Date: 11/13/2020 CLINICAL DATA:  Multiple falls, blood thinners  EXAM: CT HEAD WITHOUT CONTRAST CT CERVICAL SPINE WITHOUT CONTRAST TECHNIQUE: Multidetector CT imaging of the head and cervical spine was performed following the standard protocol without intravenous contrast. Multiplanar CT image reconstructions of the cervical spine were also generated. COMPARISON:  05/07/2017 FINDINGS: CT HEAD FINDINGS Brain: No evidence of acute infarction, hemorrhage, hydrocephalus, extra-axial collection or mass lesion/mass effect. Extent periventricular and deep white matter hypodensity. Vascular: No hyperdense vessel or unexpected calcification. Skull: Normal. Negative for fracture or focal lesion. Sinuses/Orbits: No acute finding. Other: None. CT CERVICAL SPINE FINDINGS Alignment: Normal. Skull base and vertebrae: No acute fracture. No primary bone lesion or focal pathologic process. Soft tissues and spinal canal: No prevertebral fluid or swelling. No visible canal hematoma. Disc levels: Moderate to severe multilevel disc space height loss and osteophytosis, worst at C5 through C7. Upper chest: Negative. Other: None. IMPRESSION: 1. No acute intracranial pathology. Advanced small-vessel white matter disease. 2. No fracture or static subluxation of the cervical spine. 3. Moderate to severe multilevel disc  space height loss and osteophytosis, worst at C5 through C7. Electronically Signed   By: Eddie Candle M.D.   On: 11/13/2020 12:30   DG Pelvis Portable  Result Date: 11/13/2020 CLINICAL DATA:  Fall. EXAM: PORTABLE PELVIS 1-2 VIEWS COMPARISON:  CT AP 11/06/2019 FINDINGS: There is no evidence of pelvic fracture or diastasis. Mild bilateral hip osteoarthritis. Atherosclerotic calcifications are noted involving the femoral arteries. Seed implants noted within the prostate gland. No pelvic bone lesions are seen. IMPRESSION: Negative. Electronically Signed   By: Kerby Moors M.D.   On: 11/13/2020 12:21   DG Chest Port 1 View  Result Date: 11/13/2020 CLINICAL DATA:  Fall, patient on blood thinners. EXAM: PORTABLE CHEST 1 VIEW COMPARISON:  November 06, 2019. FINDINGS: EKG leads project over the chest. Cardiomediastinal contours and hilar structures are stable accounting for slight rotation on the current assessment. Lungs are clear. No visible pneumothorax or pleural effusion on portable AP projection. On limited assessment no acute skeletal process. IMPRESSION: No acute cardiopulmonary disease. Electronically Signed   By: Zetta Bills M.D.   On: 11/13/2020 12:22    Procedures Procedures   Medications Ordered in ED Medications  sodium chloride 0.9 % bolus 500 mL (has no administration in time range)    ED Course  I have reviewed the triage vital signs and the nursing notes.  Pertinent labs & imaging results that were available during my care of the patient were reviewed by me and considered in my medical decision making (see chart for details).    MDM Rules/Calculators/A&P                          Heyden Smay is a 87 y.o. male with a past medical history significant for Alzheimer's dementia, CAD, CHF, OSA, COPD, diabetes, and prior stroke on aspirin and Plavix who presents as a level 1 trauma for fall with hypotension.  According to EMS, patient has had several falls in the last few days and today  had a fall and was found to be hypotensive on scene.  Blood pressure was in the 80s.  Patient was given 500 cc normal saline and blood pressure started to improve.  On arrival, it is in the 90s.  Patient was having some waxing waning difficulty speaking toward EMS but they also said that he was at his mental status baseline.  Patient was placed in a c-collar and brought in for the fall with concern for  head injury and soft pressures.  On arrival, airway is intact and breath sounds are equal bilaterally.  Chest and abdomen are nontender.  Moving all extremities.  Patient is disoriented and said " [place]" as the current year.  EMS was concerned about small bump on his head but otherwise there is no large laceration seen.  Abdomen and pelvis were nontender.  Due to his difficulty with medication, trauma agreed to get CT head, neck, and portable chest and portable pelvis imaging.  We will do a medical work-up to look for sources of confusion or altered mental status as well as difficulty with speech.  Son is a physician at this facility, will try to engage with family to determine what his baseline is.  Anticipate reassessment after work-up to determine disposition.  1:39 PM Just went over work-up results with patient and patient's son who is an ICU physician at this facility.  Son reports that patient has been spending time outside and may be dehydrated.  Patient does have a creatinine of 1.9 however it was 2.1 a year ago.  Son suspects that patient is dehydrated and could probably use more fluids.  Other work-up was overall reassuring as we discussed.  CT imaging of the head and neck and imaging of the chest and pelvis were reassuring.  Decision made to give the patient 1 more liter of fluids and check orthostatics.  If he is feeling better and appears well, patient will be discharged home with likely dehydration leading to fall.  If any work-up is more concerning, anticipate admission for further  rehydration.  Family agrees.  Nursing reports that orthostatics are reassuring and patient is doing much better.  I assessed him and he is resting comfortably with a blood pressure of 130.  He would like to go home.  Patient will be discharged to maintain hydration at home and follow-up with PCP.  Patient discharged in good condition.  Final Clinical Impression(s) / ED Diagnoses Final diagnoses:  Trauma  Dehydration  Lightheaded    Rx / DC Orders ED Discharge Orders     None      Clinical Impression: 1. Dehydration   2. Trauma   3. Lightheaded     Disposition: Discharge  Condition: Good  I have discussed the results, Dx and Tx plan with the pt(& family if present). He/she/they expressed understanding and agree(s) with the plan. Discharge instructions discussed at great length. Strict return precautions discussed and pt &/or family have verbalized understanding of the instructions. No further questions at time of discharge.    New Prescriptions   No medications on file    Follow Up: your PCP        Gresham Caetano, Gwenyth Allegra, MD 11/13/20 [phone]

## 2020-11-22 ENCOUNTER — Emergency Department (HOSPITAL_COMMUNITY): Payer: Medicare PPO

## 2020-11-22 ENCOUNTER — Observation Stay (HOSPITAL_COMMUNITY)
Admission: EM | Admit: 2020-11-22 | Discharge: 2020-11-24 | Disposition: A | Discharge status: Home / Self Care | Payer: Medicare PPO | Attending: Internal Medicine | Admitting: Internal Medicine

## 2020-11-22 ENCOUNTER — Encounter (HOSPITAL_COMMUNITY): Payer: Self-pay

## 2020-11-22 ENCOUNTER — Other Ambulatory Visit: Payer: Self-pay

## 2020-11-22 DIAGNOSIS — Z7982 Long term (current) use of aspirin: Secondary | ICD-10-CM | POA: Insufficient documentation

## 2020-11-22 DIAGNOSIS — I5033 Acute on chronic diastolic (congestive) heart failure: Secondary | ICD-10-CM | POA: Diagnosis not present

## 2020-11-22 DIAGNOSIS — G4733 Obstructive sleep apnea (adult) (pediatric): Secondary | ICD-10-CM | POA: Diagnosis present

## 2020-11-22 DIAGNOSIS — R55 Syncope and collapse: Secondary | ICD-10-CM | POA: Diagnosis not present

## 2020-11-22 DIAGNOSIS — I130 Hypertensive heart and chronic kidney disease with heart failure and stage 1 through stage 4 chronic kidney disease, or unspecified chronic kidney disease: Secondary | ICD-10-CM | POA: Insufficient documentation

## 2020-11-22 DIAGNOSIS — N1831 Chronic kidney disease, stage 3a: Secondary | ICD-10-CM | POA: Diagnosis present

## 2020-11-22 DIAGNOSIS — F0390 Unspecified dementia without behavioral disturbance: Secondary | ICD-10-CM | POA: Diagnosis present

## 2020-11-22 DIAGNOSIS — E1122 Type 2 diabetes mellitus with diabetic chronic kidney disease: Secondary | ICD-10-CM | POA: Insufficient documentation

## 2020-11-22 DIAGNOSIS — Z20822 Contact with and (suspected) exposure to covid-19: Secondary | ICD-10-CM | POA: Insufficient documentation

## 2020-11-22 DIAGNOSIS — R531 Weakness: Secondary | ICD-10-CM

## 2020-11-22 DIAGNOSIS — E1151 Type 2 diabetes mellitus with diabetic peripheral angiopathy without gangrene: Secondary | ICD-10-CM | POA: Diagnosis present

## 2020-11-22 DIAGNOSIS — J449 Chronic obstructive pulmonary disease, unspecified: Secondary | ICD-10-CM | POA: Diagnosis not present

## 2020-11-22 DIAGNOSIS — I959 Hypotension, unspecified: Secondary | ICD-10-CM | POA: Diagnosis not present

## 2020-11-22 DIAGNOSIS — I2510 Atherosclerotic heart disease of native coronary artery without angina pectoris: Secondary | ICD-10-CM | POA: Diagnosis present

## 2020-11-22 DIAGNOSIS — Z79899 Other long term (current) drug therapy: Secondary | ICD-10-CM | POA: Insufficient documentation

## 2020-11-22 DIAGNOSIS — I4892 Unspecified atrial flutter: Secondary | ICD-10-CM | POA: Diagnosis not present

## 2020-11-22 DIAGNOSIS — I484 Atypical atrial flutter: Secondary | ICD-10-CM | POA: Diagnosis not present

## 2020-11-22 DIAGNOSIS — Z87891 Personal history of nicotine dependence: Secondary | ICD-10-CM | POA: Diagnosis not present

## 2020-11-22 DIAGNOSIS — J45909 Unspecified asthma, uncomplicated: Secondary | ICD-10-CM | POA: Insufficient documentation

## 2020-11-22 DIAGNOSIS — E782 Mixed hyperlipidemia: Secondary | ICD-10-CM | POA: Diagnosis present

## 2020-11-22 DIAGNOSIS — Z96651 Presence of right artificial knee joint: Secondary | ICD-10-CM | POA: Insufficient documentation

## 2020-11-22 DIAGNOSIS — Z7984 Long term (current) use of oral hypoglycemic drugs: Secondary | ICD-10-CM | POA: Diagnosis not present

## 2020-11-22 DIAGNOSIS — I509 Heart failure, unspecified: Secondary | ICD-10-CM

## 2020-11-22 DIAGNOSIS — D509 Iron deficiency anemia, unspecified: Secondary | ICD-10-CM | POA: Diagnosis present

## 2020-11-22 DIAGNOSIS — R739 Hyperglycemia, unspecified: Secondary | ICD-10-CM | POA: Diagnosis not present

## 2020-11-22 DIAGNOSIS — G309 Alzheimer's disease, unspecified: Secondary | ICD-10-CM | POA: Diagnosis present

## 2020-11-22 DIAGNOSIS — F0280 Dementia in other diseases classified elsewhere without behavioral disturbance: Secondary | ICD-10-CM | POA: Diagnosis present

## 2020-11-22 LAB — CBC WITH DIFFERENTIAL/PLATELET
Abs Immature Granulocytes: 0.2 10*3/uL — ABNORMAL HIGH (ref 0.00–0.07)
Basophils Absolute: 0.1 10*3/uL (ref 0.0–0.1)
Basophils Relative: 0 %
Eosinophils Absolute: 0.7 10*3/uL — ABNORMAL HIGH (ref 0.0–0.5)
Eosinophils Relative: 5 %
HCT: 26 % — ABNORMAL LOW (ref 39.0–52.0)
Hemoglobin: 8.4 g/dL — ABNORMAL LOW (ref 13.0–17.0)
Immature Granulocytes: 1 %
Lymphocytes Relative: 10 %
Lymphs Abs: 1.3 10*3/uL (ref 0.7–4.0)
MCH: 26.3 pg (ref 26.0–34.0)
MCHC: 32.3 g/dL (ref 30.0–36.0)
MCV: 81.3 fL (ref 80.0–100.0)
Monocytes Absolute: 1.1 10*3/uL — ABNORMAL HIGH (ref 0.1–1.0)
Monocytes Relative: 8 %
Neutro Abs: 10.5 10*3/uL — ABNORMAL HIGH (ref 1.7–7.7)
Neutrophils Relative %: 76 %
Platelets: 160 10*3/uL (ref 150–400)
RBC: 3.2 MIL/uL — ABNORMAL LOW (ref 4.22–5.81)
RDW: 15.3 % (ref 11.5–15.5)
WBC: 13.9 10*3/uL — ABNORMAL HIGH (ref 4.0–10.5)
nRBC: 0 % (ref 0.0–0.2)

## 2020-11-22 LAB — BASIC METABOLIC PANEL
Anion gap: 7 (ref 5–15)
BUN: 40 mg/dL — ABNORMAL HIGH (ref 8–23)
CO2: 24 mmol/L (ref 22–32)
Calcium: 8 mg/dL — ABNORMAL LOW (ref 8.9–10.3)
Chloride: 105 mmol/L (ref 98–111)
Creatinine, Ser: 1.99 mg/dL — ABNORMAL HIGH (ref 0.61–1.24)
GFR, Estimated: 32 mL/min — ABNORMAL LOW (ref >60)
Glucose, Bld: 316 mg/dL — ABNORMAL HIGH (ref 70–99)
Potassium: 4.1 mmol/L (ref 3.5–5.1)
Sodium: 136 mmol/L (ref 135–145)

## 2020-11-22 LAB — URINALYSIS, ROUTINE W REFLEX MICROSCOPIC
Bacteria, UA: NONE SEEN
Bilirubin Urine: NEGATIVE
Glucose, UA: >=500 mg/dL — AB
Hgb urine dipstick: NEGATIVE
Ketones, ur: NEGATIVE mg/dL
Leukocytes,Ua: NEGATIVE
Nitrite: NEGATIVE
Protein, ur: NEGATIVE mg/dL
Specific Gravity, Urine: 1.009 (ref 1.005–1.030)
pH: 5 (ref 5.0–8.0)

## 2020-11-22 LAB — GLUCOSE, CAPILLARY
Glucose-Capillary: 482 mg/dL — ABNORMAL HIGH (ref 70–99)
Glucose-Capillary: 343 mg/dL — ABNORMAL HIGH (ref 70–99)

## 2020-11-22 LAB — T4, FREE: Free T4: 0.92 ng/dL (ref 0.61–1.12)

## 2020-11-22 LAB — VALPROIC ACID LEVEL: Valproic Acid Lvl: <10 ug/mL — ABNORMAL LOW (ref 50.0–100.0)

## 2020-11-22 LAB — HEPATIC FUNCTION PANEL
ALT: 15 U/L (ref 0–44)
AST: 13 U/L — ABNORMAL LOW (ref 15–41)
Albumin: 2.3 g/dL — ABNORMAL LOW (ref 3.5–5.0)
Alkaline Phosphatase: 69 U/L (ref 38–126)
Bilirubin, Direct: <0.1 mg/dL (ref 0.0–0.2)
Total Bilirubin: 0.4 mg/dL (ref 0.3–1.2)
Total Protein: 4.8 g/dL — ABNORMAL LOW (ref 6.5–8.1)

## 2020-11-22 LAB — TROPONIN I (HIGH SENSITIVITY)
Troponin I (High Sensitivity): 20 ng/L — ABNORMAL HIGH (ref <18)
Troponin I (High Sensitivity): 18 ng/L — ABNORMAL HIGH (ref <18)

## 2020-11-22 LAB — CBG MONITORING, ED: Glucose-Capillary: 399 mg/dL — ABNORMAL HIGH (ref 70–99)

## 2020-11-22 LAB — TSH: TSH: 2.839 u[IU]/mL (ref 0.350–4.500)

## 2020-11-22 LAB — RESP PANEL BY RT-PCR (FLU A&B, COVID) ARPGX2
Influenza A by PCR: NEGATIVE
Influenza B by PCR: NEGATIVE
SARS Coronavirus 2 by RT PCR: NEGATIVE

## 2020-11-22 LAB — LACTIC ACID, PLASMA
Lactic Acid, Venous: 1.9 mmol/L (ref 0.5–1.9)
Lactic Acid, Venous: 3.8 mmol/L (ref 0.5–1.9)
Lactic Acid, Venous: 3.1 mmol/L (ref 0.5–1.9)
Lactic Acid, Venous: 2.9 mmol/L (ref 0.5–1.9)

## 2020-11-22 LAB — BRAIN NATRIURETIC PEPTIDE: B Natriuretic Peptide: 151.3 pg/mL — ABNORMAL HIGH (ref 0.0–100.0)

## 2020-11-22 MED ORDER — ASPIRIN EC 81 MG PO TBEC: 81.0000 mg | DELAYED_RELEASE_TABLET | Freq: Every day | ORAL | Status: DC

## 2020-11-22 MED ORDER — ENOXAPARIN SODIUM 30 MG/0.3ML IJ SOSY
30.0000 mg | PREFILLED_SYRINGE | INTRAMUSCULAR | Status: DC
  Administered 2020-11-22: 30 mg via SUBCUTANEOUS
  Filled 2020-11-22: qty 0.3

## 2020-11-22 MED ORDER — ACETAMINOPHEN 325 MG PO TABS: 650.0000 mg | ORAL_TABLET | Freq: Four times a day (QID) | ORAL | Status: DC | PRN

## 2020-11-22 MED ORDER — LACTATED RINGERS IV SOLN: INTRAVENOUS | Status: AC

## 2020-11-22 MED ORDER — INSULIN GLARGINE 100 UNIT/ML ~~LOC~~ SOLN
10.0000 [IU] | Freq: Every day | SUBCUTANEOUS | Status: DC
  Administered 2020-11-22 – 2020-11-23 (×2): 10 [IU] via SUBCUTANEOUS
  Filled 2020-11-22 (×3): qty 0.1

## 2020-11-22 MED ORDER — ATORVASTATIN CALCIUM 10 MG PO TABS
20.0000 mg | ORAL_TABLET | Freq: Every day | ORAL | Status: DC
  Administered 2020-11-23 – 2020-11-24 (×2): 20 mg via ORAL
  Filled 2020-11-22 (×2): qty 2

## 2020-11-22 MED ORDER — NITROGLYCERIN 0.4 MG SL SUBL: 0.4000 mg | SUBLINGUAL_TABLET | SUBLINGUAL | Status: DC | PRN

## 2020-11-22 MED ORDER — MEMANTINE HCL 10 MG PO TABS
10.0000 mg | ORAL_TABLET | Freq: Two times a day (BID) | ORAL | Status: DC
  Administered 2020-11-22 – 2020-11-24 (×4): 10 mg via ORAL
  Filled 2020-11-22 (×5): qty 1

## 2020-11-22 MED ORDER — CLOPIDOGREL BISULFATE 75 MG PO TABS: 75.0000 mg | ORAL_TABLET | Freq: Every day | ORAL | Status: DC

## 2020-11-22 MED ORDER — LATANOPROST 0.005 % OP SOLN
1.0000 [drp] | Freq: Every day | OPHTHALMIC | Status: DC
  Administered 2020-11-22 – 2020-11-23 (×2): 1 [drp] via OPHTHALMIC
  Filled 2020-11-22: qty 2.5

## 2020-11-22 MED ORDER — SODIUM CHLORIDE 0.9 % IV BOLUS
1000.0000 mL | Freq: Once | INTRAVENOUS | Status: AC
  Administered 2020-11-22: 1000 mL via INTRAVENOUS

## 2020-11-22 MED ORDER — ADULT MULTIVITAMIN W/MINERALS CH
1.0000 | ORAL_TABLET | Freq: Every day | ORAL | Status: DC
  Administered 2020-11-23 – 2020-11-24 (×2): 1 via ORAL
  Filled 2020-11-22 (×2): qty 1

## 2020-11-22 MED ORDER — LORATADINE 10 MG PO TABS
10.0000 mg | ORAL_TABLET | Freq: Every day | ORAL | Status: DC
  Administered 2020-11-23 – 2020-11-24 (×2): 10 mg via ORAL
  Filled 2020-11-22 (×2): qty 1

## 2020-11-22 MED ORDER — BRIMONIDINE TARTRATE 0.15 % OP SOLN
1.0000 [drp] | Freq: Two times a day (BID) | OPHTHALMIC | Status: DC
  Administered 2020-11-22 – 2020-11-24 (×4): 1 [drp] via OPHTHALMIC
  Filled 2020-11-22: qty 5

## 2020-11-22 MED ORDER — FLUTICASONE-UMECLIDIN-VILANT 100-62.5-25 MCG/INH IN AEPB: 1.0000 | INHALATION_SPRAY | Freq: Every day | RESPIRATORY_TRACT | Status: DC

## 2020-11-22 MED ORDER — DIVALPROEX SODIUM 125 MG PO DR TAB
125.0000 mg | DELAYED_RELEASE_TABLET | Freq: Every day | ORAL | Status: DC
  Administered 2020-11-22 – 2020-11-23 (×2): 125 mg via ORAL
  Filled 2020-11-22 (×3): qty 1

## 2020-11-22 MED ORDER — FLUTICASONE FUROATE-VILANTEROL 100-25 MCG/INH IN AEPB
1.0000 | INHALATION_SPRAY | Freq: Every day | RESPIRATORY_TRACT | Status: DC
  Administered 2020-11-23 – 2020-11-24 (×2): 1 via RESPIRATORY_TRACT
  Filled 2020-11-22: qty 28

## 2020-11-22 MED ORDER — LEVALBUTEROL HCL 0.63 MG/3ML IN NEBU: 0.6300 mg | INHALATION_SOLUTION | Freq: Four times a day (QID) | RESPIRATORY_TRACT | Status: DC | PRN

## 2020-11-22 MED ORDER — PREDNISONE 5 MG PO TABS
7.5000 mg | ORAL_TABLET | Freq: Every day | ORAL | Status: DC
  Administered 2020-11-23 – 2020-11-24 (×2): 7.5 mg via ORAL
  Filled 2020-11-22 (×2): qty 2

## 2020-11-22 MED ORDER — DILTIAZEM HCL-DEXTROSE 125-5 MG/125ML-% IV SOLN (PREMIX)
5.0000 mg/h | INTRAVENOUS | Status: DC
  Administered 2020-11-22: 5 mg/h via INTRAVENOUS
  Filled 2020-11-22: qty 125

## 2020-11-22 MED ORDER — ESCITALOPRAM OXALATE 10 MG PO TABS
5.0000 mg | ORAL_TABLET | Freq: Every day | ORAL | Status: DC
  Administered 2020-11-23: 5 mg via ORAL
  Filled 2020-11-22: qty 1

## 2020-11-22 MED ORDER — PANTOPRAZOLE SODIUM 40 MG PO TBEC
40.0000 mg | DELAYED_RELEASE_TABLET | Freq: Two times a day (BID) | ORAL | Status: DC
  Administered 2020-11-22 – 2020-11-24 (×4): 40 mg via ORAL
  Filled 2020-11-22 (×4): qty 1

## 2020-11-22 MED ORDER — ENOXAPARIN SODIUM 40 MG/0.4ML IJ SOSY: 40.0000 mg | PREFILLED_SYRINGE | INTRAMUSCULAR | Status: DC

## 2020-11-22 MED ORDER — METOPROLOL TARTRATE 5 MG/5ML IV SOLN
2.5000 mg | Freq: Once | INTRAVENOUS | Status: AC
  Administered 2020-11-22: 2.5 mg via INTRAVENOUS

## 2020-11-22 MED ORDER — INSULIN ASPART 100 UNIT/ML IJ SOLN
0.0000 [IU] | Freq: Three times a day (TID) | INTRAMUSCULAR | Status: DC
  Administered 2020-11-22: 9 [IU] via SUBCUTANEOUS
  Administered 2020-11-23: 1 [IU] via SUBCUTANEOUS
  Administered 2020-11-23: 7 [IU] via SUBCUTANEOUS
  Administered 2020-11-23: 5 [IU] via SUBCUTANEOUS
  Administered 2020-11-24 (×2): 2 [IU] via SUBCUTANEOUS

## 2020-11-22 MED ORDER — IPRATROPIUM-ALBUTEROL 0.5-2.5 (3) MG/3ML IN SOLN
3.0000 mL | Freq: Three times a day (TID) | RESPIRATORY_TRACT | Status: DC
  Administered 2020-11-22 – 2020-11-23 (×2): 3 mL via RESPIRATORY_TRACT
  Filled 2020-11-22 (×2): qty 3

## 2020-11-22 MED ORDER — DONEPEZIL HCL 10 MG PO TABS
10.0000 mg | ORAL_TABLET | Freq: Every day | ORAL | Status: DC
  Administered 2020-11-23: 10 mg via ORAL
  Filled 2020-11-22: qty 1

## 2020-11-22 MED ORDER — VITAMIN D 25 MCG (1000 UNIT) PO TABS
2000.0000 [IU] | ORAL_TABLET | Freq: Every day | ORAL | Status: DC
  Administered 2020-11-23: 2000 [IU] via ORAL
  Filled 2020-11-22: qty 2

## 2020-11-22 MED ORDER — METOPROLOL TARTRATE 5 MG/5ML IV SOLN
INTRAVENOUS | Status: AC
  Filled 2020-11-22: qty 5

## 2020-11-22 MED ORDER — MIRABEGRON ER 50 MG PO TB24
50.0000 mg | ORAL_TABLET | Freq: Every evening | ORAL | Status: DC
  Administered 2020-11-22 – 2020-11-23 (×2): 50 mg via ORAL
  Filled 2020-11-22 (×3): qty 1

## 2020-11-22 MED ORDER — UMECLIDINIUM BROMIDE 62.5 MCG/INH IN AEPB
1.0000 | INHALATION_SPRAY | Freq: Every day | RESPIRATORY_TRACT | Status: DC
  Administered 2020-11-23 – 2020-11-24 (×2): 1 via RESPIRATORY_TRACT
  Filled 2020-11-22: qty 7

## 2020-11-22 MED ORDER — MONTELUKAST SODIUM 10 MG PO TABS
10.0000 mg | ORAL_TABLET | Freq: Every day | ORAL | Status: DC
  Administered 2020-11-23 – 2020-11-24 (×2): 10 mg via ORAL
  Filled 2020-11-22 (×2): qty 1

## 2020-11-22 MED ORDER — ACETAMINOPHEN 650 MG RE SUPP: 650.0000 mg | Freq: Four times a day (QID) | RECTAL | Status: DC | PRN

## 2020-11-22 MED ORDER — FINASTERIDE 5 MG PO TABS
5.0000 mg | ORAL_TABLET | Freq: Every evening | ORAL | Status: DC
  Administered 2020-11-22 – 2020-11-23 (×2): 5 mg via ORAL
  Filled 2020-11-22 (×2): qty 1

## 2020-11-22 NOTE — ED Notes (Signed)
Pt given water and apple juice.

## 2020-11-22 NOTE — Consult Note (Signed)
CARDIOLOGY CONSULT NOTE  Patient ID: [name] MRN: [id number] DOB/AGE: [birth year] 87 y.o.  Admit date: 11/22/2020 Referring Physician: [facility] hospitalist Reason for Consultation:  Syncope  HPI:   87 y/o [place] male with hypertension, type 2 DM, CAD, remote h/o TIA, dementia, admitted with syncope  Patient's wife and daughter are at bedside, son is a Engineer, drilling. Patient has moderate dementia, has 24X7 care at home. He enjoys sitting in the sun, wife admits to him not drinking much fluid through the day. Today, he became unresponsive with wife present. No classic syncope history, no loss of pulse. EMS arrived, he was hypotensive in 70s/50s. He was brought to ER. EKG showed sinus bradycardia in 50s. Since being on the floor, he has been tachycardic in 130s. He denies any symptoms at this time, sitting comfortably in bed and eating his dinner. Lactic acid is elevated at 3.8.   Past Medical History:  Diagnosis Date   Anemia    Asthma    Benign prostatic hypertrophy    Bilateral sensorineural hearing loss    CAD (coronary artery disease) 07/2003   a.  3.0 x 28 mm Taxus DES in the mid RCA in 2005   COPD (chronic obstructive pulmonary disease) (HCC)    Diverticulosis    DJD (degenerative joint disease)    DM (diabetes mellitus) (HCC)    Dyslipidemia    GERD (gastroesophageal reflux disease)    Hemorrhoids    Hiatal hernia    Hypertension    OSA (obstructive sleep apnea)    cpap use   Osteoarthritis    Overactive bladder    Rhinitis    Seasonal allergies    Shingles    Stroke [facility])    TIA (transient ischemic attack) 12/2006   Dr. Leonie Man     Past Surgical History:  Procedure Laterality Date   BACK SURGERY     lower back   Cataract surgery     1 eye done.   CORONARY ANGIOPLASTY WITH STENT PLACEMENT  07/2003   Est. EF of 65% with stent to the RCA, by Dr. Acie Fredrickson   CYSTOSCOPY WITH INSERTION OF UROLIFT N/A 02/25/2016   Procedure: CYSTOSCOPY WITH INSERTION OF UROLIFT x6;  Surgeon:  Cleon Gustin, MD;  Location: [facility];  Service: Urology;  Laterality: N/A;  1 HOUR     I & D EXTREMITY Left 11/10/2019   Procedure: IRRIGATION AND DEBRIDEMENT EXTREMITY, left olecranon bursa;  Surgeon: Renette Butters, MD;  Location: [place];  Service: Orthopedics;  Laterality: Left;   INNER EAR SURGERY     JOINT REPLACEMENT Right    L5 laminectomy  2004   Dr. Sherwood Gambler   right total knee  2006   Dr. Maureen Ralphs      Family History  Problem Relation Age of Onset   Lung disease Father    Asthma Father    Sudden death Mother        unknown causes   Lung disease Brother    Asthma Brother    Sudden death Brother 63       unknown causes   Sudden death Sister        unknown causes   Sudden death Sister        unknown causes     Social History: Social History   Socioeconomic History   Marital status: Married    Spouse name: Not on file   Number of children: 4   Years of education: 16+   Highest education level:  Not on file  Occupational History   Occupation: retired professor  Tobacco Use   Smoking status: Former    Packs/day: 1.00    Years: 43.00    Pack years: 43.00    Types: Cigarettes    Quit date: 05/02/1988    Years since quitting: 32.5   Smokeless tobacco: Never  Substance and Sexual Activity   Alcohol use: Yes    Comment: RARE-social   Drug use: No   Sexual activity: Not Currently  Other Topics Concern   Not on file  Social History Narrative   Lives w/ wife   Caffeine use:      Social Determinants of Health   Financial Resource Strain: Not on file  Food Insecurity: Not on file  Transportation Needs: Not on file  Physical Activity: Not on file  Stress: Not on file  Social Connections: Not on file  Intimate Partner Violence: Not on file     Medications Prior to Admission  Medication Sig Dispense Refill Last Dose   acetaminophen (TYLENOL) 325 MG tablet Take 2 tablets (650 mg total) by mouth every 6 (six) hours as needed for moderate pain. 40 tablet  0    albuterol (PROVENTIL HFA;VENTOLIN HFA) 108 (90 Base) MCG/ACT inhaler Inhale 2 puffs into the lungs every 6 (six) hours as needed for wheezing or shortness of breath.      albuterol (PROVENTIL) (2.5 MG/3ML) 0.083% nebulizer solution Take 2.5 mg by nebulization 2 (two) times daily.       amLODipine (NORVASC) 5 MG tablet Take 5 mg by mouth daily.       amLODipine (NORVASC) 5 MG tablet Take 5 mg by mouth 2 (two) times daily.      aspirin EC 81 MG tablet Take 81 mg by mouth daily with supper.      aspirin EC 81 MG tablet Take 81 mg by mouth daily. Swallow whole.      atorvastatin (LIPITOR) 20 MG tablet Take 20 mg by mouth daily.       atorvastatin (LIPITOR) 20 MG tablet Take 20 mg by mouth daily.      brimonidine (ALPHAGAN) 0.15 % ophthalmic solution Place 1 drop into the left eye 2 (two) times daily.      brimonidine (ALPHAGAN) 0.2 % ophthalmic solution Place 1 drop into both eyes 2 (two) times daily.      budesonide (PULMICORT) 0.25 MG/2ML nebulizer solution Take 2 mLs (0.25 mg total) by nebulization 2 (two) times daily. 60 mL 12    Cholecalciferol (VITAMIN D) 2000 units CAPS Take 2,000 Units by mouth daily with supper.       Cholecalciferol (VITAMIN D) 50 MCG (2000 UT) CAPS Take 2,000 Units by mouth every evening.      clopidogrel (PLAVIX) 75 MG tablet Take 1 tablet (75 mg total) by mouth daily. 30 tablet 0    clopidogrel (PLAVIX) 75 MG tablet Take 75 mg by mouth daily.      divalproex (DEPAKOTE) 125 MG DR tablet Take 125 mg by mouth at bedtime.      donepezil (ARICEPT) 10 MG tablet Take 10 mg by mouth daily with supper.  3    donepezil (ARICEPT) 10 MG tablet Take 10 mg by mouth every evening.      escitalopram (LEXAPRO) 5 MG tablet Take 5 mg by mouth daily with supper.  2    escitalopram (LEXAPRO) 5 MG tablet Take 5 mg by mouth every evening.      finasteride (PROSCAR) 5 MG tablet  Take 5 mg by mouth daily with supper.       finasteride (PROSCAR) 5 MG tablet Take 5 mg by mouth every evening.       Fluticasone-Umeclidin-Vilant (TRELEGY ELLIPTA) 100-62.5-25 MCG/INH AEPB Inhale 1 puff into the lungs daily.      furosemide (LASIX) 40 MG tablet Take 1 tablet (40 mg total) by mouth daily. 30 tablet 0    furosemide (LASIX) 40 MG tablet Take 40 mg by mouth daily.      HUMALOG KWIKPEN 100 UNIT/ML KwikPen Inject 0-6 Units into the skin in the morning, at noon, and at bedtime.      hydrOXYzine (ATARAX/VISTARIL) 25 MG tablet Take 25 mg by mouth 2 (two) times daily as needed for itching.      insulin lispro (HUMALOG KWIKPEN) 100 UNIT/ML KiwkPen Inject 2-5 Units into the skin See admin instructions. 2-5 units per sliding scale      ipratropium-albuterol (DUONEB) 0.5-2.5 (3) MG/3ML SOLN Take 3 mLs by nebulization in the morning, at noon, and at bedtime. 360 mL 0    ipratropium-albuterol (DUONEB) 0.5-2.5 (3) MG/3ML SOLN Take 3 mLs by nebulization in the morning, at noon, and at bedtime.      latanoprost (XALATAN) 0.005 % ophthalmic solution Place 1 drop into both eyes at bedtime.      latanoprost (XALATAN) 0.005 % ophthalmic solution Place 1 drop into both eyes at bedtime.      loratadine (CLARITIN) 10 MG tablet Take 10 mg by mouth daily.       loratadine (CLARITIN) 10 MG tablet Take 10 mg by mouth daily.      memantine (NAMENDA) 10 MG tablet Take 10 mg by mouth 2 (two) times daily.  3    memantine (NAMENDA) 10 MG tablet Take 10 mg by mouth 2 (two) times daily.      mirabegron ER (MYRBETRIQ) 50 MG TB24 tablet Take 50 mg by mouth daily with supper.      montelukast (SINGULAIR) 10 MG tablet Take 10 mg by mouth daily.       montelukast (SINGULAIR) 10 MG tablet Take 10 mg by mouth daily.      Multiple Vitamin (MULTIVITAMIN WITH MINERALS) TABS tablet Take 1 tablet by mouth daily.      Multiple Vitamin (MULTIVITAMIN WITH MINERALS) TABS tablet Take 1 tablet by mouth daily.      MYRBETRIQ 50 MG TB24 tablet Take 50 mg by mouth every evening.      nitroGLYCERIN (NITROSTAT) 0.4 MG SL tablet Place 0.4 mg under  the tongue every 5 (five) minutes as needed for chest pain.       pantoprazole (PROTONIX) 40 MG tablet Take 40 mg by mouth 2 (two) times daily.       pantoprazole (PROTONIX) 40 MG tablet Take 40 mg by mouth 2 (two) times daily.      predniSONE (DELTASONE) 2.5 MG tablet Take 7.5 mg by mouth daily with breakfast.      predniSONE (DELTASONE) 5 MG tablet Take 7.5 mg by mouth daily with breakfast.       sitaGLIPtin-metformin (JANUMET) 50-1000 MG tablet Take 1 tablet by mouth 2 (two) times daily with a meal.        TRELEGY ELLIPTA 100-62.5-25 MCG/INH AEPB Inhale 1 puff into the lungs daily.      vitamin C (ASCORBIC ACID) 500 MG tablet Take 500 mg by mouth daily with supper.        Review of Systems  Unable to perform ROS: Dementia  Denies any complaints at this time  Physical Exam: Physical Exam Vitals and nursing note reviewed.  Constitutional:      General: He is not in acute distress.    Appearance: He is well-developed.  HENT:     Head: Normocephalic and atraumatic.  Eyes:     Conjunctiva/sclera: Conjunctivae normal.     Pupils: Pupils are equal, round, and reactive to light.  Neck:     Vascular: No JVD.  Cardiovascular:     Rate and Rhythm: Regular rhythm. Tachycardia present.     Pulses: Normal pulses and intact distal pulses.     Heart sounds: No murmur heard. Pulmonary:     Effort: Pulmonary effort is normal.     Breath sounds: Normal breath sounds. No wheezing or rales.  Abdominal:     General: Bowel sounds are normal.     Palpations: Abdomen is soft.     Tenderness: There is no rebound.  Musculoskeletal:        General: No tenderness. Normal range of motion.     Right lower leg: No edema.     Left lower leg: No edema.  Lymphadenopathy:     Cervical: No cervical adenopathy.  Skin:    General: Skin is warm and dry.  Neurological:     Mental Status: He is alert.     Cranial Nerves: No cranial nerve deficit.     Comments: Baseline dementia     Labs:   Lab  Results  Component Value Date   WBC 13.9 (H) 11/22/2020   HGB 8.4 (L) 11/22/2020   HCT 26.0 (L) 11/22/2020   MCV 81.3 11/22/2020   PLT 160 11/22/2020    Recent Labs  Lab 11/22/20 1104  NA 136  K 4.1  CL 105  CO2 24  BUN 40*  CREATININE 1.99*  CALCIUM 8.0*  PROT 4.8*  BILITOT 0.4  ALKPHOS 69  ALT 15  AST 13*  GLUCOSE 316*    Lipid Panel     Component Value Date/Time   CHOL 109 01/03/2012 1305   TRIG 44.0 01/03/2012 1305   HDL 60.20 01/03/2012 1305   CHOLHDL 2 01/03/2012 1305   VLDL 8.8 01/03/2012 1305   LDLCALC 40 01/03/2012 1305    BNP (last 3 results) Recent Labs    11/22/20 1141  BNP 151.3*    HEMOGLOBIN A1C Lab Results  Component Value Date   HGBA1C 9.4 (H) 11/07/2019   MPG 223.08 11/07/2019    Cardiac Panel (last 3 results) No results for input(s): CKTOTAL, CKMB, RELINDX in the last 8760 hours.  Invalid input(s): TROPONINHS  Lab Results  Component Value Date   CKTOTAL 78 04/06/2007   CKMB 3.9 04/06/2007     TSH Recent Labs    11/13/20 1145 11/22/20 1550  TSH 5.912* 2.839      Radiology: DG Chest 1 View  Result Date: 11/22/2020 CLINICAL DATA:  Weakness. EXAM: CHEST  1 VIEW COMPARISON:  November 13, 2020. FINDINGS: The heart size and mediastinal contours are within normal limits. Both lungs are clear. The visualized skeletal structures are unremarkable. IMPRESSION: No active disease. Electronically Signed   By: Marijo Conception M.D.   On: 11/22/2020 12:12   CT Head Wo Contrast  Result Date: 11/22/2020 CLINICAL DATA:  Weakness.  Syncope. EXAM: CT HEAD WITHOUT CONTRAST TECHNIQUE: Contiguous axial images were obtained from the base of the skull through the vertex without intravenous contrast. COMPARISON:  11/13/2020 FINDINGS: Brain: Motion degraded study shows no evidence for  acute hemorrhage, hydrocephalus, mass lesion, or abnormal extra-axial fluid collection. Diffuse loss of parenchymal volume is consistent with atrophy. Patchy low  attenuation in the deep hemispheric and periventricular white matter is nonspecific, but likely reflects chronic microvascular ischemic demyelination. Lacunar infarct again noted right thalamus and left subinsular white matter. Vascular: No hyperdense vessel or unexpected calcification. Skull: No evidence for fracture. No worrisome lytic or sclerotic lesion. Sinuses/Orbits: Focal thickening posterior right ethmoid sinus is similar to prior. Remaining visualized paranasal sinuses are clear. Postoperative changes noted right mastoid region. Visualized portions of the globes and intraorbital fat are unremarkable. Other: None. IMPRESSION: 1. Stable exam.  No acute intracranial abnormality. 2. Atrophy with chronic small vessel white matter ischemic disease. Electronically Signed   By: Misty Stanley M.D.   On: 11/22/2020 12:48    Scheduled Meds:  enoxaparin (LOVENOX) injection  30 mg Subcutaneous Q24H   insulin aspart  0-9 Units Subcutaneous TID WC   Continuous Infusions:  lactated ringers     PRN Meds:.acetaminophen **OR** acetaminophen  CARDIAC STUDIES:  EKG 11/22/2020: Sinus bradycardia 50 bpm with occasional PAC  Telemetry 11/22/2020: Narrow complex, regular tachcyardia Brief slowing down with IV metoprolol 2.5 mg IV Possible atypical atrial flutter   Media Information        Echocardiogram 11/07/2019:  1. Left ventricular ejection fraction, by estimation, is 65 to 70%. Left  ventricular ejection fraction by PLAX is 64 %. The left ventricle has  normal function. The left ventricle has no regional wall motion  abnormalities. There is moderate left  ventricular hypertrophy. Left ventricular diastolic parameters are  consistent with Grade II diastolic dysfunction (pseudonormalization).  Elevated left ventricular end-diastolic pressure.   2. Right ventricular systolic function is normal. The right ventricular  size is normal.   3. The mitral valve is degenerative. No evidence of mitral  valve  regurgitation.   4. The aortic valve is tricuspid. Aortic valve regurgitation is not  visualized.    Assessment & Recommendations:  87 y/o [place] male with hypertension, type 2 DM, CAD, dementia, admitted with syncope  Syncope: Differentials include dehydration, orthostatic hypotension, tachybrady syndrome. Currently in 130s, possibly atypical atrial flutter vs atrial tachycardia. Started IV diltiazem, hold if HR <80 CHA2DS2VASc score 6, annual stroke risk 9%. IV heparin for now.  Unsure about long term anticoagulation.  Troponin elevation: Type 2 MI in the setting of syncope, now tachycardia. Not ACS  While BNP is mildly elevated, he does not appear to be in clinical heart failure.   I spoke with his [name]. If arrhythmia is etiology of recurrent syncope, pacemaker would be warranted. However, this would need to be balanced with his overall goals of care in the setting of moderate dementia. We mutually agreed on medical management with IV diltiazem and IV heparin for now, obtain more telemetry readings and go from there,.     Nigel Mormon, MD Pager: [phone] Office: [phone]

## 2020-11-22 NOTE — ED Provider Notes (Signed)
[facility] EMERGENCY DEPARTMENT Provider Note   CSN: [id number] Arrival date & time: 11/22/20  1020     History Chief Complaint  Patient presents with   Weakness   Syncope    [name] is a 87 y.o. male.  The history is provided by the spouse, the patient and a caregiver.  Loss of Consciousness Episode history:  Multiple Most recent episode:  Today Progression:  Resolved Chronicity:  New Context: normal activity   Witnessed: yes   Relieved by:  Nothing Worsened by:  Nothing Associated symptoms: diaphoresis, malaise/fatigue, recent fall and weakness   Associated symptoms: no chest pain, no dizziness, no fever, no focal weakness, no headaches, no palpitations, no seizures, no shortness of breath and no vomiting   Risk factors: no seizures       Past Medical History:  Diagnosis Date   Anemia    Asthma    Benign prostatic hypertrophy    Bilateral sensorineural hearing loss    CAD (coronary artery disease) 07/2003   a.  3.0 x 28 mm Taxus DES in the mid RCA in 2005   COPD (chronic obstructive pulmonary disease) (HCC)    Diverticulosis    DJD (degenerative joint disease)    DM (diabetes mellitus) ([place])    Dyslipidemia    GERD (gastroesophageal reflux disease)    Hemorrhoids    Hiatal hernia    Hypertension    OSA (obstructive sleep apnea)    cpap use   Osteoarthritis    Overactive bladder    Rhinitis    Seasonal allergies    Shingles    Stroke ([place])    TIA (transient ischemic attack) 12/2006   Dr. Leonie Man    Patient Active Problem List   Diagnosis Date Noted   Acute on chronic diastolic CHF (congestive heart failure) ([place]) 11/06/2019   CHF (congestive heart failure) ([place]) 11/06/2019   Acute renal failure ([place])    Hyperkalemia    Fall 05/07/2017   Alzheimer's dementia without behavioral disturbance ([place]) 10/10/2015   Dementia ([place]) 04/22/2015   Alzheimer's disease ([place]) 04/22/2015   Community acquired pneumonia 02/24/2013    Class:  Acute   Iatrogenic adrenal insufficiency ([place]) 02/24/2013    Class: Chronic   Type II or unspecified type diabetes mellitus with neurological manifestations, not stated as uncontrolled(250.60) 01/04/2013    Class: Chronic   Coronary artery disease 01/03/2012   OSA (obstructive sleep apnea) 03/13/2007   COPD (chronic obstructive pulmonary disease) ([place]) 03/13/2007    Past Surgical History:  Procedure Laterality Date   BACK SURGERY     lower back   Cataract surgery     1 eye done.   CORONARY ANGIOPLASTY WITH STENT PLACEMENT  07/2003   Est. EF of 65% with stent to the RCA, by Dr. Acie Fredrickson   CYSTOSCOPY WITH INSERTION OF UROLIFT N/A 02/25/2016   Procedure: CYSTOSCOPY WITH INSERTION OF UROLIFT x6;  Surgeon: Cleon Gustin, MD;  Location: [facility];  Service: Urology;  Laterality: N/A;  1 HOUR     I & D EXTREMITY Left 11/10/2019   Procedure: IRRIGATION AND DEBRIDEMENT EXTREMITY, left olecranon bursa;  Surgeon: Renette Butters, MD;  Location: [facility];  Service: Orthopedics;  Laterality: Left;   INNER EAR SURGERY     JOINT REPLACEMENT Right    L5 laminectomy  2004   Dr. Sherwood Gambler   right total knee  2006   Dr. Maureen Ralphs       Family History  Problem Relation  Age of Onset   Lung disease Father    Asthma Father    Sudden death Mother        unknown causes   Lung disease Brother    Asthma Brother    Sudden death Brother 30       unknown causes   Sudden death Sister        unknown causes   Sudden death Sister        unknown causes    Social History   Tobacco Use   Smoking status: Former    Packs/day: 1.00    Years: 43.00    Pack years: 43.00    Types: Cigarettes    Quit date: 05/02/1988    Years since quitting: 32.5   Smokeless tobacco: Never  Substance Use Topics   Alcohol use: Yes    Comment: RARE-social   Drug use: No    Home Medications Prior to Admission medications   Medication Sig Start Date End Date Taking? Authorizing Provider  acetaminophen (TYLENOL) 325 MG  tablet Take 2 tablets (650 mg total) by mouth every 6 (six) hours as needed for moderate pain. 11/13/19   Arrien, Jimmy Picket, MD  albuterol (PROVENTIL HFA;VENTOLIN HFA) 108 (90 Base) MCG/ACT inhaler Inhale 2 puffs into the lungs every 6 (six) hours as needed for wheezing or shortness of breath.    [provider]  albuterol (PROVENTIL) (2.5 MG/3ML) 0.083% nebulizer solution Take 2.5 mg by nebulization 2 (two) times daily.     [provider]  amLODipine (NORVASC) 5 MG tablet Take 5 mg by mouth daily.     [provider]  amLODipine (NORVASC) 5 MG tablet Take 5 mg by mouth 2 (two) times daily. 11/07/20   [provider]  aspirin EC 81 MG tablet Take 81 mg by mouth daily with supper.    [provider]  aspirin EC 81 MG tablet Take 81 mg by mouth daily. Swallow whole.    [provider]  atorvastatin (LIPITOR) 20 MG tablet Take 20 mg by mouth daily.     [provider]  atorvastatin (LIPITOR) 20 MG tablet Take 20 mg by mouth daily. 06/05/20   [provider]  brimonidine (ALPHAGAN) 0.15 % ophthalmic solution Place 1 drop into the left eye 2 (two) times daily. 10/29/19   [provider]  brimonidine (ALPHAGAN) 0.2 % ophthalmic solution Place 1 drop into both eyes 2 (two) times daily. 09/17/20   [provider]  budesonide (PULMICORT) 0.25 MG/2ML nebulizer solution Take 2 mLs (0.25 mg total) by nebulization 2 (two) times daily. 11/13/19   Arrien, Jimmy Picket, MD  Cholecalciferol (VITAMIN D) 2000 units CAPS Take 2,000 Units by mouth daily with supper.     [provider]  Cholecalciferol (VITAMIN D) 50 MCG (2000 UT) CAPS Take 2,000 Units by mouth every evening.    [provider]  clopidogrel (PLAVIX) 75 MG tablet Take 1 tablet (75 mg total) by mouth daily. 02/29/16   McKenzie, Candee Furbish, MD  clopidogrel (PLAVIX) 75 MG tablet Take 75 mg by mouth daily. 08/22/20   [provider]  divalproex  (DEPAKOTE) 125 MG DR tablet Take 125 mg by mouth at bedtime. 07/24/20   [provider]  donepezil (ARICEPT) 10 MG tablet Take 10 mg by mouth daily with supper. 04/08/17   [provider]  donepezil (ARICEPT) 10 MG tablet Take 10 mg by mouth every evening. 11/07/20   [provider]  escitalopram (LEXAPRO) 5  MG tablet Take 5 mg by mouth daily with supper. 02/13/17   [provider]  escitalopram (LEXAPRO) 5 MG tablet Take 5 mg by mouth every evening. 11/07/20   [provider]  finasteride (PROSCAR) 5 MG tablet Take 5 mg by mouth daily with supper.  04/16/15   [provider]  finasteride (PROSCAR) 5 MG tablet Take 5 mg by mouth every evening. 08/22/20   [provider]  Fluticasone-Umeclidin-Vilant (TRELEGY ELLIPTA) 100-62.5-25 MCG/INH AEPB Inhale 1 puff into the lungs daily.    [provider]  furosemide (LASIX) 40 MG tablet Take 1 tablet (40 mg total) by mouth daily. 11/14/19 12/14/19  Arrien, Jimmy Picket, MD  furosemide (LASIX) 40 MG tablet Take 40 mg by mouth daily. 09/02/20   [provider]  HUMALOG KWIKPEN 100 UNIT/ML KwikPen Inject 0-6 Units into the skin in the morning, at noon, and at bedtime. 10/24/20   [provider]  hydrOXYzine (ATARAX/VISTARIL) 25 MG tablet Take 25 mg by mouth 2 (two) times daily as needed for itching. 07/21/20   [provider]  insulin lispro (HUMALOG KWIKPEN) 100 UNIT/ML KiwkPen Inject 2-5 Units into the skin See admin instructions. 2-5 units per sliding scale    [provider]  ipratropium-albuterol (DUONEB) 0.5-2.5 (3) MG/3ML SOLN Take 3 mLs by nebulization in the morning, at noon, and at bedtime. 11/13/19   Arrien, Jimmy Picket, MD  ipratropium-albuterol (DUONEB) 0.5-2.5 (3) MG/3ML SOLN Take 3 mLs by nebulization in the morning, at noon, and at bedtime. 08/12/20   [provider]  latanoprost (XALATAN) 0.005 % ophthalmic solution Place 1 drop into both  eyes at bedtime. 06/20/19   [provider]  latanoprost (XALATAN) 0.005 % ophthalmic solution Place 1 drop into both eyes at bedtime. 09/17/20   [provider]  loratadine (CLARITIN) 10 MG tablet Take 10 mg by mouth daily.     [provider]  loratadine (CLARITIN) 10 MG tablet Take 10 mg by mouth daily.    [provider]  memantine (NAMENDA) 10 MG tablet Take 10 mg by mouth 2 (two) times daily. 03/18/17   [provider]  memantine (NAMENDA) 10 MG tablet Take 10 mg by mouth 2 (two) times daily. 08/22/20   [provider]  mirabegron ER (MYRBETRIQ) 50 MG TB24 tablet Take 50 mg by mouth daily with supper.    [provider]  montelukast (SINGULAIR) 10 MG tablet Take 10 mg by mouth daily.     [provider]  montelukast (SINGULAIR) 10 MG tablet Take 10 mg by mouth daily. 11/07/20   [provider]  Multiple Vitamin (MULTIVITAMIN WITH MINERALS) TABS tablet Take 1 tablet by mouth daily.    [provider]  Multiple Vitamin (MULTIVITAMIN WITH MINERALS) TABS tablet Take 1 tablet by mouth daily.    [provider]  MYRBETRIQ 50 MG TB24 tablet Take 50 mg by mouth every evening. 08/31/20   [provider]  nitroGLYCERIN (NITROSTAT) 0.4 MG SL tablet Place 0.4 mg under the tongue every 5 (five) minutes as needed for chest pain.     [provider]  pantoprazole (PROTONIX) 40 MG tablet Take 40 mg by mouth 2 (two) times daily.     [provider]  pantoprazole (PROTONIX) 40 MG tablet Take 40 mg by mouth 2 (two) times daily. 11/07/20   [provider]  predniSONE (DELTASONE) 2.5 MG tablet Take 7.5 mg by mouth daily with breakfast.    [provider]  predniSONE (DELTASONE) 5 MG tablet Take 7.5 mg by mouth daily with breakfast.     [provider]  sitaGLIPtin-metformin (JANUMET) 50-1000 MG tablet Take 1 tablet by mouth 2 (two) times daily with a meal.      [provider]  TRELEGY ELLIPTA 100-62.5-25 MCG/INH AEPB Inhale 1 puff into the lungs daily. 10/27/20   [provider]  vitamin C (ASCORBIC ACID) 500 MG tablet Take 500 mg by mouth daily with supper.     [provider]    Allergies    Ciprofloxacin, Ciprofloxacin hcl, Mirabegron, Penicillins, Penicillins, Sulfa antibiotics, Sulfamethoxazole, and Sulfonamide derivatives  Review of Systems   Review of Systems  Constitutional:  Positive for diaphoresis and malaise/fatigue. Negative for chills and fever.  HENT:  Negative for ear pain and sore throat.   Eyes:  Negative for pain and visual disturbance.  Respiratory:  Negative for cough and shortness of breath.   Cardiovascular:  Positive for syncope. Negative for chest pain and palpitations.  Gastrointestinal:  Negative for abdominal pain and vomiting.  Genitourinary:  Negative for dysuria and hematuria.  Musculoskeletal:  Negative for arthralgias and back pain.  Skin:  Negative for color change and rash.  Neurological:  Positive for weakness. Negative for dizziness, focal weakness, seizures, syncope, light-headedness, numbness and headaches.  All other systems reviewed and are negative.  Physical Exam Updated Vital Signs BP (!) 95/54 (BP Location: Left Arm)   Pulse (!) 52   Temp (!) 95.8 F (35.4 C) (Rectal)   Resp 14   SpO2 96%   Physical Exam Vitals and nursing note reviewed.  Constitutional:      General: He is not in acute distress.    Appearance: He is well-developed. He is not ill-appearing.  HENT:     Head: Normocephalic and atraumatic.     Nose: Nose normal.     Mouth/Throat:     Mouth: Mucous membranes are moist.  Eyes:     Extraocular Movements: Extraocular movements intact.     Conjunctiva/sclera: Conjunctivae normal.     Pupils: Pupils are equal, round, and reactive to light.  Cardiovascular:     Rate and Rhythm: Normal rate and regular rhythm.     Pulses: Normal pulses.     Heart sounds:  Normal heart sounds. No murmur heard. Pulmonary:     Effort: Pulmonary effort is normal. No respiratory distress.     Breath sounds: Normal breath sounds.  Abdominal:     Palpations: Abdomen is soft.     Tenderness: There is no abdominal tenderness.  Musculoskeletal:     Cervical back: Normal range of motion and neck supple.  Skin:    General: Skin is warm and dry.     Capillary Refill: Capillary refill takes less than 2 seconds.  Neurological:     General: No focal deficit present.     Mental Status: He is alert.     Cranial Nerves: No cranial nerve deficit.     Sensory: No sensory deficit.     Motor: No weakness.     Coordination: Coordination normal.     Comments: 5+ out of 5 strength, normal sensation, no drift, normal finger-nose-finger, normal speech    ED Results / Procedures / Treatments   Labs (all labs ordered are listed, but only abnormal results are displayed) Labs Reviewed  CBC WITH DIFFERENTIAL/PLATELET - Abnormal; Notable for the following components:      Result Value   WBC 13.9 (*)  RBC 3.20 (*)    Hemoglobin 8.4 (*)    HCT 26.0 (*)    Neutro Abs 10.5 (*)    Monocytes Absolute 1.1 (*)    Eosinophils Absolute 0.7 (*)    Abs Immature Granulocytes 0.20 (*)    All other components within normal limits  BASIC METABOLIC PANEL - Abnormal; Notable for the following components:   Glucose, Bld 316 (*)    BUN 40 (*)    Creatinine, Ser 1.99 (*)    Calcium 8.0 (*)    GFR, Estimated 32 (*)    All other components within normal limits  HEPATIC FUNCTION PANEL - Abnormal; Notable for the following components:   Total Protein 4.8 (*)    Albumin 2.3 (*)    AST 13 (*)    All other components within normal limits  BRAIN NATRIURETIC PEPTIDE - Abnormal; Notable for the following components:   B Natriuretic Peptide 151.3 (*)    All other components within normal limits  TROPONIN I (HIGH SENSITIVITY) - Abnormal; Notable for the following components:   Troponin I (High  Sensitivity) 20 (*)    All other components within normal limits  RESP PANEL BY RT-PCR (FLU A&B, COVID) ARPGX2  LACTIC ACID, PLASMA  T4, FREE  URINALYSIS, ROUTINE W REFLEX MICROSCOPIC  LACTIC ACID, PLASMA  TROPONIN I (HIGH SENSITIVITY)    EKG EKG Interpretation  Date/Time:  Sunday November 22 2020 12:07:38 EDT Ventricular Rate:  50 PR Interval:  175 QRS Duration: 87 QT Interval:  495 QTC Calculation: 452 R Axis:   33 Text Interpretation: Sinus rhythm Atrial premature complexes Abnormal R-wave progression, early transition Borderline repolarization abnormality Confirmed by Lennice Sites ([id number]) on 11/22/2020 12:22:03 PM  Radiology DG Chest 1 View  Result Date: 11/22/2020 CLINICAL DATA:  Weakness. EXAM: CHEST  1 VIEW COMPARISON:  November 13, 2020. FINDINGS: The heart size and mediastinal contours are within normal limits. Both lungs are clear. The visualized skeletal structures are unremarkable. IMPRESSION: No active disease. Electronically Signed   By: Marijo Conception M.D.   On: 11/22/2020 12:12   CT Head Wo Contrast  Result Date: 11/22/2020 CLINICAL DATA:  Weakness.  Syncope. EXAM: CT HEAD WITHOUT CONTRAST TECHNIQUE: Contiguous axial images were obtained from the base of the skull through the vertex without intravenous contrast. COMPARISON:  11/13/2020 FINDINGS: Brain: Motion degraded study shows no evidence for acute hemorrhage, hydrocephalus, mass lesion, or abnormal extra-axial fluid collection. Diffuse loss of parenchymal volume is consistent with atrophy. Patchy low attenuation in the deep hemispheric and periventricular white matter is nonspecific, but likely reflects chronic microvascular ischemic demyelination. Lacunar infarct again noted right thalamus and left subinsular white matter. Vascular: No hyperdense vessel or unexpected calcification. Skull: No evidence for fracture. No worrisome lytic or sclerotic lesion. Sinuses/Orbits: Focal thickening posterior right ethmoid sinus is  similar to prior. Remaining visualized paranasal sinuses are clear. Postoperative changes noted right mastoid region. Visualized portions of the globes and intraorbital fat are unremarkable. Other: None. IMPRESSION: 1. Stable exam.  No acute intracranial abnormality. 2. Atrophy with chronic small vessel white matter ischemic disease. Electronically Signed   By: Misty Stanley M.D.   On: 11/22/2020 12:48    Procedures Procedures   Medications Ordered in ED Medications  sodium chloride 0.9 % bolus 1,000 mL (has no administration in time range)    ED Course  I have reviewed the triage vital signs and the nursing notes.  Pertinent labs & imaging results that were available during my care  of the patient were reviewed by me and considered in my medical decision making (see chart for details).    MDM Rules/Calculators/A&P                           [name] is here after syncopal event.  History of dementia, TIA, COPD, high cholesterol, CAD.  Blood pressure 95/54, heart rate in the 50s that was sinus rhythm on EKG.  Known sinus bradycardia.  Had maybe 1 or 2 syncopal events at home today.  Possibly orthostatic.  Had similar event about a week or so ago.  Blood pressure medication was stopped since orthostatic and syncopal event last week.  Amlodipine 5 mg was restarted, home dose 10 mg.  Now today with 2 episodes of loss of consciousness.  He was too weak to stand up per caregiver.  Seemed to possibly lose consciousness just sitting in bed as well.  He is not on any other rate control medication.  Does not have fever.  Mildly hypothermic which son states is baseline.  We will pursue infectious work-up including cardiac work-up and head CT.  Overall given the multiple syncopal events here recently will admit for observation.  Believe he would benefit from telemetry and echocardiogram medication review.  Orthostatics here are normal.  Lab work is overall unremarkable.  Troponin mildly elevated at 20.   Free T4 is normal.  Lactic acid normal.  Head CT and chest x-ray without any acute findings.  No significant leukocytosis or anemia.  Overall given multiple syncopal events will admit for further work-up.  This chart was dictated using voice recognition software.  Despite best efforts to proofread,  errors can occur which can change the documentation meaning.   Final Clinical Impression(s) / ED Diagnoses Final diagnoses:  Syncope, unspecified syncope type    Rx / DC Orders ED Discharge Orders     None        Lennice Sites, DO 11/22/20 1336

## 2020-11-22 NOTE — ED Triage Notes (Signed)
Pt BIB [facility] from home d/t weakness & syncopal episode that happened while EMS was on scene. EMS reports pt was unable to stand and pivot into his w/c at home per baseline & then had a syncopal episode when they performed orthostatics. At supine pt was 80/50 & (no sit/stand was obtained) upon arrival to ED BP was 103/45.  61 bpm, 96% RA, CBG 391, 96.7 T

## 2020-11-22 NOTE — Progress Notes (Signed)
   11/22/20 1812  Assess: MEWS Score  Temp (!) 97.4 F (36.3 C)  BP (!) 130/93  Pulse Rate (!) 133  ECG Heart Rate (!) 132  Resp 18  SpO2 97 %  O2 Device Room Air  Assess: MEWS Score  MEWS Temp 0  MEWS Systolic 0  MEWS Pulse 3  MEWS RR 0  MEWS LOC 0  MEWS Score 3  MEWS Score Color Yellow  Assess: if the MEWS score is Yellow or Red  Were vital signs taken at a resting state? Yes  Focused Assessment Change from prior assessment (see assessment flowsheet)  Early Detection of Sepsis Score *See Row Information* Low  MEWS guidelines implemented *See Row Information* Yes  Treat  Pain Scale 0-10  Pain Score 0  Take Vital Signs  Increase Vital Sign Frequency  Yellow: Q 2hr X 2 then Q 4hr X 2, if remains yellow, continue Q 4hrs  Escalate  MEWS: Escalate Red: discuss with charge nurse/RN and provider, consider discussing with RRT  Notify: Charge Nurse/RN  Name of Charge Nurse/RN Notified Nona Dell  Date Charge Nurse/RN Notified 11/22/20  Time Charge Nurse/RN Notified 1823  Notify: Provider  Provider Name/Title Dr. Earnie Larsson  Date Provider Notified 11/22/20  Time Provider Notified 1800  Notification Type Face-to-face  Notification Reason Change in status  Provider response At bedside  Date of Provider Response 11/22/20  Time of Provider Response 1800  Document  Patient Outcome Stabilized after interventions  Progress note created (see row info) Yes

## 2020-11-22 NOTE — ED Notes (Signed)
Unable to obtain temperature. 

## 2020-11-22 NOTE — H&P (Addendum)
History and Physical    [name] WUJ:[id number] DOB: 1928/12/28 DOA: 11/22/2020  PCP: Leanna Battles, MD Consultants:  cardiology: Dr. Einar Gip,  Patient coming from:  Home - lives with wife.   Chief Complaint: weakness and syncope   HPI: [name] is a 87 y.o. male with medical history significant of CAD s/p stent, prior CVA, COPD, DM2, HTN, GERD, OSA, alzheimers disease,  CHF, HLD, iron deficiency anemia who presented with syncopal episodes that happened this AM. Wife tells me history.  This morning he was holding his head and had a headache. She gave him some tylenol and tried to stand him up, but he kept passing out and falling down onto cough. Her aides got a wheelchair where they got him to his bed and had another syncopal episode while sitting in bed. He was not responsive for this time. No seizure like activity. Denies any vision changes, chest pain, palpitations, shortness of breath, fever/chills, stomach pain, N/V/D. The aides did take his blood pressure and it was 70/44.  He came to ER on 7/15 for hypotension and dehydration. Was given fluid and sent home. His son is a physician and was there at end to also add on history. He was on norvasc 10mg /day and this was stopped on 7/15; however, after he was home his bp was labile and sometimes around 956-213 systolic so they started back 5mg  of amlodipine daily.     ED Course: vitals:  Pertinent labs: Wbc: 13.9, Hgb: 8.4 (7.5-9.2), Glucose: 316, Creatinine: 1.99 (1.90-2.60), Troponin: 20---> 18    Bnp: 151, CXR clear and CTH no acute findings. Ekg sinus brady rate of 50. Orthostatics done. Bp fine, but did have a 30 bpm heart rate drop going from lying to sitting. Asked to admit for syncope.    Review of Systems: As per HPI; otherwise review of systems reviewed and negative.   Ambulatory Status:  Ambulates with walker    Past Medical History:  Diagnosis Date   Anemia    Asthma    Benign prostatic hypertrophy    Bilateral  sensorineural hearing loss    CAD (coronary artery disease) 07/2003   a.  3.0 x 28 mm Taxus DES in the mid RCA in 2005   COPD (chronic obstructive pulmonary disease) (HCC)    Diverticulosis    DJD (degenerative joint disease)    DM (diabetes mellitus) (HCC)    Dyslipidemia    GERD (gastroesophageal reflux disease)    Hemorrhoids    Hiatal hernia    Hypertension    OSA (obstructive sleep apnea)    cpap use   Osteoarthritis    Overactive bladder    Rhinitis    Seasonal allergies    Shingles    Stroke [facility])    TIA (transient ischemic attack) 12/2006   Dr. Leonie Man    Past Surgical History:  Procedure Laterality Date   BACK SURGERY     lower back   Cataract surgery     1 eye done.   CORONARY ANGIOPLASTY WITH STENT PLACEMENT  07/2003   Est. EF of 65% with stent to the RCA, by Dr. Acie Fredrickson   CYSTOSCOPY WITH INSERTION OF UROLIFT N/A 02/25/2016   Procedure: CYSTOSCOPY WITH INSERTION OF UROLIFT x6;  Surgeon: Cleon Gustin, MD;  Location: [facility];  Service: Urology;  Laterality: N/A;  1 HOUR     I & D EXTREMITY Left 11/10/2019   Procedure: IRRIGATION AND DEBRIDEMENT EXTREMITY, left olecranon bursa;  Surgeon: Edmonia Lynch  D, MD;  Location: [place];  Service: Orthopedics;  Laterality: Left;   INNER EAR SURGERY     JOINT REPLACEMENT Right    L5 laminectomy  2004   Dr. Sherwood Gambler   right total knee  2006   Dr. Maureen Ralphs    Social History   Socioeconomic History   Marital status: Married    Spouse name: Not on file   Number of children: 4   Years of education: 16+   Highest education level: Not on file  Occupational History   Occupation: retired professor  Tobacco Use   Smoking status: Former    Packs/day: 1.00    Years: 43.00    Pack years: 43.00    Types: Cigarettes    Quit date: 05/02/1988    Years since quitting: 32.5   Smokeless tobacco: Never  Substance and Sexual Activity   Alcohol use: Yes    Comment: RARE-social   Drug use: No   Sexual activity: Not Currently   Other Topics Concern   Not on file  Social History Narrative   Lives w/ wife   Caffeine use:      Social Determinants of Health   Financial Resource Strain: Not on file  Food Insecurity: Not on file  Transportation Needs: Not on file  Physical Activity: Not on file  Stress: Not on file  Social Connections: Not on file  Intimate Partner Violence: Not on file    Allergies  Allergen Reactions   Ciprofloxacin Other (See Comments)    unk   Ciprofloxacin Hcl Other (See Comments)    Unknown reaction   Mirabegron Other (See Comments)    FATIGUE   Penicillins     Has patient had a PCN reaction causing immediate rash, facial/tongue/throat swelling, SOB or lightheadedness with hypotension: Unknown Has patient had a PCN reaction causing severe rash involving mucus membranes or skin necrosis: Unknown Has patient had a PCN reaction that required hospitalization Unknown Has patient had a PCN reaction occurring within the last 10 years: Unknown If all of the above answers are "NO", then may proceed with Cephalosporin use. Tolerate cephalosporins   Penicillins Other (See Comments)    unk   Sulfa Antibiotics Hives and Rash   Sulfamethoxazole Rash   Sulfonamide Derivatives Rash    Family History  Problem Relation Age of Onset   Lung disease Father    Asthma Father    Sudden death Mother        unknown causes   Lung disease Brother    Asthma Brother    Sudden death Brother 20       unknown causes   Sudden death Sister        unknown causes   Sudden death Sister        unknown causes    Prior to Admission medications   Medication Sig Start Date End Date Taking? Authorizing Provider  acetaminophen (TYLENOL) 325 MG tablet Take 2 tablets (650 mg total) by mouth every 6 (six) hours as needed for moderate pain. 11/13/19   Arrien, Jimmy Picket, MD  albuterol (PROVENTIL HFA;VENTOLIN HFA) 108 (90 Base) MCG/ACT inhaler Inhale 2 puffs into the lungs every 6 (six) hours as needed for  wheezing or shortness of breath.    [provider]  albuterol (PROVENTIL) (2.5 MG/3ML) 0.083% nebulizer solution Take 2.5 mg by nebulization 2 (two) times daily.     [provider]  amLODipine (NORVASC) 5 MG tablet Take 5 mg by mouth daily.  [provider]  amLODipine (NORVASC) 5 MG tablet Take 5 mg by mouth 2 (two) times daily. 11/07/20   [provider]  aspirin EC 81 MG tablet Take 81 mg by mouth daily with supper.    [provider]  aspirin EC 81 MG tablet Take 81 mg by mouth daily. Swallow whole.    [provider]  atorvastatin (LIPITOR) 20 MG tablet Take 20 mg by mouth daily.     [provider]  atorvastatin (LIPITOR) 20 MG tablet Take 20 mg by mouth daily. 06/05/20   [provider]  brimonidine (ALPHAGAN) 0.15 % ophthalmic solution Place 1 drop into the left eye 2 (two) times daily. 10/29/19   [provider]  brimonidine (ALPHAGAN) 0.2 % ophthalmic solution Place 1 drop into both eyes 2 (two) times daily. 09/17/20   [provider]  budesonide (PULMICORT) 0.25 MG/2ML nebulizer solution Take 2 mLs (0.25 mg total) by nebulization 2 (two) times daily. 11/13/19   Arrien, Jimmy Picket, MD  Cholecalciferol (VITAMIN D) 2000 units CAPS Take 2,000 Units by mouth daily with supper.     [provider]  Cholecalciferol (VITAMIN D) 50 MCG (2000 UT) CAPS Take 2,000 Units by mouth every evening.    [provider]  clopidogrel (PLAVIX) 75 MG tablet Take 1 tablet (75 mg total) by mouth daily. 02/29/16   McKenzie, Candee Furbish, MD  clopidogrel (PLAVIX) 75 MG tablet Take 75 mg by mouth daily. 08/22/20   [provider]  divalproex (DEPAKOTE) 125 MG DR tablet Take 125 mg by mouth at bedtime. 07/24/20   [provider]  donepezil (ARICEPT) 10 MG tablet Take 10 mg by mouth daily with supper. 04/08/17   [provider]  donepezil (ARICEPT) 10 MG tablet Take 10 mg by mouth every  evening. 11/07/20   [provider]  escitalopram (LEXAPRO) 5 MG tablet Take 5 mg by mouth daily with supper. 02/13/17   [provider]  escitalopram (LEXAPRO) 5 MG tablet Take 5 mg by mouth every evening. 11/07/20   [provider]  finasteride (PROSCAR) 5 MG tablet Take 5 mg by mouth daily with supper.  04/16/15   [provider]  finasteride (PROSCAR) 5 MG tablet Take 5 mg by mouth every evening. 08/22/20   [provider]  Fluticasone-Umeclidin-Vilant (TRELEGY ELLIPTA) 100-62.5-25 MCG/INH AEPB Inhale 1 puff into the lungs daily.    [provider]  furosemide (LASIX) 40 MG tablet Take 1 tablet (40 mg total) by mouth daily. 11/14/19 12/14/19  Arrien, Jimmy Picket, MD  furosemide (LASIX) 40 MG tablet Take 40 mg by mouth daily. 09/02/20   [provider]  HUMALOG KWIKPEN 100 UNIT/ML KwikPen Inject 0-6 Units into the skin in the morning, at noon, and at bedtime. 10/24/20   [provider]  hydrOXYzine (ATARAX/VISTARIL) 25 MG tablet Take 25 mg by mouth 2 (two) times daily as needed for itching. 07/21/20   [provider]  insulin lispro (HUMALOG KWIKPEN) 100 UNIT/ML KiwkPen Inject 2-5 Units into the skin See admin instructions. 2-5 units per sliding scale    [provider]  ipratropium-albuterol (DUONEB) 0.5-2.5 (3) MG/3ML SOLN Take 3 mLs by nebulization in the morning, at noon, and at bedtime. 11/13/19   Arrien, Jimmy Picket, MD  ipratropium-albuterol (DUONEB) 0.5-2.5 (3) MG/3ML SOLN Take 3 mLs by nebulization in the morning, at noon, and at bedtime. 08/12/20   [provider]  latanoprost (XALATAN) 0.005 % ophthalmic solution Place 1 drop into  both eyes at bedtime. 06/20/19   [provider]  latanoprost (XALATAN) 0.005 % ophthalmic solution Place 1 drop into both eyes at bedtime. 09/17/20   [provider]  loratadine (CLARITIN) 10 MG tablet Take 10 mg by mouth daily.     [provider]  loratadine (CLARITIN) 10 MG tablet Take 10 mg by mouth daily.    [provider]  memantine (NAMENDA) 10 MG tablet Take 10 mg by mouth 2 (two) times daily. 03/18/17   [provider]  memantine (NAMENDA) 10 MG tablet Take 10 mg by mouth 2 (two) times daily. 08/22/20   [provider]  mirabegron ER (MYRBETRIQ) 50 MG TB24 tablet Take 50 mg by mouth daily with supper.    [provider]  montelukast (SINGULAIR) 10 MG tablet Take 10 mg by mouth daily.     [provider]  montelukast (SINGULAIR) 10 MG tablet Take 10 mg by mouth daily. 11/07/20   [provider]  Multiple Vitamin (MULTIVITAMIN WITH MINERALS) TABS tablet Take 1 tablet by mouth daily.    [provider]  Multiple Vitamin (MULTIVITAMIN WITH MINERALS) TABS tablet Take 1 tablet by mouth daily.    [provider]  MYRBETRIQ 50 MG TB24 tablet Take 50 mg by mouth every evening. 08/31/20   [provider]  nitroGLYCERIN (NITROSTAT) 0.4 MG SL tablet Place 0.4 mg under the tongue every 5 (five) minutes as needed for chest pain.     [provider]  pantoprazole (PROTONIX) 40 MG tablet Take 40 mg by mouth 2 (two) times daily.     [provider]  pantoprazole (PROTONIX) 40 MG tablet Take 40 mg by mouth 2 (two) times daily. 11/07/20   [provider]  predniSONE (DELTASONE) 2.5 MG tablet Take 7.5 mg by mouth daily with breakfast.    [provider]  predniSONE (DELTASONE) 5 MG tablet Take 7.5 mg by mouth daily with breakfast.     [provider]  sitaGLIPtin-metformin (JANUMET) 50-1000 MG tablet Take 1 tablet by mouth 2 (two) times daily with a meal.      [provider]  TRELEGY ELLIPTA 100-62.5-25 MCG/INH AEPB Inhale 1 puff into the lungs daily. 10/27/20   [provider]  vitamin C (ASCORBIC ACID) 500 MG tablet Take 500 mg by mouth daily with supper.     [provider]    Physical  Exam: Vitals:   11/22/20 1036 11/22/20 1119 11/22/20 1212 11/22/20 1336  BP:  (!) 95/54  (!) 111/56  Pulse:  (!) 52  (!) 52  Resp:  14  16  Temp:   (!) 95.8 F (35.4 C)   TempSrc:   Rectal   SpO2: 96% 96%  99%     General:  Appears calm and comfortable and is in NAD Eyes:  PERRL, EOMI, normal lids, iris ENT:  grossly normal hearing, lips & tongue, mmm; poor dentition  Neck:  no LAD, masses or thyromegaly; no carotid bruits Cardiovascular:  RRR, quiet systolic murmur.  No LE edema.  Respiratory:   CTA bilaterally with no wheezes/rales/rhonchi.  Normal respiratory effort. Abdomen:  soft, NT, ND, NABS Back:   normal alignment, no CVAT Skin:  no rash or induration seen on limited exam Musculoskeletal:  grossly normal tone BUE/BLE, good ROM, no bony abnormality Lower extremity:  No LE edema.  Limited foot exam with no ulcerations.  2+ distal pulses. Psychiatric:  grossly normal mood and affect, speech fluent and appropriate, Not  oriented to place, knows self and his wife and son. At baseline.  Neurologic:  CN 2-12 grossly intact, moves all extremities in coordinated fashion, sensation intact. Strength intact in bilateral LE.     Radiological Exams on Admission: Independently reviewed - see discussion in A/P where applicable  DG Chest 1 View  Result Date: 11/22/2020 CLINICAL DATA:  Weakness. EXAM: CHEST  1 VIEW COMPARISON:  November 13, 2020. FINDINGS: The heart size and mediastinal contours are within normal limits. Both lungs are clear. The visualized skeletal structures are unremarkable. IMPRESSION: No active disease. Electronically Signed   By: Marijo Conception M.D.   On: 11/22/2020 12:12   CT Head Wo Contrast  Result Date: 11/22/2020 CLINICAL DATA:  Weakness.  Syncope. EXAM: CT HEAD WITHOUT CONTRAST TECHNIQUE: Contiguous axial images were obtained from the base of the skull through the vertex without intravenous contrast. COMPARISON:  11/13/2020 FINDINGS: Brain: Motion degraded study  shows no evidence for acute hemorrhage, hydrocephalus, mass lesion, or abnormal extra-axial fluid collection. Diffuse loss of parenchymal volume is consistent with atrophy. Patchy low attenuation in the deep hemispheric and periventricular white matter is nonspecific, but likely reflects chronic microvascular ischemic demyelination. Lacunar infarct again noted right thalamus and left subinsular white matter. Vascular: No hyperdense vessel or unexpected calcification. Skull: No evidence for fracture. No worrisome lytic or sclerotic lesion. Sinuses/Orbits: Focal thickening posterior right ethmoid sinus is similar to prior. Remaining visualized paranasal sinuses are clear. Postoperative changes noted right mastoid region. Visualized portions of the globes and intraorbital fat are unremarkable. Other: None. IMPRESSION: 1. Stable exam.  No acute intracranial abnormality. 2. Atrophy with chronic small vessel white matter ischemic disease. Electronically Signed   By: Misty Stanley M.D.   On: 11/22/2020 12:48    EKG: Independently reviewed.  Sinus bradycardia  with rate 50; nonspecific ST changes with no evidence of acute ischemia. Similar to previous ekg.   Labs on Admission: I have personally reviewed the available labs and imaging studies at the time of the admission.  Pertinent labs:  Wbc: 13.9 Hgb: 8.4 (7.5-9.2) Glucose: 316 Creatinine: 1.99 (1.90-2.60) Troponin: 20--->18 Bnp: 151 CXR clear and CTH no acute findings.   Assessment/Plan Principal Problem:   Syncope -place in obs for cardiac work up with echo and tele. Cardiology also consulted -? If due to orthostasis as his HR did drop 30 points. Received 1L bolus in ER and now Receiving light IVF over night.  -?arrhythmia  -also question of iatrogenic adrenal insufficiency with chronic steroid use. Talked to his son who is a physician and he thinks this was an issue a long time ago when they weaned him down off high dose steroids. TSH pending, may  need further work up if does not respond to IVF and cardiac w/u negative.  -checking depakote level  -hold Bp meds and lasix today  -f/u on cards recommendation    Active Problems: Elevated lactic acid 1.9--->3.8 -? If due to volume status -CXR wnl, urine and blood cx pending, but no clinical signs of infection besides his hypotension.  -replace fluids and follow clinically and repeat lactic acid     Type 2 diabetes mellitus with peripheral angiopathy (HCC) -checking a1c, do not see any recent ones in chart or care everywhere -SSI/accuchecks -[name], his son, thinks that he was started on lantus recently 12 units. Sugars have been high, will add on lantus 10 units nightly basal coverage.     COPD (chronic obstructive pulmonary disease) (HCC) -stable, continue home  inhaler/scheduled nebulizers.     Coronary artery disease -tele, continue statin, plavix/asa  Prior CVA Tele, statin, asa/plavix.     Dementia ([place]) At his baseline Continue home medication Fall precautions     CHF (congestive heart failure) ([place]) Not clinically in exacerbation Last echo a year ago showed normal EF and grade 2 diastolic dysfunction -repeat echo -strict I/O and daily weight -gentle ivf overnight  -hold home lasix today in setting of syncope and hypotension -cardiology was consulted.     Chronic kidney disease, stage 3a (HCC) At baseline, follow bmp    OSA (obstructive sleep apnea) Continue cpap at night.     Iron deficiency anemia Iron labs done at pcp    Mixed hyperlipidemia  Continue statin.    Called son, Dr. Berneta Sages and went over medication as med rec has not been done. Ordered all home medication. Lantus was on not his med rec, but was recently added. He thinks 12 units, ordered 10.   There is no height or weight on file to calculate BMI.    Level of care: Telemetry Cardiac DVT prophylaxis:  Lovenox  Code Status: DNR -confirmed with family Family Communication:  wife  present: Sita Homan    Son: Dr. Berneta Sages  Disposition Plan:  The patient is from: home  Anticipated d/c is to: home once cardiology work up has been done.  Consults called: cardiology, Dr. Virgina Jock   Admission status:  observation    Orma Flaming MD [facility]   How to contact the [facility] Attending or Consulting provider [place] or covering provider during after hours [place], for this patient?  Check the care team in [facility] and look for a) attending/consulting [facility] provider listed and b) the [facility] team listed Log into [email or web address] and use [place]'s universal password to access. If you do not have the password, please contact the hospital operator. Locate the [facility] provider you are looking for under [facility] and page to a number that you can be directly reached. If you still have difficulty reaching the provider, please page the [facility] (Director on Call) for the Hospitalists listed on amion for assistance.   11/22/2020, 4:12 PM

## 2020-11-22 NOTE — ED Notes (Signed)
Patient transported to CT 

## 2020-11-22 NOTE — ED Notes (Signed)
Critical Lactic acid 3.8 was told to Rogers Blocker, MD.

## 2020-11-23 ENCOUNTER — Observation Stay (HOSPITAL_COMMUNITY): Payer: Medicare PPO

## 2020-11-23 DIAGNOSIS — R55 Syncope and collapse: Secondary | ICD-10-CM

## 2020-11-23 DIAGNOSIS — I484 Atypical atrial flutter: Secondary | ICD-10-CM | POA: Diagnosis not present

## 2020-11-23 DIAGNOSIS — I2510 Atherosclerotic heart disease of native coronary artery without angina pectoris: Secondary | ICD-10-CM | POA: Diagnosis not present

## 2020-11-23 LAB — BLOOD CULTURE ID PANEL (REFLEXED) - BCID2

## 2020-11-23 LAB — CBC
HCT: 27.8 % — ABNORMAL LOW (ref 39.0–52.0)
Hemoglobin: 8.9 g/dL — ABNORMAL LOW (ref 13.0–17.0)
MCH: 25.6 pg — ABNORMAL LOW (ref 26.0–34.0)
MCHC: 32 g/dL (ref 30.0–36.0)
MCV: 80.1 fL (ref 80.0–100.0)
Platelets: 169 10*3/uL (ref 150–400)
RBC: 3.47 MIL/uL — ABNORMAL LOW (ref 4.22–5.81)
RDW: 15.5 % (ref 11.5–15.5)
WBC: 9.8 10*3/uL (ref 4.0–10.5)
nRBC: 0 % (ref 0.0–0.2)

## 2020-11-23 LAB — BASIC METABOLIC PANEL
Anion gap: 7 (ref 5–15)
BUN: 39 mg/dL — ABNORMAL HIGH (ref 8–23)
CO2: 26 mmol/L (ref 22–32)
Calcium: 8.5 mg/dL — ABNORMAL LOW (ref 8.9–10.3)
Chloride: 110 mmol/L (ref 98–111)
Creatinine, Ser: 1.81 mg/dL — ABNORMAL HIGH (ref 0.61–1.24)
GFR, Estimated: 35 mL/min — ABNORMAL LOW (ref >60)
Glucose, Bld: 245 mg/dL — ABNORMAL HIGH (ref 70–99)
Potassium: 4 mmol/L (ref 3.5–5.1)
Sodium: 143 mmol/L (ref 135–145)

## 2020-11-23 LAB — ECHOCARDIOGRAM COMPLETE
Area-P 1/2: 3.31 cm2
Calc EF: 81.5 %
MV VTI: 1.24 cm2
S' Lateral: 2.5 cm
Single Plane A2C EF: 83.8 %
Single Plane A4C EF: 79.1 %
Weight: 2580.8 oz

## 2020-11-23 LAB — GLUCOSE, CAPILLARY
Glucose-Capillary: 143 mg/dL — ABNORMAL HIGH (ref 70–99)
Glucose-Capillary: 331 mg/dL — ABNORMAL HIGH (ref 70–99)
Glucose-Capillary: 292 mg/dL — ABNORMAL HIGH (ref 70–99)
Glucose-Capillary: 304 mg/dL — ABNORMAL HIGH (ref 70–99)

## 2020-11-23 LAB — LACTIC ACID, PLASMA: Lactic Acid, Venous: 1.7 mmol/L (ref 0.5–1.9)

## 2020-11-23 MED ORDER — APIXABAN 2.5 MG PO TABS
2.5000 mg | ORAL_TABLET | Freq: Two times a day (BID) | ORAL | Status: DC
  Administered 2020-11-23 – 2020-11-24 (×3): 2.5 mg via ORAL
  Filled 2020-11-23 (×3): qty 1

## 2020-11-23 MED ORDER — IPRATROPIUM-ALBUTEROL 0.5-2.5 (3) MG/3ML IN SOLN
3.0000 mL | Freq: Two times a day (BID) | RESPIRATORY_TRACT | Status: DC
  Administered 2020-11-23 – 2020-11-24 (×2): 3 mL via RESPIRATORY_TRACT
  Filled 2020-11-23 (×2): qty 3

## 2020-11-23 MED ORDER — HYDRALAZINE HCL 20 MG/ML IJ SOLN
5.0000 mg | INTRAMUSCULAR | Status: DC | PRN
  Administered 2020-11-24: 5 mg via INTRAVENOUS
  Filled 2020-11-23: qty 1

## 2020-11-23 MED ORDER — DILTIAZEM HCL ER COATED BEADS 120 MG PO CP24
120.0000 mg | ORAL_CAPSULE | Freq: Every day | ORAL | Status: DC
  Administered 2020-11-23 – 2020-11-24 (×2): 120 mg via ORAL
  Filled 2020-11-23 (×2): qty 1

## 2020-11-23 NOTE — Plan of Care (Signed)
  Problem: Education: Goal: Knowledge of General Education information will improve Description: Including pain rating scale, medication(s)/side effects and non-pharmacologic comfort measures Outcome: Completed/Met

## 2020-11-23 NOTE — Evaluation (Signed)
Physical Therapy Evaluation Patient Details Name: [name] MRN: [id number] DOB: 08-17-1928 Today's Date: 11/23/2020   History of Present Illness  [name] is a 87 y.o. male with medical history significant of CAD s/p stent, prior CVA, COPD, DM2, HTN, GERD, OSA, alzheimers disease,  CHF, HLD, iron deficiency anemia who presented with syncopal episodes that happened this AM. Wife tells me history.  This morning he was holding his head and had a headache. She gave him some tylenol and tried to stand him up, but he kept passing out and falling down onto couch. Her aides got a wheelchair where they got him to his bed and had another syncopal episode while sitting in bed. He was not responsive for this time. No seizure like activity.   Clinical Impression  Patient received in recliner he is agreeable to PT assessment. Wants to go home. Wife and daughter in room. Patient has confusion and wife is able to clarify information. Patient requires min assist for sit to stand from recliner and min assist for ambulation using rolling walker. Slightly unsteady, requiring cues and assistance at times to maneuver walker for patient's safety. He returned to supine with min guard assist. Patient will continue to benefit from skilled PT while here to improve strength and safety with mobility.       Follow Up Recommendations Home health PT;Supervision for mobility/OOB    Equipment Recommendations  None recommended by PT    Recommendations for Other Services       Precautions / Restrictions Precautions Precautions: Fall Restrictions Weight Bearing Restrictions: No      Mobility  Bed Mobility Overal bed mobility: Needs Assistance Bed Mobility: Sit to Supine       Sit to supine: Min guard   General bed mobility comments: difficulty bringing legs up onto bed. Patient Response: Flat affect  Transfers Overall transfer level: Needs assistance Equipment used: Rolling walker (2  wheeled) Transfers: Sit to/from Stand Sit to Stand: Min guard         General transfer comment: cues for hand placement.  Ambulation/Gait Ambulation/Gait assistance: Min guard;Min assist Gait Distance (Feet): 100 Feet Assistive device: Rolling walker (2 wheeled) Gait Pattern/deviations: Step-through pattern;Decreased step length - right;Decreased step length - left;Shuffle Gait velocity: decr   General Gait Details: patient slightly unsteady. Reports he feels fine. At one point he turned the walker around and was trying to use it backward. Some confusion noted.  Stairs            Wheelchair Mobility    Modified Rankin (Stroke Patients Only)       Balance Overall balance assessment: Needs assistance Sitting-balance support: Feet supported Sitting balance-Leahy Scale: Fair     Standing balance support: Bilateral upper extremity supported;During functional activity Standing balance-Leahy Scale: Fair Standing balance comment: benefits from B UE support for safety and balance                             Pertinent Vitals/Pain Pain Assessment: No/denies pain    Home Living Family/patient expects to be discharged to:: Private residence Living Arrangements: Spouse/significant other Available Help at Discharge: Personal care attendant;Family;Available 24 hours/day Type of Home: House Home Access: Ramped entrance     Home Layout: Two level;Able to live on main level with bedroom/bathroom Home Equipment: Grab bars - toilet;Grab bars - tub/shower;Walker - 4 wheels;Cane - quad;Shower seat      Prior Function Level of Independence: Needs assistance  Comments: patient has aides that come, wife at home to assist. Has walker at home. Patient confused on eval.     Hand Dominance   Dominant Hand: Right    Extremity/Trunk Assessment   Upper Extremity Assessment Upper Extremity Assessment: Generalized weakness    Lower Extremity  Assessment Lower Extremity Assessment: Generalized weakness    Cervical / Trunk Assessment Cervical / Trunk Assessment: Normal  Communication   Communication: No difficulties  Cognition Arousal/Alertness: Awake/alert Behavior During Therapy: Flat affect                                          General Comments      Exercises     Assessment/Plan    PT Assessment Patient needs continued PT services  PT Problem List Decreased strength;Decreased mobility;Decreased activity tolerance;Decreased balance;Decreased knowledge of use of DME;Decreased knowledge of precautions;Decreased safety awareness;Decreased cognition       PT Treatment Interventions DME instruction;Therapeutic exercise;Gait training;Balance training;Functional mobility training;Therapeutic activities;Patient/family education;Cognitive remediation    PT Goals (Current goals can be found in the Care Plan section)  Acute Rehab PT Goals Patient Stated Goal: patient wants to go home PT Goal Formulation: With patient/family Time For Goal Achievement: 12/07/20 Potential to Achieve Goals: Fair    Frequency Min 3X/week   Barriers to discharge        Co-evaluation               AM-PAC PT "6 Clicks" Mobility  Outcome Measure Help needed turning from your back to your side while in a flat bed without using bedrails?: A Little Help needed moving from lying on your back to sitting on the side of a flat bed without using bedrails?: A Little Help needed moving to and from a bed to a chair (including a wheelchair)?: A Little Help needed standing up from a chair using your arms (e.g., wheelchair or bedside chair)?: A Little Help needed to walk in hospital room?: A Little Help needed climbing 3-5 steps with a railing? : A Little 6 Click Score: 18    End of Session Equipment Utilized During Treatment: Gait belt Activity Tolerance: Patient limited by fatigue Patient left: in bed;with call bell/phone  within reach;with family/visitor present Nurse Communication: Mobility status PT Visit Diagnosis: Other abnormalities of gait and mobility (R26.89);Muscle weakness (generalized) (M62.81);History of falling (Z91.81);Difficulty in walking, not elsewhere classified (R26.2);Unsteadiness on feet (R26.81)    Time: 1308-6578 PT Time Calculation (min) (ACUTE ONLY): 22 min   Charges:   PT Evaluation $PT Eval Moderate Complexity: 1 Mod PT Treatments $Gait Training: 8-22 mins        Eshika Reckart, PT, GCS 11/23/20,2:10 PM

## 2020-11-23 NOTE — Care Management Obs Status (Signed)
[facility] NOTIFICATION   Patient Details  Name: BRALEN WILTGEN MRN: [id number] Date of Birth: 1928-06-12   Medicare Observation Status Notification Given:  Yes    Bethena Roys, RN 11/23/2020, 5:18 PM

## 2020-11-23 NOTE — Progress Notes (Signed)
Pt placed on CPAP for night time rest.  Tolerating well at this time.

## 2020-11-23 NOTE — Discharge Instructions (Signed)

## 2020-11-23 NOTE — Progress Notes (Signed)
  Echocardiogram 2D Echocardiogram has been performed.  [name] 11/23/2020, 11:33 AM

## 2020-11-23 NOTE — Progress Notes (Signed)
RT Note:  Placed patient on CPAP for HS, via FFM auto titrate. Tolerating well at this time.

## 2020-11-23 NOTE — Progress Notes (Signed)
   2D echo was attempted, but patient was eating. Will try later [place] 11/23/2020, 9:05 AM

## 2020-11-23 NOTE — Progress Notes (Signed)
PHARMACY - PHYSICIAN COMMUNICATION CRITICAL VALUE ALERT - BLOOD CULTURE IDENTIFICATION (BCID)  [name] is an 87 y.o. male who presented to [facility] on 11/22/2020 with a chief complaint of syncope  Assessment:  afebrile, WBC WNL CXR WNL. gpc cluster staph epi 1/3 bottles likely a contaminate.   Name of physician (or Provider) Contacted: Dr. Antonieta Pert  Current antibiotics: none  Changes to prescribed antibiotics recommended:  Likely contaminate, no antibiotic initiated at this time  Results for orders placed or performed during the hospital encounter of 11/22/20  Blood Culture ID Panel (Reflexed) (Collected: 11/22/2020  6:36 PM)  Result Value Ref Range   Enterococcus faecalis NOT DETECTED NOT DETECTED   Enterococcus Faecium NOT DETECTED NOT DETECTED   Listeria monocytogenes NOT DETECTED NOT DETECTED   Staphylococcus species DETECTED (A) NOT DETECTED   Staphylococcus aureus (BCID) NOT DETECTED NOT DETECTED   Staphylococcus epidermidis DETECTED (A) NOT DETECTED   Staphylococcus lugdunensis NOT DETECTED NOT DETECTED   Streptococcus species NOT DETECTED NOT DETECTED   Streptococcus agalactiae NOT DETECTED NOT DETECTED   Streptococcus pneumoniae NOT DETECTED NOT DETECTED   Streptococcus pyogenes NOT DETECTED NOT DETECTED   A.calcoaceticus-baumannii NOT DETECTED NOT DETECTED   Bacteroides fragilis NOT DETECTED NOT DETECTED   Enterobacterales NOT DETECTED NOT DETECTED   Enterobacter cloacae complex NOT DETECTED NOT DETECTED   Escherichia coli NOT DETECTED NOT DETECTED   Klebsiella aerogenes NOT DETECTED NOT DETECTED   Klebsiella oxytoca NOT DETECTED NOT DETECTED   Klebsiella pneumoniae NOT DETECTED NOT DETECTED   Proteus species NOT DETECTED NOT DETECTED   Salmonella species NOT DETECTED NOT DETECTED   Serratia marcescens NOT DETECTED NOT DETECTED   Haemophilus influenzae NOT DETECTED NOT DETECTED   Neisseria meningitidis NOT DETECTED NOT DETECTED   Pseudomonas aeruginosa NOT  DETECTED NOT DETECTED   Stenotrophomonas maltophilia NOT DETECTED NOT DETECTED   Candida albicans NOT DETECTED NOT DETECTED   Candida auris NOT DETECTED NOT DETECTED   Candida glabrata NOT DETECTED NOT DETECTED   Candida krusei NOT DETECTED NOT DETECTED   Candida parapsilosis NOT DETECTED NOT DETECTED   Candida tropicalis NOT DETECTED NOT DETECTED   Cryptococcus neoformans/gattii NOT DETECTED NOT DETECTED   Methicillin resistance mecA/C NOT DETECTED NOT DETECTED    Levonne Spiller 11/23/2020  5:45 PM

## 2020-11-23 NOTE — Progress Notes (Signed)
PROGRESS NOTE    [name]  IRJ:[id number] DOB: 12-23-1928 DOA: 11/22/2020 PCP: Leanna Battles, MD   Chief Complaint  Patient presents with   Weakness   Syncope   Brief Narrative: 87 year old male with moderate dementia, CAD, history of HTN, history of stroke, COPD, T2DM, HTN, GERD, OSA, CHF, hyperlipidemia, iron deficiency anemia presented with syncopal episode. As per HPI/chart review  7/24 am - he was holding his head and had a headache. She gave him some tylenol and tried to stand him up, but he kept passing out and falling down onto cough. Her aides got a wheelchair where they got him to his bed and had another syncopal episode while sitting in bed. He was not responsive for this time. No seizure like activity. Denies any vision changes, chest pain, palpitations, shortness of breath, fever/chills, stomach pain, N/V/D. The aides did take his blood pressure and it was 70/44.  He came to ER on 7/15 for hypotension and dehydration. Was given fluid and sent home. His son is a physician and was there at end to also add on history. He was on norvasc 10mg /day and this was stopped on 7/15; however, after he was home his bp was labile and sometimes around 301-601 systolic so they started back 5mg  of amlodipine daily.  ED Course: Pertinent labs: Wbc: 13.9, Hgb: 8.4 (7.5-9.2), Glucose: 316, Creatinine: 1.99 (1.90-2.60), Troponin: 20---> 18    Bnp: 151, CXR clear and CTH no acute findings. Ekg sinus brady rate of 50. Orthostatics done. Bp fine, but did have a 30 bpm heart rate drop going from lying to sitting. Asked to admit for syncope.   Subjective: Seen thsi am Alert,awake Wife at beside Cardio at bedside  Assessment & Plan:  Syncope: Broad differential including dehydration, orthostatic hypotension tachybradycardia syndrome postconversion syncope.  Discussed with cardiology outpatient input on board, patient had a flutter on 7/24 converted to sinus rhythm with IV Cardizem.  Continue  monitoring telemetry, echo showed EF 65 to 70% 7/8.  PT OT evaluation, monitor vitals.  Monitor on telemetry.  New onset paroxysmal a flutter with RVR converted to sinus with Cardizem.  Changing to p.o. Cardizem from drip and also starting Eliquis given CHA2DS2-VASc score of at least 6 with annual risk of stroke 9% per cardiology and stopping aspirin and Plavix  Mildly elevated troponin type II MI demand ischemia due to tachycardia.  No chest pain.  Lactic acidosis: Likely due to hypotension.  Resolved on recheck. Mild leukocytosis has resolved question reactive, unclear if infection was present, follow-up culture.  So far afebrile.  Type 2 diabetes mellitus with peripheral angiopathy.  HbA1c poorly controlled 9.4.  Blood sugar uncontrolled/labile.  Continue Lantus 10 units nightly, sliding scale insulin monitor and adjust.  Son thinks patient was recently placed on 12 units Recent Labs  Lab 11/22/20 1642 11/22/20 1735 11/22/20 2217 11/23/20 0726 11/23/20 1145  GLUCAP 399* 482* 343* 143* 331*   Moderate dementia fall precaution PT eval supportive care.  At baseline.  CKD stage IIIa, stable Recent Labs  Lab 11/22/20 1104 11/23/20 0326  BUN 40* 39*  CREATININE 1.99* 1.81*     Mixed hyperlipidemia on statin  Iron deficient anemia hemoglobin stable follow-up with PCP  Chronic CHF with preserved EF G2 DD stable currently  OSA CPAP nightly  Diet Order             Diet heart healthy/carb modified Room service appropriate? Yes; Fluid consistency: Thin  Diet effective now  Patient's Body mass index is 23.14 kg/m. DVT prophylaxis: enoxaparin (LOVENOX) injection 30 mg Start: 11/22/20 1600 Code Status:   Code Status: DNR  Family Communication: plan of care discussed with patient's wife, daughter  at bedside.  Status is: Admitted as observation Patient remains hospitalized for further evaluation of syncope to monitor in telemetry given concern for pause and  to transition to oral Cardizem from IV Cardizem drip and ruling out infection Dispo: The patient is from: Home              Anticipated d/c is to: Home              Patient currently is not medically stable to d/c.   Difficult to place patient No       Unresulted Labs (From admission, onward)     Start     Ordered   11/22/20 1614  Culture, blood (routine x 2)  BLOOD CULTURE X 2,   R (with STAT occurrences)      11/22/20 1613   11/22/20 1512  Hemoglobin A1c  Once,   STAT       Comments: To assess prior glycemic control    11/22/20 1513   Unscheduled  Lactic acid, plasma  ONCE - STAT,   R        11/23/20 0839           Medications reviewed:  Scheduled Meds:  aspirin EC  81 mg Oral Q supper   atorvastatin  20 mg Oral Daily   brimonidine  1 drop Left Eye BID   cholecalciferol  2,000 Units Oral Q supper   clopidogrel  75 mg Oral Daily   divalproex  125 mg Oral QHS   donepezil  10 mg Oral Q supper   enoxaparin (LOVENOX) injection  30 mg Subcutaneous Q24H   escitalopram  5 mg Oral Q supper   finasteride  5 mg Oral QPM   fluticasone furoate-vilanterol  1 puff Inhalation Daily   And   umeclidinium bromide  1 puff Inhalation Daily   insulin aspart  0-9 Units Subcutaneous TID WC   insulin glargine  10 Units Subcutaneous QHS   ipratropium-albuterol  3 mL Nebulization TID   latanoprost  1 drop Both Eyes QHS   loratadine  10 mg Oral Daily   memantine  10 mg Oral BID   mirabegron ER  50 mg Oral QPM   montelukast  10 mg Oral Daily   multivitamin with minerals  1 tablet Oral Daily   pantoprazole  40 mg Oral BID   predniSONE  7.5 mg Oral Q breakfast   Continuous Infusions:  diltiazem (CARDIZEM) infusion 5 mg/hr (11/23/20 0046)   Consultants:see note  Procedures:see note Antimicrobials: Anti-infectives (From admission, onward)    None      Culture/Microbiology    Component Value Date/Time   SDES URINE, RANDOM 11/12/2019 0429   SPECREQUEST NONE 11/12/2019 0429    CULT  11/12/2019 0429    NO GROWTH Performed at [facility], [place] [address], [place], [place] [address]    REPTSTATUS 11/13/2019 FINAL 11/12/2019 0429    Other culture-see note  Objective: Vitals: Today's Vitals   11/22/20 2115 11/23/20 0014 11/23/20 0504 11/23/20 0540  BP:  (!) 152/54 (!) 160/57   Pulse:  63  68  Resp:      Temp:  (!) 97.5 F (36.4 C) (!) 97.5 F (36.4 C)   TempSrc:   Oral   SpO2: 99% 95%  100%  Weight:    73.2 kg  PainSc:  0-No pain      Intake/Output Summary (Last 24 hours) at 11/23/2020 0839 Last data filed at 11/23/2020 0606 Gross per 24 hour  Intake 635.77 ml  Output --  Net 635.77 ml   Filed Weights   11/23/20 0540  Weight: 73.2 kg   Weight change:   Intake/Output from previous day: 07/24 0701 - 07/25 0700 In: 635.8 [I.V.:635.8] Out: -  Intake/Output this shift: No intake/output data recorded. Filed Weights   11/23/20 0540  Weight: 73.2 kg   Examination: General exam: AAO to people in place, not in acute distress older than stated age, weak appearing. HEENT:Oral mucosa moist, Ear/Nose WNL grossly,dentition normal. Respiratory system: bilaterally diminished, no use of accessory muscle, non tender. Cardiovascular system: S1 & S2 +,No JVD. Gastrointestinal system: Abdomen soft, NT,ND, BS+. Nervous System:Alert, awake, moving extremities Extremities: no edema, distal peripheral pulses palpable.  Skin: No rashes,no icterus. MSK: Normal muscle bulk,tone, power Data Reviewed: I have personally reviewed following labs and imaging studies CBC: Recent Labs  Lab 11/22/20 1104 11/23/20 0326  WBC 13.9* 9.8  NEUTROABS 10.5*  --   HGB 8.4* 8.9*  HCT 26.0* 27.8*  MCV 81.3 80.1  PLT 160 992   Basic Metabolic Panel: Recent Labs  Lab 11/22/20 1104 11/23/20 0326  NA 136 143  K 4.1 4.0  CL 105 110  CO2 24 26  GLUCOSE 316* 245*  BUN 40* 39*  CREATININE 1.99* 1.81*  CALCIUM 8.0* 8.5*   GFR: Estimated Creatinine Clearance: 28.6  mL/min (A) (by C-G formula based on SCr of 1.81 mg/dL (H)). Liver Function Tests: Recent Labs  Lab 11/22/20 1104  AST 13*  ALT 15  ALKPHOS 69  BILITOT 0.4  PROT 4.8*  ALBUMIN 2.3*   No results for input(s): LIPASE, AMYLASE in the last 168 hours. No results for input(s): AMMONIA in the last 168 hours. Coagulation Profile: No results for input(s): INR, PROTIME in the last 168 hours. Cardiac Enzymes: No results for input(s): CKTOTAL, CKMB, CKMBINDEX, TROPONINI in the last 168 hours. BNP (last 3 results) No results for input(s): PROBNP in the last 8760 hours. HbA1C: No results for input(s): HGBA1C in the last 72 hours. CBG: Recent Labs  Lab 11/22/20 1642 11/22/20 1735 11/22/20 2217 11/23/20 0726  GLUCAP 399* 482* 343* 143*   Lipid Profile: No results for input(s): CHOL, HDL, LDLCALC, TRIG, CHOLHDL, LDLDIRECT in the last 72 hours. Thyroid Function Tests: Recent Labs    11/22/20 1136 11/22/20 1550  TSH  --  2.839  FREET4 0.92  --    Anemia Panel: No results for input(s): VITAMINB12, FOLATE, FERRITIN, TIBC, IRON, RETICCTPCT in the last 72 hours. Sepsis Labs: Recent Labs  Lab 11/22/20 1135 11/22/20 1415 11/22/20 1837 11/22/20 2136  LATICACIDVEN 1.9 3.8* 3.1* 2.9*    Recent Results (from the past 240 hour(s))  Resp Panel by RT-PCR (Flu A&B, Covid) Nasopharyngeal Swab     Status: None   Collection Time: 11/22/20 11:48 AM   Specimen: Nasopharyngeal Swab; Nasopharyngeal(NP) swabs in vial transport medium  Result Value Ref Range Status   SARS Coronavirus 2 by RT PCR NEGATIVE NEGATIVE Final    Comment: (NOTE) SARS-CoV-2 target nucleic acids are NOT DETECTED.  The SARS-CoV-2 RNA is generally detectable in upper respiratory specimens during the acute phase of infection. The lowest concentration of SARS-CoV-2 viral copies this assay can detect is 138 copies/mL. A negative result does not preclude SARS-Cov-2 infection and should not be used  as the sole basis for  treatment or other patient management decisions. A negative result may occur with  improper specimen collection/handling, submission of specimen other than nasopharyngeal swab, presence of viral mutation(s) within the areas targeted by this assay, and inadequate number of viral copies(<138 copies/mL). A negative result must be combined with clinical observations, patient history, and epidemiological information. The expected result is Negative.  Fact Sheet for Patients:  EntrepreneurPulse.com.au  Fact Sheet for Healthcare Providers:  IncredibleEmployment.be  This test is no t yet approved or cleared by the [place] FDA and  has been authorized for detection and/or diagnosis of SARS-CoV-2 by FDA under an Emergency Use Authorization (EUA). This EUA will remain  in effect (meaning this test can be used) for the duration of the COVID-19 declaration under Section 564(b)(1) of the Act, 21 U.S.C.section 360bbb-3(b)(1), unless the authorization is terminated  or revoked sooner.       Influenza A by PCR NEGATIVE NEGATIVE Final   Influenza B by PCR NEGATIVE NEGATIVE Final    Comment: (NOTE) The Xpert Xpress SARS-CoV-2/FLU/RSV plus assay is intended as an aid in the diagnosis of influenza from Nasopharyngeal swab specimens and should not be used as a sole basis for treatment. Nasal washings and aspirates are unacceptable for Xpert Xpress SARS-CoV-2/FLU/RSV testing.  Fact Sheet for Patients: EntrepreneurPulse.com.au  Fact Sheet for Healthcare Providers: IncredibleEmployment.be  This test is not yet approved or cleared by the [place] FDA and has been authorized for detection and/or diagnosis of SARS-CoV-2 by FDA under an Emergency Use Authorization (EUA). This EUA will remain in effect (meaning this test can be used) for the duration of the COVID-19 declaration under Section 564(b)(1) of the Act, 21  U.S.C. section 360bbb-3(b)(1), unless the authorization is terminated or revoked.  Performed at [facility], [place] [address], [place], [place] [address]      Radiology Studies: DG Chest 1 View  Result Date: 11/22/2020 CLINICAL DATA:  Weakness. EXAM: CHEST  1 VIEW COMPARISON:  November 13, 2020. FINDINGS: The heart size and mediastinal contours are within normal limits. Both lungs are clear. The visualized skeletal structures are unremarkable. IMPRESSION: No active disease. Electronically Signed   By: Marijo Conception M.D.   On: 11/22/2020 12:12   CT Head Wo Contrast  Result Date: 11/22/2020 CLINICAL DATA:  Weakness.  Syncope. EXAM: CT HEAD WITHOUT CONTRAST TECHNIQUE: Contiguous axial images were obtained from the base of the skull through the vertex without intravenous contrast. COMPARISON:  11/13/2020 FINDINGS: Brain: Motion degraded study shows no evidence for acute hemorrhage, hydrocephalus, mass lesion, or abnormal extra-axial fluid collection. Diffuse loss of parenchymal volume is consistent with atrophy. Patchy low attenuation in the deep hemispheric and periventricular white matter is nonspecific, but likely reflects chronic microvascular ischemic demyelination. Lacunar infarct again noted right thalamus and left subinsular white matter. Vascular: No hyperdense vessel or unexpected calcification. Skull: No evidence for fracture. No worrisome lytic or sclerotic lesion. Sinuses/Orbits: Focal thickening posterior right ethmoid sinus is similar to prior. Remaining visualized paranasal sinuses are clear. Postoperative changes noted right mastoid region. Visualized portions of the globes and intraorbital fat are unremarkable. Other: None. IMPRESSION: 1. Stable exam.  No acute intracranial abnormality. 2. Atrophy with chronic small vessel white matter ischemic disease. Electronically Signed   By: Misty Stanley M.D.   On: 11/22/2020 12:48     LOS: 0 days   Antonieta Pert, MD [facility]  11/23/2020, 8:39 AM

## 2020-11-23 NOTE — Progress Notes (Addendum)
Subjective:  Feels well No complaints Wants to go home  Objective:  Vital Signs in the last 24 hours: Temp:  [95.8 F (35.4 C)-97.5 F (36.4 C)] 97.5 F (36.4 C) (07/25 0504) Pulse Rate:  [52-133] 68 (07/25 0540) Resp:  [14-21] 18 (07/24 2004) BP: (95-160)/(54-93) 160/57 (07/25 0504) SpO2:  [95 %-100 %] 100 % (07/25 0540) Weight:  [73.2 kg] 73.2 kg (07/25 0540)  Intake/Output from previous day: 07/24 0701 - 07/25 0700 In: 635.8 [I.V.:635.8] Out: -   Physical Exam Vitals and nursing note reviewed.  Constitutional:      General: He is not in acute distress.    Appearance: He is well-developed.  HENT:     Head: Normocephalic and atraumatic.  Eyes:     Conjunctiva/sclera: Conjunctivae normal.     Pupils: Pupils are equal, round, and reactive to light.  Neck:     Vascular: No JVD.  Cardiovascular:     Rate and Rhythm: Normal rate and regular rhythm.     Pulses: Normal pulses and intact distal pulses.     Heart sounds: No murmur heard. Pulmonary:     Effort: Pulmonary effort is normal.     Breath sounds: Normal breath sounds. No wheezing or rales.  Abdominal:     General: Bowel sounds are normal.     Palpations: Abdomen is soft.     Tenderness: There is no rebound.  Musculoskeletal:        General: No tenderness. Normal range of motion.     Right lower leg: No edema.     Left lower leg: No edema.  Lymphadenopathy:     Cervical: No cervical adenopathy.  Skin:    General: Skin is warm and dry.  Neurological:     Mental Status: He is alert and oriented to person, place, and time.     Cranial Nerves: No cranial nerve deficit.     Lab Results: BMP Recent Labs    11/13/20 1145 11/13/20 1156 11/22/20 1104 11/23/20 0326  NA 137 141 136 143  K 3.7 3.7 4.1 4.0  CL 101 102 105 110  CO2 28  --  24 26  GLUCOSE 164* 162* 316* 245*  BUN 42* 39* 40* 39*  CREATININE 1.90* 1.90* 1.99* 1.81*  CALCIUM 8.2*  --  8.0* 8.5*  GFRNONAA 33*  --  32* 35*    CBC Recent  Labs  Lab 11/22/20 1104 11/23/20 0326  WBC 13.9* 9.8  RBC 3.20* 3.47*  HGB 8.4* 8.9*  HCT 26.0* 27.8*  PLT 160 169  MCV 81.3 80.1  MCH 26.3 25.6*  MCHC 32.3 32.0  RDW 15.3 15.5  LYMPHSABS 1.3  --   MONOABS 1.1*  --   EOSABS 0.7*  --   BASOSABS 0.1  --     HEMOGLOBIN A1C Lab Results  Component Value Date   HGBA1C 9.4 (H) 11/07/2019   MPG 223.08 11/07/2019    Cardiac Panel (last 3 results) No results for input(s): CKTOTAL, CKMB, TROPONINI, RELINDX in the last 8760 hours.  BNP (last 3 results) Recent Labs    11/22/20 1141  BNP 151.3*    TSH Recent Labs    11/13/20 1145 11/22/20 1550  TSH 5.912* 2.839    Lipid Panel     Component Value Date/Time   CHOL 109 01/03/2012 1305   TRIG 44.0 01/03/2012 1305   HDL 60.20 01/03/2012 1305   CHOLHDL 2 01/03/2012 1305   VLDL 8.8 01/03/2012 1305   LDLCALC 40 01/03/2012 1305  Hepatic Function Panel Recent Labs    11/13/20 1145 11/22/20 1104  PROT 4.9* 4.8*  ALBUMIN 2.5* 2.3*  AST 13* 13*  ALT 16 15  ALKPHOS 71 40  BILITOT 0.5 0.4  BILIDIR  --  <0.1  IBILI  --  NOT CALCULATED     Cardiac Studies:  EKG 11/22/2020: Sinus bradycardia 50 bpm with occasional PAC   Telemetry 11/22/2020: Narrow complex, regular tachcyardia Brief slowing down with IV metoprolol 2.5 mg IV Possible atypical atrial flutter       Echocardiogram 11/07/2019:  1. Left ventricular ejection fraction, by estimation, is 65 to 70%. Left  ventricular ejection fraction by PLAX is 64 %. The left ventricle has  normal function. The left ventricle has no regional wall motion  abnormalities. There is moderate left  ventricular hypertrophy. Left ventricular diastolic parameters are  consistent with Grade II diastolic dysfunction (pseudonormalization).  Elevated left ventricular end-diastolic pressure.   2. Right ventricular systolic function is normal. The right ventricular  size is normal.   3. The mitral valve is degenerative. No  evidence of mitral valve  regurgitation.   4. The aortic valve is tricuspid. Aortic valve regurgitation is not  visualized.    Assessment & Recommendations:  87 y/o [place] male with hypertension, type 2 DM, CAD, dementia, admitted with syncope, paroxysmal atrial flutter   Syncope: Differentials include dehydration, orthostatic hypotension, tachybrady syndrome, post conversion syncope. Atrial flutter with RVR on 11/22/2020, converted to sinus rhythm around 8 PM on IV diltiazem. I have not seen any significant bradyarrhtymias. No indication for pacemaker at this time.  Paroxysmal atrial flutter: Currently in sinus rhythm in 60s on diltiazem 5 mg IV Convert to PO diltiazem 120 mg daily. Stopped amlodipine. CHA2DS2VASc score 6, annual stroke risk 9%. Recommend eliquis 2.5 mg bid Stopped Aspirin and plavix   Troponin elevation: Type 2 MI in the setting of syncope, now tachycardia. Not ACS  Lactic acid elevation: Likely due to hypotension episode on 7/24. Now improving. No infectious casue suspected.    While BNP is mildly elevated, he does not appear to be in clinical heart failure.   I spoke with his [name]. Updated him about the plan.    Nigel Mormon, MD Pager: [phone] Office: [phone]

## 2020-11-24 DIAGNOSIS — I484 Atypical atrial flutter: Secondary | ICD-10-CM | POA: Diagnosis not present

## 2020-11-24 DIAGNOSIS — R55 Syncope and collapse: Secondary | ICD-10-CM | POA: Diagnosis not present

## 2020-11-24 LAB — BASIC METABOLIC PANEL
Anion gap: 5 (ref 5–15)
BUN: 28 mg/dL — ABNORMAL HIGH (ref 8–23)
CO2: 29 mmol/L (ref 22–32)
Calcium: 8.7 mg/dL — ABNORMAL LOW (ref 8.9–10.3)
Chloride: 107 mmol/L (ref 98–111)
Creatinine, Ser: 1.43 mg/dL — ABNORMAL HIGH (ref 0.61–1.24)
GFR, Estimated: 47 mL/min — ABNORMAL LOW (ref >60)
Glucose, Bld: 152 mg/dL — ABNORMAL HIGH (ref 70–99)
Potassium: 3.7 mmol/L (ref 3.5–5.1)
Sodium: 141 mmol/L (ref 135–145)

## 2020-11-24 LAB — GLUCOSE, CAPILLARY
Glucose-Capillary: 155 mg/dL — ABNORMAL HIGH (ref 70–99)
Glucose-Capillary: 192 mg/dL — ABNORMAL HIGH (ref 70–99)

## 2020-11-24 LAB — MAGNESIUM: Magnesium: 1.8 mg/dL (ref 1.7–2.4)

## 2020-11-24 LAB — HEMOGLOBIN A1C
Hgb A1c MFr Bld: 10.4 % — ABNORMAL HIGH (ref 4.8–5.6)
Mean Plasma Glucose: 252 mg/dL

## 2020-11-24 LAB — PHOSPHORUS: Phosphorus: 2.5 mg/dL (ref 2.5–4.6)

## 2020-11-24 MED ORDER — FUROSEMIDE 40 MG PO TABS: 40.0000 mg | ORAL_TABLET | Freq: Every day | ORAL | Status: AC | PRN

## 2020-11-24 MED ORDER — HYDRALAZINE HCL 50 MG PO TABS: 50.0000 mg | ORAL_TABLET | Freq: Three times a day (TID) | ORAL | Status: DC | PRN

## 2020-11-24 MED ORDER — HYDRALAZINE HCL 50 MG PO TABS
50.0000 mg | ORAL_TABLET | Freq: Three times a day (TID) | ORAL | Status: DC
  Filled 2020-11-24: qty 1

## 2020-11-24 MED ORDER — DILTIAZEM HCL ER COATED BEADS 120 MG PO CP24: 120.0000 mg | ORAL_CAPSULE | Freq: Every day | ORAL | 0 refills | Status: AC

## 2020-11-24 MED ORDER — APIXABAN 2.5 MG PO TABS: 2.5000 mg | ORAL_TABLET | Freq: Two times a day (BID) | ORAL | 0 refills | Status: AC

## 2020-11-24 MED ORDER — HYDRALAZINE HCL 50 MG PO TABS: 50.0000 mg | ORAL_TABLET | Freq: Three times a day (TID) | ORAL | 0 refills | Status: DC | PRN

## 2020-11-24 NOTE — Discharge Summary (Signed)
Physician Discharge Summary  [name] HQI:[id number] DOB: 1929/03/31 DOA: 11/22/2020  PCP: Leanna Battles, MD  Admit date: 11/22/2020 Discharge date: 11/24/2020  Admitted From: home Disposition:  home  Recommendations for Outpatient Follow-up:  Follow up with PCP in 1-2 weeks Please obtain BMP/CBC in one week  Home Health:no  Equipment/Devices: none  Discharge Condition: Stable Code Status:   Code Status: DNR Diet recommendation:  Diet Order             Diet Carb Modified           Diet heart healthy/carb modified Room service appropriate? Yes; Fluid consistency: Thin  Diet effective now                    Brief/Interim Summary: 87 year old male with moderate dementia, CAD, history of HTN, history of stroke, COPD, T2DM, HTN, GERD, OSA, CHF, hyperlipidemia, iron deficiency anemia presented with syncopal episode. As per HPI/chart review  7/24 am - he was holding his head and had a headache. She gave him some tylenol and tried to stand him up, but he kept passing out and falling down onto cough. Her aides got a wheelchair where they got him to his bed and had another syncopal episode while sitting in bed. He was not responsive for this time. No seizure like activity. Denies any vision changes, chest pain, palpitations, shortness of breath, fever/chills, stomach pain, N/V/D. The aides did take his blood pressure and it was 70/44.  He came to ER on 7/15 for hypotension and dehydration. Was given fluid and sent home. His son is a physician and was there at end to also add on history. He was on norvasc 10mg /day and this was stopped on 7/15; however, after he was home his bp was labile and sometimes around 132-440 systolic so they started back 5mg  of amlodipine daily.  ED Course: Pertinent labs: Wbc: 13.9, Hgb: 8.4 (7.5-9.2), Glucose: 316, Creatinine: 1.99 (1.90-2.60), Troponin: 20---> 18    Bnp: 151, CXR clear and CTH no acute findings. Ekg sinus brady rate of 50. Orthostatics done.  Bp fine, but did have a 30 bpm heart rate drop going from lying to sitting. Asked to admit for syncope.  Patient had new onset a flutter with heart rate converted to sinus with Cardizem, seen by cardiology placed on Eliquis low-dose and stopped aspirin and Plavix.  Cardizem drip was transitioned to oral Cardizem. Monitor additional night no signs of infection leukocytosis and lactic acidosis resolved.  Blood pressure stable on higher side placed on as needed hydralazine taken off amlodipine.  Seen by cardiac this morning okay for discharge no need for further cardiac work-up we will follow-up with cardiology as outpatient.  Discharge Diagnoses:  Syncope: Broad differential including dehydration, orthostatic hypotension tachybradycardia syndrome postconversion syncope.  Suspect he had episode of dehydration contributing to his hypotension/syncope. At this time hemodynamically stable, he is in sinus rhythm currently. Discussed with cardiology - patient had a flutter on 7/24 converted to sinus rhythm with IV Cardizem.  echo showed EF 65 to 70% 7/8.  Not tolerating oral Cardizem, did well with PT OT okay for discharge per cardiology this morning.    New onset paroxysmal a flutter with RVR converted to sinus with Cardizem.  Changing to p.o. Cardizem from drip and also starting Eliquis given CHA2DS2-VASc score of at least 6 with annual risk of stroke 9% per cardiology and stopping aspirin and Plavix.  Okay for discharge   Mildly elevated troponin type II MI  demand ischemia due to tachycardia.  No chest pain.   Lactic acidosis: Likely due to hypotension.  Resolved on recheck. Mild leukocytosis has resolved question reactive, unclear if infection was present, follow-up culture.  So far afebrile.   Type 2 diabetes mellitus with peripheral angiopathy.  HbA1c poorly controlled 9.4.  Blood sugar uncontrolled/labile.Son thinks patient was recently placed on 12 units-resume at home Recent Labs  Lab 11/23/20 0726  11/23/20 1145 11/23/20 1632 11/23/20 2110 11/24/20 0746  GLUCAP 143* 331* 292* 304* 155*    Moderate dementia fall precaution PT eval supportive care.  At baseline.   CKD stage IIIa, stable and in fact improved to 1.43.  Outpatient follow-up Recent Labs  Lab 11/22/20 1104 11/23/20 0326 11/24/20 0652  BUN 40* 39* 28*  CREATININE 1.99* 1.81* 1.43*   Mixed hyperlipidemia on statin Iron deficient anemia hemoglobin stable follow-up with PCP Chronic CHF with preserved EF G2 DD stable currently OSA CPAP nightly  Planning for home health PT OT however he has 24x7 caregiver at home.  Consults: Cardiology  Subjective: Alert awake with baseline dementia, private sitter at the bedside.    Discharge Exam: Vitals:   11/24/20 0735 11/24/20 0932  BP: (!) 158/51 (!) 143/62  Pulse: 64   Resp: 18   Temp: 97.6 F (36.4 C)   SpO2: 97%    General: Pt is alert, awake, not in acute distress Cardiovascular: RRR, S1/S2 +, no rubs, no gallops Respiratory: CTA bilaterally, no wheezing, no rhonchi Abdominal: Soft, NT, ND, bowel sounds + Extremities: no edema, no cyanosis  Discharge Instructions  Discharge Instructions     (HEART FAILURE PATIENTS) Call MD:  Anytime you have any of the following symptoms: 1) 3 pound weight gain in 24 hours or 5 pounds in 1 week 2) shortness of breath, with or without a dry hacking cough 3) swelling in the hands, feet or stomach 4) if you have to sleep on extra pillows at night in order to breathe.   Complete by: As directed    Diet Carb Modified   Complete by: As directed    Discharge instructions   Complete by: As directed    Please call call MD or return to ER for similar or worsening recurring problem that brought you to hospital or if any fever,nausea/vomiting,abdominal pain, uncontrolled pain, chest pain,  shortness of breath or any other alarming symptoms.  Please follow-up your doctor as instructed in a week time and call the office for  appointment.  Please avoid alcohol, smoking, or any other illicit substance and maintain healthy habits including taking your regular medications as prescribed.  You were cared for by a hospitalist during your hospital stay. If you have any questions about your discharge medications or the care you received while you were in the hospital after you are discharged, you can call the unit and ask to speak with the hospitalist on call if the hospitalist that took care of you is not available.  Once you are discharged, your primary care physician will handle any further medical issues. Please note that NO REFILLS for any discharge medications will be authorized once you are discharged, as it is imperative that you return to your primary care physician (or establish a relationship with a primary care physician if you do not have one) for your aftercare needs so that they can reassess your need for medications and monitor your lab values   Increase activity slowly   Complete by: As directed  Allergies as of 11/24/2020       Reactions   Ciprofloxacin Other (See Comments)   unk   Ciprofloxacin Hcl Other (See Comments)   Unknown reaction   Penicillins    Has patient had a PCN reaction causing immediate rash, facial/tongue/throat swelling, SOB or lightheadedness with hypotension: Unknown Has patient had a PCN reaction causing severe rash involving mucus membranes or skin necrosis: Unknown Has patient had a PCN reaction that required hospitalization Unknown Has patient had a PCN reaction occurring within the last 10 years: Unknown If all of the above answers are "NO", then may proceed with Cephalosporin use. Tolerate cephalosporins   Penicillins Other (See Comments)   unk   Sulfa Antibiotics Hives, Rash   Sulfamethoxazole Rash   Sulfonamide Derivatives Rash        Medication List     STOP taking these medications    amLODipine 5 MG tablet Commonly known as: NORVASC   aspirin EC 81  MG tablet       TAKE these medications    acetaminophen 325 MG tablet Commonly known as: TYLENOL Take 2 tablets (650 mg total) by mouth every 6 (six) hours as needed for moderate pain.   albuterol (2.5 MG/3ML) 0.083% nebulizer solution Commonly known as: PROVENTIL Take 2.5 mg by nebulization See admin instructions. 2.5mg  twice daily And 2.5mg  daily as needed for shortness of breath/wheezing   apixaban 2.5 MG Tabs tablet Commonly known as: ELIQUIS Take 1 tablet (2.5 mg total) by mouth 2 (two) times daily.   atorvastatin 20 MG tablet Commonly known as: LIPITOR Take 20 mg by mouth daily.   brimonidine 0.15 % ophthalmic solution Commonly known as: ALPHAGAN Place 1 drop into the left eye 2 (two) times daily.   clopidogrel 75 MG tablet Commonly known as: PLAVIX Take 75 mg by mouth daily. What changed: Another medication with the same name was removed. Continue taking this medication, and follow the directions you see here.   diltiazem 120 MG 24 hr capsule Commonly known as: CARDIZEM CD Take 1 capsule (120 mg total) by mouth daily. Start taking on: November 25, 2020   divalproex 125 MG DR tablet Commonly known as: DEPAKOTE Take 125 mg by mouth at bedtime.   donepezil 10 MG tablet Commonly known as: ARICEPT Take 10 mg by mouth daily with supper.   escitalopram 5 MG tablet Commonly known as: LEXAPRO Take 5 mg by mouth daily with supper.   finasteride 5 MG tablet Commonly known as: PROSCAR Take 5 mg by mouth every evening.   furosemide 40 MG tablet Commonly known as: LASIX Take 1 tablet (40 mg total) by mouth daily as needed for fluid or edema (and shortness of breath). What changed:  when to take this reasons to take this   hydrALAZINE 50 MG tablet Commonly known as: APRESOLINE Take 1 tablet (50 mg total) by mouth 3 (three) times daily as needed for up to 20 doses (for sbp > 140).   insulin lispro 100 UNIT/ML KiwkPen Commonly known as: HUMALOG Inject 2-5 Units  into the skin See admin instructions. 2-5 units per sliding scale   latanoprost 0.005 % ophthalmic solution Commonly known as: XALATAN Place 1 drop into both eyes at bedtime.   loratadine 10 MG tablet Commonly known as: CLARITIN Take 10 mg by mouth daily.   memantine 10 MG tablet Commonly known as: NAMENDA Take 10 mg by mouth 2 (two) times daily.   montelukast 10 MG tablet Commonly known as: SINGULAIR Take 10  mg by mouth daily.   multivitamin with minerals Tabs tablet Take 1 tablet by mouth daily.   Myrbetriq 50 MG Tb24 tablet Generic drug: mirabegron ER Take 50 mg by mouth every evening.   nitroGLYCERIN 0.4 MG SL tablet Commonly known as: NITROSTAT Place 0.4 mg under the tongue every 5 (five) minutes as needed for chest pain.   pantoprazole 40 MG tablet Commonly known as: PROTONIX Take 40 mg by mouth 2 (two) times daily.   predniSONE 5 MG tablet Commonly known as: DELTASONE Take 7.5 mg by mouth daily with breakfast.   sitaGLIPtin-metformin 50-1000 MG tablet Commonly known as: JANUMET Take 1 tablet by mouth 2 (two) times daily with a meal.   Trelegy Ellipta 100-62.5-25 MCG/INH Aepb Generic drug: Fluticasone-Umeclidin-Vilant Inhale 1 puff into the lungs daily.   vitamin C 500 MG tablet Commonly known as: ASCORBIC ACID Take 500 mg by mouth daily with supper.   Vitamin D 50 MCG (2000 UT) Caps Take 2,000 Units by mouth daily with supper.       ASK your doctor about these medications    ipratropium-albuterol 0.5-2.5 (3) MG/3ML Soln Commonly known as: DUONEB Take 3 mLs by nebulization in the morning, at noon, and at bedtime.        Follow-up Information     Nigel Mormon, MD Follow up on 12/04/2020.   Specialties: Cardiology, Radiology Why: 11:45 AM Virtual visit Contact information: [place] [address] [phone]         Leanna Battles, MD Follow up in 1 week(s).   Specialty: Internal  Medicine Contact information: [place] [address] [phone]                Allergies  Allergen Reactions   Ciprofloxacin Other (See Comments)    unk   Ciprofloxacin Hcl Other (See Comments)    Unknown reaction   Penicillins     Has patient had a PCN reaction causing immediate rash, facial/tongue/throat swelling, SOB or lightheadedness with hypotension: Unknown Has patient had a PCN reaction causing severe rash involving mucus membranes or skin necrosis: Unknown Has patient had a PCN reaction that required hospitalization Unknown Has patient had a PCN reaction occurring within the last 10 years: Unknown If all of the above answers are "NO", then may proceed with Cephalosporin use. Tolerate cephalosporins   Penicillins Other (See Comments)    unk   Sulfa Antibiotics Hives and Rash   Sulfamethoxazole Rash   Sulfonamide Derivatives Rash    The results of significant diagnostics from this hospitalization (including imaging, microbiology, ancillary and laboratory) are listed below for reference.    Microbiology: Recent Results (from the past 240 hour(s))  Resp Panel by RT-PCR (Flu A&B, Covid) Nasopharyngeal Swab     Status: None   Collection Time: 11/22/20 11:48 AM   Specimen: Nasopharyngeal Swab; Nasopharyngeal(NP) swabs in vial transport medium  Result Value Ref Range Status   SARS Coronavirus 2 by RT PCR NEGATIVE NEGATIVE Final    Comment: (NOTE) SARS-CoV-2 target nucleic acids are NOT DETECTED.  The SARS-CoV-2 RNA is generally detectable in upper respiratory specimens during the acute phase of infection. The lowest concentration of SARS-CoV-2 viral copies this assay can detect is 138 copies/mL. A negative result does not preclude SARS-Cov-2 infection and should not be used as the sole basis for treatment or other patient management decisions. A negative result may occur with  improper specimen collection/handling, submission of specimen  other than nasopharyngeal swab,  presence of viral mutation(s) within the areas targeted by this assay, and inadequate number of viral copies(<138 copies/mL). A negative result must be combined with clinical observations, patient history, and epidemiological information. The expected result is Negative.  Fact Sheet for Patients:  EntrepreneurPulse.com.au  Fact Sheet for Healthcare Providers:  IncredibleEmployment.be  This test is no t yet approved or cleared by the [place] FDA and  has been authorized for detection and/or diagnosis of SARS-CoV-2 by FDA under an Emergency Use Authorization (EUA). This EUA will remain  in effect (meaning this test can be used) for the duration of the COVID-19 declaration under Section 564(b)(1) of the Act, 21 U.S.C.section 360bbb-3(b)(1), unless the authorization is terminated  or revoked sooner.       Influenza A by PCR NEGATIVE NEGATIVE Final   Influenza B by PCR NEGATIVE NEGATIVE Final    Comment: (NOTE) The Xpert Xpress SARS-CoV-2/FLU/RSV plus assay is intended as an aid in the diagnosis of influenza from Nasopharyngeal swab specimens and should not be used as a sole basis for treatment. Nasal washings and aspirates are unacceptable for Xpert Xpress SARS-CoV-2/FLU/RSV testing.  Fact Sheet for Patients: EntrepreneurPulse.com.au  Fact Sheet for Healthcare Providers: IncredibleEmployment.be  This test is not yet approved or cleared by the [place] FDA and has been authorized for detection and/or diagnosis of SARS-CoV-2 by FDA under an Emergency Use Authorization (EUA). This EUA will remain in effect (meaning this test can be used) for the duration of the COVID-19 declaration under Section 564(b)(1) of the Act, 21 U.S.C. section 360bbb-3(b)(1), unless the authorization is terminated or revoked.  Performed at [facility], [place] [address], [place],  [place] [address]   Culture, blood (routine x 2)     Status: Abnormal (Preliminary result)   Collection Time: 11/22/20  6:36 PM   Specimen: BLOOD  Result Value Ref Range Status   Specimen Description BLOOD RIGHT ANTECUBITAL  Final   Special Requests   Final    BOTTLES DRAWN AEROBIC AND ANAEROBIC Blood Culture adequate volume   Culture  Setup Time   Final    GRAM POSITIVE COCCI IN CLUSTERS AEROBIC BOTTLE ONLY Organism ID to follow CRITICAL RESULT CALLED TO, READ BACK BY AND VERIFIED WITH: K HURTH PHARMD 1705 11/23/20 A BROWNING    Culture (A)  Final    STAPHYLOCOCCUS EPIDERMIDIS THE SIGNIFICANCE OF ISOLATING THIS ORGANISM FROM A SINGLE SET OF BLOOD CULTURES WHEN MULTIPLE SETS ARE DRAWN IS UNCERTAIN. PLEASE NOTIFY THE MICROBIOLOGY DEPARTMENT WITHIN ONE WEEK IF SPECIATION AND SENSITIVITIES ARE REQUIRED. Performed at [facility], [place] [address], [place], [place] [address]    Report Status PENDING  Incomplete  Blood Culture ID Panel (Reflexed)     Status: Abnormal   Collection Time: 11/22/20  6:36 PM  Result Value Ref Range Status   Enterococcus faecalis NOT DETECTED NOT DETECTED Final   Enterococcus Faecium NOT DETECTED NOT DETECTED Final   Listeria monocytogenes NOT DETECTED NOT DETECTED Final   Staphylococcus species DETECTED (A) NOT DETECTED Final    Comment: CRITICAL RESULT CALLED TO, READ BACK BY AND VERIFIED WITH: K HURTH PHARMD 1705 11/23/20 A BROWNING    Staphylococcus aureus (BCID) NOT DETECTED NOT DETECTED Final   Staphylococcus epidermidis DETECTED (A) NOT DETECTED Final    Comment: CRITICAL RESULT CALLED TO, READ BACK BY AND VERIFIED WITH: K HURTH PHARMD 1705 11/23/20 A BROWNING    Staphylococcus lugdunensis NOT DETECTED NOT DETECTED Final   Streptococcus species NOT DETECTED NOT DETECTED  Final   Streptococcus agalactiae NOT DETECTED NOT DETECTED Final   Streptococcus pneumoniae NOT DETECTED NOT DETECTED Final   Streptococcus pyogenes NOT DETECTED NOT DETECTED Final    A.calcoaceticus-baumannii NOT DETECTED NOT DETECTED Final   Bacteroides fragilis NOT DETECTED NOT DETECTED Final   Enterobacterales NOT DETECTED NOT DETECTED Final   Enterobacter cloacae complex NOT DETECTED NOT DETECTED Final   Escherichia coli NOT DETECTED NOT DETECTED Final   Klebsiella aerogenes NOT DETECTED NOT DETECTED Final   Klebsiella oxytoca NOT DETECTED NOT DETECTED Final   Klebsiella pneumoniae NOT DETECTED NOT DETECTED Final   Proteus species NOT DETECTED NOT DETECTED Final   Salmonella species NOT DETECTED NOT DETECTED Final   Serratia marcescens NOT DETECTED NOT DETECTED Final   Haemophilus influenzae NOT DETECTED NOT DETECTED Final   Neisseria meningitidis NOT DETECTED NOT DETECTED Final   Pseudomonas aeruginosa NOT DETECTED NOT DETECTED Final   Stenotrophomonas maltophilia NOT DETECTED NOT DETECTED Final   Candida albicans NOT DETECTED NOT DETECTED Final   Candida auris NOT DETECTED NOT DETECTED Final   Candida glabrata NOT DETECTED NOT DETECTED Final   Candida krusei NOT DETECTED NOT DETECTED Final   Candida parapsilosis NOT DETECTED NOT DETECTED Final   Candida tropicalis NOT DETECTED NOT DETECTED Final   Cryptococcus neoformans/gattii NOT DETECTED NOT DETECTED Final   Methicillin resistance mecA/C NOT DETECTED NOT DETECTED Final    Comment: Performed at [facility], [address], [place], [place] [address]  Culture, blood (routine x 2)     Status: None (Preliminary result)   Collection Time: 11/22/20  7:00 PM   Specimen: BLOOD  Result Value Ref Range Status   Specimen Description BLOOD RIGHT ANTECUBITAL  Final   Special Requests   Final    BOTTLES DRAWN AEROBIC ONLY Blood Culture adequate volume   Culture   Final    NO GROWTH 2 DAYS Performed at [facility], [place] [address] [place], [place] [address]    Report Status PENDING  Incomplete    Procedures/Studies: DG Chest 1 View  Result Date: 11/22/2020 CLINICAL DATA:  Weakness. EXAM: CHEST  1  VIEW COMPARISON:  November 13, 2020. FINDINGS: The heart size and mediastinal contours are within normal limits. Both lungs are clear. The visualized skeletal structures are unremarkable. IMPRESSION: No active disease. Electronically Signed   By: Marijo Conception M.D.   On: 11/22/2020 12:12   CT Head Wo Contrast  Result Date: 11/22/2020 CLINICAL DATA:  Weakness.  Syncope. EXAM: CT HEAD WITHOUT CONTRAST TECHNIQUE: Contiguous axial images were obtained from the base of the skull through the vertex without intravenous contrast. COMPARISON:  11/13/2020 FINDINGS: Brain: Motion degraded study shows no evidence for acute hemorrhage, hydrocephalus, mass lesion, or abnormal extra-axial fluid collection. Diffuse loss of parenchymal volume is consistent with atrophy. Patchy low attenuation in the deep hemispheric and periventricular white matter is nonspecific, but likely reflects chronic microvascular ischemic demyelination. Lacunar infarct again noted right thalamus and left subinsular white matter. Vascular: No hyperdense vessel or unexpected calcification. Skull: No evidence for fracture. No worrisome lytic or sclerotic lesion. Sinuses/Orbits: Focal thickening posterior right ethmoid sinus is similar to prior. Remaining visualized paranasal sinuses are clear. Postoperative changes noted right mastoid region. Visualized portions of the globes and intraorbital fat are unremarkable. Other: None. IMPRESSION: 1. Stable exam.  No acute intracranial abnormality. 2. Atrophy with chronic small vessel white matter ischemic disease. Electronically Signed   By: Misty Stanley M.D.   On: 11/22/2020 12:48  CT HEAD WO CONTRAST  Result Date: 11/13/2020 CLINICAL DATA:  Multiple falls, blood thinners EXAM: CT HEAD WITHOUT CONTRAST CT CERVICAL SPINE WITHOUT CONTRAST TECHNIQUE: Multidetector CT imaging of the head and cervical spine was performed following the standard protocol without intravenous contrast. Multiplanar CT image  reconstructions of the cervical spine were also generated. COMPARISON:  05/07/2017 FINDINGS: CT HEAD FINDINGS Brain: No evidence of acute infarction, hemorrhage, hydrocephalus, extra-axial collection or mass lesion/mass effect. Extent periventricular and deep white matter hypodensity. Vascular: No hyperdense vessel or unexpected calcification. Skull: Normal. Negative for fracture or focal lesion. Sinuses/Orbits: No acute finding. Other: None. CT CERVICAL SPINE FINDINGS Alignment: Normal. Skull base and vertebrae: No acute fracture. No primary bone lesion or focal pathologic process. Soft tissues and spinal canal: No prevertebral fluid or swelling. No visible canal hematoma. Disc levels: Moderate to severe multilevel disc space height loss and osteophytosis, worst at C5 through C7. Upper chest: Negative. Other: None. IMPRESSION: 1. No acute intracranial pathology. Advanced small-vessel white matter disease. 2. No fracture or static subluxation of the cervical spine. 3. Moderate to severe multilevel disc space height loss and osteophytosis, worst at C5 through C7. Electronically Signed   By: Eddie Candle M.D.   On: 11/13/2020 12:30   CT CERVICAL SPINE WO CONTRAST  Result Date: 11/13/2020 CLINICAL DATA:  Multiple falls, blood thinners EXAM: CT HEAD WITHOUT CONTRAST CT CERVICAL SPINE WITHOUT CONTRAST TECHNIQUE: Multidetector CT imaging of the head and cervical spine was performed following the standard protocol without intravenous contrast. Multiplanar CT image reconstructions of the cervical spine were also generated. COMPARISON:  05/07/2017 FINDINGS: CT HEAD FINDINGS Brain: No evidence of acute infarction, hemorrhage, hydrocephalus, extra-axial collection or mass lesion/mass effect. Extent periventricular and deep white matter hypodensity. Vascular: No hyperdense vessel or unexpected calcification. Skull: Normal. Negative for fracture or focal lesion. Sinuses/Orbits: No acute finding. Other: None. CT CERVICAL  SPINE FINDINGS Alignment: Normal. Skull base and vertebrae: No acute fracture. No primary bone lesion or focal pathologic process. Soft tissues and spinal canal: No prevertebral fluid or swelling. No visible canal hematoma. Disc levels: Moderate to severe multilevel disc space height loss and osteophytosis, worst at C5 through C7. Upper chest: Negative. Other: None. IMPRESSION: 1. No acute intracranial pathology. Advanced small-vessel white matter disease. 2. No fracture or static subluxation of the cervical spine. 3. Moderate to severe multilevel disc space height loss and osteophytosis, worst at C5 through C7. Electronically Signed   By: Eddie Candle M.D.   On: 11/13/2020 12:30   DG Pelvis Portable  Result Date: 11/13/2020 CLINICAL DATA:  Fall. EXAM: PORTABLE PELVIS 1-2 VIEWS COMPARISON:  CT AP 11/06/2019 FINDINGS: There is no evidence of pelvic fracture or diastasis. Mild bilateral hip osteoarthritis. Atherosclerotic calcifications are noted involving the femoral arteries. Seed implants noted within the prostate gland. No pelvic bone lesions are seen. IMPRESSION: Negative. Electronically Signed   By: Kerby Moors M.D.   On: 11/13/2020 12:21   DG Chest Port 1 View  Result Date: 11/13/2020 CLINICAL DATA:  Fall, patient on blood thinners. EXAM: PORTABLE CHEST 1 VIEW COMPARISON:  November 06, 2019. FINDINGS: EKG leads project over the chest. Cardiomediastinal contours and hilar structures are stable accounting for slight rotation on the current assessment. Lungs are clear. No visible pneumothorax or pleural effusion on portable AP projection. On limited assessment no acute skeletal process. IMPRESSION: No acute cardiopulmonary disease. Electronically Signed   By: Zetta Bills M.D.   On: 11/13/2020 12:22   ECHOCARDIOGRAM COMPLETE  Result Date:  11/23/2020    ECHOCARDIOGRAM REPORT   Patient Name:   [name] Date of Exam: 11/23/2020 Medical Rec #:  [id number]     Height:       70.0 in Accession #:     [id number]    Weight:       161.3 lb Date of Birth:  1929/04/13     BSA:          1.905 m Patient Age:    87 years      BP:           158/56 mmHg Patient Gender: M             HR:           89 bpm. Exam Location:  Inpatient Procedure: 2D Echo, 3D Echo, Cardiac Doppler and Color Doppler Indications:     R55 Syncope  History:         Patient has prior history of Echocardiogram examinations, most                  recent 11/07/2019. CHF, Abnormal ECG, COPD, Arrythmias:Atrial                  Fibrillation, Signs/Symptoms:Alzheimer's and Syncope; Risk                  Factors:Dyslipidemia and Sleep Apnea.  Sonographer:     Roseanna Rainbow RDCS Referring Phys:  [id number] Orma Flaming Diagnosing Phys: Adrian Prows MD IMPRESSIONS  1. Left ventricular ejection fraction, by estimation, is 60 to 65%. The left ventricle has normal function. Left ventricular endocardial border not optimally defined to evaluate regional wall motion. Left ventricular diastolic parameters are consistent with Grade II diastolic dysfunction (pseudonormalization). Elevated left ventricular end-diastolic pressure.  2. Right ventricular systolic function is normal. The right ventricular size is normal.  3. Left atrial size was mildly dilated.  4. Mild degenerative (senile) changes. The mitral valve is degenerative. Mild mitral valve regurgitation. No evidence of mitral stenosis. The mean mitral valve gradient is 4.5 mmHg.  5. The aortic valve is tricuspid. There is mild calcification of the aortic valve. Aortic valve regurgitation is not visualized. Mild aortic valve sclerosis is present, with no evidence of aortic valve stenosis.  6. The inferior vena cava is normal in size with <50% respiratory variability, suggesting right atrial pressure of 8 mmHg. FINDINGS  Left Ventricle: Left ventricular ejection fraction, by estimation, is 60 to 65%. The left ventricle has normal function. Left ventricular endocardial border not optimally defined to evaluate regional wall  motion. The left ventricular internal cavity size was normal in size. There is no left ventricular hypertrophy. Left ventricular diastolic parameters are consistent with Grade II diastolic dysfunction (pseudonormalization). Elevated left ventricular end-diastolic pressure. Right Ventricle: The right ventricular size is normal. No increase in right ventricular wall thickness. Right ventricular systolic function is normal. Left Atrium: Left atrial size was mildly dilated. Right Atrium: Right atrial size was normal in size. Pericardium: There is no evidence of pericardial effusion. Mitral Valve: Mild degenerative (senile) changes. The mitral valve is degenerative in appearance. There is mild calcification of the mitral valve leaflet(s). Mild mitral annular calcification. Mild mitral valve regurgitation. No evidence of mitral valve stenosis. MV peak gradient, 13.0 mmHg. The mean mitral valve gradient is 4.5 mmHg. Tricuspid Valve: The tricuspid valve is normal in structure. Tricuspid valve regurgitation is trivial. No evidence of tricuspid stenosis. Aortic Valve: The aortic valve is tricuspid. There is mild calcification  of the aortic valve. Aortic valve regurgitation is not visualized. Mild aortic valve sclerosis is present, with no evidence of aortic valve stenosis. Pulmonic Valve: The pulmonic valve was normal in structure. Pulmonic valve regurgitation is trivial. No evidence of pulmonic stenosis. Aorta: The aortic root is normal in size and structure. Venous: The inferior vena cava is normal in size with less than 50% respiratory variability, suggesting right atrial pressure of 8 mmHg. IAS/Shunts: No atrial level shunt detected by color flow Doppler.  LEFT VENTRICLE PLAX 2D LVIDd:         4.40 cm     Diastology LVIDs:         2.50 cm     LV e' medial:    6.96 cm/s LV PW:         1.20 cm     LV E/e' medial:  20.0 LV IVS:        1.70 cm     LV e' lateral:   6.48 cm/s LVOT diam:     1.80 cm     LV E/e' lateral: 21.5 LV  SV:         78 LV SV Index:   41 LVOT Area:     2.54 cm  LV Volumes (MOD) LV vol d, MOD A2C: 67.8 ml LV vol d, MOD A4C: 65.1 ml LV vol s, MOD A2C: 11.0 ml LV vol s, MOD A4C: 13.6 ml LV SV MOD A2C:     56.8 ml LV SV MOD A4C:     65.1 ml LV SV MOD BP:      55.2 ml RIGHT VENTRICLE             IVC RV S prime:     17.30 cm/s  IVC diam: 2.00 cm TAPSE (M-mode): 2.5 cm LEFT ATRIUM             Index       RIGHT ATRIUM           Index LA diam:        4.60 cm 2.41 cm/m  RA Area:     11.90 cm LA Vol (A2C):   51.2 ml 26.88 ml/m RA Volume:   24.20 ml  12.70 ml/m LA Vol (A4C):   40.0 ml 21.00 ml/m LA Biplane Vol: 49.1 ml 25.77 ml/m  AORTIC VALVE             PULMONIC VALVE LVOT Vmax:   132.00 cm/s PR End Diast Vel: 1.33 msec LVOT Vmean:  89.400 cm/s LVOT VTI:    0.307 m  AORTA Ao Root diam: 3.50 cm Ao Asc diam:  3.30 cm MITRAL VALVE MV Area (PHT): 3.31 cm     SHUNTS MV Area VTI:   1.24 cm     Systemic VTI:  0.31 m MV Peak grad:  13.0 mmHg    Systemic Diam: 1.80 cm MV Mean grad:  4.5 mmHg MV Vmax:       1.80 m/s MV Vmean:      90.6 cm/s MV Decel Time: 229 msec MV E velocity: 139.00 cm/s MV A velocity: 136.00 cm/s MV E/A ratio:  1.02 Adrian Prows MD Electronically signed by Adrian Prows MD Signature Date/Time: 11/23/2020/3:21:23 PM    Final     Labs: BNP (last 3 results) Recent Labs    11/22/20 1141  BNP 709.6*   Basic Metabolic Panel: Recent Labs  Lab 11/22/20 1104 11/23/20 0326 11/24/20 0652  NA 136 143 141  K 4.1 4.0 3.7  CL 105 110 107  CO2 24 26 29   GLUCOSE 316* 245* 152*  BUN 40* 39* 28*  CREATININE 1.99* 1.81* 1.43*  CALCIUM 8.0* 8.5* 8.7*  MG  --   --  1.8  PHOS  --   --  2.5   Liver Function Tests: Recent Labs  Lab 11/22/20 1104  AST 13*  ALT 15  ALKPHOS 69  BILITOT 0.4  PROT 4.8*  ALBUMIN 2.3*   No results for input(s): LIPASE, AMYLASE in the last 168 hours. No results for input(s): AMMONIA in the last 168 hours. CBC: Recent Labs  Lab 11/22/20 1104 11/23/20 0326  WBC 13.9* 9.8   NEUTROABS 10.5*  --   HGB 8.4* 8.9*  HCT 26.0* 27.8*  MCV 81.3 80.1  PLT 160 169   Cardiac Enzymes: No results for input(s): CKTOTAL, CKMB, CKMBINDEX, TROPONINI in the last 168 hours. BNP: Invalid input(s): POCBNP CBG: Recent Labs  Lab 11/23/20 0726 11/23/20 1145 11/23/20 1632 11/23/20 2110 11/24/20 0746  GLUCAP 143* 331* 292* 304* 155*   D-Dimer No results for input(s): DDIMER in the last 72 hours. Hgb A1c No results for input(s): HGBA1C in the last 72 hours. Lipid Profile No results for input(s): CHOL, HDL, LDLCALC, TRIG, CHOLHDL, LDLDIRECT in the last 72 hours. Thyroid function studies Recent Labs    11/22/20 1550  TSH 2.839   Anemia work up No results for input(s): VITAMINB12, FOLATE, FERRITIN, TIBC, IRON, RETICCTPCT in the last 72 hours. Urinalysis    Component Value Date/Time   COLORURINE YELLOW 11/22/2020 1629   APPEARANCEUR HAZY (A) 11/22/2020 1629   LABSPEC 1.009 11/22/2020 1629   PHURINE 5.0 11/22/2020 1629   GLUCOSEU >=500 (A) 11/22/2020 1629   HGBUR NEGATIVE 11/22/2020 1629   BILIRUBINUR NEGATIVE 11/22/2020 1629   KETONESUR NEGATIVE 11/22/2020 1629   PROTEINUR NEGATIVE 11/22/2020 1629   UROBILINOGEN 0.2 09/24/2014 1442   NITRITE NEGATIVE 11/22/2020 1629   LEUKOCYTESUR NEGATIVE 11/22/2020 1629   Sepsis Labs Invalid input(s): PROCALCITONIN,  WBC,  LACTICIDVEN Microbiology Recent Results (from the past 240 hour(s))  Resp Panel by RT-PCR (Flu A&B, Covid) Nasopharyngeal Swab     Status: None   Collection Time: 11/22/20 11:48 AM   Specimen: Nasopharyngeal Swab; Nasopharyngeal(NP) swabs in vial transport medium  Result Value Ref Range Status   SARS Coronavirus 2 by RT PCR NEGATIVE NEGATIVE Final    Comment: (NOTE) SARS-CoV-2 target nucleic acids are NOT DETECTED.  The SARS-CoV-2 RNA is generally detectable in upper respiratory specimens during the acute phase of infection. The lowest concentration of SARS-CoV-2 viral copies this assay can detect  is 138 copies/mL. A negative result does not preclude SARS-Cov-2 infection and should not be used as the sole basis for treatment or other patient management decisions. A negative result may occur with  improper specimen collection/handling, submission of specimen other than nasopharyngeal swab, presence of viral mutation(s) within the areas targeted by this assay, and inadequate number of viral copies(<138 copies/mL). A negative result must be combined with clinical observations, patient history, and epidemiological information. The expected result is Negative.  Fact Sheet for Patients:  EntrepreneurPulse.com.au  Fact Sheet for Healthcare Providers:  IncredibleEmployment.be  This test is no t yet approved or cleared by the [place] FDA and  has been authorized for detection and/or diagnosis of SARS-CoV-2 by FDA under an Emergency Use Authorization (EUA). This EUA will remain  in effect (meaning this test can be used) for the duration of the COVID-19 declaration under Section 564(b)(1) of the  Act, 21 U.S.C.section 360bbb-3(b)(1), unless the authorization is terminated  or revoked sooner.       Influenza A by PCR NEGATIVE NEGATIVE Final   Influenza B by PCR NEGATIVE NEGATIVE Final    Comment: (NOTE) The Xpert Xpress SARS-CoV-2/FLU/RSV plus assay is intended as an aid in the diagnosis of influenza from Nasopharyngeal swab specimens and should not be used as a sole basis for treatment. Nasal washings and aspirates are unacceptable for Xpert Xpress SARS-CoV-2/FLU/RSV testing.  Fact Sheet for Patients: EntrepreneurPulse.com.au  Fact Sheet for Healthcare Providers: IncredibleEmployment.be  This test is not yet approved or cleared by the [place] FDA and has been authorized for detection and/or diagnosis of SARS-CoV-2 by FDA under an Emergency Use Authorization (EUA). This EUA will remain in effect  (meaning this test can be used) for the duration of the COVID-19 declaration under Section 564(b)(1) of the Act, 21 U.S.C. section 360bbb-3(b)(1), unless the authorization is terminated or revoked.  Performed at [facility], [place] [address], [place], [place] [address]   Culture, blood (routine x 2)     Status: Abnormal (Preliminary result)   Collection Time: 11/22/20  6:36 PM   Specimen: BLOOD  Result Value Ref Range Status   Specimen Description BLOOD RIGHT ANTECUBITAL  Final   Special Requests   Final    BOTTLES DRAWN AEROBIC AND ANAEROBIC Blood Culture adequate volume   Culture  Setup Time   Final    GRAM POSITIVE COCCI IN CLUSTERS AEROBIC BOTTLE ONLY Organism ID to follow CRITICAL RESULT CALLED TO, READ BACK BY AND VERIFIED WITH: K HURTH PHARMD 1705 11/23/20 A BROWNING    Culture (A)  Final    STAPHYLOCOCCUS EPIDERMIDIS THE SIGNIFICANCE OF ISOLATING THIS ORGANISM FROM A SINGLE SET OF BLOOD CULTURES WHEN MULTIPLE SETS ARE DRAWN IS UNCERTAIN. PLEASE NOTIFY THE MICROBIOLOGY DEPARTMENT WITHIN ONE WEEK IF SPECIATION AND SENSITIVITIES ARE REQUIRED. Performed at [facility], [place] [address], [place], [place] [address]    Report Status PENDING  Incomplete  Blood Culture ID Panel (Reflexed)     Status: Abnormal   Collection Time: 11/22/20  6:36 PM  Result Value Ref Range Status   Enterococcus faecalis NOT DETECTED NOT DETECTED Final   Enterococcus Faecium NOT DETECTED NOT DETECTED Final   Listeria monocytogenes NOT DETECTED NOT DETECTED Final   Staphylococcus species DETECTED (A) NOT DETECTED Final    Comment: CRITICAL RESULT CALLED TO, READ BACK BY AND VERIFIED WITH: K HURTH PHARMD 1705 11/23/20 A BROWNING    Staphylococcus aureus (BCID) NOT DETECTED NOT DETECTED Final   Staphylococcus epidermidis DETECTED (A) NOT DETECTED Final    Comment: CRITICAL RESULT CALLED TO, READ BACK BY AND VERIFIED WITH: K HURTH PHARMD 1705 11/23/20 A BROWNING    Staphylococcus lugdunensis  NOT DETECTED NOT DETECTED Final   Streptococcus species NOT DETECTED NOT DETECTED Final   Streptococcus agalactiae NOT DETECTED NOT DETECTED Final   Streptococcus pneumoniae NOT DETECTED NOT DETECTED Final   Streptococcus pyogenes NOT DETECTED NOT DETECTED Final   A.calcoaceticus-baumannii NOT DETECTED NOT DETECTED Final   Bacteroides fragilis NOT DETECTED NOT DETECTED Final   Enterobacterales NOT DETECTED NOT DETECTED Final   Enterobacter cloacae complex NOT DETECTED NOT DETECTED Final   Escherichia coli NOT DETECTED NOT DETECTED Final   Klebsiella aerogenes NOT DETECTED NOT DETECTED Final   Klebsiella oxytoca NOT DETECTED NOT DETECTED Final   Klebsiella pneumoniae NOT DETECTED NOT DETECTED Final   Proteus species NOT DETECTED NOT DETECTED Final  Salmonella species NOT DETECTED NOT DETECTED Final   Serratia marcescens NOT DETECTED NOT DETECTED Final   Haemophilus influenzae NOT DETECTED NOT DETECTED Final   Neisseria meningitidis NOT DETECTED NOT DETECTED Final   Pseudomonas aeruginosa NOT DETECTED NOT DETECTED Final   Stenotrophomonas maltophilia NOT DETECTED NOT DETECTED Final   Candida albicans NOT DETECTED NOT DETECTED Final   Candida auris NOT DETECTED NOT DETECTED Final   Candida glabrata NOT DETECTED NOT DETECTED Final   Candida krusei NOT DETECTED NOT DETECTED Final   Candida parapsilosis NOT DETECTED NOT DETECTED Final   Candida tropicalis NOT DETECTED NOT DETECTED Final   Cryptococcus neoformans/gattii NOT DETECTED NOT DETECTED Final   Methicillin resistance mecA/C NOT DETECTED NOT DETECTED Final    Comment: Performed at [facility], [address] [place], [place] [address]  Culture, blood (routine x 2)     Status: None (Preliminary result)   Collection Time: 11/22/20  7:00 PM   Specimen: BLOOD  Result Value Ref Range Status   Specimen Description BLOOD RIGHT ANTECUBITAL  Final   Special Requests   Final    BOTTLES DRAWN AEROBIC ONLY Blood Culture adequate  volume   Culture   Final    NO GROWTH 2 DAYS Performed at [facility], [place] [address] [place], [place] [address]    Report Status PENDING  Incomplete     Time coordinating discharge: 25 minutes  SIGNED: Antonieta Pert, MD  [facility] 11/24/2020, 11:10 AM  If 7PM-7AM, please contact night-coverage [email or web address]

## 2020-11-24 NOTE — Progress Notes (Addendum)
Subjective:  Feels well No complaints  HR stayed <80 bpm Only occasional PAC  Objective:  Vital Signs in the last 24 hours: Temp:  [97.6 F (36.4 C)-97.9 F (36.6 C)] 97.6 F (36.4 C) (07/26 0735) Pulse Rate:  [55-72] 64 (07/26 0735) Resp:  [15-22] 18 (07/26 0735) BP: (119-193)/(45-88) 143/62 (07/26 0932) SpO2:  [94 %-100 %] 97 % (07/26 0735) Weight:  [73.4 kg] 73.4 kg (07/26 0400)  Intake/Output from previous day: 07/25 0701 - 07/26 0700 In: 25.2 [I.V.:25.2] Out: -   Physical Exam Vitals and nursing note reviewed.  Constitutional:      General: He is not in acute distress.    Appearance: He is well-developed.  HENT:     Head: Normocephalic and atraumatic.  Eyes:     Conjunctiva/sclera: Conjunctivae normal.     Pupils: Pupils are equal, round, and reactive to light.  Neck:     Vascular: No JVD.  Cardiovascular:     Rate and Rhythm: Normal rate and regular rhythm.     Pulses: Normal pulses and intact distal pulses.     Heart sounds: No murmur heard. Pulmonary:     Effort: Pulmonary effort is normal.     Breath sounds: Normal breath sounds. No wheezing or rales.  Abdominal:     General: Bowel sounds are normal.     Palpations: Abdomen is soft.     Tenderness: There is no rebound.  Musculoskeletal:        General: No tenderness. Normal range of motion.     Right lower leg: No edema.     Left lower leg: No edema.  Lymphadenopathy:     Cervical: No cervical adenopathy.  Skin:    General: Skin is warm and dry.  Neurological:     Mental Status: He is alert and oriented to person, place, and time.     Cranial Nerves: No cranial nerve deficit.     Lab Results: BMP Recent Labs    11/22/20 1104 11/23/20 0326 11/24/20 0652  NA 136 143 141  K 4.1 4.0 3.7  CL 105 110 107  CO2 24 26 29   GLUCOSE 316* 245* 152*  BUN 40* 39* 28*  CREATININE 1.99* 1.81* 1.43*  CALCIUM 8.0* 8.5* 8.7*  GFRNONAA 32* 35* 47*     CBC Recent Labs  Lab 11/22/20 1104  11/23/20 0326  WBC 13.9* 9.8  RBC 3.20* 3.47*  HGB 8.4* 8.9*  HCT 26.0* 27.8*  PLT 160 169  MCV 81.3 80.1  MCH 26.3 25.6*  MCHC 32.3 32.0  RDW 15.3 15.5  LYMPHSABS 1.3  --   MONOABS 1.1*  --   EOSABS 0.7*  --   BASOSABS 0.1  --      HEMOGLOBIN A1C Lab Results  Component Value Date   HGBA1C 9.4 (H) 11/07/2019   MPG 223.08 11/07/2019     BNP (last 3 results) Recent Labs    11/22/20 1141  BNP 151.3*     TSH Recent Labs    11/13/20 1145 11/22/20 1550  TSH 5.912* 2.839     Lipid Panel     Component Value Date/Time   CHOL 109 01/03/2012 1305   TRIG 44.0 01/03/2012 1305   HDL 60.20 01/03/2012 1305   CHOLHDL 2 01/03/2012 1305   VLDL 8.8 01/03/2012 1305   LDLCALC 40 01/03/2012 1305     Hepatic Function Panel Recent Labs    11/13/20 1145 11/22/20 1104  PROT 4.9* 4.8*  ALBUMIN 2.5* 2.3*  AST 13*  13*  ALT 16 15  ALKPHOS 71 64  BILITOT 0.5 0.4  BILIDIR  --  <0.1  IBILI  --  NOT CALCULATED      Cardiac Studies:  Echocardiogram 11/23/2020:  1. Left ventricular ejection fraction, by estimation, is 60 to 65%. The  left ventricle has normal function. Left ventricular endocardial border  not optimally defined to evaluate regional wall motion. Left ventricular  diastolic parameters are consistent  with Grade II diastolic dysfunction (pseudonormalization). Elevated left  ventricular end-diastolic pressure.   2. Right ventricular systolic function is normal. The right ventricular  size is normal.   3. Left atrial size was mildly dilated.   4. Mild degenerative (senile) changes. The mitral valve is degenerative.  Mild mitral valve regurgitation. No evidence of mitral stenosis. The mean  mitral valve gradient is 4.5 mmHg.   5. The aortic valve is tricuspid. There is mild calcification of the  aortic valve. Aortic valve regurgitation is not visualized. Mild aortic  valve sclerosis is present, with no evidence of aortic valve stenosis.   6. The inferior  vena cava is normal in size with <50% respiratory  variability, suggesting right atrial pressure of 8 mmHg.   EKG 11/22/2020: Sinus bradycardia 50 bpm with occasional PAC   Telemetry 11/22/2020: Narrow complex, regular tachcyardia Brief slowing down with IV metoprolol 2.5 mg IV Possible atypical atrial flutter       Echocardiogram 11/07/2019:  1. Left ventricular ejection fraction, by estimation, is 65 to 70%. Left  ventricular ejection fraction by PLAX is 64 %. The left ventricle has  normal function. The left ventricle has no regional wall motion  abnormalities. There is moderate left  ventricular hypertrophy. Left ventricular diastolic parameters are  consistent with Grade II diastolic dysfunction (pseudonormalization).  Elevated left ventricular end-diastolic pressure.   2. Right ventricular systolic function is normal. The right ventricular  size is normal.   3. The mitral valve is degenerative. No evidence of mitral valve  regurgitation.   4. The aortic valve is tricuspid. Aortic valve regurgitation is not  visualized.    Assessment & Recommendations:  87 y/o [place] male with hypertension, type 2 DM, CAD, dementia, admitted with syncope, paroxysmal atrial flutter   Syncope: Differentials include dehydration, orthostatic hypotension, tachybrady syndrome, post conversion syncope. Atrial flutter with RVR on 11/22/2020, converted to sinus rhythm around 8 PM on IV diltiazem. I have not seen any significant bradyarrhtymias. No indication for pacemaker at this time.  Paroxysmal atrial flutter: Currently in sinus rhythm in 60s on diltiazem 5 mg IV Convert to PO diltiazem 120 mg daily. Stopped amlodipine. CHA2DS2VASc score 6, annual stroke risk 9%. Recommend eliquis 2.5 mg bid Stopped Aspirin and plavix   Troponin elevation: Type 2 MI in the setting of syncope, now tachycardia. Not ACS  Lactic acid elevation: Cleared  Hypertension: Added hydralazine 50 mg tid prn, if SBP  >140 mmHg   I spoke with his [name]. Updated him about the plan.  From my standpoint, okay to discharge home today. Has 24X7 care at home. OT/PT recommendations  Will arrange virtual visit follow up   Nigel Mormon, MD Pager: [phone] Office: [phone]

## 2020-11-24 NOTE — TOC Transition Note (Signed)
Transition of Care [facility]) - CM/SW Discharge Note   Patient Details  Name: [name] MRN: [id number] Date of Birth: March 14, 1929  Transition of Care [facility] CM/SW Contact:  Zenon Mayo, RN Phone Number: 11/24/2020, 12:46 PM   Clinical Narrative:    NCM offered choice for HHPT, wife states Alvis Lemmings will be ok.  NCM made referral to [facility] with [facility].  He is able to take referral.  Soc will begin 24 to 48 hrs post dc.  Sitter is also in the room , Sharman Cheek will be transporting patient and wife home.  Patient has a walker at home and a w/chair.  He has no other needs.    Final next level of care: [place] Barriers to Discharge: No Barriers Identified   Patient Goals and CMS Choice Patient states their goals for this hospitalization and ongoing recovery are:: return home CMS Medicare.gov Compare Post Acute Care list provided to:: Patient Represenative (must comment) Choice offered to / list presented to : Spouse  Discharge Placement                       Discharge Plan and Services                  DME Agency: NA       HH Arranged: PT HH Agency: [place] Date [place]: 11/24/20 Time [place]: [id number] Representative spoke with at [place]: [place] ([place]) Interventions     Readmission Risk Interventions No flowsheet data found.

## 2020-11-24 NOTE — Progress Notes (Signed)
Occupational Therapy Evaluation Patient Details Name: [name] MRN: [id number] DOB: 25-Mar-1929 Today's Date: 11/24/2020    History of Present Illness [name] is a 87 y.o. male with who presented from home 7/24 after several syncopal episodes, headache and he was not responsive after the last episode, no seizure like activity. He came to ER on 7/15 for hypotension and dehydration and was sent home. Medical history significant of CAD s/p stent, prior CVA, COPD, DM2, HTN, GERD, OSA, alzheimers disease,  CHF, HLD, iron deficiency anemia   Clinical Impression   [name] was evaluated s/p the above impairments. PTA he received assistance for all ADLs and ambulates with a rollator, pt receives 24/7 care from family and personal care aides. Pt was sleeping upon arrival, easy to rouse. Pt was alert this session, followed one step commands consistently and answered questions appropriately. He was min A for bed mobility and close min guard for room distance ambulation. Pt is currently performing ADLs at his baseline. Pt does not required further OT services acutely, and recommend d/c home with continued 24/7 care.     Follow Up Recommendations  No OT follow up;Supervision/Assistance - 24 hour    Equipment Recommendations  None recommended by OT       Precautions / Restrictions Precautions Precautions: Fall Restrictions Weight Bearing Restrictions: No      Mobility Bed Mobility Overal bed mobility: Needs Assistance Bed Mobility: Sit to Supine       Sit to supine: Min assist   General bed mobility comments: min A for trunk elevation    Transfers Overall transfer level: Needs assistance Equipment used: Rolling walker (2 wheeled) Transfers: Sit to/from Stand Sit to Stand: Min guard         General transfer comment: cues for hand placement.    Balance Overall balance assessment: Needs assistance Sitting-balance support: Feet supported Sitting balance-Leahy Scale: Fair      Standing balance support: Single extremity supported;During functional activity Standing balance-Leahy Scale: Fair Standing balance comment: complete functional task in static standing at sink - benefits from BUE support on walker fro balance and safety                           ADL either performed or assessed with clinical judgement   ADL Overall ADL's : Needs assistance/impaired Eating/Feeding: Independent;Sitting   Grooming: Wash/dry hands;Wash/dry face;Oral care;Applying deodorant;Min guard;Standing   Upper Body Bathing: Min guard;Sitting   Lower Body Bathing: Min guard;Sit to/from stand   Upper Body Dressing : Set up;Sitting   Lower Body Dressing: Minimal assistance;Sit to/from stand   Toilet Transfer: Min guard;RW;Ambulation;Grab bars   Toileting- Water quality scientist and Hygiene: Min guard;Sit to/from stand       Functional mobility during ADLs: Min guard;Rolling walker General ADL Comments: Pt at baseline     Vision Baseline Vision/History: No visual deficits Patient Visual Report: No change from baseline              Pertinent Vitals/Pain Pain Assessment: No/denies pain     Hand Dominance Right   Extremity/Trunk Assessment Upper Extremity Assessment Upper Extremity Assessment: Generalized weakness   Lower Extremity Assessment Lower Extremity Assessment: Defer to PT evaluation   Cervical / Trunk Assessment Cervical / Trunk Assessment: Normal   Communication Communication Communication: HOH   Cognition Arousal/Alertness: Awake/alert Behavior During Therapy: Flat affect Overall Cognitive Status: History of cognitive impairments - at baseline  General Comments  VSS throughout session, no report of dizziness or pain            Home Living Family/patient expects to be discharged to:: Private residence Living Arrangements: Spouse/significant other Available Help at Discharge: Personal care  attendant;Family;Available 24 hours/day Type of Home: House Home Access: Ramped entrance     Home Layout: Two level;Able to live on main level with bedroom/bathroom     Bathroom Shower/Tub: Occupational psychologist: Handicapped height Bathroom Accessibility: Yes How Accessible: Accessible via walker Home Equipment: Grab bars - toilet;Grab bars - tub/shower;Walker - 4 wheels;Cane - quad;Shower seat          Prior Functioning/Environment Level of Independence: Needs assistance  Gait / Transfers Assistance Needed: uses rollator for mobility ADL's / Homemaking Assistance Needed: assistance for bathing and dressing, aide states he can do it indep however recieves additance for safety Communication / Swallowing Assistance Needed: HOH Comments: Pt has aides that come, wife and family assist; he has care 24/7. Pt alert this session and following all commands        OT Problem List: Decreased range of motion;Decreased strength;Decreased activity tolerance;Impaired balance (sitting and/or standing);Decreased knowledge of use of DME or AE;Decreased safety awareness;Decreased cognition      OT Treatment/Interventions:      OT Goals(Current goals can be found in the care plan section) Acute Rehab OT Goals Patient Stated Goal: patient wants to go home   AM-PAC OT "6 Clicks" Daily Activity     Outcome Measure Help from another person eating meals?: None Help from another person taking care of personal grooming?: A Little Help from another person toileting, which includes using toliet, bedpan, or urinal?: A Little Help from another person bathing (including washing, rinsing, drying)?: A Little Help from another person to put on and taking off regular upper body clothing?: A Little Help from another person to put on and taking off regular lower body clothing?: A Little 6 Click Score: 19   End of Session Equipment Utilized During Treatment: Rolling walker Nurse Communication:  Mobility status;Precautions;Weight bearing status  Activity Tolerance: Patient tolerated treatment well Patient left: in bed;with call bell/phone within reach;with bed alarm set;with family/visitor present  OT Visit Diagnosis: Other abnormalities of gait and mobility (R26.89);Muscle weakness (generalized) (M62.81);Dizziness and giddiness (R42)                Time: 5053-9767 OT Time Calculation (min): 20 min Charges:  OT General Charges $OT Visit: 1 Visit OT Evaluation $OT Eval Moderate Complexity: 1 Mod    Brithney Bensen A Eleasha Cataldo 11/24/2020, 9:52 AM

## 2020-11-25 LAB — CULTURE, BLOOD (ROUTINE X 2): Special Requests: ADEQUATE

## 2020-11-26 DIAGNOSIS — I4892 Unspecified atrial flutter: Secondary | ICD-10-CM | POA: Diagnosis not present

## 2020-11-26 DIAGNOSIS — N1831 Chronic kidney disease, stage 3a: Secondary | ICD-10-CM | POA: Diagnosis not present

## 2020-11-26 DIAGNOSIS — G309 Alzheimer's disease, unspecified: Secondary | ICD-10-CM | POA: Diagnosis not present

## 2020-11-26 DIAGNOSIS — I130 Hypertensive heart and chronic kidney disease with heart failure and stage 1 through stage 4 chronic kidney disease, or unspecified chronic kidney disease: Secondary | ICD-10-CM | POA: Diagnosis not present

## 2020-11-26 DIAGNOSIS — I959 Hypotension, unspecified: Secondary | ICD-10-CM | POA: Diagnosis not present

## 2020-11-26 DIAGNOSIS — I5032 Chronic diastolic (congestive) heart failure: Secondary | ICD-10-CM | POA: Diagnosis not present

## 2020-11-26 DIAGNOSIS — E860 Dehydration: Secondary | ICD-10-CM | POA: Diagnosis not present

## 2020-11-26 DIAGNOSIS — E1122 Type 2 diabetes mellitus with diabetic chronic kidney disease: Secondary | ICD-10-CM | POA: Diagnosis not present

## 2020-11-26 DIAGNOSIS — F0280 Dementia in other diseases classified elsewhere without behavioral disturbance: Secondary | ICD-10-CM | POA: Diagnosis not present

## 2020-11-27 DIAGNOSIS — E860 Dehydration: Secondary | ICD-10-CM | POA: Diagnosis not present

## 2020-11-27 DIAGNOSIS — F0280 Dementia in other diseases classified elsewhere without behavioral disturbance: Secondary | ICD-10-CM | POA: Diagnosis not present

## 2020-11-27 DIAGNOSIS — E1122 Type 2 diabetes mellitus with diabetic chronic kidney disease: Secondary | ICD-10-CM | POA: Diagnosis not present

## 2020-11-27 DIAGNOSIS — I4892 Unspecified atrial flutter: Secondary | ICD-10-CM | POA: Diagnosis not present

## 2020-11-27 DIAGNOSIS — N1831 Chronic kidney disease, stage 3a: Secondary | ICD-10-CM | POA: Diagnosis not present

## 2020-11-27 DIAGNOSIS — I5032 Chronic diastolic (congestive) heart failure: Secondary | ICD-10-CM | POA: Diagnosis not present

## 2020-11-27 DIAGNOSIS — I130 Hypertensive heart and chronic kidney disease with heart failure and stage 1 through stage 4 chronic kidney disease, or unspecified chronic kidney disease: Secondary | ICD-10-CM | POA: Diagnosis not present

## 2020-11-27 DIAGNOSIS — G309 Alzheimer's disease, unspecified: Secondary | ICD-10-CM | POA: Diagnosis not present

## 2020-11-27 DIAGNOSIS — I959 Hypotension, unspecified: Secondary | ICD-10-CM | POA: Diagnosis not present

## 2020-11-28 DIAGNOSIS — J449 Chronic obstructive pulmonary disease, unspecified: Secondary | ICD-10-CM | POA: Diagnosis not present

## 2020-11-28 DIAGNOSIS — F0390 Unspecified dementia without behavioral disturbance: Secondary | ICD-10-CM | POA: Diagnosis not present

## 2020-11-28 DIAGNOSIS — I5042 Chronic combined systolic (congestive) and diastolic (congestive) heart failure: Secondary | ICD-10-CM | POA: Diagnosis not present

## 2020-11-28 DIAGNOSIS — I2510 Atherosclerotic heart disease of native coronary artery without angina pectoris: Secondary | ICD-10-CM | POA: Diagnosis not present

## 2020-11-28 LAB — CULTURE, BLOOD (ROUTINE X 2)
Culture: NO GROWTH
Special Requests: ADEQUATE

## 2020-12-01 DIAGNOSIS — N1831 Chronic kidney disease, stage 3a: Secondary | ICD-10-CM | POA: Diagnosis not present

## 2020-12-01 DIAGNOSIS — I4892 Unspecified atrial flutter: Secondary | ICD-10-CM | POA: Diagnosis not present

## 2020-12-01 DIAGNOSIS — E1122 Type 2 diabetes mellitus with diabetic chronic kidney disease: Secondary | ICD-10-CM | POA: Diagnosis not present

## 2020-12-01 DIAGNOSIS — I5032 Chronic diastolic (congestive) heart failure: Secondary | ICD-10-CM | POA: Diagnosis not present

## 2020-12-01 DIAGNOSIS — G309 Alzheimer's disease, unspecified: Secondary | ICD-10-CM | POA: Diagnosis not present

## 2020-12-01 DIAGNOSIS — F0280 Dementia in other diseases classified elsewhere without behavioral disturbance: Secondary | ICD-10-CM | POA: Diagnosis not present

## 2020-12-01 DIAGNOSIS — I959 Hypotension, unspecified: Secondary | ICD-10-CM | POA: Diagnosis not present

## 2020-12-01 DIAGNOSIS — I130 Hypertensive heart and chronic kidney disease with heart failure and stage 1 through stage 4 chronic kidney disease, or unspecified chronic kidney disease: Secondary | ICD-10-CM | POA: Diagnosis not present

## 2020-12-01 DIAGNOSIS — E860 Dehydration: Secondary | ICD-10-CM | POA: Diagnosis not present

## 2020-12-02 DIAGNOSIS — N1831 Chronic kidney disease, stage 3a: Secondary | ICD-10-CM | POA: Diagnosis not present

## 2020-12-02 DIAGNOSIS — R778 Other specified abnormalities of plasma proteins: Secondary | ICD-10-CM | POA: Diagnosis not present

## 2020-12-02 DIAGNOSIS — G309 Alzheimer's disease, unspecified: Secondary | ICD-10-CM | POA: Diagnosis not present

## 2020-12-02 DIAGNOSIS — I4892 Unspecified atrial flutter: Secondary | ICD-10-CM | POA: Diagnosis not present

## 2020-12-02 DIAGNOSIS — R55 Syncope and collapse: Secondary | ICD-10-CM | POA: Diagnosis not present

## 2020-12-02 DIAGNOSIS — I130 Hypertensive heart and chronic kidney disease with heart failure and stage 1 through stage 4 chronic kidney disease, or unspecified chronic kidney disease: Secondary | ICD-10-CM | POA: Diagnosis not present

## 2020-12-02 DIAGNOSIS — E119 Type 2 diabetes mellitus without complications: Secondary | ICD-10-CM | POA: Diagnosis not present

## 2020-12-03 DIAGNOSIS — I130 Hypertensive heart and chronic kidney disease with heart failure and stage 1 through stage 4 chronic kidney disease, or unspecified chronic kidney disease: Secondary | ICD-10-CM | POA: Diagnosis not present

## 2020-12-03 DIAGNOSIS — I959 Hypotension, unspecified: Secondary | ICD-10-CM | POA: Diagnosis not present

## 2020-12-03 DIAGNOSIS — E1122 Type 2 diabetes mellitus with diabetic chronic kidney disease: Secondary | ICD-10-CM | POA: Diagnosis not present

## 2020-12-03 DIAGNOSIS — F0280 Dementia in other diseases classified elsewhere without behavioral disturbance: Secondary | ICD-10-CM | POA: Diagnosis not present

## 2020-12-03 DIAGNOSIS — I4892 Unspecified atrial flutter: Secondary | ICD-10-CM | POA: Diagnosis not present

## 2020-12-03 DIAGNOSIS — E860 Dehydration: Secondary | ICD-10-CM | POA: Diagnosis not present

## 2020-12-03 DIAGNOSIS — N1831 Chronic kidney disease, stage 3a: Secondary | ICD-10-CM | POA: Diagnosis not present

## 2020-12-03 DIAGNOSIS — G309 Alzheimer's disease, unspecified: Secondary | ICD-10-CM | POA: Diagnosis not present

## 2020-12-03 DIAGNOSIS — I5032 Chronic diastolic (congestive) heart failure: Secondary | ICD-10-CM | POA: Diagnosis not present

## 2020-12-04 ENCOUNTER — Ambulatory Visit: Payer: Medicare PPO | Admitting: Cardiology

## 2020-12-04 DIAGNOSIS — N1832 Chronic kidney disease, stage 3b: Secondary | ICD-10-CM | POA: Diagnosis not present

## 2020-12-04 DIAGNOSIS — E1151 Type 2 diabetes mellitus with diabetic peripheral angiopathy without gangrene: Secondary | ICD-10-CM | POA: Diagnosis not present

## 2020-12-04 DIAGNOSIS — E875 Hyperkalemia: Secondary | ICD-10-CM | POA: Diagnosis not present

## 2020-12-04 DIAGNOSIS — I130 Hypertensive heart and chronic kidney disease with heart failure and stage 1 through stage 4 chronic kidney disease, or unspecified chronic kidney disease: Secondary | ICD-10-CM | POA: Diagnosis not present

## 2020-12-04 DIAGNOSIS — I5033 Acute on chronic diastolic (congestive) heart failure: Secondary | ICD-10-CM | POA: Diagnosis not present

## 2020-12-04 DIAGNOSIS — I4892 Unspecified atrial flutter: Secondary | ICD-10-CM | POA: Diagnosis not present

## 2020-12-04 DIAGNOSIS — D72829 Elevated white blood cell count, unspecified: Secondary | ICD-10-CM | POA: Diagnosis not present

## 2020-12-04 DIAGNOSIS — R051 Acute cough: Secondary | ICD-10-CM | POA: Diagnosis not present

## 2020-12-06 ENCOUNTER — Encounter (HOSPITAL_COMMUNITY): Payer: Self-pay | Admitting: Emergency Medicine

## 2020-12-06 ENCOUNTER — Other Ambulatory Visit: Payer: Self-pay

## 2020-12-06 ENCOUNTER — Emergency Department (HOSPITAL_COMMUNITY): Payer: Medicare PPO

## 2020-12-06 ENCOUNTER — Emergency Department (HOSPITAL_COMMUNITY)
Admission: EM | Admit: 2020-12-06 | Discharge: 2020-12-06 | Disposition: A | Discharge status: Home / Self Care | Payer: Medicare PPO | Attending: Emergency Medicine | Admitting: Emergency Medicine

## 2020-12-06 DIAGNOSIS — Z7901 Long term (current) use of anticoagulants: Secondary | ICD-10-CM | POA: Diagnosis not present

## 2020-12-06 DIAGNOSIS — N1831 Chronic kidney disease, stage 3a: Secondary | ICD-10-CM | POA: Diagnosis not present

## 2020-12-06 DIAGNOSIS — F0280 Dementia in other diseases classified elsewhere without behavioral disturbance: Secondary | ICD-10-CM | POA: Diagnosis not present

## 2020-12-06 DIAGNOSIS — J432 Centrilobular emphysema: Secondary | ICD-10-CM | POA: Diagnosis not present

## 2020-12-06 DIAGNOSIS — Z79899 Other long term (current) drug therapy: Secondary | ICD-10-CM | POA: Diagnosis not present

## 2020-12-06 DIAGNOSIS — Z87891 Personal history of nicotine dependence: Secondary | ICD-10-CM | POA: Insufficient documentation

## 2020-12-06 DIAGNOSIS — R0789 Other chest pain: Secondary | ICD-10-CM | POA: Diagnosis not present

## 2020-12-06 DIAGNOSIS — R079 Chest pain, unspecified: Secondary | ICD-10-CM | POA: Diagnosis not present

## 2020-12-06 DIAGNOSIS — Z7984 Long term (current) use of oral hypoglycemic drugs: Secondary | ICD-10-CM | POA: Insufficient documentation

## 2020-12-06 DIAGNOSIS — I4892 Unspecified atrial flutter: Secondary | ICD-10-CM | POA: Diagnosis not present

## 2020-12-06 DIAGNOSIS — J45909 Unspecified asthma, uncomplicated: Secondary | ICD-10-CM | POA: Insufficient documentation

## 2020-12-06 DIAGNOSIS — I5033 Acute on chronic diastolic (congestive) heart failure: Secondary | ICD-10-CM | POA: Insufficient documentation

## 2020-12-06 DIAGNOSIS — G309 Alzheimer's disease, unspecified: Secondary | ICD-10-CM | POA: Diagnosis not present

## 2020-12-06 DIAGNOSIS — I130 Hypertensive heart and chronic kidney disease with heart failure and stage 1 through stage 4 chronic kidney disease, or unspecified chronic kidney disease: Secondary | ICD-10-CM | POA: Diagnosis not present

## 2020-12-06 DIAGNOSIS — Z96651 Presence of right artificial knee joint: Secondary | ICD-10-CM | POA: Diagnosis not present

## 2020-12-06 DIAGNOSIS — I959 Hypotension, unspecified: Secondary | ICD-10-CM | POA: Diagnosis not present

## 2020-12-06 DIAGNOSIS — J90 Pleural effusion, not elsewhere classified: Secondary | ICD-10-CM | POA: Diagnosis not present

## 2020-12-06 DIAGNOSIS — Z794 Long term (current) use of insulin: Secondary | ICD-10-CM | POA: Insufficient documentation

## 2020-12-06 DIAGNOSIS — I2510 Atherosclerotic heart disease of native coronary artery without angina pectoris: Secondary | ICD-10-CM | POA: Insufficient documentation

## 2020-12-06 DIAGNOSIS — J811 Chronic pulmonary edema: Secondary | ICD-10-CM | POA: Diagnosis not present

## 2020-12-06 DIAGNOSIS — J449 Chronic obstructive pulmonary disease, unspecified: Secondary | ICD-10-CM | POA: Diagnosis not present

## 2020-12-06 DIAGNOSIS — R072 Precordial pain: Secondary | ICD-10-CM | POA: Diagnosis not present

## 2020-12-06 DIAGNOSIS — I25118 Atherosclerotic heart disease of native coronary artery with other forms of angina pectoris: Secondary | ICD-10-CM | POA: Diagnosis not present

## 2020-12-06 DIAGNOSIS — I517 Cardiomegaly: Secondary | ICD-10-CM | POA: Diagnosis not present

## 2020-12-06 DIAGNOSIS — R0902 Hypoxemia: Secondary | ICD-10-CM | POA: Diagnosis not present

## 2020-12-06 DIAGNOSIS — I484 Atypical atrial flutter: Secondary | ICD-10-CM | POA: Diagnosis not present

## 2020-12-06 DIAGNOSIS — F0390 Unspecified dementia without behavioral disturbance: Secondary | ICD-10-CM

## 2020-12-06 LAB — BASIC METABOLIC PANEL
Anion gap: 8 (ref 5–15)
BUN: 35 mg/dL — ABNORMAL HIGH (ref 8–23)
CO2: 26 mmol/L (ref 22–32)
Calcium: 8.9 mg/dL (ref 8.9–10.3)
Chloride: 106 mmol/L (ref 98–111)
Creatinine, Ser: 2 mg/dL — ABNORMAL HIGH (ref 0.61–1.24)
GFR, Estimated: 31 mL/min — ABNORMAL LOW (ref >60)
Glucose, Bld: 130 mg/dL — ABNORMAL HIGH (ref 70–99)
Potassium: 4.6 mmol/L (ref 3.5–5.1)
Sodium: 140 mmol/L (ref 135–145)

## 2020-12-06 LAB — CBC
HCT: 29.2 % — ABNORMAL LOW (ref 39.0–52.0)
Hemoglobin: 9.2 g/dL — ABNORMAL LOW (ref 13.0–17.0)
MCH: 25.8 pg — ABNORMAL LOW (ref 26.0–34.0)
MCHC: 31.5 g/dL (ref 30.0–36.0)
MCV: 81.8 fL (ref 80.0–100.0)
Platelets: 218 10*3/uL (ref 150–400)
RBC: 3.57 MIL/uL — ABNORMAL LOW (ref 4.22–5.81)
RDW: 16.3 % — ABNORMAL HIGH (ref 11.5–15.5)
WBC: 10.9 10*3/uL — ABNORMAL HIGH (ref 4.0–10.5)
nRBC: 0 % (ref 0.0–0.2)

## 2020-12-06 LAB — TROPONIN I (HIGH SENSITIVITY): Troponin I (High Sensitivity): 21 ng/L — ABNORMAL HIGH (ref <18)

## 2020-12-06 LAB — PROTIME-INR
INR: 1.3 — ABNORMAL HIGH (ref 0.8–1.2)
Prothrombin Time: 15.8 seconds — ABNORMAL HIGH (ref 11.4–15.2)

## 2020-12-06 LAB — BRAIN NATRIURETIC PEPTIDE: B Natriuretic Peptide: 355.8 pg/mL — ABNORMAL HIGH (ref 0.0–100.0)

## 2020-12-06 MED ORDER — NITROGLYCERIN 0.4 MG SL SUBL: 0.4000 mg | SUBLINGUAL_TABLET | SUBLINGUAL | 0 refills | Status: DC | PRN

## 2020-12-06 MED ORDER — HYDRALAZINE HCL 50 MG PO TABS: ORAL_TABLET | ORAL | 1 refills | Status: AC

## 2020-12-06 NOTE — ED Provider Notes (Signed)
Emergency Medicine Provider Triage Evaluation Note  Kweli ASHWATH LASCH , a 87 y.o. male  was evaluated in triage.  Pt complains of left-sided chest pain that started this afternoon and then self resolved.  EMS was called.  Patient denies any shortness of breath or pain at this time but is endorsing left-sided chest pressure.  On chart review he has history of COPD, CHF, diabetes.  He has history of TIA and is anticoagulated on Eliquis.  Per EMS was in a flutter upon the time of arrival.  Review of Systems  Positive: Chest pain, chest pressure Negative: Shortness of breath, fevers, chills, syncope  Physical Exam  BP 113/65   Pulse 73   Temp (!) 97.5 F (36.4 C) (Oral)   Resp 16   SpO2 97%  Gen:   Awake, no distress   Resp:  Normal effort  MSK:   Moves extremities without difficulty  Other:  RRR no M/R/G.  Lungs with expiratory wheezes in all lung fields.  Medical Decision Making  Medically screening exam initiated at 2:26 PM.  Appropriate orders placed.  Javen S Melick was informed that the remainder of the evaluation will be completed by another provider, this initial triage assessment does not replace that evaluation, and the importance of remaining in the ED until their evaluation is complete.  This chart was dictated using voice recognition software, Dragon. Despite the best efforts of this provider to proofread and correct errors, errors may still occur which can change documentation meaning.    Emeline Darling, PA-C 12/06/20 1431    Regan Lemming, MD 12/06/20 1453

## 2020-12-06 NOTE — Progress Notes (Signed)
CARDIOLOGY ADMIT NOTE   Patient ID: [name] MRN: [id number] DOB/AGE: August 14, 1928 87 y.o.  Admit date: 12/06/2020 Primary Physician:  Leanna Battles, MD  Patient ID: [name], male    DOB: [birth year], 87 y.o.   MRN: [id number]  Chief Complaint  Patient presents with   Chest Pain   HPI:    [name]  is a 87 y.o. Asian [place] male with hypertension, diabetes mellitus with stage IIIb chronic kidney disease, coronary artery disease with remote stenting to his RCA in 2005, COPD, severe dementia, was admitted recently to the hospital on 11/22/2020 with an episode of syncope and was found to be hypotensive with blood pressure around 70 mmHg systolic and was also found to be bradycardic.  On further evaluation, he had brief episode of atrial flutter in the telemetry but spontaneously converted to sinus rhythm with IV diltiazem.  He was eventually discharged home to be followed in the outpatient basis 2 days later.  He was in his routine state of health this morning, but started complaining of chest pain, EMS was activated, he has been brought to the emergency room.  By the time he came to the emergency room he was asymptomatic without any recurrence of chest pain.  I am seeing him for evaluation of chest pain.  He has 24-hour home health coverage at home, history of stated that around 11:00 he complained of mild chest pain that started around 830, when she checked her blood pressure, was around 70 to 80 mmHg and saturations were down to like 80s.  She called PCPs office and hence was advised to come to the hospital.  EMS was called, by the time EMS got there, his vitals were all stable, he was able to use a walker to get to the EMS bed without any discomfort.  He has been chest pain-free since.  Patient's wife did give him 1 sublingual nitroglycerin however there were old.  Past Medical History:  Diagnosis Date   Anemia    Asthma    Benign prostatic hypertrophy    Bilateral  sensorineural hearing loss    CAD (coronary artery disease) 07/2003   a.  3.0 x 28 mm Taxus DES in the mid RCA in 2005   COPD (chronic obstructive pulmonary disease) (HCC)    Diverticulosis    DJD (degenerative joint disease)    DM (diabetes mellitus) (HCC)    Dyslipidemia    GERD (gastroesophageal reflux disease)    Hemorrhoids    Hiatal hernia    Hypertension    OSA (obstructive sleep apnea)    cpap use   Osteoarthritis    Overactive bladder    Rhinitis    Seasonal allergies    Shingles    Stroke [facility])    TIA (transient ischemic attack) 12/2006   Dr. Leonie Man   Past Surgical History:  Procedure Laterality Date   BACK SURGERY     lower back   Cataract surgery     1 eye done.   CORONARY ANGIOPLASTY WITH STENT PLACEMENT  07/2003   Est. EF of 65% with stent to the RCA, by Dr. Acie Fredrickson   CYSTOSCOPY WITH INSERTION OF UROLIFT N/A 02/25/2016   Procedure: CYSTOSCOPY WITH INSERTION OF UROLIFT x6;  Surgeon: Cleon Gustin, MD;  Location: [facility];  Service: Urology;  Laterality: N/A;  1 HOUR     I & D EXTREMITY Left 11/10/2019   Procedure: IRRIGATION AND DEBRIDEMENT EXTREMITY, left olecranon bursa;  Surgeon: Edmonia Lynch  D, MD;  Location: [place];  Service: Orthopedics;  Laterality: Left;   INNER EAR SURGERY     JOINT REPLACEMENT Right    L5 laminectomy  2004   Dr. Sherwood Gambler   right total knee  2006   Dr. Maureen Ralphs   Social History   Socioeconomic History   Marital status: Married    Spouse name: Not on file   Number of children: 4   Years of education: 16+   Highest education level: Not on file  Occupational History   Occupation: retired professor  Tobacco Use   Smoking status: Former    Packs/day: 1.00    Years: 43.00    Pack years: 43.00    Types: Cigarettes    Quit date: 05/02/1988    Years since quitting: 32.6   Smokeless tobacco: Never  Substance and Sexual Activity   Alcohol use: Yes    Comment: RARE-social   Drug use: No   Sexual activity: Not Currently  Other  Topics Concern   Not on file  Social History Narrative   Lives w/ wife   Caffeine use:      Social Determinants of Health   Financial Resource Strain: Not on file  Food Insecurity: Not on file  Transportation Needs: Not on file  Physical Activity: Not on file  Stress: Not on file  Social Connections: Not on file  Intimate Partner Violence: Not on file   Family History  Problem Relation Age of Onset   Lung disease Father    Asthma Father    Sudden death Mother        unknown causes   Lung disease Brother    Asthma Brother    Sudden death Brother 48       unknown causes   Sudden death Sister        unknown causes   Sudden death Sister        unknown causes    ROS  Review of Systems  Constitutional: Negative for decreased appetite and malaise/fatigue.  Cardiovascular:  Positive for chest pain (resolved). Negative for dyspnea on exertion, leg swelling, palpitations and syncope.  Respiratory:  Positive for shortness of breath (chronic). Negative for cough, sputum production and wheezing.   Skin: Negative.   Psychiatric/Behavioral:  Positive for depression and memory loss. The patient is nervous/anxious.   All other systems reviewed and are negative. Objective   Vitals with BMI 12/06/2020 11/24/2020 11/24/2020  Height - - -  Weight - - -  BMI - - -  Systolic 947 654 650  Diastolic 65 79 70  Pulse 73 67 63      Physical Exam Constitutional:      Appearance: He is normal weight.  HENT:     Head: Atraumatic.     Mouth/Throat:     Mouth: Mucous membranes are moist.  Eyes:     Extraocular Movements: Extraocular movements intact.  Neck:     Vascular: No carotid bruit or JVD.  Cardiovascular:     Rate and Rhythm: Normal rate and regular rhythm.     Pulses: Intact distal pulses.     Heart sounds: Normal heart sounds. No murmur heard.   No gallop.  Pulmonary:     Effort: Pulmonary effort is normal. No respiratory distress.     Breath sounds: Rales (Scattered rhonchi  at the bases) present.  Abdominal:     General: Bowel sounds are normal.     Palpations: Abdomen is soft.  Musculoskeletal:  General: Swelling (Trace ankle edema) present.     Cervical back: Neck supple.  Skin:    General: Skin is warm.  Neurological:     General: No focal deficit present.     Mental Status: Mental status is at baseline.  Psychiatric:        Mood and Affect: Mood normal.   Laboratory examination:   Recent Labs    11/22/20 1104 11/23/20 0326 11/24/20 0652  NA 136 143 141  K 4.1 4.0 3.7  CL 105 110 107  CO2 24 26 29   GLUCOSE 316* 245* 152*  BUN 40* 39* 28*  CREATININE 1.99* 1.81* 1.43*  CALCIUM 8.0* 8.5* 8.7*  GFRNONAA 32* 35* 47*   estimated creatinine clearance is 36.2 mL/min (A) (by C-G formula based on SCr of 1.43 mg/dL (H)).  CMP Latest Ref Rng & Units 11/24/2020 11/23/2020 11/22/2020  Glucose 70 - 99 mg/dL 152(H) 245(H) 316(H)  BUN 8 - 23 mg/dL 28(H) 39(H) 40(H)  Creatinine 0.61 - 1.24 mg/dL 1.43(H) 1.81(H) 1.99(H)  Sodium 135 - 145 mmol/L 141 143 136  Potassium 3.5 - 5.1 mmol/L 3.7 4.0 4.1  Chloride 98 - 111 mmol/L 107 110 105  CO2 22 - 32 mmol/L 29 26 24   Calcium 8.9 - 10.3 mg/dL 8.7(L) 8.5(L) 8.0(L)  Total Protein 6.5 - 8.1 g/dL - - 4.8(L)  Total Bilirubin 0.3 - 1.2 mg/dL - - 0.4  Alkaline Phos 38 - 126 U/L - - 69  AST 15 - 41 U/L - - 13(L)  ALT 0 - 44 U/L - - 15   CBC Latest Ref Rng & Units 12/06/2020 11/23/2020 11/22/2020  WBC 4.0 - 10.5 K/uL 10.9(H) 9.8 13.9(H)  Hemoglobin 13.0 - 17.0 g/dL 9.2(L) 8.9(L) 8.4(L)  Hematocrit 39.0 - 52.0 % 29.2(L) 27.8(L) 26.0(L)  Platelets 150 - 400 K/uL 218 169 160   Lipid Panel     Component Value Date/Time   CHOL 109 01/03/2012 1305   TRIG 44.0 01/03/2012 1305   HDL 60.20 01/03/2012 1305   CHOLHDL 2 01/03/2012 1305   VLDL 8.8 01/03/2012 1305   LDLCALC 40 01/03/2012 1305   HEMOGLOBIN A1C Lab Results  Component Value Date   HGBA1C 10.4 (H) 11/22/2020   MPG 252 11/22/2020   TSH Recent Labs     11/13/20 1145 11/22/20 1550  TSH 5.912* 2.839   BNP (last 3 results) Recent Labs    11/22/20 1141  BNP 151.3*   Cardiac Panel (last 3 results) Recent Labs    12/06/20 1432  TROPONINIHS 21*   No significant change in delta from prior admission. Medications and allergies   Allergies  Allergen Reactions   Ciprofloxacin Other (See Comments)    unk   Ciprofloxacin Hcl Other (See Comments)    Unknown reaction   Penicillins     Has patient had a PCN reaction causing immediate rash, facial/tongue/throat swelling, SOB or lightheadedness with hypotension: Unknown Has patient had a PCN reaction causing severe rash involving mucus membranes or skin necrosis: Unknown Has patient had a PCN reaction that required hospitalization Unknown Has patient had a PCN reaction occurring within the last 10 years: Unknown If all of the above answers are "NO", then may proceed with Cephalosporin use. Tolerate cephalosporins   Penicillins Other (See Comments)    unk   Sulfa Antibiotics Hives and Rash   Sulfamethoxazole Rash   Sulfonamide Derivatives Rash     Current Outpatient Medications  Medication Instructions   acetaminophen (TYLENOL) 650 mg, Oral, Every 6  hours PRN   albuterol (PROVENTIL) 2.5 mg, Nebulization, See admin instructions, 2.5mg  twice daily<BR>And 2.5mg  daily as needed for shortness of breath/wheezing   apixaban (ELIQUIS) 2.5 mg, Oral, 2 times daily   atorvastatin (LIPITOR) 20 mg, Oral, Daily   brimonidine (ALPHAGAN) 0.15 % ophthalmic solution 1 drop, Left Eye, 2 times daily   diltiazem (CARDIZEM CD) 120 mg, Oral, Daily   divalproex (DEPAKOTE) 125 mg, Oral, Daily at bedtime   donepezil (ARICEPT) 10 mg, Oral, Daily with supper   escitalopram (LEXAPRO) 5 mg, Oral, Daily with supper   finasteride (PROSCAR) 5 mg, Oral, Every evening   furosemide (LASIX) 40 mg, Oral, Daily PRN   hydrALAZINE (APRESOLINE) 50 mg, Oral, 3 times daily PRN   insulin lispro (HUMALOG) 2-5 Units,  Subcutaneous, See admin instructions, 2-5 units per sliding scale   ipratropium-albuterol (DUONEB) 0.5-2.5 (3) MG/3ML SOLN 3 mLs, 3 times daily   latanoprost (XALATAN) 0.005 % ophthalmic solution 1 drop, Both Eyes, Daily at bedtime   loratadine (CLARITIN) 10 mg, Oral, Daily   memantine (NAMENDA) 10 mg, Oral, 2 times daily   montelukast (SINGULAIR) 10 mg, Oral, Daily   Multiple Vitamin (MULTIVITAMIN WITH MINERALS) TABS tablet 1 tablet, Oral, Daily   Myrbetriq 50 mg, Oral, Every evening   nitroGLYCERIN (NITROSTAT) 0.4 mg, Sublingual, Every 5 min PRN   pantoprazole (PROTONIX) 40 mg, Oral, 2 times daily   predniSONE (DELTASONE) 7.5 mg, Oral, Daily with breakfast   sitaGLIPtin-metformin (JANUMET) 50-1000 MG tablet 1 tablet, 2 times daily with meals   TRELEGY ELLIPTA 100-62.5-25 MCG/INH AEPB 1 puff, Inhalation, Daily   vitamin C (ASCORBIC ACID) 500 mg, Oral, Daily with supper   Vitamin D 2,000 Units, Oral, Daily with supper    Radiology:  DG Chest 2 View  Result Date: 12/06/2020 CLINICAL DATA:  Chest pain EXAM: CHEST - 2 VIEW COMPARISON:  11/22/2020 FINDINGS: Lung volumes are small, but are symmetric. There is mild bilateral perihilar and lower lung zone interstitial pulmonary infiltrate most in keeping with mild pulmonary edema, possibly cardiogenic in nature. Small bilateral pleural effusions are present. No pneumothorax. Cardiac size is mildly enlarged. No acute bone abnormality. IMPRESSION: Mild cardiogenic failure.  Stable cardiomegaly. Electronically Signed   By: Fidela Salisbury MD   On: 12/06/2020 14:58     Cardiac Studies:   Echocardiogram 11/23/2020:  1. Left ventricular ejection fraction, by estimation, is 60 to 65%. The  left ventricle has normal function. Left ventricular endocardial border  not optimally defined to evaluate regional wall motion. Left ventricular  diastolic parameters are consistent  with Grade II diastolic dysfunction (pseudonormalization). Elevated left   ventricular end-diastolic pressure.   2. Right ventricular systolic function is normal. The right ventricular  size is normal.   3. Left atrial size was mildly dilated.   4. Mild degenerative (senile) changes. The mitral valve is degenerative.  Mild mitral valve regurgitation. No evidence of mitral stenosis. The mean  mitral valve gradient is 4.5 mmHg.   5. The aortic valve is tricuspid. There is mild calcification of the  aortic valve. Aortic valve regurgitation is not visualized. Mild aortic  valve sclerosis is present, with no evidence of aortic valve stenosis.   6. The inferior vena cava is normal in size with <50% respiratory  variability, suggesting right atrial pressure of 8 mmHg.   EKG 11/22/2020: Sinus bradycardia 50 bpm with occasional PAC  EKG 12/06/2020: Atrial flutter, atypical with 4: 1 conduction, ventricular rate 76 bpm, normal axis, poor R wave progression.  Nonspecific T abnormality.  Assessment  [name]  is a 87 y.o. Asian [place] male with hypertension, diabetes mellitus with stage IIIb chronic kidney disease, coronary artery disease with remote stenting to his RCA in 2005, COPD, severe dementia, was admitted recently to the hospital on 11/22/2020 with an episode of syncope and was found to be hypotensive with blood pressure around 70 mmHg systolic and was also found to be bradycardic.  On further evaluation, he had brief episode of atrial flutter in the telemetry but spontaneously converted to sinus rhythm with IV diltiazem.  He was eventually discharged home to be followed in the outpatient basis 2 days later.  He had an episode of chest pain that started around 8:30 AM but did not mention it until his caregiver got there around 11 AM, she noticed his blood pressure to be low around 42V to 95G systolic and saturations were low as well although he did not appear to be in any acute distress.  PCP office was called and they were advised to come to the ED.   1.  1 episode  of chest pain that has completely resolved since he presented to the emergency room, EKG does not reveal any acute abnormality.  He is in no acute distress, lungs are clear. Suspect his symptoms may have been related to recurrence of atrial flutter. 2.  Primary hypertension 3.  Dementia  4.  Hypertension, patient's caregiver noticed blood pressure to be low at home however EMS blood pressure was normal. 5.  COPD with chronic hypoxemia, caregiver noticed saturations to be in the 80s to 90s although no acute distress. Suspect both blood pressure recording and pulse oximeter may have been related to an earlier in view of new onset atrial flutter. 6. Acute on chronic diastolic heart failure and minimally elevated Troponin secondary troponin leak.  Recommendations:   Patient will be discharged home today if 1st set of cardiac troponin is negative with outpatient follow-up.  I will prescribe sublingual nitroglycerin for discharge.  Will probably place a extended EKG monitoring to evaluate for paroxysmal episodes and to evaluate for any episode of AV block.  He has not had any syncope since recent hospitalization with syncope and dehydration a week ago.  Will also change hydralazine from taking it on a as needed basis with systolic blood pressure >387FIEP to >150 mmHg.  He has taken furosemide yesterday and day before and also this morning due to worsening leg edema, he will continue this for 1 more day, leg edema is essentially resolved.  With regard to COPD, continue nebulizer use and inhalers.  Advised the patient's family to call us if she has any recurrence of symptoms.  I spent 60 minutes total, including 40 minutes of face-to-face encounter, evaluation of prior medical records, coordination of care with the emergency room physician Dr. Fredia Sorrow.    Adrian Prows, MD, [facility] 12/06/2020, 4:01 PM Office: [phone] Fax: [phone] Pager: [phone]

## 2020-12-06 NOTE — ED Notes (Addendum)
DC instructions reviewed with pt. Pt verbalized understanding.  PT DC.  

## 2020-12-06 NOTE — ED Provider Notes (Signed)
[facility] EMERGENCY DEPARTMENT Provider Note   CSN: [id number] Arrival date & time: 12/06/20  1423     History Chief Complaint  Patient presents with   Chest Pain    [name] is a 87 y.o. male.  Patient is followed by Dr. Einar Gip.  Patient brought in by EMS from home.  Patient was complaining of chest pain started around 1230.  Nursing notes stated it resolved.  The patient's caregiver who is here with him states that he was complaining of chest pain after arriving here.  He denies it now.  EMS gave him 325 mg of aspirin and 1 sublingual nitroglycerin.  Patient is a DNR.  Patient with recent admission July 24 for weakness and syncope.  Noted to have new onset atrial flutter at that time.  Patient is on Eliquis and diltiazem for that.  Cardiac monitor here shows atrial flutter with a 2-1.  Is rate controlled.  Past medical history significant for dementia type 2 diabetes acute on chronic diastolic congestive heart failure, acute renal failure, type 2 diabetes coronary artery disease.  Also COPD.  Caregiver states the patient had all his morning medicines to include the Eliquis and his Cardizem.      Past Medical History:  Diagnosis Date   Anemia    Asthma    Benign prostatic hypertrophy    Bilateral sensorineural hearing loss    CAD (coronary artery disease) 07/2003   a.  3.0 x 28 mm Taxus DES in the mid RCA in 2005   COPD (chronic obstructive pulmonary disease) (HCC)    Diverticulosis    DJD (degenerative joint disease)    DM (diabetes mellitus) (HCC)    Dyslipidemia    GERD (gastroesophageal reflux disease)    Hemorrhoids    Hiatal hernia    Hypertension    OSA (obstructive sleep apnea)    cpap use   Osteoarthritis    Overactive bladder    Rhinitis    Seasonal allergies    Shingles    Stroke [facility])    TIA (transient ischemic attack) 12/2006   Dr. Leonie Man    Patient Active Problem List   Diagnosis Date Noted   Type 2 diabetes mellitus with  peripheral angiopathy ([place]) 11/22/2020   Syncope 11/22/2020   Acute on chronic diastolic CHF (congestive heart failure) ([place]) 11/06/2019   CHF (congestive heart failure) ([place]) 11/06/2019   Acute renal failure (HCC)    Hyperkalemia    Chronic kidney disease, stage 3a ([place]) 06/04/2019   Fall 05/07/2017   Dementia ([place]) 04/22/2015   Alzheimer's disease ([place]) 09/26/2014   Community acquired pneumonia 02/24/2013    Class: Acute   Iatrogenic adrenal insufficiency ([place]) 02/24/2013    Class: Chronic   Type II or unspecified type diabetes mellitus with neurological manifestations, not stated as uncontrolled(250.60) 01/04/2013    Class: Chronic   Coronary artery disease 01/03/2012   Gastro-esophageal reflux disease without esophagitis 03/31/2009   Iron deficiency anemia 03/31/2009   Mixed hyperlipidemia 03/31/2009   OSA (obstructive sleep apnea) 03/13/2007   COPD (chronic obstructive pulmonary disease) ([place]) 03/13/2007    Past Surgical History:  Procedure Laterality Date   BACK SURGERY     lower back   Cataract surgery     1 eye done.   CORONARY ANGIOPLASTY WITH STENT PLACEMENT  07/2003   Est. EF of 65% with stent to the RCA, by Dr. Acie Fredrickson   CYSTOSCOPY WITH INSERTION OF UROLIFT N/A 02/25/2016   Procedure: CYSTOSCOPY  WITH INSERTION OF UROLIFT x6;  Surgeon: Cleon Gustin, MD;  Location: [facility];  Service: Urology;  Laterality: N/A;  1 HOUR     I & D EXTREMITY Left 11/10/2019   Procedure: IRRIGATION AND DEBRIDEMENT EXTREMITY, left olecranon bursa;  Surgeon: Renette Butters, MD;  Location: [facility];  Service: Orthopedics;  Laterality: Left;   INNER EAR SURGERY     JOINT REPLACEMENT Right    L5 laminectomy  2004   Dr. Sherwood Gambler   right total knee  2006   Dr. Maureen Ralphs       Family History  Problem Relation Age of Onset   Lung disease Father    Asthma Father    Sudden death Mother        unknown causes   Lung disease Brother    Asthma Brother    Sudden death Brother 50        unknown causes   Sudden death Sister        unknown causes   Sudden death Sister        unknown causes    Social History   Tobacco Use   Smoking status: Former    Packs/day: 1.00    Years: 43.00    Pack years: 43.00    Types: Cigarettes    Quit date: 05/02/1988    Years since quitting: 32.6   Smokeless tobacco: Never  Substance Use Topics   Alcohol use: Yes    Comment: RARE-social   Drug use: No    Home Medications Prior to Admission medications   Medication Sig Start Date End Date Taking? Authorizing Provider  acetaminophen (TYLENOL) 325 MG tablet Take 2 tablets (650 mg total) by mouth every 6 (six) hours as needed for moderate pain. 11/13/19   Arrien, Jimmy Picket, MD  albuterol (PROVENTIL) (2.5 MG/3ML) 0.083% nebulizer solution Take 2.5 mg by nebulization See admin instructions. 2.5mg  twice daily And 2.5mg  daily as needed for shortness of breath/wheezing    [provider]  apixaban (ELIQUIS) 2.5 MG TABS tablet Take 1 tablet (2.5 mg total) by mouth 2 (two) times daily. 11/24/20 12/24/20  Antonieta Pert, MD  atorvastatin (LIPITOR) 20 MG tablet Take 20 mg by mouth daily.     [provider]  brimonidine (ALPHAGAN) 0.15 % ophthalmic solution Place 1 drop into the left eye 2 (two) times daily. 10/29/19   [provider]  Cholecalciferol (VITAMIN D) 2000 units CAPS Take 2,000 Units by mouth daily with supper.     [provider]  diltiazem (CARDIZEM CD) 120 MG 24 hr capsule Take 1 capsule (120 mg total) by mouth daily. 11/25/20 12/25/20  Antonieta Pert, MD  divalproex (DEPAKOTE) 125 MG DR tablet Take 125 mg by mouth at bedtime. 07/24/20   [provider]  donepezil (ARICEPT) 10 MG tablet Take 10 mg by mouth daily with supper. 04/08/17   [provider]  escitalopram (LEXAPRO) 5 MG tablet Take 5 mg by mouth daily with supper. 02/13/17   [provider]  finasteride (PROSCAR) 5 MG tablet Take 5 mg by mouth every evening. 08/22/20    [provider]  furosemide (LASIX) 40 MG tablet Take 1 tablet (40 mg total) by mouth daily as needed for fluid or edema (and shortness of breath). 11/24/20   Antonieta Pert, MD  hydrALAZINE (APRESOLINE) 50 MG tablet Take 1 tablet (50 mg total) by mouth 3 (three) times daily as needed for up to 20 doses (for sbp > 140). 11/24/20  Antonieta Pert, MD  insulin lispro (HUMALOG) 100 UNIT/ML KiwkPen Inject 2-5 Units into the skin See admin instructions. 2-5 units per sliding scale    [provider]  ipratropium-albuterol (DUONEB) 0.5-2.5 (3) MG/3ML SOLN Take 3 mLs by nebulization in the morning, at noon, and at bedtime. Patient not taking: Reported on 11/23/2020 08/12/20   [provider]  latanoprost (XALATAN) 0.005 % ophthalmic solution Place 1 drop into both eyes at bedtime. 06/20/19   [provider]  loratadine (CLARITIN) 10 MG tablet Take 10 mg by mouth daily.    [provider]  memantine (NAMENDA) 10 MG tablet Take 10 mg by mouth 2 (two) times daily. 03/18/17   [provider]  montelukast (SINGULAIR) 10 MG tablet Take 10 mg by mouth daily. 11/07/20   [provider]  Multiple Vitamin (MULTIVITAMIN WITH MINERALS) TABS tablet Take 1 tablet by mouth daily.    [provider]  MYRBETRIQ 50 MG TB24 tablet Take 50 mg by mouth every evening. 08/31/20   [provider]  nitroGLYCERIN (NITROSTAT) 0.4 MG SL tablet Place 0.4 mg under the tongue every 5 (five) minutes as needed for chest pain.     [provider]  pantoprazole (PROTONIX) 40 MG tablet Take 40 mg by mouth 2 (two) times daily. 11/07/20   [provider]  predniSONE (DELTASONE) 5 MG tablet Take 7.5 mg by mouth daily with breakfast.     [provider]  sitaGLIPtin-metformin (JANUMET) 50-1000 MG tablet Take 1 tablet by mouth 2 (two) times daily with a meal.      [provider]  TRELEGY ELLIPTA 100-62.5-25 MCG/INH AEPB Inhale 1 puff into the lungs  daily. 10/27/20   [provider]  vitamin C (ASCORBIC ACID) 500 MG tablet Take 500 mg by mouth daily with supper.     [provider]    Allergies    Ciprofloxacin, Ciprofloxacin hcl, Penicillins, Penicillins, Sulfa antibiotics, Sulfamethoxazole, and Sulfonamide derivatives  Review of Systems   Review of Systems  Unable to perform ROS: Dementia   Physical Exam Updated Vital Signs BP 114/68   Pulse 68   Temp (!) 97.5 F (36.4 C) (Oral)   Resp 20   SpO2 97%   Physical Exam Vitals and nursing note reviewed.  Constitutional:      General: He is not in acute distress.    Appearance: He is well-developed.  HENT:     Head: Normocephalic and atraumatic.  Eyes:     Extraocular Movements: Extraocular movements intact.     Conjunctiva/sclera: Conjunctivae normal.     Pupils: Pupils are equal, round, and reactive to light.  Cardiovascular:     Rate and Rhythm: Normal rate. Rhythm irregular.     Heart sounds: No murmur heard. Pulmonary:     Effort: Pulmonary effort is normal. No respiratory distress.     Breath sounds: Normal breath sounds.  Chest:     Chest wall: No tenderness.  Abdominal:     Palpations: Abdomen is soft.     Tenderness: There is no abdominal tenderness.  Musculoskeletal:        General: No swelling.     Cervical back: Neck supple.  Skin:    General: Skin is warm and dry.  Neurological:     Mental Status: He is alert. Mental status is at baseline.     Comments: [facility] neuro exam baseline mental status baseline as per family.    ED Results / Procedures / Treatments  Labs (all labs ordered are listed, but only abnormal results are displayed) Labs Reviewed  BASIC METABOLIC PANEL - Abnormal; Notable for the following components:      Result Value   Glucose, Bld 130 (*)    BUN 35 (*)    Creatinine, Ser 2.00 (*)    GFR, Estimated 31 (*)    All other components within normal limits  CBC - Abnormal; Notable for the following components:    WBC 10.9 (*)    RBC 3.57 (*)    Hemoglobin 9.2 (*)    HCT 29.2 (*)    MCH 25.8 (*)    RDW 16.3 (*)    All other components within normal limits  BRAIN NATRIURETIC PEPTIDE - Abnormal; Notable for the following components:   B Natriuretic Peptide 355.8 (*)    All other components within normal limits  PROTIME-INR - Abnormal; Notable for the following components:   Prothrombin Time 15.8 (*)    INR 1.3 (*)    All other components within normal limits  TROPONIN I (HIGH SENSITIVITY) - Abnormal; Notable for the following components:   Troponin I (High Sensitivity) 21 (*)    All other components within normal limits  TROPONIN I (HIGH SENSITIVITY)    EKG EKG Interpretation  Date/Time:  Sunday December 06 2020 14:35:26 EDT Ventricular Rate:  73 PR Interval:    QRS Duration: 74 QT Interval:  418 QTC Calculation: 460 R Axis:   12 Text Interpretation: Atrial flutter with 4:1 A-V conduction Possible Inferior infarct , age undetermined Cannot rule out Anterior infarct , age undetermined Abnormal ECG New since previous tracing Confirmed by Fredia Sorrow [phone]) on 12/06/2020 3:47:59 PM  Radiology DG Chest 2 View  Result Date: 12/06/2020 CLINICAL DATA:  Chest pain EXAM: CHEST - 2 VIEW COMPARISON:  11/22/2020 FINDINGS: Lung volumes are small, but are symmetric. There is mild bilateral perihilar and lower lung zone interstitial pulmonary infiltrate most in keeping with mild pulmonary edema, possibly cardiogenic in nature. Small bilateral pleural effusions are present. No pneumothorax. Cardiac size is mildly enlarged. No acute bone abnormality. IMPRESSION: Mild cardiogenic failure.  Stable cardiomegaly. Electronically Signed   By: Fidela Salisbury MD   On: 12/06/2020 14:58    Procedures Procedures   Medications Ordered in ED Medications - No data to display  ED Course  I have reviewed the triage vital signs and the nursing notes.  Pertinent labs & imaging results that were available during my  care of the patient were reviewed by me and considered in my medical decision making (see chart for details).    MDM Rules/Calculators/A&P                          Patient with dementia.  So hard to tell if the chest pain has resolved.  He is a DNR.  Dr. Einar Gip came in to see the patient.  Evaluated him.  He is comfortable with the initial troponin of 21.  Has had elevated troponins before.  EKG shows atrial flutter but overall rate control 2-1.  Dr. Einar Gip will do event monitoring tomorrow.  He will arrange that in the office.  He gave clearance for patient to be discharged home.  Recommended renewing his nitroglycerin.  Making sure that his hydralazine is only taken if the blood pressure is greater than 150.    Final Clinical Impression(s) / ED Diagnoses Final diagnoses:  Precordial pain  Dementia without behavioral disturbance, unspecified dementia type (  [place])  Atrial flutter, unspecified type [facility])    Rx / DC Orders ED Discharge Orders     None        Fredia Sorrow, MD 12/06/20 1645

## 2020-12-06 NOTE — ED Triage Notes (Signed)
Pt to triage via [facility] from home.  Reports chest pain that started at 12:30pm and has resolved.  Denies sob, nausea, and vomiting.  Took ASA 325mg  and 1 NTG.  20g L AC.  A flutter.

## 2020-12-06 NOTE — Discharge Instructions (Addendum)
You have been cleared for discharge home by Dr. Einar Gip.  We have renewed your nitroglycerin tablets so that you have a fresh batch.  Also have changed your hydralazine prescription to only be given for blood pressures only for systolic blood pressures greater than 150.  Dr. Einar Gip will be contacting you to arrange some event monitoring.  Return for any new or worse symptoms.

## 2020-12-08 ENCOUNTER — Telehealth: Payer: Self-pay

## 2020-12-08 DIAGNOSIS — F0280 Dementia in other diseases classified elsewhere without behavioral disturbance: Secondary | ICD-10-CM | POA: Diagnosis not present

## 2020-12-08 DIAGNOSIS — N1831 Chronic kidney disease, stage 3a: Secondary | ICD-10-CM | POA: Diagnosis not present

## 2020-12-08 DIAGNOSIS — G309 Alzheimer's disease, unspecified: Secondary | ICD-10-CM | POA: Diagnosis not present

## 2020-12-08 DIAGNOSIS — E1122 Type 2 diabetes mellitus with diabetic chronic kidney disease: Secondary | ICD-10-CM | POA: Diagnosis not present

## 2020-12-08 DIAGNOSIS — I5032 Chronic diastolic (congestive) heart failure: Secondary | ICD-10-CM | POA: Diagnosis not present

## 2020-12-08 DIAGNOSIS — E860 Dehydration: Secondary | ICD-10-CM | POA: Diagnosis not present

## 2020-12-08 DIAGNOSIS — I4892 Unspecified atrial flutter: Secondary | ICD-10-CM | POA: Diagnosis not present

## 2020-12-08 DIAGNOSIS — I959 Hypotension, unspecified: Secondary | ICD-10-CM | POA: Diagnosis not present

## 2020-12-08 DIAGNOSIS — I130 Hypertensive heart and chronic kidney disease with heart failure and stage 1 through stage 4 chronic kidney disease, or unspecified chronic kidney disease: Secondary | ICD-10-CM | POA: Diagnosis not present

## 2020-12-08 DIAGNOSIS — I484 Atypical atrial flutter: Secondary | ICD-10-CM

## 2020-12-08 MED ORDER — METOPROLOL TARTRATE 25 MG PO TABS: 25.0000 mg | ORAL_TABLET | Freq: Two times a day (BID) | ORAL | 2 refills | Status: DC

## 2020-12-08 NOTE — Telephone Encounter (Signed)
Patient care giver called and stated that the pts HR has been high today. Pt has taken all of his medication today. Pt has not been dizzy and has not had any chest pain. High BP and HR for today are below.  9:20 am- BP 147/87 HR 138 11:25 am- BP 120/78 HR 141 1:30 pm- BP 124/78 HR 141

## 2020-12-08 NOTE — Telephone Encounter (Signed)
Called and spoke to pts care giver. She voiced understanding.

## 2020-12-09 ENCOUNTER — Telehealth: Payer: Self-pay | Admitting: Cardiology

## 2020-12-09 ENCOUNTER — Telehealth: Payer: Self-pay

## 2020-12-09 NOTE — Telephone Encounter (Signed)
Pt's sitter is calling with questions regarding new rx from yesterday.

## 2020-12-09 NOTE — Telephone Encounter (Signed)
Pt is aware and he will be coming in tomorrow at 2:30pm.

## 2020-12-09 NOTE — Telephone Encounter (Signed)
Called and spoke to care giver. Sent a message to Dr. Einar Gip regarding this issue.

## 2020-12-09 NOTE — Telephone Encounter (Signed)
Pts caregiver called back with complaints of the pt feeling SOB since last night. They are unsure if this is because of the new medication. He has not taken the metoprolol this morning. However, after asking a few questions she stated that the pt did have swelling in his feet and around his eyes. They will be giving him his lasix to see if that helps. Bp readings are below.  Last night:  3:30pm- 138/76 (No HR noted) 9:40pm- 156/67 HR 65  This morning: 8:55am- 147/104 HR 129 ox 89

## 2020-12-09 NOTE — Telephone Encounter (Signed)
He needs to be seen, he has virtual visit with MP, change that to OV and also we can place Zio when he is here

## 2020-12-10 ENCOUNTER — Inpatient Hospital Stay: Payer: Medicare PPO

## 2020-12-10 ENCOUNTER — Ambulatory Visit: Payer: Medicare PPO | Admitting: Cardiology

## 2020-12-10 ENCOUNTER — Encounter: Payer: Self-pay | Admitting: Cardiology

## 2020-12-10 ENCOUNTER — Other Ambulatory Visit: Payer: Self-pay

## 2020-12-10 VITALS — BP 143/66 | HR 68 | Temp 98.0°F | Resp 16 | Ht 70.0 in | Wt 160.0 lb

## 2020-12-10 DIAGNOSIS — I5033 Acute on chronic diastolic (congestive) heart failure: Secondary | ICD-10-CM

## 2020-12-10 DIAGNOSIS — I483 Typical atrial flutter: Secondary | ICD-10-CM | POA: Insufficient documentation

## 2020-12-10 NOTE — Progress Notes (Signed)
Follow up visit  Subjective:   [name], male    DOB: [birth year], 87 y.o.   MRN: [id number]    HPI  Chief Complaint  Patient presents with   Shortness of Breath   Atypical atrial flutter     87 y/o [place] male with hypertension, type 2 DM, CAD, dementia, admitted with syncope, paroxysmal atrial flutter  Patient recently had 2 ED visits.  The one in July 2022 was thought to be related to possible vasovagal episode.  However, while hospitalized, he had atrial flutter with RVR.  This was managed successfully with IV diltiazem transition to p.o. diltiazem 120 mg daily.  Patient had another ED visit in August 2022 with chest pain, which resolved soon after presentation.  Trop HS was only mildly elevated at 21. BNP was elevated at 355. He was managed conservatively.  Since then, he has had episodes of atrial flutter of up to 140 bpm.  Patient has not had any recurrence of chest pain.  Patient is here today with his wife, and son-who is a physician.  Patient has had few nights of restlessness shortness of breath.  He was also noted to be agitated after receiving a dose of metoprolol tartrate 25 mg few days ago.  On home monitoring of his blood pressure and heart rate, he has had 1 episode of heart rate at 44 bpm, and a few episodes upwards of 100 bpm.  Reviewed labs from recent ED visit, which showed BNP elevation at 355, high sensitive troponin elevation at 21, creatinine elevation at 2.0.   Current Outpatient Medications on File Prior to Visit  Medication Sig Dispense Refill   acetaminophen (TYLENOL) 325 MG tablet Take 2 tablets (650 mg total) by mouth every 6 (six) hours as needed for moderate pain. 40 tablet 0   apixaban (ELIQUIS) 2.5 MG TABS tablet Take 1 tablet (2.5 mg total) by mouth 2 (two) times daily. 60 tablet 0   atorvastatin (LIPITOR) 20 MG tablet Take 20 mg by mouth daily.      brimonidine (ALPHAGAN) 0.15 % ophthalmic solution Place 1 drop into the left eye 2 (two) times  daily.     Cholecalciferol (VITAMIN D) 2000 units CAPS Take 2,000 Units by mouth daily with supper.      diltiazem (CARDIZEM CD) 120 MG 24 hr capsule Take 1 capsule (120 mg total) by mouth daily. (Patient taking differently: Take 180 mg by mouth daily.) 30 capsule 0   diltiazem (CARDIZEM) 30 MG tablet Take 1 tablet by mouth as needed.     divalproex (DEPAKOTE) 125 MG DR tablet Take 125 mg by mouth at bedtime.     donepezil (ARICEPT) 10 MG tablet Take 10 mg by mouth daily with supper.  3   escitalopram (LEXAPRO) 5 MG tablet Take 5 mg by mouth daily with supper.  2   finasteride (PROSCAR) 5 MG tablet Take 5 mg by mouth every evening.     furosemide (LASIX) 40 MG tablet Take 1 tablet (40 mg total) by mouth daily as needed for fluid or edema (and shortness of breath). 30 tablet    hydrALAZINE (APRESOLINE) 50 MG tablet 1 tablet p.o. every 8 hours as needed for blood pressure systolic greater than 967.  If blood pressure is less than 893 systolic do not give the hydralazine. 20 tablet 1   insulin lispro (HUMALOG) 100 UNIT/ML KiwkPen Inject 2-5 Units into the skin See admin instructions. 2-5 units per sliding scale     latanoprost (  XALATAN) 0.005 % ophthalmic solution Place 1 drop into both eyes at bedtime.     loratadine (CLARITIN) 10 MG tablet Take 10 mg by mouth daily.     memantine (NAMENDA) 10 MG tablet Take 10 mg by mouth 2 (two) times daily.  3   montelukast (SINGULAIR) 10 MG tablet Take 10 mg by mouth daily.     Multiple Vitamin (MULTIVITAMIN WITH MINERALS) TABS tablet Take 1 tablet by mouth daily.     MYRBETRIQ 50 MG TB24 tablet Take 50 mg by mouth every evening.     nitroGLYCERIN (NITROSTAT) 0.4 MG SL tablet Place 0.4 mg under the tongue every 5 (five) minutes as needed for chest pain.      pantoprazole (PROTONIX) 40 MG tablet Take 40 mg by mouth 2 (two) times daily.     predniSONE (DELTASONE) 5 MG tablet Take 7.5 mg by mouth daily with breakfast.      TRELEGY ELLIPTA 100-62.5-25 MCG/INH AEPB  Inhale 1 puff into the lungs daily.     metoprolol tartrate (LOPRESSOR) 25 MG tablet Take 1 tablet (25 mg total) by mouth 2 (two) times daily. (Patient not taking: Reported on 12/10/2020) 60 tablet 2   No current facility-administered medications on file prior to visit.    Cardiovascular & other pertient studies:  EKG 12/10/2020: Atrial flutter with 4:1 AV conduction Ventricular rate 68 bpm   Recent labs: 12/06/2020: Glucose 130, BUN/Cr 35/2.0. EGFR 31. Na/K 140/4.6.  BNP 355, trop HS 21 H/H 9/29. MCV 81. Platelets 218  11/24/2020: Glucose 152, BUN/Cr 28/1.43. EGFR 47. Na/K 141/3.7  11/23/2020: Glucose 245, BUN/Cr 39/1.81. EGFR 35. Na/K 143/4.0   Review of Systems  Unable to perform ROS: Dementia  Review of systems is primarily obtained through patient's wife and son.      Vitals:   12/10/20 1440  BP: (!) 143/66  Pulse: 68  Resp: 16  Temp: 98 F (36.7 C)  SpO2: 95%    Body mass index is 22.96 kg/m. Filed Weights   12/10/20 1440  Weight: 160 lb (72.6 kg)     Objective:   Physical Exam Vitals and nursing note reviewed.  Constitutional:      General: He is not in acute distress. Neck:     Vascular: No JVD.  Cardiovascular:     Rate and Rhythm: Normal rate and regular rhythm.     Heart sounds: Normal heart sounds. No murmur heard. Pulmonary:     Effort: Pulmonary effort is normal. No tachypnea.     Breath sounds: Normal breath sounds. No wheezing or rales.  Musculoskeletal:     Right lower leg: Edema (1+) present.     Left lower leg: Edema (1+) present.          Assessment & Recommendations:   87 y/o [place] male with hypertension, type 2 DM, CAD, dementia, admitted with syncope, paroxysmal atrial flutter  Persistent atrial flutter: Currently rate controlled at 68 bpm.  Ventricular rate has been variable at home ranging from 44-140 bpm. Continue diltiazem 180 mg daily. Resume metoprolol tartrate 25 mg twice daily.  While patient did have agitation  after starting metoprolol, I suspect agitation was more likely related to sleepless nights owing to orthopnea. See below regarding heart failure management Placed on 1 week of cardiac telemetry Zio patch.  Acute on chronic HFpEF: Elevated BNP on recent ED visit.  Physical exam positive for 1+ bilateral leg edema. Resume Lasix at 40 mg daily.  Acute kidney injury: Creatinine 2.0 on 12/06/2020.  I suspect this was also related to his acute on chronic HFpEF.  I am optimistic that creatinine will improve after diuresis.  Patient's son, who is a physician, will obtain BMP to his office and forward the results to me.  Follow-up in 2 weeks with me.  Depending on patient's clinical status, this appointment could be in person or virtual visit.   Nigel Mormon, MD Pager: [phone] Office: [phone]

## 2020-12-11 ENCOUNTER — Telehealth: Payer: Self-pay

## 2020-12-11 DIAGNOSIS — G309 Alzheimer's disease, unspecified: Secondary | ICD-10-CM | POA: Diagnosis not present

## 2020-12-11 DIAGNOSIS — F0280 Dementia in other diseases classified elsewhere without behavioral disturbance: Secondary | ICD-10-CM | POA: Diagnosis not present

## 2020-12-11 DIAGNOSIS — N1831 Chronic kidney disease, stage 3a: Secondary | ICD-10-CM | POA: Diagnosis not present

## 2020-12-11 DIAGNOSIS — E860 Dehydration: Secondary | ICD-10-CM | POA: Diagnosis not present

## 2020-12-11 DIAGNOSIS — I959 Hypotension, unspecified: Secondary | ICD-10-CM | POA: Diagnosis not present

## 2020-12-11 DIAGNOSIS — I130 Hypertensive heart and chronic kidney disease with heart failure and stage 1 through stage 4 chronic kidney disease, or unspecified chronic kidney disease: Secondary | ICD-10-CM | POA: Diagnosis not present

## 2020-12-11 DIAGNOSIS — I4892 Unspecified atrial flutter: Secondary | ICD-10-CM | POA: Diagnosis not present

## 2020-12-11 DIAGNOSIS — I5032 Chronic diastolic (congestive) heart failure: Secondary | ICD-10-CM | POA: Diagnosis not present

## 2020-12-11 DIAGNOSIS — E1122 Type 2 diabetes mellitus with diabetic chronic kidney disease: Secondary | ICD-10-CM | POA: Diagnosis not present

## 2020-12-11 NOTE — Telephone Encounter (Signed)
Called pt caregiver answered, inform he about the message above.she understood.

## 2020-12-11 NOTE — Telephone Encounter (Signed)
Recommend giving his scheduled medications. I do not think his sleepiness is cardiac in origin.   Thanks MJP

## 2020-12-17 DIAGNOSIS — F0280 Dementia in other diseases classified elsewhere without behavioral disturbance: Secondary | ICD-10-CM | POA: Diagnosis not present

## 2020-12-17 DIAGNOSIS — I5032 Chronic diastolic (congestive) heart failure: Secondary | ICD-10-CM | POA: Diagnosis not present

## 2020-12-17 DIAGNOSIS — I130 Hypertensive heart and chronic kidney disease with heart failure and stage 1 through stage 4 chronic kidney disease, or unspecified chronic kidney disease: Secondary | ICD-10-CM | POA: Diagnosis not present

## 2020-12-17 DIAGNOSIS — R0600 Dyspnea, unspecified: Secondary | ICD-10-CM | POA: Diagnosis not present

## 2020-12-17 DIAGNOSIS — E860 Dehydration: Secondary | ICD-10-CM | POA: Diagnosis not present

## 2020-12-17 DIAGNOSIS — I959 Hypotension, unspecified: Secondary | ICD-10-CM | POA: Diagnosis not present

## 2020-12-17 DIAGNOSIS — E1122 Type 2 diabetes mellitus with diabetic chronic kidney disease: Secondary | ICD-10-CM | POA: Diagnosis not present

## 2020-12-17 DIAGNOSIS — G309 Alzheimer's disease, unspecified: Secondary | ICD-10-CM | POA: Diagnosis not present

## 2020-12-17 DIAGNOSIS — N1831 Chronic kidney disease, stage 3a: Secondary | ICD-10-CM | POA: Diagnosis not present

## 2020-12-17 DIAGNOSIS — I4892 Unspecified atrial flutter: Secondary | ICD-10-CM | POA: Diagnosis not present

## 2020-12-21 DIAGNOSIS — I483 Typical atrial flutter: Secondary | ICD-10-CM | POA: Diagnosis not present

## 2020-12-22 DIAGNOSIS — I483 Typical atrial flutter: Secondary | ICD-10-CM | POA: Diagnosis not present

## 2020-12-23 DIAGNOSIS — I4892 Unspecified atrial flutter: Secondary | ICD-10-CM | POA: Diagnosis not present

## 2020-12-23 DIAGNOSIS — I130 Hypertensive heart and chronic kidney disease with heart failure and stage 1 through stage 4 chronic kidney disease, or unspecified chronic kidney disease: Secondary | ICD-10-CM | POA: Diagnosis not present

## 2020-12-23 DIAGNOSIS — I5032 Chronic diastolic (congestive) heart failure: Secondary | ICD-10-CM | POA: Diagnosis not present

## 2020-12-23 DIAGNOSIS — F0280 Dementia in other diseases classified elsewhere without behavioral disturbance: Secondary | ICD-10-CM | POA: Diagnosis not present

## 2020-12-23 DIAGNOSIS — E1122 Type 2 diabetes mellitus with diabetic chronic kidney disease: Secondary | ICD-10-CM | POA: Diagnosis not present

## 2020-12-23 DIAGNOSIS — G309 Alzheimer's disease, unspecified: Secondary | ICD-10-CM | POA: Diagnosis not present

## 2020-12-23 DIAGNOSIS — E860 Dehydration: Secondary | ICD-10-CM | POA: Diagnosis not present

## 2020-12-23 DIAGNOSIS — N1831 Chronic kidney disease, stage 3a: Secondary | ICD-10-CM | POA: Diagnosis not present

## 2020-12-23 DIAGNOSIS — I959 Hypotension, unspecified: Secondary | ICD-10-CM | POA: Diagnosis not present

## 2020-12-24 NOTE — Progress Notes (Signed)
Discussed monitor results with patient's son [name]. Patient is not taking metoprolol as it caused a lot of fatigue and shortness of breath. Cr is down to 1.7. We mutually decided to continue diltiazem 180 mg unless heart rate consistently stays in 40s as it is today. Follow up as needed.  Nigel Mormon, MD

## 2020-12-29 DIAGNOSIS — E860 Dehydration: Secondary | ICD-10-CM | POA: Diagnosis not present

## 2020-12-29 DIAGNOSIS — F0280 Dementia in other diseases classified elsewhere without behavioral disturbance: Secondary | ICD-10-CM | POA: Diagnosis not present

## 2020-12-29 DIAGNOSIS — I5032 Chronic diastolic (congestive) heart failure: Secondary | ICD-10-CM | POA: Diagnosis not present

## 2020-12-29 DIAGNOSIS — G309 Alzheimer's disease, unspecified: Secondary | ICD-10-CM | POA: Diagnosis not present

## 2020-12-29 DIAGNOSIS — N1831 Chronic kidney disease, stage 3a: Secondary | ICD-10-CM | POA: Diagnosis not present

## 2020-12-29 DIAGNOSIS — I959 Hypotension, unspecified: Secondary | ICD-10-CM | POA: Diagnosis not present

## 2020-12-29 DIAGNOSIS — E1122 Type 2 diabetes mellitus with diabetic chronic kidney disease: Secondary | ICD-10-CM | POA: Diagnosis not present

## 2020-12-29 DIAGNOSIS — I4892 Unspecified atrial flutter: Secondary | ICD-10-CM | POA: Diagnosis not present

## 2020-12-29 DIAGNOSIS — I130 Hypertensive heart and chronic kidney disease with heart failure and stage 1 through stage 4 chronic kidney disease, or unspecified chronic kidney disease: Secondary | ICD-10-CM | POA: Diagnosis not present

## 2021-01-06 DIAGNOSIS — I4892 Unspecified atrial flutter: Secondary | ICD-10-CM | POA: Diagnosis not present

## 2021-01-06 DIAGNOSIS — F0280 Dementia in other diseases classified elsewhere without behavioral disturbance: Secondary | ICD-10-CM | POA: Diagnosis not present

## 2021-01-06 DIAGNOSIS — I5032 Chronic diastolic (congestive) heart failure: Secondary | ICD-10-CM | POA: Diagnosis not present

## 2021-01-06 DIAGNOSIS — E860 Dehydration: Secondary | ICD-10-CM | POA: Diagnosis not present

## 2021-01-06 DIAGNOSIS — N1831 Chronic kidney disease, stage 3a: Secondary | ICD-10-CM | POA: Diagnosis not present

## 2021-01-06 DIAGNOSIS — I959 Hypotension, unspecified: Secondary | ICD-10-CM | POA: Diagnosis not present

## 2021-01-06 DIAGNOSIS — E1122 Type 2 diabetes mellitus with diabetic chronic kidney disease: Secondary | ICD-10-CM | POA: Diagnosis not present

## 2021-01-06 DIAGNOSIS — G309 Alzheimer's disease, unspecified: Secondary | ICD-10-CM | POA: Diagnosis not present

## 2021-01-06 DIAGNOSIS — I130 Hypertensive heart and chronic kidney disease with heart failure and stage 1 through stage 4 chronic kidney disease, or unspecified chronic kidney disease: Secondary | ICD-10-CM | POA: Diagnosis not present

## 2021-01-11 DIAGNOSIS — I4892 Unspecified atrial flutter: Secondary | ICD-10-CM | POA: Diagnosis not present

## 2021-01-11 DIAGNOSIS — R2681 Unsteadiness on feet: Secondary | ICD-10-CM | POA: Diagnosis not present

## 2021-01-11 DIAGNOSIS — J439 Emphysema, unspecified: Secondary | ICD-10-CM | POA: Diagnosis not present

## 2021-01-11 DIAGNOSIS — G309 Alzheimer's disease, unspecified: Secondary | ICD-10-CM | POA: Diagnosis not present

## 2021-01-11 DIAGNOSIS — I10 Essential (primary) hypertension: Secondary | ICD-10-CM | POA: Diagnosis not present

## 2021-01-11 DIAGNOSIS — E1151 Type 2 diabetes mellitus with diabetic peripheral angiopathy without gangrene: Secondary | ICD-10-CM | POA: Diagnosis not present

## 2021-01-11 DIAGNOSIS — G4733 Obstructive sleep apnea (adult) (pediatric): Secondary | ICD-10-CM | POA: Diagnosis not present

## 2021-01-11 DIAGNOSIS — H9190 Unspecified hearing loss, unspecified ear: Secondary | ICD-10-CM | POA: Diagnosis not present

## 2021-01-13 DIAGNOSIS — I4892 Unspecified atrial flutter: Secondary | ICD-10-CM | POA: Diagnosis not present

## 2021-01-13 DIAGNOSIS — E1122 Type 2 diabetes mellitus with diabetic chronic kidney disease: Secondary | ICD-10-CM | POA: Diagnosis not present

## 2021-01-13 DIAGNOSIS — G309 Alzheimer's disease, unspecified: Secondary | ICD-10-CM | POA: Diagnosis not present

## 2021-01-13 DIAGNOSIS — E860 Dehydration: Secondary | ICD-10-CM | POA: Diagnosis not present

## 2021-01-13 DIAGNOSIS — N1831 Chronic kidney disease, stage 3a: Secondary | ICD-10-CM | POA: Diagnosis not present

## 2021-01-13 DIAGNOSIS — I5032 Chronic diastolic (congestive) heart failure: Secondary | ICD-10-CM | POA: Diagnosis not present

## 2021-01-13 DIAGNOSIS — I959 Hypotension, unspecified: Secondary | ICD-10-CM | POA: Diagnosis not present

## 2021-01-13 DIAGNOSIS — F0280 Dementia in other diseases classified elsewhere without behavioral disturbance: Secondary | ICD-10-CM | POA: Diagnosis not present

## 2021-01-13 DIAGNOSIS — I130 Hypertensive heart and chronic kidney disease with heart failure and stage 1 through stage 4 chronic kidney disease, or unspecified chronic kidney disease: Secondary | ICD-10-CM | POA: Diagnosis not present

## 2021-01-20 DIAGNOSIS — E1122 Type 2 diabetes mellitus with diabetic chronic kidney disease: Secondary | ICD-10-CM | POA: Diagnosis not present

## 2021-01-20 DIAGNOSIS — N1831 Chronic kidney disease, stage 3a: Secondary | ICD-10-CM | POA: Diagnosis not present

## 2021-01-20 DIAGNOSIS — I959 Hypotension, unspecified: Secondary | ICD-10-CM | POA: Diagnosis not present

## 2021-01-20 DIAGNOSIS — E860 Dehydration: Secondary | ICD-10-CM | POA: Diagnosis not present

## 2021-01-20 DIAGNOSIS — G309 Alzheimer's disease, unspecified: Secondary | ICD-10-CM | POA: Diagnosis not present

## 2021-01-20 DIAGNOSIS — I5032 Chronic diastolic (congestive) heart failure: Secondary | ICD-10-CM | POA: Diagnosis not present

## 2021-01-20 DIAGNOSIS — I130 Hypertensive heart and chronic kidney disease with heart failure and stage 1 through stage 4 chronic kidney disease, or unspecified chronic kidney disease: Secondary | ICD-10-CM | POA: Diagnosis not present

## 2021-01-20 DIAGNOSIS — F0280 Dementia in other diseases classified elsewhere without behavioral disturbance: Secondary | ICD-10-CM | POA: Diagnosis not present

## 2021-01-20 DIAGNOSIS — I4892 Unspecified atrial flutter: Secondary | ICD-10-CM | POA: Diagnosis not present

## 2021-01-26 DIAGNOSIS — I130 Hypertensive heart and chronic kidney disease with heart failure and stage 1 through stage 4 chronic kidney disease, or unspecified chronic kidney disease: Secondary | ICD-10-CM | POA: Diagnosis not present

## 2021-01-26 DIAGNOSIS — D509 Iron deficiency anemia, unspecified: Secondary | ICD-10-CM | POA: Diagnosis not present

## 2021-01-26 DIAGNOSIS — F0390 Unspecified dementia without behavioral disturbance: Secondary | ICD-10-CM | POA: Diagnosis not present

## 2021-01-26 DIAGNOSIS — N1832 Chronic kidney disease, stage 3b: Secondary | ICD-10-CM | POA: Diagnosis not present

## 2021-01-26 DIAGNOSIS — E1151 Type 2 diabetes mellitus with diabetic peripheral angiopathy without gangrene: Secondary | ICD-10-CM | POA: Diagnosis not present

## 2021-01-26 DIAGNOSIS — R2681 Unsteadiness on feet: Secondary | ICD-10-CM | POA: Diagnosis not present

## 2021-01-26 DIAGNOSIS — D508 Other iron deficiency anemias: Secondary | ICD-10-CM | POA: Diagnosis not present

## 2021-02-10 DIAGNOSIS — Z23 Encounter for immunization: Secondary | ICD-10-CM | POA: Diagnosis not present

## 2021-02-16 DIAGNOSIS — H401123 Primary open-angle glaucoma, left eye, severe stage: Secondary | ICD-10-CM | POA: Diagnosis not present

## 2021-02-16 DIAGNOSIS — H401111 Primary open-angle glaucoma, right eye, mild stage: Secondary | ICD-10-CM | POA: Diagnosis not present

## 2021-02-21 ENCOUNTER — Other Ambulatory Visit: Payer: Self-pay | Admitting: Cardiology

## 2021-02-21 DIAGNOSIS — I484 Atypical atrial flutter: Secondary | ICD-10-CM

## 2021-07-16 ENCOUNTER — Other Ambulatory Visit: Payer: Self-pay

## 2021-07-16 ENCOUNTER — Emergency Department (HOSPITAL_COMMUNITY): Payer: Medicare PPO

## 2021-07-16 ENCOUNTER — Emergency Department (HOSPITAL_COMMUNITY)
Admission: EM | Admit: 2021-07-16 | Discharge: 2021-07-16 | Disposition: A | Discharge status: Home / Self Care | Payer: Medicare PPO | Attending: Emergency Medicine | Admitting: Emergency Medicine

## 2021-07-16 DIAGNOSIS — R531 Weakness: Secondary | ICD-10-CM | POA: Diagnosis not present

## 2021-07-16 DIAGNOSIS — R55 Syncope and collapse: Secondary | ICD-10-CM | POA: Diagnosis not present

## 2021-07-16 DIAGNOSIS — E1122 Type 2 diabetes mellitus with diabetic chronic kidney disease: Secondary | ICD-10-CM | POA: Insufficient documentation

## 2021-07-16 DIAGNOSIS — W19XXXA Unspecified fall, initial encounter: Secondary | ICD-10-CM | POA: Insufficient documentation

## 2021-07-16 DIAGNOSIS — R410 Disorientation, unspecified: Secondary | ICD-10-CM | POA: Insufficient documentation

## 2021-07-16 DIAGNOSIS — F0390 Unspecified dementia without behavioral disturbance: Secondary | ICD-10-CM | POA: Insufficient documentation

## 2021-07-16 DIAGNOSIS — Z7901 Long term (current) use of anticoagulants: Secondary | ICD-10-CM | POA: Diagnosis not present

## 2021-07-16 DIAGNOSIS — I10 Essential (primary) hypertension: Secondary | ICD-10-CM | POA: Diagnosis not present

## 2021-07-16 DIAGNOSIS — J90 Pleural effusion, not elsewhere classified: Secondary | ICD-10-CM | POA: Diagnosis not present

## 2021-07-16 DIAGNOSIS — Z794 Long term (current) use of insulin: Secondary | ICD-10-CM | POA: Insufficient documentation

## 2021-07-16 DIAGNOSIS — N1832 Chronic kidney disease, stage 3b: Secondary | ICD-10-CM | POA: Insufficient documentation

## 2021-07-16 DIAGNOSIS — S199XXA Unspecified injury of neck, initial encounter: Secondary | ICD-10-CM | POA: Diagnosis not present

## 2021-07-16 DIAGNOSIS — S0990XA Unspecified injury of head, initial encounter: Secondary | ICD-10-CM | POA: Diagnosis not present

## 2021-07-16 DIAGNOSIS — I129 Hypertensive chronic kidney disease with stage 1 through stage 4 chronic kidney disease, or unspecified chronic kidney disease: Secondary | ICD-10-CM | POA: Diagnosis not present

## 2021-07-16 DIAGNOSIS — Z79899 Other long term (current) drug therapy: Secondary | ICD-10-CM | POA: Insufficient documentation

## 2021-07-16 DIAGNOSIS — I959 Hypotension, unspecified: Secondary | ICD-10-CM | POA: Diagnosis not present

## 2021-07-16 LAB — COMPREHENSIVE METABOLIC PANEL
ALT: 12 U/L (ref 0–44)
AST: 21 U/L (ref 15–41)
Albumin: 2.5 g/dL — ABNORMAL LOW (ref 3.5–5.0)
Alkaline Phosphatase: 55 U/L (ref 38–126)
Anion gap: 8 (ref 5–15)
BUN: 31 mg/dL — ABNORMAL HIGH (ref 8–23)
CO2: 25 mmol/L (ref 22–32)
Calcium: 7.9 mg/dL — ABNORMAL LOW (ref 8.9–10.3)
Chloride: 110 mmol/L (ref 98–111)
Creatinine, Ser: 1.5 mg/dL — ABNORMAL HIGH (ref 0.61–1.24)
GFR, Estimated: 44 mL/min — ABNORMAL LOW (ref >60)
Glucose, Bld: 84 mg/dL (ref 70–99)
Potassium: 4 mmol/L (ref 3.5–5.1)
Sodium: 143 mmol/L (ref 135–145)
Total Bilirubin: 0.5 mg/dL (ref 0.3–1.2)
Total Protein: 4.7 g/dL — ABNORMAL LOW (ref 6.5–8.1)

## 2021-07-16 LAB — CBC WITH DIFFERENTIAL/PLATELET
Abs Immature Granulocytes: 0.16 10*3/uL — ABNORMAL HIGH (ref 0.00–0.07)
Basophils Absolute: 0 10*3/uL (ref 0.0–0.1)
Basophils Relative: 0 %
Eosinophils Absolute: 0.1 10*3/uL (ref 0.0–0.5)
Eosinophils Relative: 1 %
HCT: 28.4 % — ABNORMAL LOW (ref 39.0–52.0)
Hemoglobin: 9 g/dL — ABNORMAL LOW (ref 13.0–17.0)
Immature Granulocytes: 1 %
Lymphocytes Relative: 7 %
Lymphs Abs: 0.8 10*3/uL (ref 0.7–4.0)
MCH: 25.8 pg — ABNORMAL LOW (ref 26.0–34.0)
MCHC: 31.7 g/dL (ref 30.0–36.0)
MCV: 81.4 fL (ref 80.0–100.0)
Monocytes Absolute: 0.6 10*3/uL (ref 0.1–1.0)
Monocytes Relative: 4 %
Neutro Abs: 11 10*3/uL — ABNORMAL HIGH (ref 1.7–7.7)
Neutrophils Relative %: 87 %
Platelets: 182 10*3/uL (ref 150–400)
RBC: 3.49 MIL/uL — ABNORMAL LOW (ref 4.22–5.81)
RDW: 16.8 % — ABNORMAL HIGH (ref 11.5–15.5)
WBC: 12.7 10*3/uL — ABNORMAL HIGH (ref 4.0–10.5)
nRBC: 0 % (ref 0.0–0.2)

## 2021-07-16 LAB — CBG MONITORING, ED: Glucose-Capillary: 93 mg/dL (ref 70–99)

## 2021-07-16 LAB — TROPONIN I (HIGH SENSITIVITY): Troponin I (High Sensitivity): 17 ng/L (ref <18)

## 2021-07-16 MED ORDER — SODIUM CHLORIDE 0.9 % IV BOLUS
500.0000 mL | Freq: Once | INTRAVENOUS | Status: AC
  Administered 2021-07-16: 500 mL via INTRAVENOUS

## 2021-07-16 NOTE — ED Notes (Signed)
Help get patient on the monitor did ekg patient is resting with call bell in reach ?

## 2021-07-16 NOTE — ED Triage Notes (Signed)
Pt presents to ED for hypotension and falls. Pt is on Plavix, lives at home with wife. Pt is confused at baseline, hx of dementia.  ?

## 2021-07-16 NOTE — ED Notes (Signed)
[facility] called pt has 26 people infrot of them ?

## 2021-07-16 NOTE — ED Provider Notes (Signed)
?[facility] ?Provider Note ? ? ?CSN: [id number] ?Arrival date & time: 07/16/21  1307 ? ?  ? ?History ? ?Chief Complaint  ?Patient presents with  ? Hypotension  ? Fall  ? ? ?[name] is a 88 y.o. male with a past medical history of hypertension, diabetes, stage IIIb CKD and dementia presenting today after a reported fall.  Due to his history of dementia, personal history taking was limited. ? ? ?Spoke with patient's son who reports that the patient ambulates with a walker at baseline.  He has large variations in his blood pressure, some days hypertensive other days hypotensive.  He has a history of adrenal insufficiency and when he has hypotensive episodes, they administer a dose of prednisone.  He also expressed that the patient is a DNR and family would prefer a light work-up and discharge. ? ?I also spoke with the patient's daytime caregiver.  She reports that he has 24-hour care however she was on shift when he experienced this fall.  Says that he was trying to go to bed because he says he has been feeling weak however moved too quickly and started to fall down.  She reports that she and the other individual in the household were able to catch him on his way down.  They do not believe he lost consciousness.  He did not hit his head or sustain any other injuries per their knowledge.  She reports that his blood pressures have been varying today, the first 1 being 142/92, the second one 117/46, and the most recent 99/47 shortly after his fall. ? ?  ? ?Home Medications ?Prior to Admission medications   ?Medication Sig Start Date End Date Taking? Authorizing Provider  ?acetaminophen (TYLENOL) 325 MG tablet Take 2 tablets (650 mg total) by mouth every 6 (six) hours as needed for moderate pain. 11/13/19   Arrien, Jimmy Picket, MD  ?apixaban (ELIQUIS) 2.5 MG TABS tablet Take 1 tablet (2.5 mg total) by mouth 2 (two) times daily. 11/24/20 12/24/20  Antonieta Pert, MD  ?atorvastatin  (LIPITOR) 20 MG tablet Take 20 mg by mouth daily.     [provider]  ?brimonidine (ALPHAGAN) 0.15 % ophthalmic solution Place 1 drop into the left eye 2 (two) times daily. 10/29/19   [provider]  ?Cholecalciferol (VITAMIN D) 2000 units CAPS Take 2,000 Units by mouth daily with supper.     [provider]  ?diltiazem (CARDIZEM CD) 120 MG 24 hr capsule Take 1 capsule (120 mg total) by mouth daily. ?Patient taking differently: Take 180 mg by mouth daily. 11/25/20 12/25/20  Antonieta Pert, MD  ?diltiazem (CARDIZEM) 30 MG tablet Take 1 tablet by mouth as needed. 11/29/20   [provider]  ?divalproex (DEPAKOTE) 125 MG DR tablet Take 125 mg by mouth at bedtime. 07/24/20   [provider]  ?donepezil (ARICEPT) 10 MG tablet Take 10 mg by mouth daily with supper. 04/08/17   [provider]  ?escitalopram (LEXAPRO) 5 MG tablet Take 5 mg by mouth daily with supper. 02/13/17   [provider]  ?finasteride (PROSCAR) 5 MG tablet Take 5 mg by mouth every evening. 08/22/20   [provider]  ?furosemide (LASIX) 40 MG tablet Take 1 tablet (40 mg total) by mouth daily as needed for fluid or edema (and shortness of breath). 11/24/20   Antonieta Pert, MD  ?hydrALAZINE (APRESOLINE) 50 MG tablet 1 tablet p.o. every 8 hours as needed for blood pressure systolic greater  than 150.  If blood pressure is less than 867 systolic do not give the hydralazine. 12/06/20   Fredia Sorrow, MD  ?insulin lispro (HUMALOG) 100 UNIT/ML KiwkPen Inject 2-5 Units into the skin See admin instructions. 2-5 units per sliding scale    [provider]  ?latanoprost (XALATAN) 0.005 % ophthalmic solution Place 1 drop into both eyes at bedtime. 06/20/19   [provider]  ?loratadine (CLARITIN) 10 MG tablet Take 10 mg by mouth daily.    [provider]  ?memantine (NAMENDA) 10 MG tablet Take 10 mg by mouth 2 (two) times daily. 03/18/17   [provider]  ?metoprolol  tartrate (LOPRESSOR) 25 MG tablet TAKE 1 TABLET BY MOUTH TWICE A DAY 02/22/21   Patwardhan, Manish J, MD  ?montelukast (SINGULAIR) 10 MG tablet Take 10 mg by mouth daily. 11/07/20   [provider]  ?Multiple Vitamin (MULTIVITAMIN WITH MINERALS) TABS tablet Take 1 tablet by mouth daily.    [provider]  ?MYRBETRIQ 50 MG TB24 tablet Take 50 mg by mouth every evening. 08/31/20   [provider]  ?nitroGLYCERIN (NITROSTAT) 0.4 MG SL tablet Place 0.4 mg under the tongue every 5 (five) minutes as needed for chest pain.     [provider]  ?pantoprazole (PROTONIX) 40 MG tablet Take 40 mg by mouth 2 (two) times daily. 11/07/20   [provider]  ?predniSONE (DELTASONE) 5 MG tablet Take 7.5 mg by mouth daily with breakfast.     [provider]  ?TRELEGY ELLIPTA 100-62.5-25 MCG/INH AEPB Inhale 1 puff into the lungs daily. 10/27/20   [provider]  ?   ? ?Allergies    ?Ciprofloxacin, Ciprofloxacin hcl, Penicillins, Penicillins, Sulfa antibiotics, Sulfamethoxazole, and Sulfonamide derivatives   ? ?Review of Systems   ?Review of Systems ?Level 5 caveat due to AMS however denies any pain ?Physical Exam ?Updated Vital Signs ?BP (!) 111/59 (BP Location: Right Arm)   Pulse 63   Temp 97.9 ?F (36.6 ?C) (Oral)   Resp 15   Ht '5\' 10"'$  (1.778 m)   Wt 72.6 kg   SpO2 100%   BMI 22.97 kg/m?  ?Physical Exam ?Vitals and nursing note reviewed.  ?Constitutional:   ?   Appearance: Normal appearance.  ?HENT:  ?   Head: Normocephalic and atraumatic.  ?Eyes:  ?   General: No scleral icterus. ?   Conjunctiva/sclera: Conjunctivae normal.  ?Pulmonary:  ?   Effort: Pulmonary effort is normal. No respiratory distress.  ?Skin: ?   Findings: No rash.  ?Neurological:  ?   Mental Status: He is alert.  ?Psychiatric:     ?   Mood and Affect: Mood normal.  ?   Comments: Very pleasantly confused.  Alert to self and location however not to time.  ? ? ?ED Results / Procedures / Treatments    ?Labs ?(all labs ordered are listed, but only abnormal results are displayed) ?Labs Reviewed  ?CBC WITH DIFFERENTIAL/PLATELET - Abnormal; Notable for the following components:  ?    Result Value  ? WBC 12.7 (*)   ? RBC 3.49 (*)   ? Hemoglobin 9.0 (*)   ? HCT 28.4 (*)   ? MCH 25.8 (*)   ? RDW 16.8 (*)   ? Neutro Abs 11.0 (*)   ? Abs Immature Granulocytes 0.16 (*)   ? All other components within normal limits  ?COMPREHENSIVE METABOLIC PANEL - Abnormal; Notable for the following components:  ? BUN 31 (*)   ?  Creatinine, Ser 1.50 (*)   ? Calcium 7.9 (*)   ? Total Protein 4.7 (*)   ? Albumin 2.5 (*)   ? GFR, Estimated 44 (*)   ? All other components within normal limits  ?CBG MONITORING, ED  ?TROPONIN I (HIGH SENSITIVITY)  ? ? ?EKG ?EKG Interpretation ? ?Date/Time:  Friday July 16 2021 13:11:55 EDT ?Ventricular Rate:  60 ?PR Interval:  123 ?QRS Duration: 91 ?QT Interval:  453 ?QTC Calculation: 453 ?R Axis:   2 ?Text Interpretation: Sinus rhythm Atrial premature complex Confirmed by Octaviano Glow [phone]) on 07/16/2021 1:20:06 PM ? ?Radiology ?CT Head Wo Contrast ? ?Result Date: 07/16/2021 ?CLINICAL DATA:  Trauma, fall EXAM: CT HEAD WITHOUT CONTRAST TECHNIQUE: Contiguous axial images were obtained from the base of the skull through the vertex without intravenous contrast. RADIATION DOSE REDUCTION: This exam was performed according to the departmental dose-optimization program which includes automated exposure control, adjustment of the mA and/or kV according to patient size and/or use of iterative reconstruction technique. COMPARISON:  11/22/2020 FINDINGS: There is patient motion in the some of the images limiting the study. Brain: No acute intracranial findings are seen. Cortical sulci are prominent. There is significant decrease in density in the periventricular and subcortical white matter in both cerebral hemispheres. There are calcifications in the basal ganglia on both sides. There are dense calcifications in the  falx and tentorium with no significant interval change. Vascular: Scattered arterial calcifications are seen. Skull: Unremarkable. Sinuses/Orbits: Mucous retention cysts and mucosal thickening are noted in the ethmoid

## 2021-07-16 NOTE — ED Notes (Signed)
Patient transported to CT 

## 2022-01-11 DIAGNOSIS — E1151 Type 2 diabetes mellitus with diabetic peripheral angiopathy without gangrene: Secondary | ICD-10-CM | POA: Diagnosis not present

## 2022-01-26 DIAGNOSIS — Z23 Encounter for immunization: Secondary | ICD-10-CM | POA: Diagnosis not present

## 2022-02-10 DIAGNOSIS — E1151 Type 2 diabetes mellitus with diabetic peripheral angiopathy without gangrene: Secondary | ICD-10-CM | POA: Diagnosis not present

## 2022-03-13 DIAGNOSIS — E1151 Type 2 diabetes mellitus with diabetic peripheral angiopathy without gangrene: Secondary | ICD-10-CM | POA: Diagnosis not present

## 2022-04-12 DIAGNOSIS — E1151 Type 2 diabetes mellitus with diabetic peripheral angiopathy without gangrene: Secondary | ICD-10-CM | POA: Diagnosis not present

## 2022-05-13 DIAGNOSIS — E1151 Type 2 diabetes mellitus with diabetic peripheral angiopathy without gangrene: Secondary | ICD-10-CM | POA: Diagnosis not present

## 2022-06-02 DEATH — deceased

## 2023-01-20 IMAGING — CT CT HEAD W/O CM
3 series · 15 of 47 positions shown, 18 images · non-contrast
Comparison: 05/07/2017

CLINICAL DATA: Multiple falls, blood thinners

EXAM:
CT HEAD WITHOUT CONTRAST
CT CERVICAL SPINE WITHOUT CONTRAST
TECHNIQUE: Multidetector CT imaging of the head and cervical spine was
performed following the standard protocol without intravenous
contrast. Multiplanar CT image reconstructions of the cervical spine
were also generated.

[Series 3: head 5.0 h30s · axial · 0.45mm/px · z∈[-234,-89]mm · 9 of 35 slices shown, 12 images]
[im 3/35  brain]
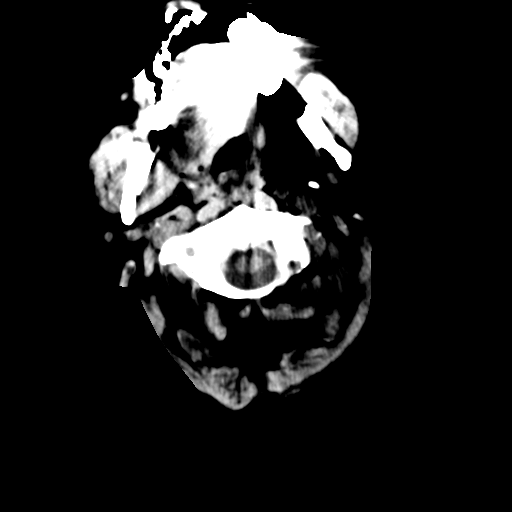
[im 3/35  bone]
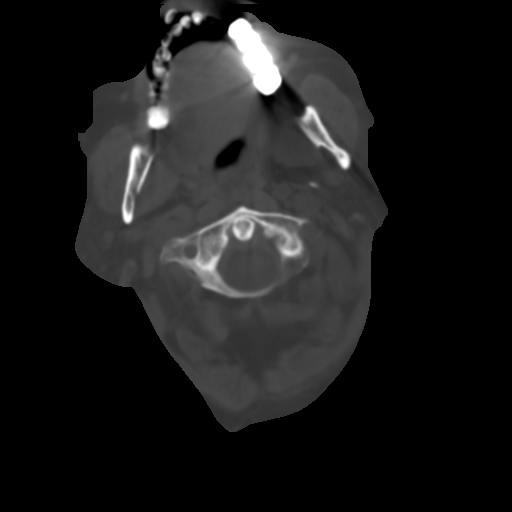
[im 6/35  brain]
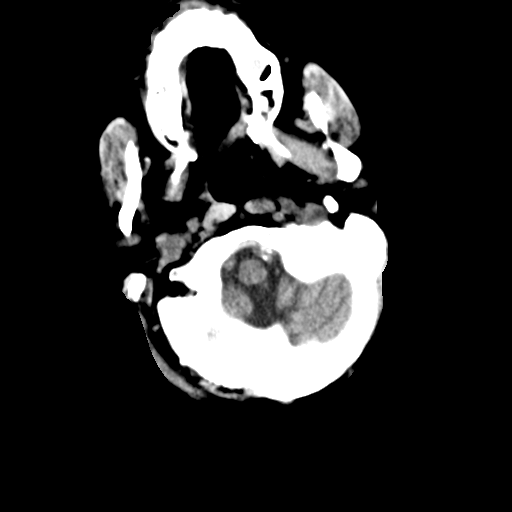
[im 10/35  brain]
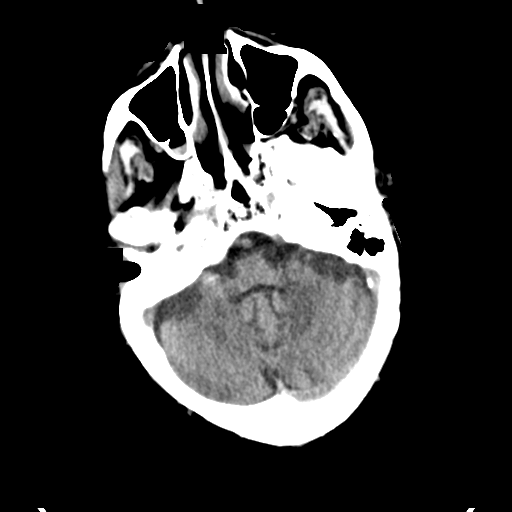
[im 13/35  brain]
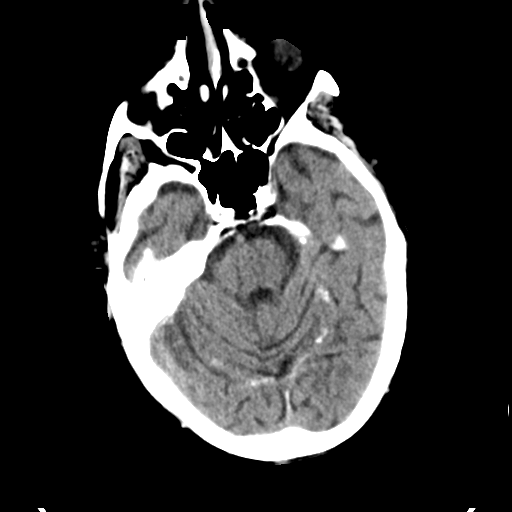
[im 18/35  brain]
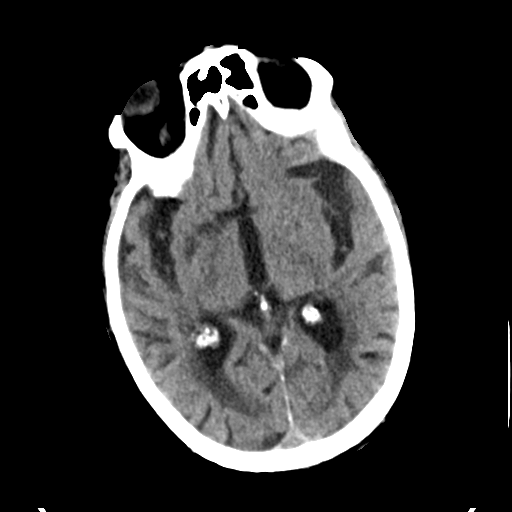
[im 18/35  bone]
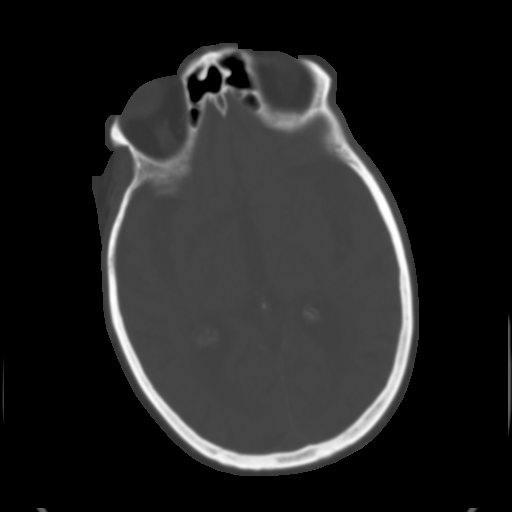
[im 22/35  brain]
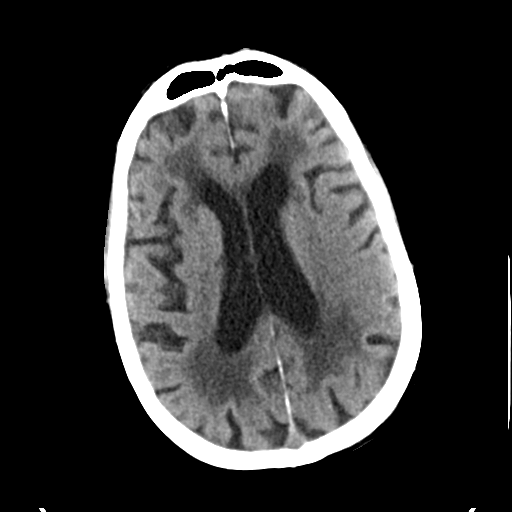
[im 25/35  brain]
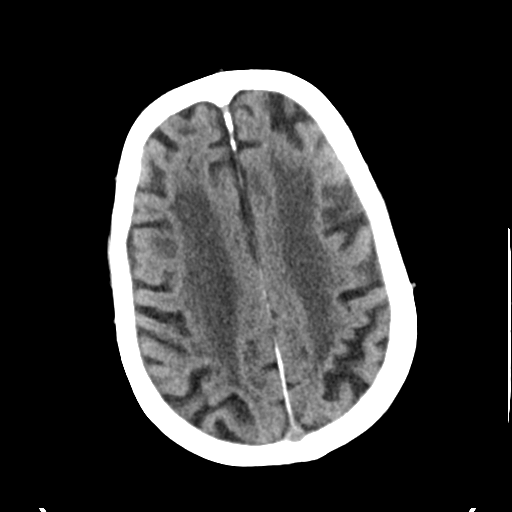
[im 29/35  brain]
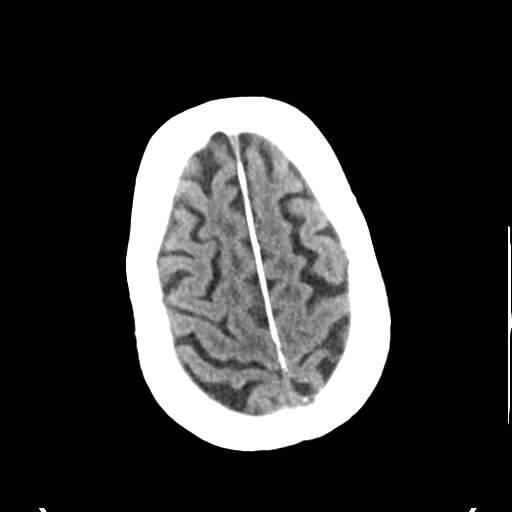
[im 32/35  brain]
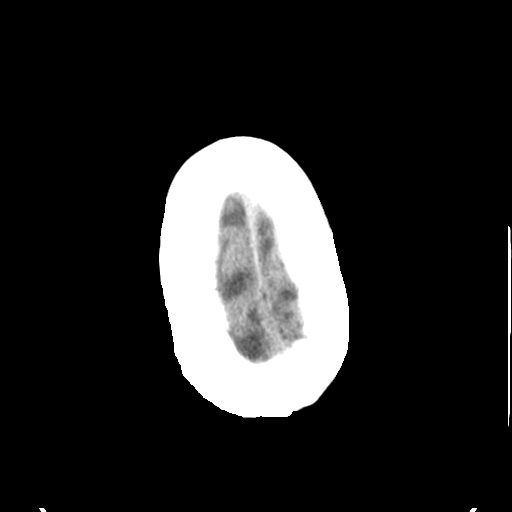
[im 32/35  bone]
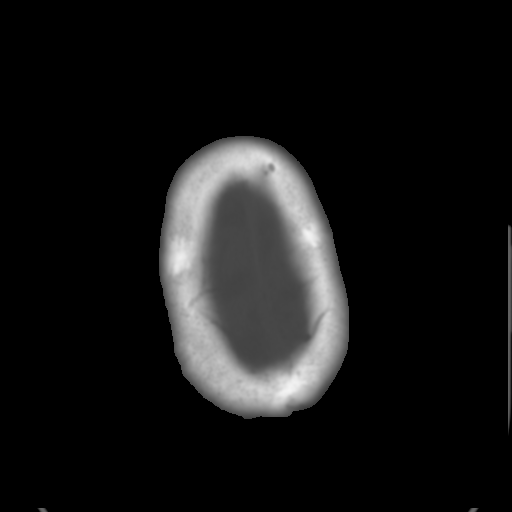

[Series 5: head 3.0 mpr cor · coronal · 0.34mm/px · 3 of 67 slices shown]
[im 23/67  brain]
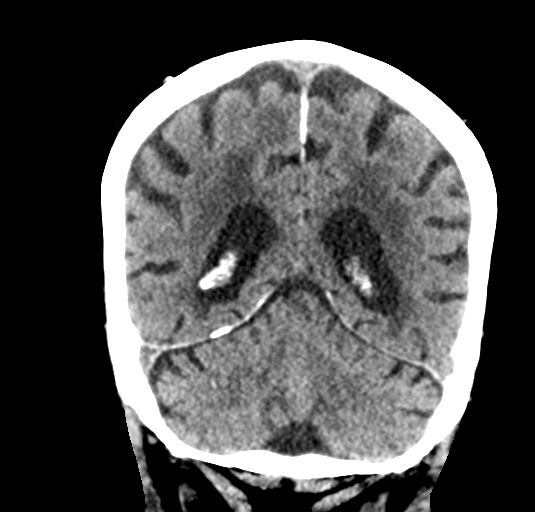
[im 30/67  brain]
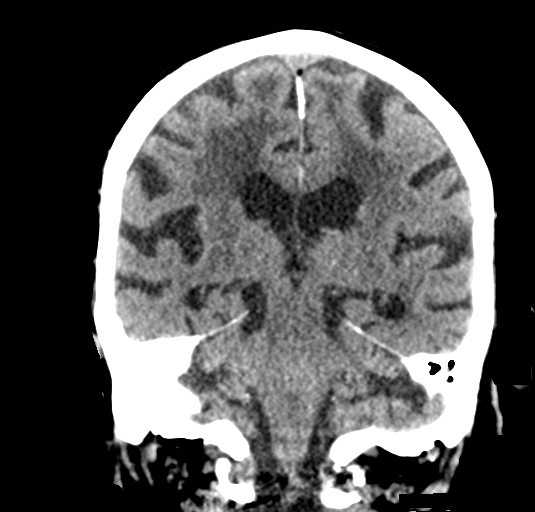
[im 37/67  brain]
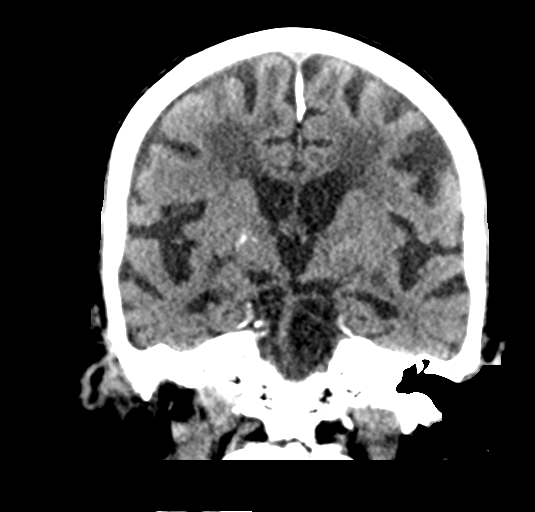

[Series 6: head 3.0 mpr sag · sagittal · 0.34mm/px · 3 of 58 slices shown]
[im 20/58  brain]
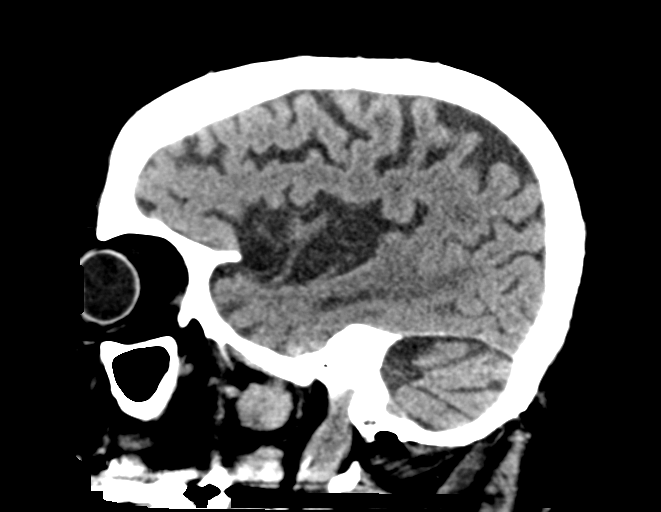
[im 29/58  brain]
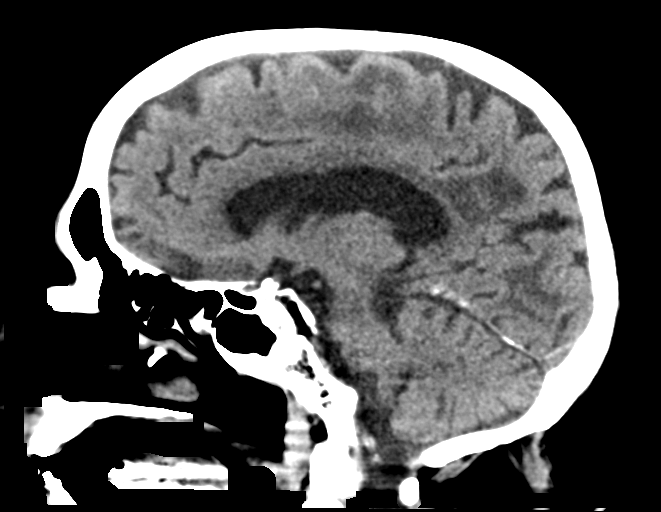
[im 39/58  brain]
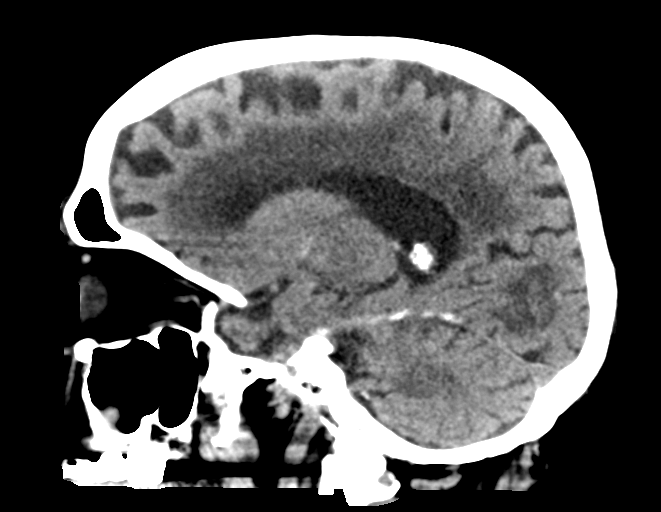

[15 of 47 positions shown; findings below may reference images not displayed]

FINDINGS: CT HEAD FINDINGS

Brain: No evidence of acute infarction, hemorrhage, hydrocephalus,
extra-axial collection or mass lesion/mass effect. Extent
periventricular and deep white matter hypodensity.

Vascular: No hyperdense vessel or unexpected calcification.

Skull: Normal. Negative for fracture or focal lesion.

Sinuses/Orbits: No acute finding.

Other: None.

CT CERVICAL SPINE FINDINGS

Alignment: Normal.

Skull base and vertebrae: No acute fracture. No primary bone lesion
or focal pathologic process.

Soft tissues and spinal canal: No prevertebral fluid or swelling. No
visible canal hematoma.

Disc levels: Moderate to severe multilevel disc space height loss
and osteophytosis, worst at C5 through C7.

Upper chest: Negative.

Other: None.
IMPRESSION: 1. No acute intracranial pathology. Advanced small-vessel white
matter disease.
2. No fracture or static subluxation of the cervical spine.
3. Moderate to severe multilevel disc space height loss and
osteophytosis, worst at C5 through C7.

## 2023-01-20 IMAGING — DX DG PORTABLE PELVIS
1 series · 1 of 1 positions shown · non-contrast
Comparison: CT AP 11/06/2019

CLINICAL DATA: Fall.

EXAM:
PORTABLE PELVIS 1-2 VIEWS

[pelvis]
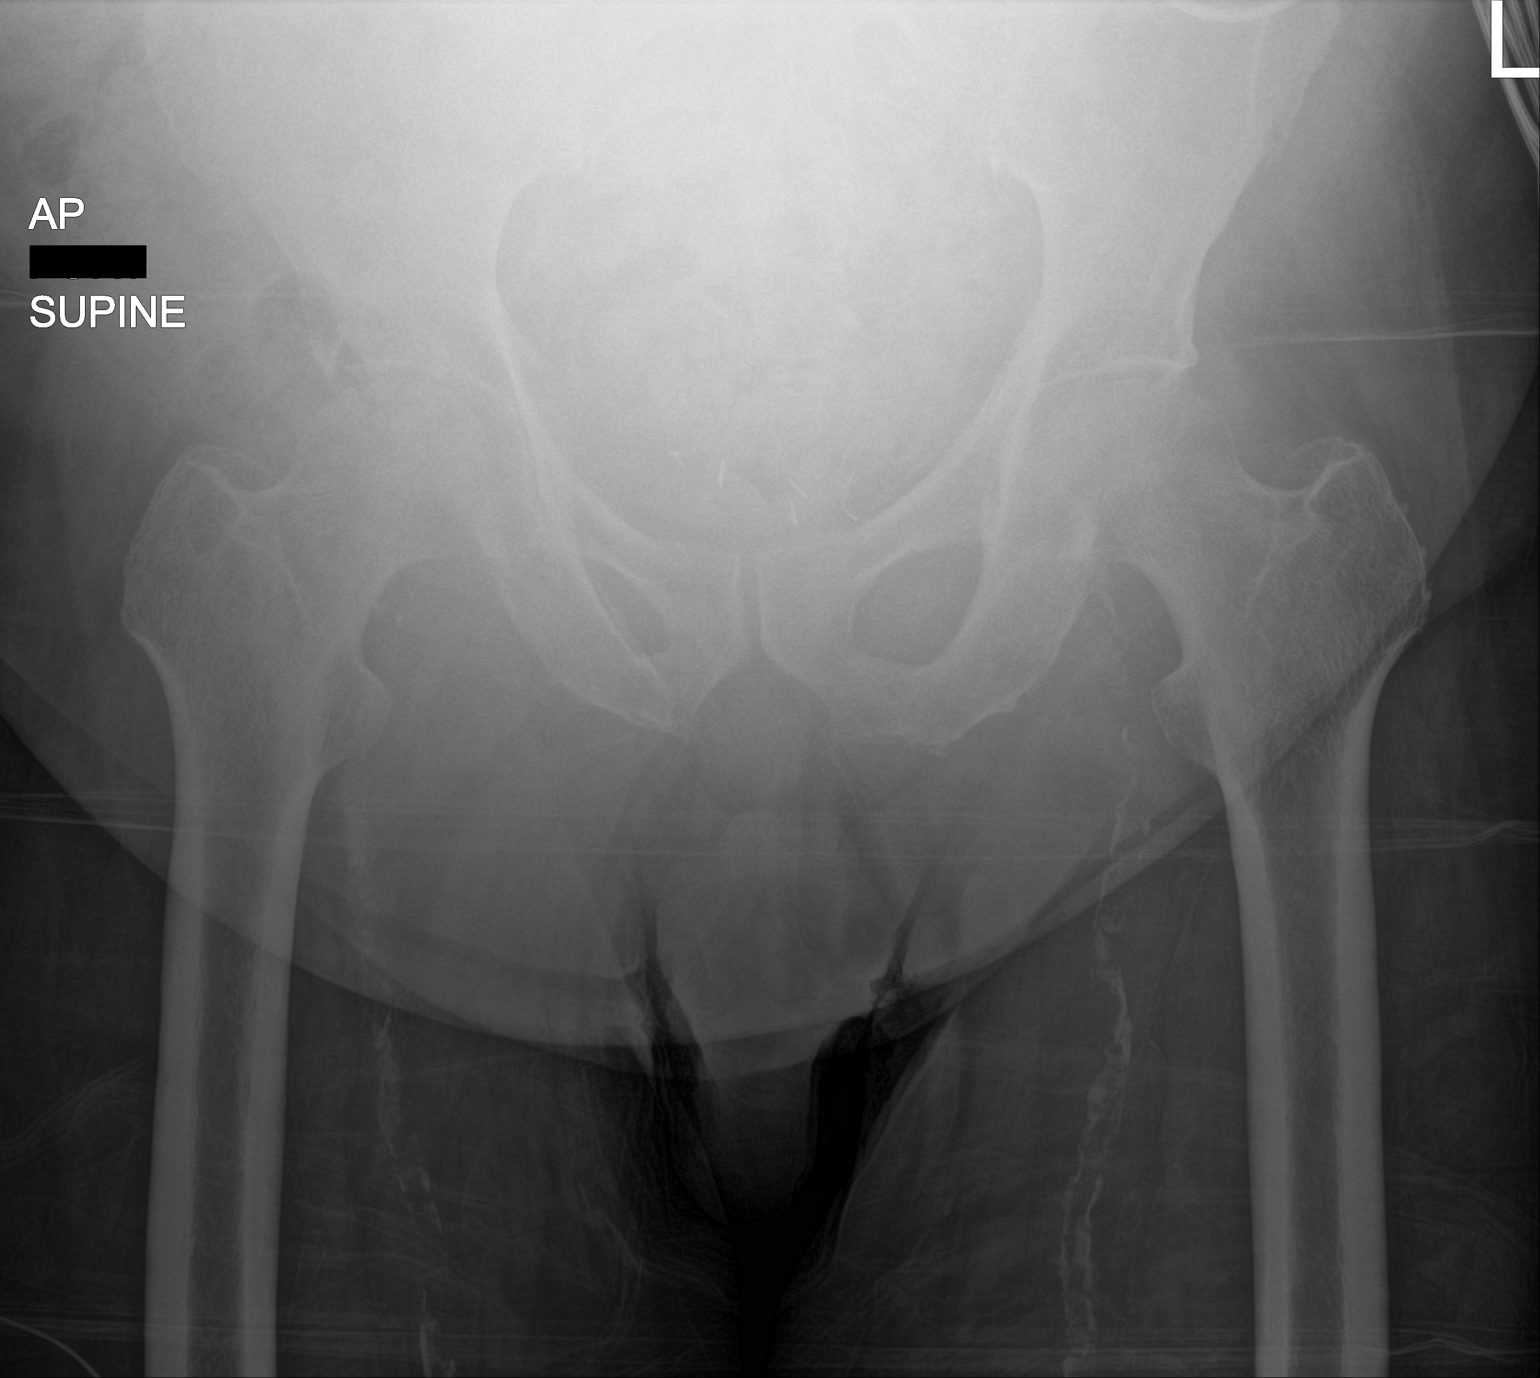

[1 of 1 positions shown; findings below may reference images not displayed]

FINDINGS: There is no evidence of pelvic fracture or diastasis. Mild bilateral
hip osteoarthritis. Atherosclerotic calcifications are noted
involving the femoral arteries. Seed implants noted within the
prostate gland. No pelvic bone lesions are seen.
IMPRESSION: Negative.
